# Patient Record
Sex: Male | Born: 1965 | Race: Black or African American | Hispanic: No | Smoking: Current some day smoker
Health system: Southern US, Community
[De-identification: ages and names within clinical notes are randomized; demographics above are authoritative.]

## PROBLEM LIST (undated history)

## (undated) DIAGNOSIS — I2510 Atherosclerotic heart disease of native coronary artery without angina pectoris: Secondary | ICD-10-CM

## (undated) DIAGNOSIS — E669 Obesity, unspecified: Secondary | ICD-10-CM

## (undated) DIAGNOSIS — G44009 Cluster headache syndrome, unspecified, not intractable: Secondary | ICD-10-CM

## (undated) DIAGNOSIS — E785 Hyperlipidemia, unspecified: Secondary | ICD-10-CM

## (undated) DIAGNOSIS — F1090 Alcohol use, unspecified, uncomplicated: Secondary | ICD-10-CM

## (undated) DIAGNOSIS — K589 Irritable bowel syndrome without diarrhea: Secondary | ICD-10-CM

## (undated) DIAGNOSIS — G43909 Migraine, unspecified, not intractable, without status migrainosus: Secondary | ICD-10-CM

## (undated) DIAGNOSIS — I10 Essential (primary) hypertension: Secondary | ICD-10-CM

## (undated) DIAGNOSIS — R000 Tachycardia, unspecified: Secondary | ICD-10-CM

## (undated) DIAGNOSIS — F209 Schizophrenia, unspecified: Secondary | ICD-10-CM

## (undated) DIAGNOSIS — F419 Anxiety disorder, unspecified: Secondary | ICD-10-CM

## (undated) DIAGNOSIS — G4733 Obstructive sleep apnea (adult) (pediatric): Secondary | ICD-10-CM

## (undated) DIAGNOSIS — K219 Gastro-esophageal reflux disease without esophagitis: Secondary | ICD-10-CM

## (undated) DIAGNOSIS — F32A Depression, unspecified: Secondary | ICD-10-CM

## (undated) DIAGNOSIS — F1910 Other psychoactive substance abuse, uncomplicated: Secondary | ICD-10-CM

## (undated) DIAGNOSIS — Z860100 Personal history of colon polyps, unspecified: Secondary | ICD-10-CM

## (undated) HISTORY — DX: Personal history of colon polyps, unspecified: Z86.0100

## (undated) HISTORY — DX: Gastro-esophageal reflux disease without esophagitis: K21.9

## (undated) HISTORY — DX: Irritable bowel syndrome, unspecified: K58.9

## (undated) HISTORY — DX: Obstructive sleep apnea (adult) (pediatric): G47.33

## (undated) HISTORY — DX: Atherosclerotic heart disease of native coronary artery without angina pectoris: I25.10

## (undated) HISTORY — DX: Schizophrenia, unspecified: F20.9

## (undated) HISTORY — DX: Hyperlipidemia, unspecified: E78.5

## (undated) HISTORY — DX: Depression, unspecified: F32.A

## (undated) HISTORY — DX: Anxiety disorder, unspecified: F41.9

## (undated) HISTORY — PX: WRIST SURGERY: SHX841

## (undated) HISTORY — PX: LOOP RECORDER IMPLANT: SHX5954

## (undated) HISTORY — DX: Alcohol use, unspecified, uncomplicated: F10.90

## (undated) HISTORY — PX: CARDIAC CATHETERIZATION: SHX172

---

## 2003-06-02 ENCOUNTER — Emergency Department (HOSPITAL_COMMUNITY): Admission: EM | Admit: 2003-06-02 | Discharge: 2003-06-02 | Payer: Self-pay | Admitting: Emergency Medicine

## 2004-11-15 ENCOUNTER — Emergency Department (HOSPITAL_COMMUNITY): Admission: EM | Admit: 2004-11-15 | Discharge: 2004-11-15 | Payer: Self-pay | Admitting: Emergency Medicine

## 2004-11-18 ENCOUNTER — Ambulatory Visit (HOSPITAL_COMMUNITY): Admission: RE | Admit: 2004-11-18 | Discharge: 2004-11-18 | Payer: Self-pay | Admitting: Family Medicine

## 2004-11-18 ENCOUNTER — Emergency Department (HOSPITAL_COMMUNITY): Admission: EM | Admit: 2004-11-18 | Discharge: 2004-11-18 | Payer: Self-pay | Admitting: Family Medicine

## 2006-04-09 ENCOUNTER — Emergency Department (HOSPITAL_COMMUNITY): Admission: EM | Admit: 2006-04-09 | Discharge: 2006-04-09 | Payer: Self-pay | Admitting: Emergency Medicine

## 2006-04-12 ENCOUNTER — Emergency Department (HOSPITAL_COMMUNITY): Admission: EM | Admit: 2006-04-12 | Discharge: 2006-04-12 | Payer: Self-pay | Admitting: Emergency Medicine

## 2006-07-14 ENCOUNTER — Encounter (INDEPENDENT_AMBULATORY_CARE_PROVIDER_SITE_OTHER): Payer: Self-pay | Admitting: Internal Medicine

## 2006-07-14 ENCOUNTER — Inpatient Hospital Stay (HOSPITAL_COMMUNITY): Admission: EM | Admit: 2006-07-14 | Discharge: 2006-07-15 | Payer: Self-pay | Admitting: Emergency Medicine

## 2006-08-31 ENCOUNTER — Emergency Department (HOSPITAL_COMMUNITY): Admission: EM | Admit: 2006-08-31 | Discharge: 2006-08-31 | Payer: Self-pay | Admitting: Emergency Medicine

## 2006-10-20 ENCOUNTER — Emergency Department (HOSPITAL_COMMUNITY): Admission: EM | Admit: 2006-10-20 | Discharge: 2006-10-20 | Payer: Self-pay | Admitting: Emergency Medicine

## 2006-11-01 ENCOUNTER — Encounter: Admission: RE | Admit: 2006-11-01 | Discharge: 2006-11-01 | Payer: Self-pay | Admitting: Family Medicine

## 2006-11-23 ENCOUNTER — Emergency Department (HOSPITAL_COMMUNITY): Admission: EM | Admit: 2006-11-23 | Discharge: 2006-11-23 | Payer: Self-pay | Admitting: Family Medicine

## 2006-12-06 ENCOUNTER — Ambulatory Visit (HOSPITAL_BASED_OUTPATIENT_CLINIC_OR_DEPARTMENT_OTHER): Admission: RE | Admit: 2006-12-06 | Discharge: 2006-12-06 | Payer: Self-pay | Admitting: Orthopedic Surgery

## 2007-01-19 ENCOUNTER — Emergency Department (HOSPITAL_COMMUNITY): Admission: EM | Admit: 2007-01-19 | Discharge: 2007-01-19 | Payer: Self-pay | Admitting: Emergency Medicine

## 2007-02-02 ENCOUNTER — Emergency Department (HOSPITAL_COMMUNITY): Admission: EM | Admit: 2007-02-02 | Discharge: 2007-02-02 | Payer: Self-pay | Admitting: Emergency Medicine

## 2007-02-21 ENCOUNTER — Emergency Department (HOSPITAL_COMMUNITY): Admission: EM | Admit: 2007-02-21 | Discharge: 2007-02-21 | Payer: Self-pay | Admitting: Emergency Medicine

## 2007-03-22 ENCOUNTER — Emergency Department (HOSPITAL_COMMUNITY): Admission: EM | Admit: 2007-03-22 | Discharge: 2007-03-22 | Payer: Self-pay | Admitting: Emergency Medicine

## 2007-07-18 ENCOUNTER — Emergency Department (HOSPITAL_COMMUNITY): Admission: EM | Admit: 2007-07-18 | Discharge: 2007-07-18 | Payer: Self-pay | Admitting: Emergency Medicine

## 2007-07-20 ENCOUNTER — Emergency Department (HOSPITAL_COMMUNITY): Admission: EM | Admit: 2007-07-20 | Discharge: 2007-07-21 | Payer: Self-pay | Admitting: Emergency Medicine

## 2007-07-25 ENCOUNTER — Emergency Department (HOSPITAL_COMMUNITY): Admission: EM | Admit: 2007-07-25 | Discharge: 2007-07-25 | Payer: Self-pay | Admitting: Family Medicine

## 2008-06-30 IMAGING — CT CT HEAD W/O CM
1 series · 16 of 30 positions shown, 20 images · non-contrast
Comparison: None

CLINICAL DATA: Left-sided weakness 

HEAD CT WITHOUT CONTRAST
TECHNIQUE: 5mm collimated images were obtained from the base of the skull
through the vertex according to standard protocol without contrast.

[Series 2: head routine 4.8 h47s · axial · 0.46mm/px · z∈[+1320,+1475]mm · 16 of 36 slices shown, 20 images]
[im 2/36  brain]
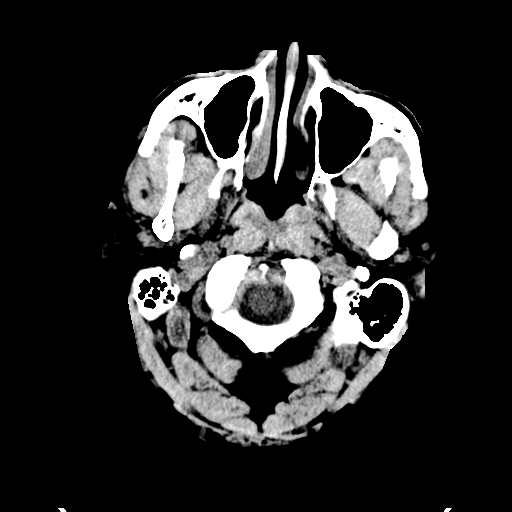
[im 2/36  bone]
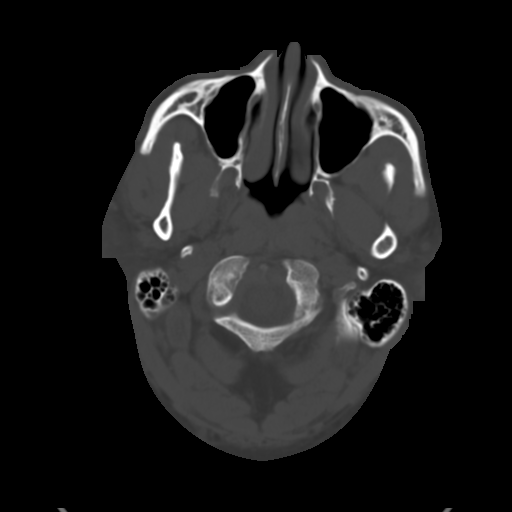
[im 4/36  brain]
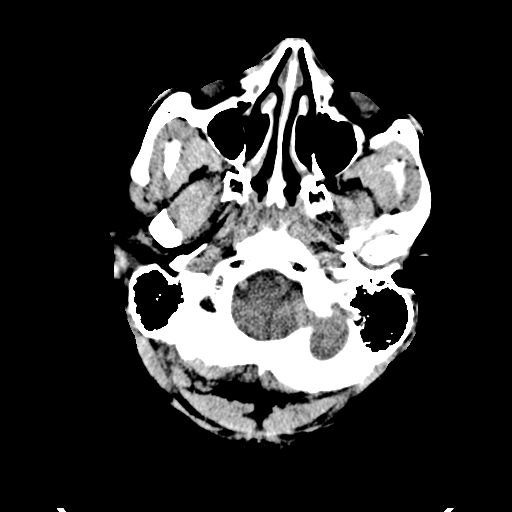
[im 7/36  brain]
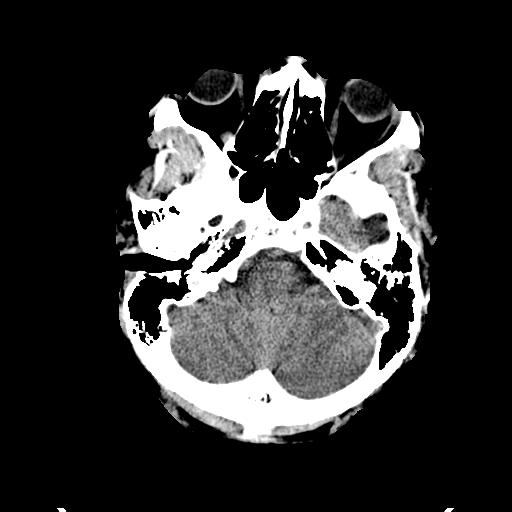
[im 9/36  brain]
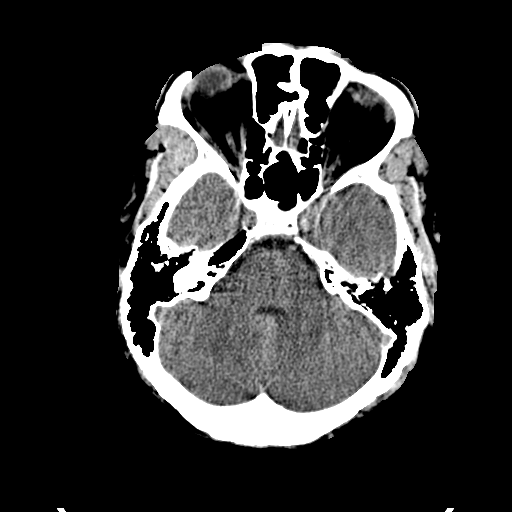
[im 10/36  brain]
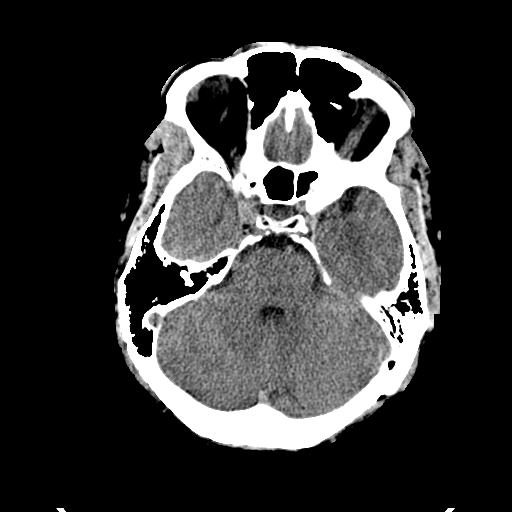
[im 10/36  bone]
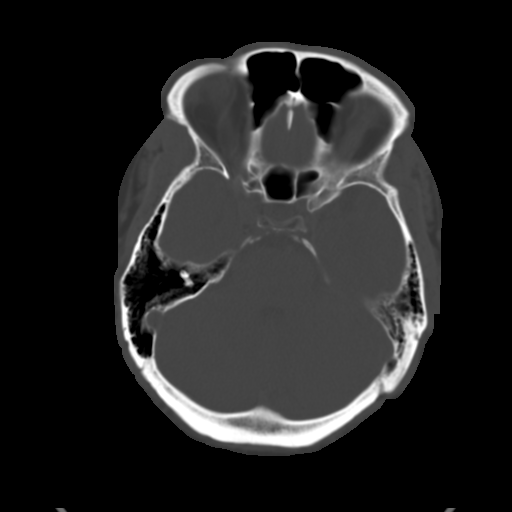
[im 13/36  brain]
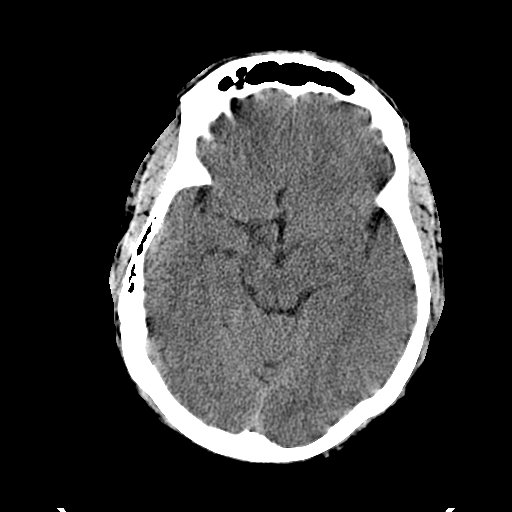
[im 15/36  brain]
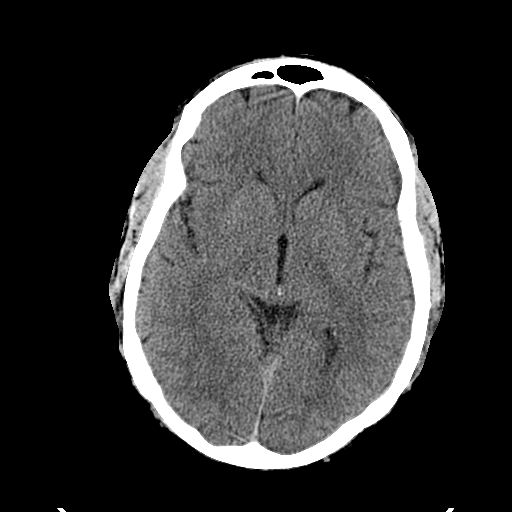
[im 17/36  brain]
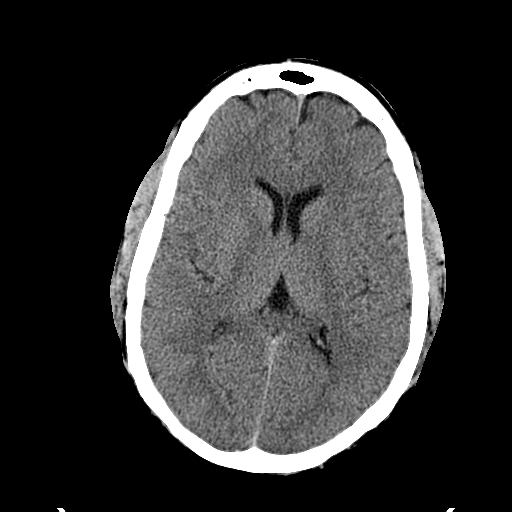
[im 19/36  brain]
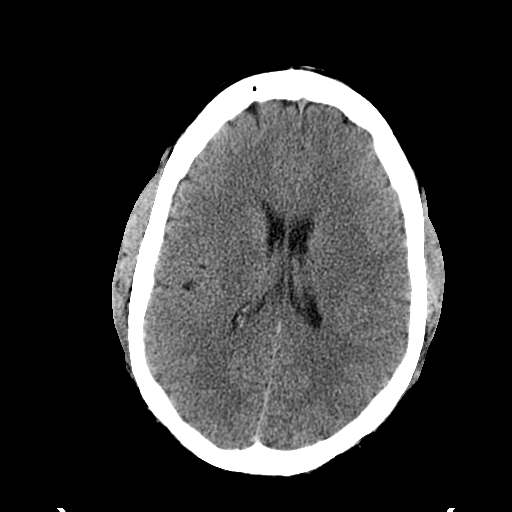
[im 19/36  bone]
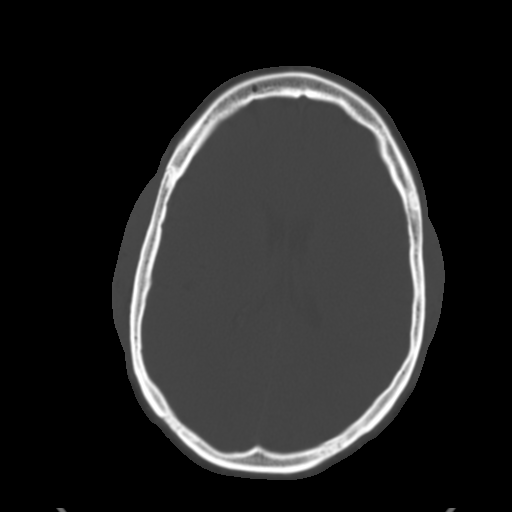
[im 21/36  brain]
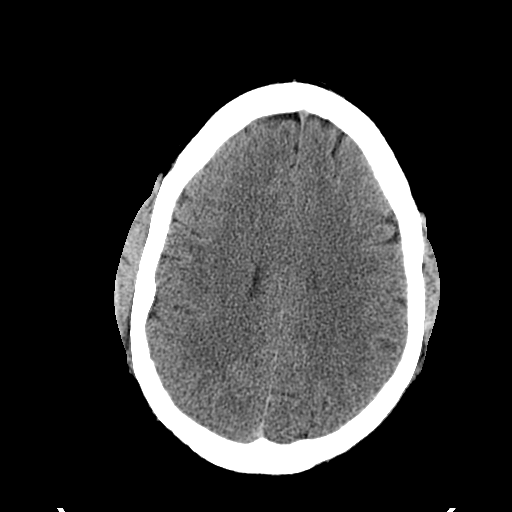
[im 23/36  brain]
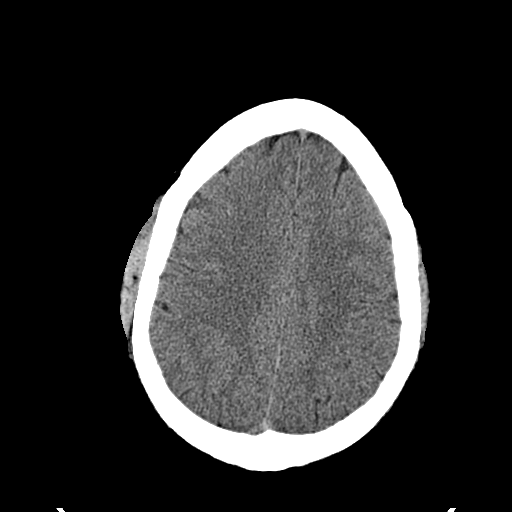
[im 26/36  brain]
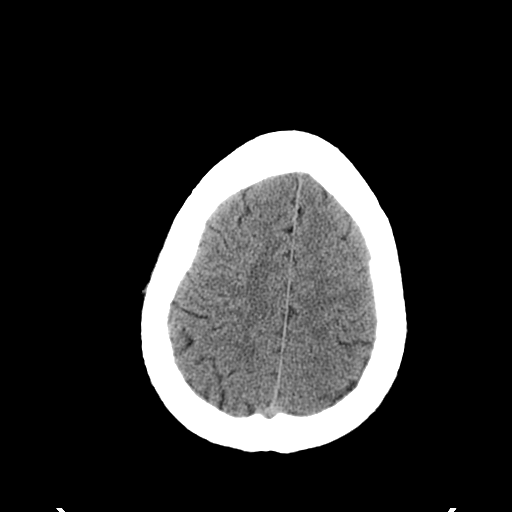
[im 27/36  brain]
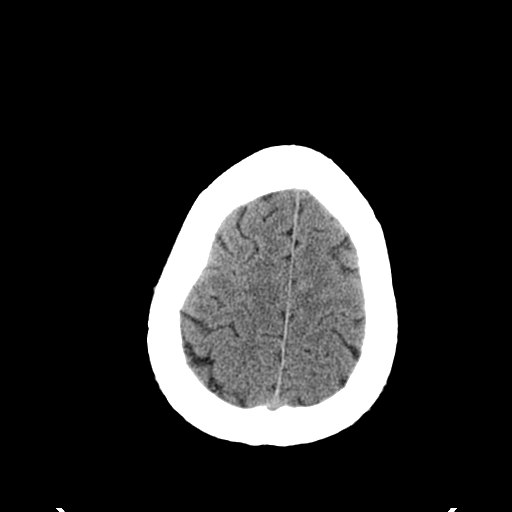
[im 27/36  bone]
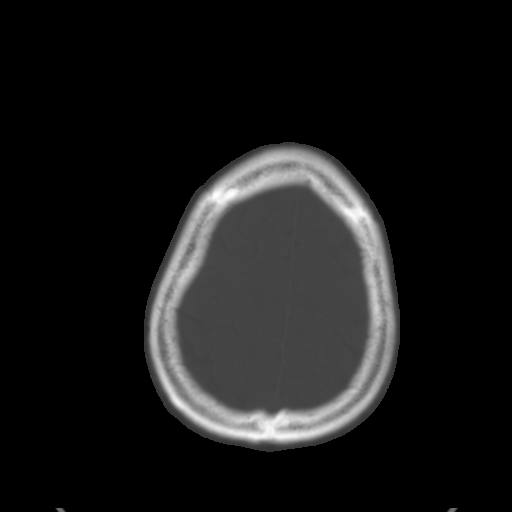
[im 29/36  brain]
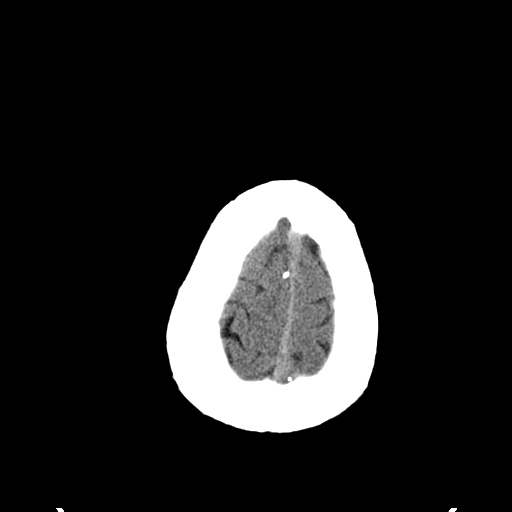
[im 32/36  brain]
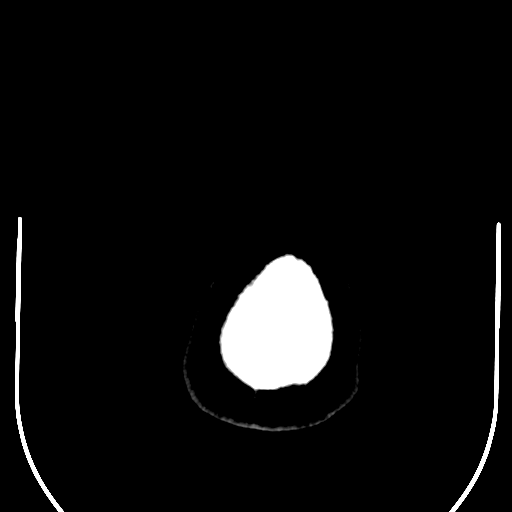
[im 34/36  brain]
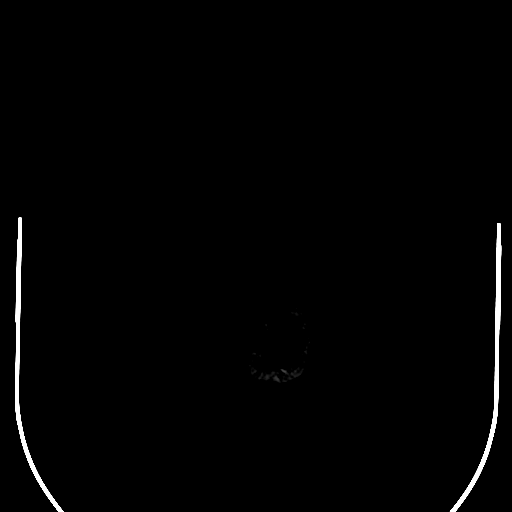

[16 of 30 positions shown; findings below may reference images not displayed]

FINDINGS: The cerebral and cerebellar hemispheres are normal in attenuation and
morphology.

The midline is maintained.

There is no edema or mass-effect.

No abnormal extra-axial fluid collections, intracranial hemorrhage or mass.

Paranasal sinuses and mastoid air cells are normally aerated.

The review of bone windows is unremarkable.

IMPRESSION

Normal brain.

## 2008-06-30 IMAGING — CR DG CHEST 1V PORT
1 series · 1 of 1 positions shown · non-contrast
Comparison: none

CLINICAL DATA: Chest pain

CHEST - 1 VIEW:

[view not recorded]
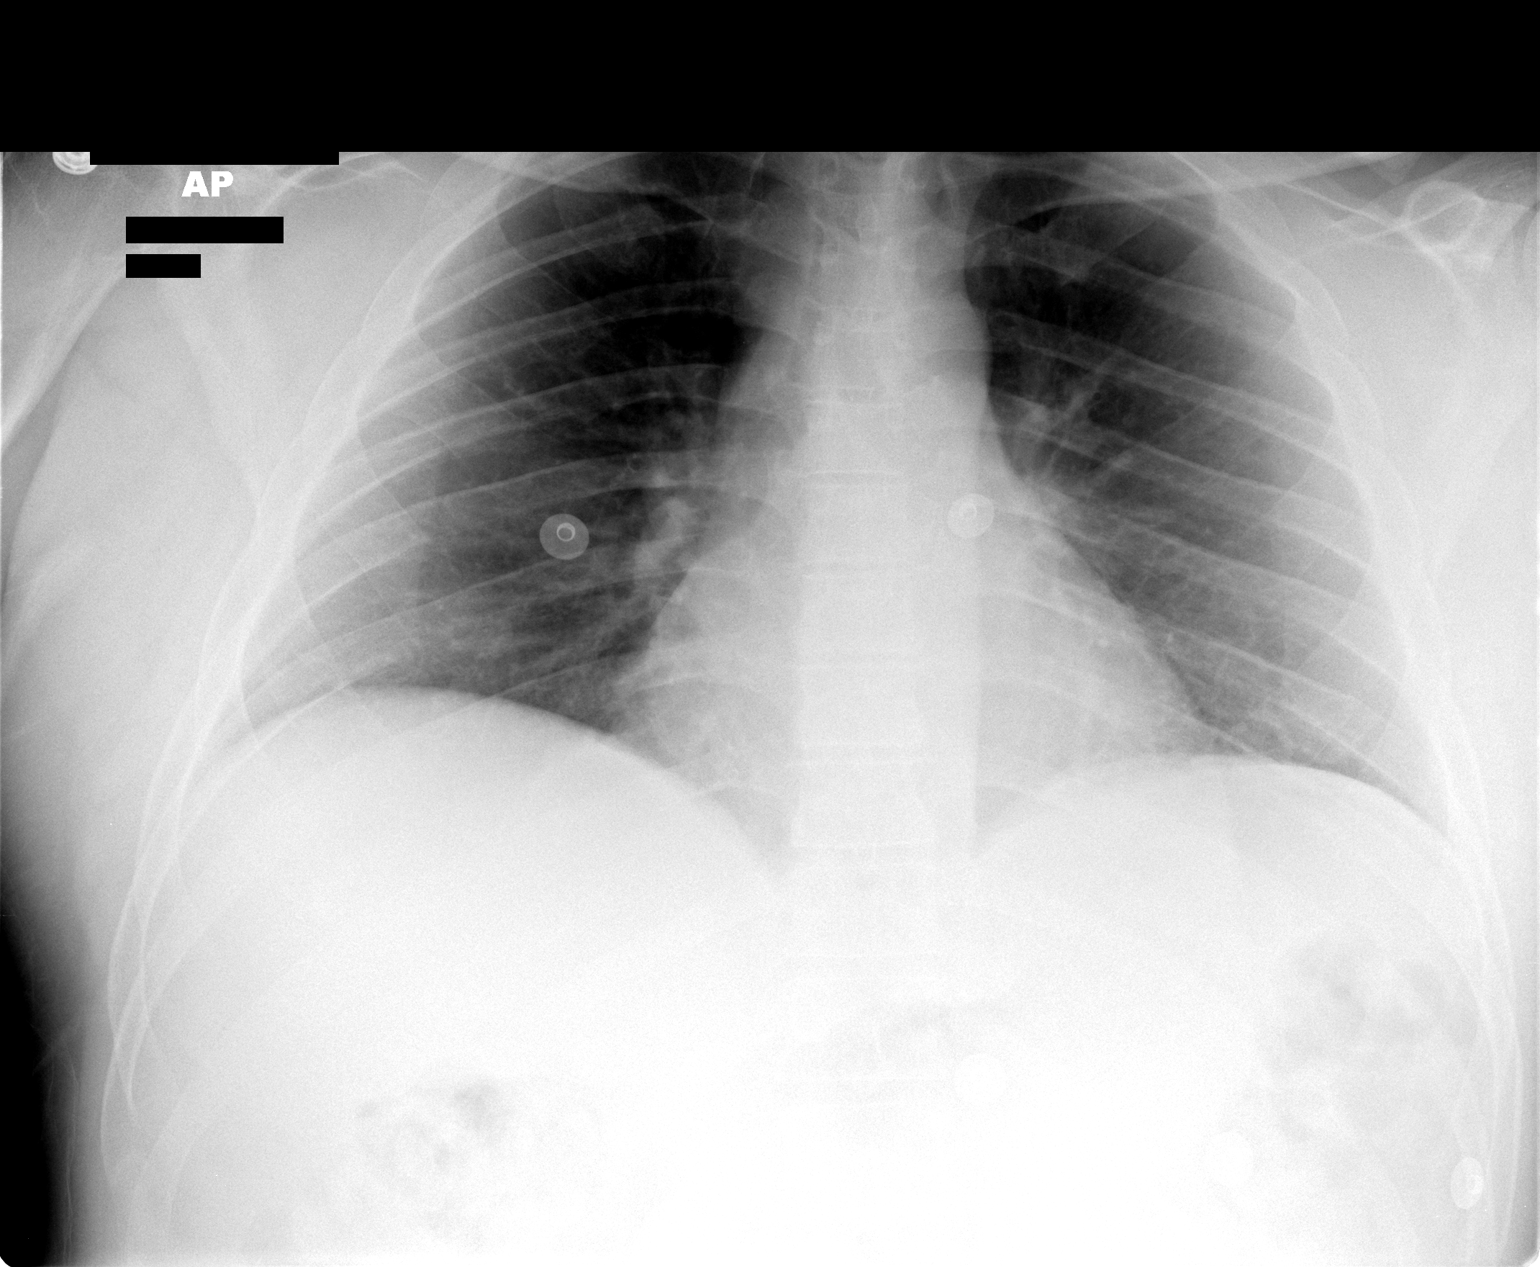

[1 of 1 positions shown; findings below may reference images not displayed]

FINDINGS: The heart size and mediastinal contours are within normal limits. 
Both lungs are clear.
IMPRESSION: No active disease.

## 2010-05-04 ENCOUNTER — Inpatient Hospital Stay (HOSPITAL_COMMUNITY)
Admission: RE | Admit: 2010-05-04 | Discharge: 2010-05-04 | Disposition: A | Discharge status: Home / Self Care | Payer: Self-pay | Source: Ambulatory Visit | Attending: Family Medicine | Admitting: Family Medicine

## 2010-05-11 ENCOUNTER — Inpatient Hospital Stay (INDEPENDENT_AMBULATORY_CARE_PROVIDER_SITE_OTHER)
Admission: RE | Admit: 2010-05-11 | Discharge: 2010-05-11 | Disposition: A | Discharge status: Home / Self Care | Payer: Self-pay | Source: Ambulatory Visit | Attending: Emergency Medicine | Admitting: Emergency Medicine

## 2010-05-11 DIAGNOSIS — G43109 Migraine with aura, not intractable, without status migrainosus: Secondary | ICD-10-CM

## 2010-05-13 ENCOUNTER — Emergency Department (HOSPITAL_COMMUNITY)
Admission: EM | Admit: 2010-05-13 | Discharge: 2010-05-13 | Disposition: A | Discharge status: Home / Self Care | Payer: Self-pay | Attending: Emergency Medicine | Admitting: Emergency Medicine

## 2010-05-13 DIAGNOSIS — X58XXXA Exposure to other specified factors, initial encounter: Secondary | ICD-10-CM | POA: Insufficient documentation

## 2010-05-13 DIAGNOSIS — M546 Pain in thoracic spine: Secondary | ICD-10-CM | POA: Insufficient documentation

## 2010-05-13 DIAGNOSIS — R300 Dysuria: Secondary | ICD-10-CM | POA: Insufficient documentation

## 2010-05-13 DIAGNOSIS — I10 Essential (primary) hypertension: Secondary | ICD-10-CM | POA: Insufficient documentation

## 2010-05-13 DIAGNOSIS — S239XXA Sprain of unspecified parts of thorax, initial encounter: Secondary | ICD-10-CM | POA: Insufficient documentation

## 2010-05-13 DIAGNOSIS — R109 Unspecified abdominal pain: Secondary | ICD-10-CM | POA: Insufficient documentation

## 2010-05-13 LAB — URINALYSIS, ROUTINE W REFLEX MICROSCOPIC
Bilirubin Urine: NEGATIVE
Hgb urine dipstick: NEGATIVE
Nitrite: NEGATIVE
Protein, ur: NEGATIVE mg/dL
Specific Gravity, Urine: 1.027 (ref 1.005–1.030)
Urobilinogen, UA: 1 mg/dL (ref 0.0–1.0)

## 2010-05-13 LAB — POCT I-STAT, CHEM 8
BUN: 16 mg/dL (ref 6–23)
Chloride: 104 mEq/L (ref 96–112)
Creatinine, Ser: 1.2 mg/dL (ref 0.4–1.5)
Glucose, Bld: 101 mg/dL — ABNORMAL HIGH (ref 70–99)
HCT: 48 % (ref 39.0–52.0)
Hemoglobin: 16.3 g/dL (ref 13.0–17.0)
Potassium: 4 mEq/L (ref 3.5–5.1)
Sodium: 143 mEq/L (ref 135–145)
TCO2: 30 mmol/L (ref 0–100)

## 2010-06-16 NOTE — H&P (Signed)
NAME:  Raymond Mcclure, Raymond Mcclure                ACCOUNT NO.:  [id number]   MEDICAL RECORD NO.:  [id number]          PATIENT TYPE:  INP   LOCATION:  3731                         FACILITY:  [facility]   PHYSICIAN:  Herbie Saxon, MDDATE OF BIRTH:  [birth year]   DATE OF ADMISSION:  07/13/2006  DATE OF DISCHARGE:                              HISTORY & PHYSICAL   PRIMARY CARE PHYSICIAN:  Unassigned.   PRESENTING COMPLAINT:  Chest pain 1 day duration.   HISTORY OF PRESENTING COMPLAINT:  This is a 45 year old African American  male who woke up at 9 p.m. yesterday night with severe, 10/10, dull,  retrosternal chest pain, radiating to the left arm and neck, associated  with numbness of the left side.  There was associated nausea,  diaphoresis, lightheadedness, weakness.  Patient has noticed increase in  shortness of breath on exertion over the last 4 weeks, but denies any  body swelling.  He has not had any syncopal episodes.  When the pain  started, he had some palpitations.  He presented to the emergency room  where he was given nitroglycerin and aspirin.  His chest pain has  subsided at present, 1/10.  He states that the chest pain was  intermittent.  He had not had any past history of cardiac problems.  No  cough.  However, he has reflux symptoms intermittently, which he had  been using Zantac for.  Patient has also noticed nocturia in the last 3  weeks.  He has been having to wake up to pass urine about 3-4 times at  night.  No dysuria.  No hematuria.  No frothy urine.   FAMILY HISTORY:  Father had coronary artery disease, bone cancer and  diabetes.  Mother had diabetes.  Maternal aunts and uncles had CVA.   SOCIAL HISTORY:  He is married with 7 children.  He smokes 1/4 pack per  day for more than 15 years.  There is no history of alcohol use.  He  denies any history of drug use.  He works at a group home.   ALLERGIES:  NO KNOWN DRUG ALLERGIES.   MEDICATIONS:  Zantac.   REVIEW OF SYSTEMS:   Twelve systems pertinent positive as above.   PHYSICAL EXAMINATION:  On examination:  GENERAL:  He is a young man, obese, not in acute respiratory distress.  VITAL SIGNS:  Temperature is 98.  Pulse is 81.  Respiratory rate is 20.  Blood pressure 134/79.  HEENT:  Pupils equal and reactive to light and accommodation.  Extraocular muscles are intact.  Oropharynx and his pharynx are clear.  Head is atraumatic, normocephalic.  NECK:  Supple.  No carotid bruit.  No thyromegaly.  No elevated JVD.  CHEST:  Clinically clear.  HEART:  Sounds 1 and 2 regular.  No murmur.  ABDOMEN:  Soft, nontender.  Bowel sounds normoactive.  No organomegaly.  Inguinal orifices are intact.  EXTREMITIES:  Peripheral pulses present  no pedal edema.  No skin rash.  No joint swelling.  NEUROLOGIC:  He is alert and oriented x3.  Cranial nerves II-XII intact.  Power is 5/5  globally.  Sensation is within normal.   AVAILABLE LABS:  WBC is 11.1, hematocrit is 40.0, platelets is 272.  Chemistries:  Sodium is 140, potassium 3.5, chloride 109, bicarbonate  21, BUN 13, creatinine 1.0, glucose is 105.  Troponin is less than 0.05.  D-dimer is 0.23.  EKG shows sinus arrhythmia at 70 per minute with  nonspecific ST/T changes.   IMPRESSION:  1. Chest pain, rule out acute coronary failure.  2. Gastroesophageal reflux disease.  3. Tobacco abuse.  4. Family history of coronary artery disease.  5. Elevated white blood cell count reactive versus a clot infection.  6. Rule out acute on chronic bronchitis.  7. Obesity.   Patient is to be admitted for observation to telemetry.   DIET:  Should be cardiac.   ACTIVITY:  Bed rest.   IV fluid 1/2 normal saline at 20 mL an hour.  He is to have the thyroid  function test, fasting lipid panel, homocystine and serial cardiac  enzymes and an EKG q.8 hours x3.  We checked his 2D echo, possible  schedule for stress test.  Consider cardiology evaluation in the  morning.  We will put him on  Lovenox 40 mg IV subcu daily for deep  venous thrombosis prophylaxis, Phenergan 12.5 mg IV q.8 hours p.r.n.,  enteric-coated aspirin 325 mg daily, Protonix 40 mg IV daily, Duo-Nebs  treatment q.6 hours p.r.n., morphine 2 mg IV q.6 hours p.r.n., O2 2  liter nasal cannula to keep pulse oximetry between 92, Lopressor 2.5 mg  IV q.6 hours p.r.n. if blood pressure greater than 150/109 or heart rate  greater than 109, nitropaste 1/2 inch q.6 hours, Motrin 400 mg q.8 hours  p.r.n., Xanax 0.25 mg p.o. b.i.d., nicotine patch 40 mg per day.  Tobacco cessation counseling.  New medication list and tests explained  to patient.  He verbalizes understanding.      Herbie Saxon, MD  Electronically Signed     MIO/MEDQ  D:  07/14/2006  T:  07/14/2006  Job:  [id number]
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
## 2010-06-16 NOTE — Consult Note (Signed)
NAME:  Raymond Mcclure, Raymond Mcclure NO.:  [id number]   MEDICAL RECORD NO.:  [id number]          PATIENT TYPE:  INP   LOCATION:  3731                         FACILITY:  [facility]   PHYSICIAN:  Peter M. Swaziland, M.D.  DATE OF BIRTH:  [birth year]   DATE OF CONSULTATION:  07/14/2006  DATE OF DISCHARGE:                                 CONSULTATION   HISTORY OF PRESENT ILLNESS:  The patient is a 45 year old black male who  is admitted yesterday evening for evaluation of chest pain.  He states  the pain was in the midsternal area described as a tightness and dull  pain radiating up into his throat and neck. He also had pulsating  sensation in his left arm and leg associated with numbness. Had no  nausea, vomiting, diaphoresis or shortness of breath.  He also  complained of pain around his left orbit and the posterior aspect of his  neck.  There was no change in position, cough or deep breathing. All his  symptoms occurred at rest.  He was admitted last night.  He is ruled out  for myocardial infarction.  However, today he had recurrent chest pain.  He rated as 10/10.  He states it seemed to abate after he was started on  IV nitroglycerin.  He is currently pain free. The patient's primary risk  factors including his family history of heart disease and history of  tobacco use.   PAST MEDICAL HISTORY:  Is significant for gastroesophageal reflux  disease.  He states his blood pressure has been borderline.   ALLERGIES:  He has no known allergies.   PRIOR MEDICATIONS:  Include Zantac.   SOCIAL HISTORY:  The patient is married.  He has seven children.  He  smokes a quarter pack per day.  He denies alcohol or drug use.  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 His wife works at [employer] as an Engineer, production.   FAMILY HISTORY:  Father had history of coronary disease, myocardial  infarction.  Mother has a history of diabetes.  He does have several  aunts and uncles who have had strokes.   REVIEW OF  SYSTEMS:  Is otherwise unremarkable.   PHYSICAL EXAM:  GENERAL:  The patient is young black male in no apparent  distress.  VITAL SIGNS:  His blood pressure is 117/69, pulse 67 and regular.  He is  afebrile and his saturations are 98% on room air.  HEENT:  Unremarkable.  He has no jugular venous distension or bruits.  LUNGS:  Clear.  CARDIAC:  Exam reveals regular rate and rhythm without gallop, murmur,  rub or click.  ABDOMEN:  Soft, nontender.  He has no hepatosplenomegaly, masses or  bruits.  Femoral and pedal pulses 2+ and symmetric.  He has no edema or  phlebitis.  NEUROLOGIC:  Exam is nonfocal.  Do not elicit any chest wall tenderness  to palpation.   LABORATORY DATA:  He had a head CT on admission which was normal.  CHEST  X-RAY:  Shows no active disease.  He has had multiple cardiac enzymes  done which were all normal. D-dimer level was 0.25 and BMET was normal  except for glucose of 110.  CBC was normal.  TSH was normal.  BNP level  was less than 30.   IMPRESSION:  1. Atypical chest pain.  2. Family history of coronary disease.  3. History of tobacco use.  4. Gastroesophageal reflux disease.   PLAN:  Would recommend checking a lipid panel. The patient had an  echocardiogram today.  We will review these results.  We will schedule  him for a stress Cardiolite study in the morning to rule out significant  coronary disease although I think it is unlikely his symptoms or cardiac  related.           ______________________________  Peter M. Sw[place], M.D.     PMJ/MEDQ  D:  07/14/2006  T:  07/15/2006  Job:  [id number]   cc:   Herbie Saxon, MD

## 2010-08-27 ENCOUNTER — Emergency Department (HOSPITAL_COMMUNITY)
Admission: EM | Admit: 2010-08-27 | Discharge: 2010-08-28 | Disposition: A | Discharge status: Home / Self Care | Payer: Self-pay | Attending: Emergency Medicine | Admitting: Emergency Medicine

## 2010-08-27 DIAGNOSIS — M79609 Pain in unspecified limb: Secondary | ICD-10-CM | POA: Insufficient documentation

## 2010-08-27 DIAGNOSIS — I10 Essential (primary) hypertension: Secondary | ICD-10-CM | POA: Insufficient documentation

## 2010-08-27 DIAGNOSIS — R070 Pain in throat: Secondary | ICD-10-CM | POA: Insufficient documentation

## 2010-08-27 DIAGNOSIS — IMO0001 Reserved for inherently not codable concepts without codable children: Secondary | ICD-10-CM | POA: Insufficient documentation

## 2010-08-27 DIAGNOSIS — R05 Cough: Secondary | ICD-10-CM | POA: Insufficient documentation

## 2010-08-27 DIAGNOSIS — R630 Anorexia: Secondary | ICD-10-CM | POA: Insufficient documentation

## 2010-08-27 DIAGNOSIS — R059 Cough, unspecified: Secondary | ICD-10-CM | POA: Insufficient documentation

## 2010-08-27 DIAGNOSIS — R209 Unspecified disturbances of skin sensation: Secondary | ICD-10-CM | POA: Insufficient documentation

## 2010-08-27 LAB — URINALYSIS, ROUTINE W REFLEX MICROSCOPIC
Bilirubin Urine: NEGATIVE
Glucose, UA: NEGATIVE mg/dL
Ketones, ur: NEGATIVE mg/dL
Nitrite: NEGATIVE
Protein, ur: NEGATIVE mg/dL
Specific Gravity, Urine: 1.032 — ABNORMAL HIGH (ref 1.005–1.030)
Urobilinogen, UA: 1 mg/dL (ref 0.0–1.0)

## 2010-08-27 LAB — URINE MICROSCOPIC-ADD ON

## 2010-08-28 LAB — CBC
HCT: 39.1 % (ref 39.0–52.0)
Hemoglobin: 13.2 g/dL (ref 13.0–17.0)
MCH: 30.4 pg (ref 26.0–34.0)
MCHC: 33.8 g/dL (ref 30.0–36.0)
MCV: 90.1 fL (ref 78.0–100.0)
Platelets: 256 10*3/uL (ref 150–400)
RBC: 4.34 MIL/uL (ref 4.22–5.81)
RDW: 12.2 % (ref 11.5–15.5)
WBC: 8.6 10*3/uL (ref 4.0–10.5)

## 2010-08-28 LAB — DIFFERENTIAL
Eosinophils Absolute: 0.2 10*3/uL (ref 0.0–0.7)
Eosinophils Relative: 3 % (ref 0–5)
Lymphocytes Relative: 39 % (ref 12–46)
Lymphs Abs: 3.4 10*3/uL (ref 0.7–4.0)
Monocytes Absolute: 0.7 10*3/uL (ref 0.1–1.0)
Monocytes Relative: 8 % (ref 3–12)
Neutro Abs: 4.3 10*3/uL (ref 1.7–7.7)
Neutrophils Relative %: 50 % (ref 43–77)

## 2010-08-28 LAB — CK: Total CK: 345 U/L — ABNORMAL HIGH (ref 7–232)

## 2010-08-28 LAB — BASIC METABOLIC PANEL
CO2: 23 mEq/L (ref 19–32)
Calcium: 9.2 mg/dL (ref 8.4–10.5)
GFR calc non Af Amer: >60 mL/min (ref >60)
Glucose, Bld: 99 mg/dL (ref 70–99)
Potassium: 3.7 mEq/L (ref 3.5–5.1)
Sodium: 137 mEq/L (ref 135–145)

## 2010-10-29 LAB — URINALYSIS, ROUTINE W REFLEX MICROSCOPIC
Ketones, ur: 15 — AB
Nitrite: NEGATIVE
Specific Gravity, Urine: 1.04 — ABNORMAL HIGH
Urobilinogen, UA: 1

## 2010-10-29 LAB — CULTURE, ROUTINE-ABSCESS

## 2010-10-29 LAB — RAPID STREP SCREEN (MED CTR MEBANE ONLY): Streptococcus, Group A Screen (Direct): NEGATIVE

## 2010-11-10 LAB — POCT HEMOGLOBIN-HEMACUE: Hemoglobin: 15.5

## 2010-11-19 LAB — POCT CARDIAC MARKERS
CKMB, poc: 1.2
Myoglobin, poc: 89.5
Operator id: 189501
Troponin i, poc: <0.05

## 2010-11-19 LAB — D-DIMER, QUANTITATIVE
D-Dimer, Quant: 0.23
D-Dimer, Quant: 0.25

## 2010-11-19 LAB — CBC
HCT: 40.5
MCHC: 33.3
MCHC: 33.8
MCHC: 34
MCV: 94.6
MCV: 94.9
Platelets: 261
Platelets: 272
RBC: 4.22
RDW: 12.9
WBC: 8.3

## 2010-11-19 LAB — COMPREHENSIVE METABOLIC PANEL
ALT: 24
Alkaline Phosphatase: 86
Chloride: 110
Glucose, Bld: 86
Potassium: 4
Sodium: 142
Total Protein: 5.7 — ABNORMAL LOW

## 2010-11-19 LAB — CARDIAC PANEL(CRET KIN+CKTOT+MB+TROPI)
CK, MB: 2
Relative Index: 1
Total CK: 191
Total CK: 211
Troponin I: 0.01
Troponin I: 0.03

## 2010-11-19 LAB — BASIC METABOLIC PANEL
Calcium: 8.9
Chloride: 106
Creatinine, Ser: 1.01
GFR calc Af Amer: >60

## 2010-11-19 LAB — I-STAT 8, (EC8 V) (CONVERTED LAB)
Acid-base deficit: 2
BUN: 13
Bicarbonate: 21.7
HCT: 41
Operator id: 189501
TCO2: 23
pCO2, Ven: 33.1 — ABNORMAL LOW

## 2010-11-19 LAB — POCT I-STAT CREATININE: Creatinine, Ser: 1

## 2010-11-19 LAB — MAGNESIUM: Magnesium: 2

## 2010-11-19 LAB — TROPONIN I: Troponin I: 0.03

## 2010-11-19 LAB — CK TOTAL AND CKMB (NOT AT ARMC): CK, MB: 3

## 2010-11-19 LAB — DIFFERENTIAL
Basophils Absolute: 0.1
Basophils Relative: 1
Eosinophils Absolute: 0.2
Eosinophils Relative: 2
Neutrophils Relative %: 59

## 2010-11-19 LAB — HOMOCYSTEINE: Homocysteine: 8.3

## 2010-11-19 LAB — B-NATRIURETIC PEPTIDE (CONVERTED LAB): Pro B Natriuretic peptide (BNP): <30

## 2010-11-19 LAB — LIPID PANEL
Cholesterol: 147
Total CHOL/HDL Ratio: 5.3

## 2010-11-19 LAB — APTT: aPTT: 31

## 2012-10-21 ENCOUNTER — Encounter (HOSPITAL_COMMUNITY): Payer: Self-pay | Admitting: Emergency Medicine

## 2012-10-21 ENCOUNTER — Emergency Department (HOSPITAL_COMMUNITY)
Admission: EM | Admit: 2012-10-21 | Discharge: 2012-10-21 | Disposition: A | Discharge status: Home / Self Care | Payer: Self-pay | Attending: Emergency Medicine | Admitting: Emergency Medicine

## 2012-10-21 DIAGNOSIS — M542 Cervicalgia: Secondary | ICD-10-CM | POA: Insufficient documentation

## 2012-10-21 DIAGNOSIS — F17200 Nicotine dependence, unspecified, uncomplicated: Secondary | ICD-10-CM | POA: Insufficient documentation

## 2012-10-21 DIAGNOSIS — K118 Other diseases of salivary glands: Secondary | ICD-10-CM | POA: Insufficient documentation

## 2012-10-21 DIAGNOSIS — Z79899 Other long term (current) drug therapy: Secondary | ICD-10-CM | POA: Insufficient documentation

## 2012-10-21 MED ORDER — NAPROXEN 500 MG PO TABS: 500.0000 mg | ORAL_TABLET | Freq: Two times a day (BID) | ORAL | Status: DC

## 2012-10-21 MED ORDER — DIPHENHYDRAMINE HCL 25 MG PO CAPS: 25.0000 mg | ORAL_CAPSULE | Freq: Once | ORAL | Status: DC

## 2012-10-21 MED ORDER — HYDROCODONE-ACETAMINOPHEN 5-325 MG PO TABS: ORAL_TABLET | ORAL | Status: DC

## 2012-10-21 MED ORDER — FAMOTIDINE 20 MG PO TABS: 20.0000 mg | ORAL_TABLET | Freq: Once | ORAL | Status: DC

## 2012-10-21 MED ORDER — AMOXICILLIN-POT CLAVULANATE 875-125 MG PO TABS: 1.0000 | ORAL_TABLET | Freq: Two times a day (BID) | ORAL | Status: DC

## 2012-10-21 NOTE — ED Provider Notes (Signed)
CSN: [id number]     Arrival date & time 10/21/12  0714 History   First MD Initiated Contact with Patient 10/21/12 0720     Chief Complaint  Patient presents with  . Facial Swelling   (Consider location/radiation/quality/duration/timing/severity/associated sxs/prior Treatment) HPI Comments: Pt with h/o allergy to tramadol, no h/o DM, on no HTN meds -- presents with L sided neck swelling. He first noticed last evening. Describes swelling as feeling sore. States he was having some problems swallowing (no regurgitation) and breathing last night but he drank some warm tea that allowed him to sleep. After eating fruit this morning, he felt like the swelling and soreness was acutely worse so he came to the ED for eval. No pain with movement of neck. No history of similar allergy. No fever, N/V. No CP. The onset of this condition was acute. The course is constant. Aggravating factors: none. Alleviating factors: none.    The history is provided by the patient.    History reviewed. No pertinent past medical history. History reviewed. No pertinent past surgical history. No family history on file. History  Substance Use Topics  . Smoking status: Current Every Day Smoker  . Smokeless tobacco: Not on file  . Alcohol Use: No    Review of Systems  Constitutional: Negative for fever.  HENT: Positive for neck pain (sore). Negative for facial swelling and trouble swallowing.   Eyes: Negative for redness.  Respiratory: Negative for shortness of breath, wheezing and stridor.   Cardiovascular: Negative for chest pain.  Gastrointestinal: Negative for nausea and vomiting.  Musculoskeletal: Negative for myalgias.  Skin: Negative for rash.  Neurological: Negative for light-headedness.  Psychiatric/Behavioral: Negative for confusion.    Allergies  Tramadol  Home Medications   Current Outpatient Rx  Name  Route  Sig  Dispense  Refill  . amoxicillin-clavulanate (AUGMENTIN) 875-125 MG per tablet  Oral   Take 1 tablet by mouth every 12 (twelve) hours.   14 tablet   0   . HYDROcodone-acetaminophen (NORCO/VICODIN) 5-325 MG per tablet      Take 1-2 tablets every 6 hours as needed for severe pain   6 tablet   0   . naproxen (NAPROSYN) 500 MG tablet   Oral   Take 1 tablet (500 mg total) by mouth 2 (two) times daily.   20 tablet   0    BP 127/88  Pulse 88  Temp(Src) 97.5 F (36.4 C) (Oral)  Resp 20  SpO2 98% Physical Exam  Nursing note and vitals reviewed. Constitutional: He appears well-developed and well-nourished.  HENT:  Head: Normocephalic and atraumatic.  Eyes: Conjunctivae are normal. Right eye exhibits no discharge. Left eye exhibits no discharge.  Neck: Normal range of motion. Neck supple.  Fullness and mild tenderness to left upper neck and inferior to L jaw. No overlying erythema. Full ROM of neck.   Cardiovascular: Normal rate, regular rhythm and normal heart sounds.   Pulmonary/Chest: Effort normal and breath sounds normal. No stridor.  Abdominal: Soft. There is no tenderness.  Neurological: He is alert.  Skin: Skin is warm and dry.  Psychiatric: He has a normal mood and affect.    ED Course  Procedures (including critical care time) Labs Review Labs Reviewed - No data to display Imaging Review No results found.  7:30 AM Patient seen and examined. Work-up initiated. Suspected swollen salivary gland.   Vital signs reviewed and are as follows: Filed Vitals:   10/21/12 0719  BP: 127/88  Pulse: 88  Temp: 97.5 F (36.4 C)  Resp: 20   8:10 AM Pt stable. Discussed with and seen by Dr. Fayrene Fearing who agrees with assessment. Pt counseled on eating sour candies and likely slow resolution of symptoms. Will discharge home with pain medication, Augmentin, naproxen, ENT followup if not improved in one week.  Patient told to return with trouble breathing or inability to swallow.  MDM   1. Salivary gland obstruction    Patient with swollen salivary gland,  tenderness to palpation, painful swallowing and chewing. Do not suspect allergic reaction, anaphylaxis. He is in no respiratory distress and can swallow without difficulty in emergency department. Conservative management indicated. No systemic signs of infection however will give course of Augmentin as infection cannot be ruled out as causing obstruction. No concern for Ludwig's angina or retropharyngeal abscess given history, risk factors, and normal movement of neck.    Renne Crigler, PA-C 10/21/12 1141

## 2012-10-21 NOTE — ED Provider Notes (Signed)
Pt seen and examined.  Pain and swelling below lt jaw for 2 days.  Exam shows well localized tenderness over palpable sub-mandibular gland.  No central anterior neck pain.  Comfortable breathing.  Hurts to chew and swallow. Agree with Dx and treatment Sialadenitis.  Roney Marion, MD 10/21/12 [phone]

## 2012-10-22 NOTE — ED Provider Notes (Signed)
Medical screening examination/treatment/procedure(s) were conducted as a shared visit with non-physician practitioner(s) and myself.  I personally evaluated the patient during the encounter  Patient reports symptoms over the last few days. Has a tender swollen area just underneath the ramus of the mandible slightly anterior. His exam is consistent with a maxillary sialoadenitis. Treatment  will be Pain medicines, sialagogues, antibiotics.  Roney Marion, MD 10/22/12 [phone]

## 2012-12-04 ENCOUNTER — Emergency Department (HOSPITAL_COMMUNITY)
Admission: EM | Admit: 2012-12-04 | Discharge: 2012-12-04 | Disposition: A | Discharge status: Home / Self Care | Payer: Self-pay | Attending: Emergency Medicine | Admitting: Emergency Medicine

## 2012-12-04 ENCOUNTER — Encounter (HOSPITAL_COMMUNITY): Payer: Self-pay | Admitting: Emergency Medicine

## 2012-12-04 DIAGNOSIS — X12XXXA Contact with other hot fluids, initial encounter: Secondary | ICD-10-CM | POA: Insufficient documentation

## 2012-12-04 DIAGNOSIS — R209 Unspecified disturbances of skin sensation: Secondary | ICD-10-CM | POA: Insufficient documentation

## 2012-12-04 DIAGNOSIS — Y9389 Activity, other specified: Secondary | ICD-10-CM | POA: Insufficient documentation

## 2012-12-04 DIAGNOSIS — Y929 Unspecified place or not applicable: Secondary | ICD-10-CM | POA: Insufficient documentation

## 2012-12-04 DIAGNOSIS — T23209A Burn of second degree of unspecified hand, unspecified site, initial encounter: Secondary | ICD-10-CM | POA: Insufficient documentation

## 2012-12-04 DIAGNOSIS — F17200 Nicotine dependence, unspecified, uncomplicated: Secondary | ICD-10-CM | POA: Insufficient documentation

## 2012-12-04 DIAGNOSIS — T23202A Burn of second degree of left hand, unspecified site, initial encounter: Secondary | ICD-10-CM

## 2012-12-04 MED ORDER — SILVER SULFADIAZINE 1 % EX CREA
TOPICAL_CREAM | Freq: Once | CUTANEOUS | Status: AC
  Administered 2012-12-04: 1 via TOPICAL
  Filled 2012-12-04: qty 85
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
## 2012-12-04 NOTE — ED Provider Notes (Signed)
CSN: [id number]     Arrival date & time 12/04/12  1627 History  This chart was scribed for non-physician practitioner, Sharilyn Sites, PA-C working with Richardean Canal, MD by Greggory Stallion, ED scribe. This patient was seen in room TR07C/TR07C and the patient's care was started at 6:35 PM.   Chief Complaint  Patient presents with  . Hand Burn   The history is provided by the patient. No language interpreter was used.   HPI Comments: Raymond Mcclure is a 47 y.o. male who presents to the Emergency Department complaining of a burn to his left wrist that occurred yesterday. Pt states he was working on his car when the water hose burst on his left wrist.  Pt states he had 3 blisters, the largest of which popped today-- states he just wanted it to be checked out.  States there is some intermittent numbness and paresthesias surrounding largest burn.  Denies any drainage.  Has been keeping area clean and applying neosporin.  Denies any fevers, sweats, or chills.  History reviewed. No pertinent past medical history. Past Surgical History  Procedure Laterality Date  . Wrist surgery      nerve repair   No family history on file. History  Substance Use Topics  . Smoking status: Current Every Day Smoker -- 0.15 packs/day    Types: Cigarettes  . Smokeless tobacco: Not on file  . Alcohol Use: No    Review of Systems  Skin: Positive for wound (burn).  Neurological: Positive for numbness.  All other systems reviewed and are negative.    Allergies  Tramadol  Home Medications  No current outpatient prescriptions on file.  BP 126/81  Pulse 92  Temp(Src) 98.4 F (36.9 C) (Oral)  Resp 18  Ht 5\' 10"  (1.778 m)  Wt 249 lb (112.946 kg)  BMI 35.73 kg/m2  SpO2 97%  Physical Exam  Nursing note and vitals reviewed. Constitutional: He is oriented to person, place, and time. He appears well-developed and well-nourished. No distress.  HENT:  Head: Normocephalic and atraumatic.  Mouth/Throat:  Oropharynx is clear and moist.  Eyes: Conjunctivae and EOM are normal. Pupils are equal, round, and reactive to light.  Neck: Normal range of motion. Neck supple.  Cardiovascular: Normal rate, regular rhythm and normal heart sounds.   Capillary refill less than 3 seconds.  Pulmonary/Chest: Effort normal and breath sounds normal. No respiratory distress. He has no wheezes.  Musculoskeletal: Normal range of motion.  Neurological: He is alert and oriented to person, place, and time.  Skin: Skin is warm and dry. Burn noted. He is not diaphoretic.  4 cm x 1 cm second degree burn to left thenar eminence. Margins clean, underlying tissue pink, no drainage or signs of infection; 2 large blisters along radial aspect of left wrist  Psychiatric: He has a normal mood and affect.    ED Course  Procedures (including critical care time)  DIAGNOSTIC STUDIES: Oxygen Saturation is 97% on RA, normal by my interpretation.    COORDINATION OF CARE: 6:37 PM-Discussed treatment plan which includes silvadene with pt at bedside and pt agreed to plan.   Labs Review Labs Reviewed - No data to display Imaging Review No results found.  EKG Interpretation   None       MDM   1. Second degree burn of hand, left, initial encounter    Second degree burn without signs of infection. Silvadene cream applied and wound bandaged in the ED-- instructed to continue daily wound care  at home.  Advised not to pop other blisters.  Discussed plan with pt, he agreed.  Strict return precautions advised for signs of infection including increased redness, warmth, drainage, etc.    I personally performed the services described in this documentation, which was scribed in my presence. The recorded information has been reviewed and is accurate.  Garlon Hatchet, PA-C 12/04/12 2311

## 2012-12-04 NOTE — ED Provider Notes (Signed)
Medical screening examination/treatment/procedure(s) were performed by non-physician practitioner and as supervising physician I was immediately available for consultation/collaboration.  EKG Interpretation   None         Richardean Canal, MD 12/04/12 2316

## 2012-12-04 NOTE — ED Notes (Signed)
Patient suffered a hot water burn to his left hand.  There is a large open area at the base of his left thumb 4 X 2 cm.  The wound bed is pink. There are two blister on his left wrist that are still intact.

## 2013-02-21 ENCOUNTER — Encounter (HOSPITAL_COMMUNITY): Payer: Self-pay | Admitting: Emergency Medicine

## 2013-02-21 ENCOUNTER — Emergency Department (INDEPENDENT_AMBULATORY_CARE_PROVIDER_SITE_OTHER)
Admission: EM | Admit: 2013-02-21 | Discharge: 2013-02-21 | Disposition: A | Discharge status: Home / Self Care | Payer: 59 | Source: Home / Self Care | Attending: Family Medicine | Admitting: Family Medicine

## 2013-02-21 DIAGNOSIS — H9202 Otalgia, left ear: Secondary | ICD-10-CM

## 2013-02-21 DIAGNOSIS — R590 Localized enlarged lymph nodes: Secondary | ICD-10-CM

## 2013-02-21 DIAGNOSIS — H9209 Otalgia, unspecified ear: Secondary | ICD-10-CM

## 2013-02-21 DIAGNOSIS — R599 Enlarged lymph nodes, unspecified: Secondary | ICD-10-CM

## 2013-02-21 MED ORDER — ANTIPYRINE-BENZOCAINE 5.4-1.4 % OT SOLN
3.0000 [drp] | OTIC | Status: DC | PRN
  Administered 2013-02-21: 3 [drp] via OTIC

## 2013-02-21 MED ORDER — DICLOFENAC SODIUM 75 MG PO TBEC: 75.0000 mg | DELAYED_RELEASE_TABLET | Freq: Two times a day (BID) | ORAL | Status: DC | PRN

## 2013-02-21 NOTE — ED Notes (Signed)
Dr. Denyse Amassorey is in the room w/the pt.  C/o knot on left side of head/ear w/sxs that include: HA He is alert w/no signs of acute distress.

## 2013-02-21 NOTE — Discharge Instructions (Signed)
Thank you for coming in today. Take diclofenac twice daily as needed for pain. Use the ear drops every 2 hours as needed for pain. If you're not getting better please return to the urgent care or followup with your doctor. Call or go to the emergency room if you get worse, have trouble breathing, have chest pains, or palpitations.

## 2013-02-21 NOTE — ED Provider Notes (Signed)
Raymond Mcclure is a 48 y.o. male who presents to Urgent Care today for left ear pain and swollen lymph nodes. This is been present over the past 3 days. Patient denies any significant fevers chills nausea vomiting or diarrhea. He notes mild left ear pain. He notes a small swollen as noted behind his left ear. He feels well otherwise. He has not tried any medications.   History reviewed. No pertinent past medical history. History  Substance Use Topics  . Smoking status: Current Every Day Smoker -- 0.15 packs/day    Types: Cigarettes  . Smokeless tobacco: Not on file  . Alcohol Use: No   ROS as above Medications: Current Facility-Administered Medications  Medication Dose Route Frequency Provider Last Rate Last Dose  . antipyrine-benzocaine (AURALGAN) otic solution 3-4 drop  3-4 drop Left Ear Q2H PRN Rodolph BongEvan S Hobie Kohles, MD   3 drop at 02/21/13 1422   Current Outpatient Prescriptions  Medication Sig Dispense Refill  . diclofenac (VOLTAREN) 75 MG EC tablet Take 1 tablet (75 mg total) by mouth 2 (two) times daily as needed.  60 tablet  0    Exam:  BP 126/84  Pulse 78  Temp(Src) 98.3 F (36.8 C) (Oral)  Resp 18  SpO2 98% Gen: Well NAD HEENT: EOMI,  MMM) membranes normal appearing. Left is mildly retracted. Tiny mildly tender posterior are regular lymph node present. Mastoids nontender.  Lungs: Normal work of breathing. CTABL Heart: RRR no MRG Abd: NABS, Soft. NT, ND Exts: Brisk capillary refill, warm and well perfused.   Patient was given Auralgan eardrops and had significant improvement in symptoms.  Assessment and Plan: 48 y.o. male with left ear pain with left posterior regular lymphadenopathy.  Plan to treat with diclofenac Auralgan ear drops and watchful waiting. If worsening followup at the urgent care or primary care provider.   Discussed warning signs or symptoms. Please see discharge instructions. Patient expresses understanding.    Rodolph BongEvan S Rasul Decola, MD 02/21/13 1440

## 2013-04-17 ENCOUNTER — Encounter (HOSPITAL_COMMUNITY): Payer: Self-pay | Admitting: Emergency Medicine

## 2013-04-17 ENCOUNTER — Emergency Department (HOSPITAL_COMMUNITY)
Admission: EM | Admit: 2013-04-17 | Discharge: 2013-04-17 | Discharge status: Against Medical Advice | Payer: 59 | Attending: Emergency Medicine | Admitting: Emergency Medicine

## 2013-04-17 DIAGNOSIS — F17200 Nicotine dependence, unspecified, uncomplicated: Secondary | ICD-10-CM | POA: Insufficient documentation

## 2013-04-17 DIAGNOSIS — G43909 Migraine, unspecified, not intractable, without status migrainosus: Secondary | ICD-10-CM | POA: Insufficient documentation

## 2013-04-17 NOTE — ED Notes (Signed)
No answer x1

## 2013-04-17 NOTE — ED Notes (Signed)
Pt reports having migraine x 3 days. Hx of migraines. Pain only temp relieved with otc pain meds. Having nausea and sensitivity to light.

## 2013-04-18 ENCOUNTER — Encounter (HOSPITAL_COMMUNITY): Payer: Self-pay | Admitting: Emergency Medicine

## 2013-04-18 ENCOUNTER — Emergency Department (INDEPENDENT_AMBULATORY_CARE_PROVIDER_SITE_OTHER)
Admission: EM | Admit: 2013-04-18 | Discharge: 2013-04-18 | Disposition: A | Discharge status: Home / Self Care | Payer: 59 | Source: Home / Self Care | Attending: Emergency Medicine | Admitting: Emergency Medicine

## 2013-04-18 DIAGNOSIS — G44009 Cluster headache syndrome, unspecified, not intractable: Secondary | ICD-10-CM

## 2013-04-18 HISTORY — DX: Cluster headache syndrome, unspecified, not intractable: G44.009

## 2013-04-18 MED ORDER — DEXAMETHASONE SODIUM PHOSPHATE 10 MG/ML IJ SOLN
INTRAMUSCULAR | Status: AC
  Filled 2013-04-18: qty 1

## 2013-04-18 MED ORDER — METOCLOPRAMIDE HCL 5 MG/ML IJ SOLN
10.0000 mg | Freq: Once | INTRAMUSCULAR | Status: AC
  Administered 2013-04-18: 10 mg via INTRAMUSCULAR

## 2013-04-18 MED ORDER — PREDNISONE 20 MG PO TABS: ORAL_TABLET | ORAL | Status: DC

## 2013-04-18 MED ORDER — DEXAMETHASONE SODIUM PHOSPHATE 10 MG/ML IJ SOLN
10.0000 mg | Freq: Once | INTRAMUSCULAR | Status: AC
  Administered 2013-04-18: 10 mg via INTRAMUSCULAR

## 2013-04-18 MED ORDER — KETOROLAC TROMETHAMINE 60 MG/2ML IM SOLN
INTRAMUSCULAR | Status: AC
  Filled 2013-04-18: qty 2

## 2013-04-18 MED ORDER — KETOROLAC TROMETHAMINE 60 MG/2ML IM SOLN
60.0000 mg | Freq: Once | INTRAMUSCULAR | Status: AC
  Administered 2013-04-18: 60 mg via INTRAMUSCULAR

## 2013-04-18 MED ORDER — METOCLOPRAMIDE HCL 5 MG/ML IJ SOLN
INTRAMUSCULAR | Status: AC
  Filled 2013-04-18: qty 2

## 2013-04-18 NOTE — ED Provider Notes (Signed)
Chief Complaint   Chief Complaint  Patient presents with  . Headache    History of Present Illness   Raymond Mcclure is a 48 year old male who has had a five-day history of intermittent, severe, right-sided headaches. These are throbbing and associated with nausea, photophobia, and phonophobia. His vision is blurry. He's had nasal congestion, rhinorrhea, and watering of the right eye. He denies any fever, chills, stiff neck, or URI symptoms. He denies any diplopia, paresthesias, weakness, or difficulty with speech or ambulation. He has had a history of cluster headaches first diagnosed 2 years ago. He was hospitalized in [place], [place] and had an MRI scan and further workup. He was treated with medications including inhaled 100% oxygen with good results. The headaches have been gone for a while, just having come back for last 5 days. He cannot think of any precipitating factors.  Review of Systems   Other than as noted above, the patient denies any of the following symptoms: Systemic:  No fever, chills, photophobia, or stiff neck. Eye:  No blurred vision, or diplopia. ENT:  No nasal congestion, rhinorrhea, sinus pressure or pain, or sore throat.  No jaw claudication. Neuro:  No paresthesias, loss of consciousness, seizure activity, muscle weakness, trouble with coordination or gait, trouble speaking or swallowing. Psych:  No depression, anxiety or trouble sleeping.  PMFSH   Past medical history, family history, social history, meds, and allergies were reviewed.    Physical Examination    Vital signs:  BP 153/96  Pulse 67  Temp(Src) 98.7 F (37.1 C) (Oral)  Resp 20  SpO2 97% General:  Alert and oriented.  He is lying on the table with his face covered up in a dark room. Eye:  Lids and conjunctivas normal.  PERRL,  Full EOMs.  Fundi benign with normal discs and vessels. ENT:  No cranial or facial tenderness to palpation.  TMs and canals clear.  Nasal mucosa was normal and  uncongested without any drainage. No intra oral lesions, pharynx clear, mucous membranes moist, dentition normal. Neck:  Supple, full ROM, no tenderness to palpation.  No adenopathy or mass. Neuro:  Alert and orented times 3.  Speech was clear, fluent, and appropriate.  Cranial nerves intact. No pronator drift, muscle strength normal. Finger to nose normal.  DTRs were 2+ and symmetrical.Station and gait were normal.  Romberg's sign was normal.  Able to perform tandem gait well. Psych:  Normal affect.  Course in Urgent Care Center     He was given Toradol 60 mg IM, Decadron 10 mg IM, and Reglan 10 mg IM.  Assessment   The encounter diagnosis was Cluster headache.  Plan   1.  Meds:  The following meds were prescribed:   Discharge Medication List as of 04/18/2013  9:19 PM    START taking these medications   Details  predniSONE (DELTASONE) 20 MG tablet Take 3 daily for 7 days, 2 daily for 7 days, 1 daily for 7 days., Normal        2.  Patient Education/Counseling:  The patient was given appropriate handouts, self care instructions, and instructed in symptomatic relief.    3.  Follow up:  The patient was told to follow up here if no better in 3 to 4 days, or sooner if becoming worse in any way, and given some red flag symptoms such as fever, worsening pain, persistent vomiting, or new neurological symptoms which would prompt immediate return.  Followup with Dr. Karenann Cai as soon  as possible.     Reuben Likesavid C Natia Fahmy, MD 04/18/13 [phone]

## 2013-04-18 NOTE — Discharge Instructions (Signed)
Cluster Headache  Cluster headaches are recognized by their pattern of deep, intense head pain. They normally occur on one side of your head, but they may "switch sides" in subsequent episodes. Typically, cluster headaches:   · Are severe in nature.    · Occur repeatedly over weeks to months and are followed by periods of no headaches.    · Can last from 15 minutes to 3 hours.    · Occur at the same time each day, often at night.    · Occur several times a day.  CAUSES  The exact cause of cluster headaches is not known. Alcohol use may be associated with cluster headaches.  SIGNS AND SYMPTOMS   · Severe pain that begins in or around your eye or temple.    · One-sided head pain.    · Feeling sick to your stomach (nauseous).    · Sensitivity to light.    · Runny nose.    · Eye redness, tearing, and nasal stuffiness on the side of your head where you are experiencing pain.    · Sweaty, pale skin of the face.    · Droopy or swollen eyelid.    · Restlessness.  DIAGNOSIS   Cluster headaches are diagnosed based on symptoms and a physical exam. Your health care provider may order a CT scan or an MRI of your head or lab tests to see if your headaches are caused by other medical conditions.   TREATMENT   · Medicines for pain relief and to prevent recurrent attacks. Some people may need a combination of medicines.  · Oxygen for pain relief.    · Biofeedback programs to help reduce headache pain.    It may be helpful to keep a headache diary. This may help you find a trend for what is triggering your headaches. Your health care provider can develop a treatment plan.   HOME CARE INSTRUCTIONS   During cluster periods:   · Follow a regular sleep schedule. Do not vary the amount and time that you sleep from day to day. It is important to stay on the same schedule during a cluster period to help prevent headaches.    · Avoid alcohol.    · Stop smoking if you smoke.    SEEK MEDICAL CARE IF:  · You have any changes from your previous  cluster headaches either in intensity or frequency.    · You are not getting relief from medicines you are taking.    SEEK IMMEDIATE MEDICAL CARE IF:   · You faint.    · You have weakness or numbness, especially on one side of your body or face.    · You have double vision.    · You have nausea or vomiting that is not relieved within several hours.    · You cannot keep your balance or have difficulty talking or walking.    · You have neck pain or stiffness.    · You have a fever.  MAKE SURE YOU:  · Understand these instructions.    · Will watch your condition.    · Will get help right away if you are not doing well or get worse.  Document Released: 01/18/2005 Document Revised: 11/08/2012 Document Reviewed: 08/10/2012  [employer]® Patient Information ©2014 [employer].

## 2013-04-18 NOTE — ED Notes (Signed)
C[place] that [place] cluster HA. Takes no medication except for excedrin . Has breaks in his 3-4 day duration that he has no pain

## 2013-04-22 ENCOUNTER — Encounter (HOSPITAL_COMMUNITY): Payer: Self-pay | Admitting: Emergency Medicine

## 2013-04-22 ENCOUNTER — Emergency Department (HOSPITAL_COMMUNITY)
Admission: EM | Admit: 2013-04-22 | Discharge: 2013-04-23 | Disposition: A | Discharge status: Home / Self Care | Payer: 59 | Attending: Emergency Medicine | Admitting: Emergency Medicine

## 2013-04-22 DIAGNOSIS — H9209 Otalgia, unspecified ear: Secondary | ICD-10-CM | POA: Insufficient documentation

## 2013-04-22 DIAGNOSIS — S46819A Strain of other muscles, fascia and tendons at shoulder and upper arm level, unspecified arm, initial encounter: Secondary | ICD-10-CM

## 2013-04-22 DIAGNOSIS — R51 Headache: Secondary | ICD-10-CM

## 2013-04-22 DIAGNOSIS — X58XXXA Exposure to other specified factors, initial encounter: Secondary | ICD-10-CM | POA: Insufficient documentation

## 2013-04-22 DIAGNOSIS — Y939 Activity, unspecified: Secondary | ICD-10-CM | POA: Insufficient documentation

## 2013-04-22 DIAGNOSIS — S43499A Other sprain of unspecified shoulder joint, initial encounter: Secondary | ICD-10-CM | POA: Insufficient documentation

## 2013-04-22 DIAGNOSIS — Y929 Unspecified place or not applicable: Secondary | ICD-10-CM | POA: Insufficient documentation

## 2013-04-22 DIAGNOSIS — G44009 Cluster headache syndrome, unspecified, not intractable: Secondary | ICD-10-CM | POA: Insufficient documentation

## 2013-04-22 DIAGNOSIS — IMO0002 Reserved for concepts with insufficient information to code with codable children: Secondary | ICD-10-CM | POA: Insufficient documentation

## 2013-04-22 DIAGNOSIS — R519 Headache, unspecified: Secondary | ICD-10-CM

## 2013-04-22 DIAGNOSIS — F17200 Nicotine dependence, unspecified, uncomplicated: Secondary | ICD-10-CM | POA: Insufficient documentation

## 2013-04-22 MED ORDER — LORATADINE 10 MG PO TABS: 10.0000 mg | ORAL_TABLET | Freq: Every day | ORAL | Status: DC

## 2013-04-22 MED ORDER — IBUPROFEN 600 MG PO TABS: 600.0000 mg | ORAL_TABLET | Freq: Four times a day (QID) | ORAL | Status: DC | PRN

## 2013-04-22 MED ORDER — OXYMETAZOLINE HCL 0.05 % NA SOLN: 1.0000 | Freq: Two times a day (BID) | NASAL | Status: DC

## 2013-04-22 MED ORDER — METHOCARBAMOL 500 MG PO TABS: 500.0000 mg | ORAL_TABLET | Freq: Two times a day (BID) | ORAL | Status: DC

## 2013-04-22 MED ORDER — MOMETASONE FUROATE 50 MCG/ACT NA SUSP: 2.0000 | Freq: Every day | NASAL | Status: DC

## 2013-04-22 MED ORDER — KETOROLAC TROMETHAMINE 60 MG/2ML IM SOLN
60.0000 mg | Freq: Once | INTRAMUSCULAR | Status: AC
  Administered 2013-04-22: 60 mg via INTRAMUSCULAR
  Filled 2013-04-22: qty 2

## 2013-04-22 NOTE — ED Notes (Signed)
Pt states he has had a headache for two weeks and he has taken his migraine medication as prescribed,  Last dose at 6p

## 2013-04-22 NOTE — ED Provider Notes (Signed)
CSN: [id number]     Arrival date & time 04/22/13  2015 History   First MD Initiated Contact with Patient 04/22/13 2307     Chief Complaint  Patient presents with  . Headache     (Consider location/radiation/quality/duration/timing/severity/associated sxs/prior Treatment) HPI Patient presents with right facial pain for the past 9-10 days. He's had eye watering and nasal congestion. The pain is worse when bending forward. He is referred to the top teeth on the right side. He denies any specific tooth pain. He also states she's had right-sided ear fullness and pain without any hearing changes. He's had no fever or chills. He has mild neck pain without any stiffness. He denies any focal weakness or numbness. He has no vision changes. She has a history of cluster headaches but states this is a different type of pain to his normal cluster headache. He was seen 4 days ago and started on a prednisone taper. He's also been taking Excedrin at home with minimal relief. Past Medical History  Diagnosis Date  . Cluster headaches    Past Surgical History  Procedure Laterality Date  . Wrist surgery      nerve repair   No family history on file. History  Substance Use Topics  . Smoking status: Current Every Day Smoker -- 0.15 packs/day    Types: Cigarettes  . Smokeless tobacco: Not on file  . Alcohol Use: No    Review of Systems  Constitutional: Negative for fever and chills.  HENT: Positive for congestion, ear pain, sinus pressure and sneezing. Negative for dental problem, ear discharge, hearing loss, rhinorrhea and sore throat.   Eyes: Negative for photophobia, redness and visual disturbance.  Respiratory: Negative for cough and shortness of breath.   Cardiovascular: Negative for chest pain, palpitations and leg swelling.  Gastrointestinal: Negative for nausea, vomiting and abdominal pain.  Musculoskeletal: Positive for myalgias and neck pain. Negative for arthralgias and neck stiffness.   Skin: Negative for rash and wound.  Neurological: Positive for headaches. Negative for dizziness, weakness, light-headedness and numbness.  All other systems reviewed and are negative.      Allergies  Tramadol  Home Medications   Current Outpatient Rx  Name  Route  Sig  Dispense  Refill  . aspirin-acetaminophen-caffeine (EXCEDRIN MIGRAINE) 250-250-65 MG per tablet   Oral   Take 1 tablet by mouth every 6 (six) hours as needed for headache.         . predniSONE (DELTASONE) 20 MG tablet   Oral   Take 20-60 mg by mouth daily with breakfast. Take 3 tablets daily for 7 days, 2 tablets daily for 7 days, then 1 tablet daily for 7 days          BP 130/88  Pulse 98  Temp(Src) 98.2 F (36.8 C) (Oral)  Resp 18  SpO2 97% Physical Exam  Nursing note and vitals reviewed. Constitutional: He is oriented to person, place, and time. He appears well-developed and well-nourished. No distress.  HENT:  Head: Normocephalic and atraumatic.  Mouth/Throat: Oropharynx is clear and moist. No oropharyngeal exudate.  Bulging right TM. Bilateral nasal mucosal edema right greater than left. Tenderness to percussion over the right maxillary sinus. No dental pain with palpation. No intraoral masses.  Eyes: EOM are normal. Pupils are equal, round, and reactive to light.  Neck: Normal range of motion. Neck supple.  No meningismus. Patient does have tenderness to palpation of the right trapezius muscle  Cardiovascular: Normal rate and regular rhythm.   Pulmonary/Chest:  Effort normal and breath sounds normal. No respiratory distress. He has no wheezes. He has no rales. He exhibits no tenderness.  Abdominal: Soft. Bowel sounds are normal. He exhibits no distension and no mass. There is no tenderness. There is no rebound and no guarding.  Musculoskeletal: Normal range of motion. He exhibits no edema and no tenderness.  Neurological: He is alert and oriented to person, place, and time.  Patient is alert and  oriented x3 with clear, goal oriented speech. Patient has 5/5 motor in all extremities. Sensation is intact to light touch. Patient has a normal gait and walks without assistance.   Skin: Skin is warm and dry. No rash noted. No erythema.  Psychiatric: He has a normal mood and affect. His behavior is normal.    ED Course  Procedures (including critical care time) Labs Review Labs Reviewed - No data to display Imaging Review No results found.   EKG Interpretation None      MDM   Final diagnoses:  None    Patient's symptoms consistent with right maxillary sinusitis. Start on nasal decongestants and Claritin. I don't believe antibiotics are needed at this time. Patient advised to stop taking his prednisone. Return precautions have been given the patient is voiced understanding.    Raymond Raceravid Islay Polanco, MD 04/22/13 [id number]

## 2013-04-22 NOTE — Discharge Instructions (Signed)
Muscle Strain A muscle strain is an injury that occurs when a muscle is stretched beyond its normal length. Usually a small number of muscle fibers are torn when this happens. Muscle strain is rated in degrees. First-degree strains have the least amount of muscle fiber tearing and pain. Second-degree and third-degree strains have increasingly more tearing and pain.  Usually, recovery from muscle strain takes 1 2 weeks. Complete healing takes 5 6 weeks.  CAUSES  Muscle strain happens when a sudden, violent force placed on a muscle stretches it too far. This may occur with lifting, sports, or a fall.  RISK FACTORS Muscle strain is especially common in athletes.  SIGNS AND SYMPTOMS At the site of the muscle strain, there may be:  Pain.  Bruising.  Swelling.  Difficulty using the muscle due to pain or lack of normal function. DIAGNOSIS  Your health care provider will perform a physical exam and ask about your medical history. TREATMENT  Often, the best treatment for a muscle strain is resting, icing, and applying cold compresses to the injured area.  HOME CARE INSTRUCTIONS   Use the PRICE method of treatment to promote muscle healing during the first 2 3 days after your injury. The PRICE method involves:  Protecting the muscle from being injured again.  Restricting your activity and resting the injured body part.  Icing your injury. To do this, put ice in a plastic bag. Place a towel between your skin and the bag. Then, apply the ice and leave it on from 15 20 minutes each hour. After the third day, switch to moist heat packs.  Apply compression to the injured area with a splint or elastic bandage. Be careful not to wrap it too tightly. This may interfere with blood circulation or increase swelling.  Elevate the injured body part above the level of your heart as often as you can.  Only take over-the-counter or prescription medicines for pain, discomfort, or fever as directed by your  health care provider.  Warming up prior to exercise helps to prevent future muscle strains. SEEK MEDICAL CARE IF:   You have increasing pain or swelling in the injured area.  You have numbness, tingling, or a significant loss of strength in the injured area. MAKE SURE YOU:   Understand these instructions.  Will watch your condition.  Will get help right away if you are not doing well or get worse. Document Released: 01/18/2005 Document Revised: 11/08/2012 Document Reviewed: 08/17/2012 [facility] Patient Information 2014 [place], [place].  Sinus Headache A sinus headache is when your sinuses become clogged or swollen. Sinus headaches can range from mild to severe.  CAUSES A sinus headache can have different causes, such as:  Colds.  Sinus infections.  Allergies. SYMPTOMS  Symptoms of a sinus headache may vary and can include:  Headache.  Pain or pressure in the face.  Congested or runny nose.  Fever.  Inability to smell.  Pain in upper teeth. Weather changes can make symptoms worse. TREATMENT  The treatment of a sinus headache depends on the cause.  Sinus pain caused by a sinus infection may be treated with antibiotic medicine.  Sinus pain caused by allergies may be helped by allergy medicines (antihistamines) and medicated nasal sprays.  Sinus pain caused by congestion may be helped by flushing the nose and sinuses with saline solution. HOME CARE INSTRUCTIONS   If antibiotics are prescribed, take them as directed. Finish them even if you start to feel better.  Only take over-the-counter or  prescription medicines for pain, discomfort, or fever as directed by your caregiver.  If you have congestion, use a nasal spray to help reduce pressure. SEEK IMMEDIATE MEDICAL CARE IF:  You have a fever.  You have headaches more than once a week.  You have sensitivity to light or sound.  You have repeated nausea and vomiting.  You have vision problems.  You have  sudden, severe pain in your face or head.  You have a seizure.  You are confused.  Your sinus headaches do not get better after treatment. Many people think they have a sinus headache when they actually have migraines or tension headaches. MAKE SURE YOU:   Understand these instructions.  Will watch your condition.  Will get help right away if you are not doing well or get worse. Document Released: 02/26/2004 Document Revised: 04/12/2011 Document Reviewed: 04/18/2010 [facility] Patient Information 2014 [place].

## 2013-04-22 NOTE — ED Notes (Addendum)
Pt reports right sided headache in which he also reports blurred vision as well as right sided neck, jaw, and earache. Pt reports history of migraines and cluster headaches.Pt reports having intermittent light sensitively. Pt reports taking prednisone and Excedrin as prescribed by Urgent Care, which he states his symptoms have decreased, however "it is just like a Band-Aid, my pain is still there." Pt reports having a neurologist follow up scheduled, however it isn't until next month. Pt is A/O x4, in NAD, and vitals are WDL.

## 2013-05-01 ENCOUNTER — Emergency Department (HOSPITAL_COMMUNITY)
Admission: EM | Admit: 2013-05-01 | Discharge: 2013-05-02 | Disposition: A | Discharge status: Home / Self Care | Payer: 59 | Attending: Emergency Medicine | Admitting: Emergency Medicine

## 2013-05-01 ENCOUNTER — Emergency Department (HOSPITAL_COMMUNITY): Payer: 59

## 2013-05-01 ENCOUNTER — Encounter (HOSPITAL_COMMUNITY): Payer: Self-pay | Admitting: Emergency Medicine

## 2013-05-01 DIAGNOSIS — IMO0002 Reserved for concepts with insufficient information to code with codable children: Secondary | ICD-10-CM | POA: Insufficient documentation

## 2013-05-01 DIAGNOSIS — R61 Generalized hyperhidrosis: Secondary | ICD-10-CM | POA: Insufficient documentation

## 2013-05-01 DIAGNOSIS — Z79899 Other long term (current) drug therapy: Secondary | ICD-10-CM | POA: Insufficient documentation

## 2013-05-01 DIAGNOSIS — G43909 Migraine, unspecified, not intractable, without status migrainosus: Secondary | ICD-10-CM | POA: Insufficient documentation

## 2013-05-01 DIAGNOSIS — R42 Dizziness and giddiness: Secondary | ICD-10-CM | POA: Insufficient documentation

## 2013-05-01 DIAGNOSIS — F17200 Nicotine dependence, unspecified, uncomplicated: Secondary | ICD-10-CM | POA: Insufficient documentation

## 2013-05-01 MED ORDER — DEXAMETHASONE SODIUM PHOSPHATE 10 MG/ML IJ SOLN
10.0000 mg | Freq: Once | INTRAMUSCULAR | Status: AC
  Administered 2013-05-01: 10 mg via INTRAVENOUS
  Filled 2013-05-01: qty 1

## 2013-05-01 MED ORDER — TETRACAINE HCL 0.5 % OP SOLN
1.0000 [drp] | Freq: Once | OPHTHALMIC | Status: AC
  Administered 2013-05-01: 1 [drp] via OPHTHALMIC
  Filled 2013-05-01: qty 2

## 2013-05-01 MED ORDER — METOCLOPRAMIDE HCL 5 MG/ML IJ SOLN
10.0000 mg | Freq: Once | INTRAMUSCULAR | Status: AC
  Administered 2013-05-01: 10 mg via INTRAVENOUS
  Filled 2013-05-01: qty 2

## 2013-05-01 MED ORDER — SODIUM CHLORIDE 0.9 % IV BOLUS (SEPSIS)
1000.0000 mL | Freq: Once | INTRAVENOUS | Status: AC
  Administered 2013-05-01: 1000 mL via INTRAVENOUS

## 2013-05-01 MED ORDER — KETOROLAC TROMETHAMINE 30 MG/ML IJ SOLN
30.0000 mg | Freq: Once | INTRAMUSCULAR | Status: AC
  Administered 2013-05-01: 30 mg via INTRAVENOUS
  Filled 2013-05-01: qty 1

## 2013-05-01 MED ORDER — DIPHENHYDRAMINE HCL 50 MG/ML IJ SOLN
25.0000 mg | Freq: Once | INTRAMUSCULAR | Status: AC
  Administered 2013-05-01: 25 mg via INTRAVENOUS
  Filled 2013-05-01: qty 1

## 2013-05-01 NOTE — ED Notes (Signed)
Pt reports has had headache to R anterior head x 1 month that often wakes him from sleeping. Pt reports sensitivity to noise and light and pain to back of R eye. Pt reports sister had similar sx and was diagnosed with a brain tumor.

## 2013-05-01 NOTE — ED Provider Notes (Signed)
CSN: [id number]     Arrival date & time 05/01/13  1945 History  This chart was scribed for non-physician practitioner, Kyung BaccaKatie Kalden Wanke, PA-C,working with Flint MelterElliott L Wentz, MD, by Karle PlumberJennifer Tensley, ED Scribe.  This patient was seen in room WTR6/WTR6 and the patient's care was started at 9:01 PM.  Chief Complaint  Patient presents with  . Migraine   The history is provided by the patient. No language interpreter was used.   HPI Comments:  Raymond BattyJoseph D Mcclure is a 48 y.o. male with h/o migraine, who presents to the Emergency Department complaining of a waxing and waning throbbing HA located mainly on the right side for the past month +. Pt states the pain improves but intensifies three to four times daily. He reports associated dizziness, nausea, vomiting, constant visual blurriness of the right eye, and diaphoresis. He reports new onset right arm numbness and right shoulder pain. He states he has had these symptoms with his migraines in the past. He reports using Nasonex nasal spray, Motrin, and Robaxin with no relief. He denies abdominal pain or trouble swallowing. He reports that his sister was recently diagnosed with a brain tumor earlier this year. He reports family h/o glaucoma. He states he has an appt with a neurologist in June.   Past Medical History  Diagnosis Date  . Cluster headaches    Past Surgical History  Procedure Laterality Date  . Wrist surgery      nerve repair   No family history on file. History  Substance Use Topics  . Smoking status: Current Some Day Smoker -- 0.15 packs/day    Types: Cigarettes  . Smokeless tobacco: Not on file  . Alcohol Use: No    Review of Systems  Eyes: Positive for photophobia.  Neurological: Positive for headaches.  All other systems reviewed and are negative.    Allergies  Tramadol  Home Medications   Current Outpatient Rx  Name  Route  Sig  Dispense  Refill  . aspirin-acetaminophen-caffeine (EXCEDRIN MIGRAINE) 250-250-65 MG per  tablet   Oral   Take 1 tablet by mouth every 6 (six) hours as needed for headache.         . ibuprofen (ADVIL,MOTRIN) 600 MG tablet   Oral   Take 1 tablet (600 mg total) by mouth every 6 (six) hours as needed.   30 tablet   0   . loratadine (CLARITIN) 10 MG tablet   Oral   Take 1 tablet (10 mg total) by mouth daily. One po daily x 5 days   5 tablet   0   . methocarbamol (ROBAXIN) 500 MG tablet   Oral   Take 1 tablet (500 mg total) by mouth 2 (two) times daily.   20 tablet   0   . mometasone (NASONEX) 50 MCG/ACT nasal spray   Nasal   Place 2 sprays into the nose daily.   17 g   12   . oxymetazoline (AFRIN NASAL SPRAY) 0.05 % nasal spray   Each Nare   Place 1 spray into both nostrils 2 (two) times daily.   30 mL   0   . predniSONE (DELTASONE) 20 MG tablet   Oral   Take 20-60 mg by mouth daily with breakfast. Take 3 tablets daily for 7 days, 2 tablets daily for 7 days, then 1 tablet daily for 7 days          Triage Vitals: BP 140/89  Pulse 105  Temp(Src) 98.2 F (36.8 C) (Oral)  Resp 18  Ht 5\' 10"  (1.778 m)  Wt 245 lb (111.131 kg)  BMI 35.15 kg/m2  SpO2 95% Physical Exam  Nursing note and vitals reviewed. Constitutional: He is oriented to person, place, and time. He appears well-developed and well-nourished.  HENT:  Head: Normocephalic and atraumatic.  No tenderness of sinuses or temples.   Eyes: Conjunctivae and EOM are normal.  Normal appearance.  Photophobia.  IOP 1-67mmHg.  Visual acuity 20/25 bilaterally.  Neck: Normal range of motion. Neck supple. No rigidity. No Brudzinski's sign and no Kernig's sign noted.  Cardiovascular: Normal rate, regular rhythm and intact distal pulses.   Pulmonary/Chest: Effort normal and breath sounds normal.  Musculoskeletal: Normal range of motion.  Neurological: He is alert and oriented to person, place, and time. No sensory deficit. Coordination normal.  CN 3-12 intact.  No nystagmus.  5/5 and equal upper and lower  extremity strength.  No past pointing.    Skin: Skin is warm and dry. No rash noted.  Psychiatric: He has a normal mood and affect. His behavior is normal.    ED Course  Procedures (including critical care time) DIAGNOSTIC STUDIES: Oxygen Saturation is 95% on RA, adequate by my interpretation.   COORDINATION OF CARE: 9:10 PM- Will give pain medication and CT head. Will check IOP. Pt verbalizes understanding and agrees to plan.  Medications  oxyCODONE-acetaminophen (PERCOCET/ROXICET) 5-325 MG per tablet 1 tablet (not administered)  sodium chloride 0.9 % bolus 1,000 mL (0 mLs Intravenous Stopped 05/01/13 2313)  metoCLOPramide (REGLAN) injection 10 mg (10 mg Intravenous Given 05/01/13 2132)  diphenhydrAMINE (BENADRYL) injection 25 mg (25 mg Intravenous Given 05/01/13 2131)  dexamethasone (DECADRON) injection 10 mg (10 mg Intravenous Given 05/01/13 2132)  tetracaine (PONTOCAINE) 0.5 % ophthalmic solution 1 drop (1 drop Right Eye Given 05/01/13 2208)  ketorolac (TORADOL) 30 MG/ML injection 30 mg (30 mg Intravenous Given 05/01/13 2313)    Labs Review Labs Reviewed - No data to display Imaging Review Ct Head Wo Contrast  05/01/2013   CLINICAL DATA:  Right-sided headache, recurrent  EXAM: CT HEAD WITHOUT CONTRAST  TECHNIQUE: Contiguous axial images were obtained from the base of the skull through the vertex without contrast.  COMPARISON:  02/02/2007  FINDINGS: Normal appearance of the intracranial structures. No evidence for acute hemorrhage, mass lesion, midline shift, hydrocephalus or large infarct. No acute bony abnormality. The visualized sinuses are clear.  IMPRESSION: No acute intracranial abnormality.   Electronically Signed   By: Ruel Favors M.D.   On: 05/01/2013 21:34     EKG Interpretation None      MDM   Final diagnoses:  Migraine headache    47yo M presents w/ non-traumatic headache.  Onset 1.23months ago, intermittent, severe.  H/o migraines and this feels similar w/  exception of persistent R-sided blurred vision despite improvement in headache pain, as well as RUE numbness/weakenss.  He is concerned because sister diagnosed w/ brain tumor in the past year.  Pt seen for same twice in the past month, had temporarly relief w/ migraine cocktail at [facility] and was suspected of having a sinus headache at most recent visit 3/22, and prescribed claritin, nasonex, afrin, robaxin and ibuprofen.  No relief w/ these medications. Afebrile, NAD, non-toxic appearance, no focal neuro deficits or meningismus on exam.   He has a cousin w/ glaucoma.  IOP w/in nml range and symmetric visual acuity today. CT head negative.  Results discussed w/ pt.   IVF, reglan, decadron and benadryl ordered for pain.  Headache improved but still present following migraine cocktail.  IV toradol administered and pain resolved, but has already started to return.  Will give him single percocet and prescribe 12 more.  He understands that this medication will not treat underlying etiology of pain.  Prescribed phenergan to be taken w/ benadryl to trial for pain.  He has an appt scheduled w/ [facility] in June, but I have referred to [facility] to see if he can f/u sooner.  Return precautions discussed. 12:03 AM   I personally performed the services described in this documentation, which was scribed in my presence. The recorded information has been reviewed and is accurate.    Otilio Miu, PA-C 05/02/13 0003

## 2013-05-02 MED ORDER — OXYCODONE-ACETAMINOPHEN 5-325 MG PO TABS: 1.0000 | ORAL_TABLET | ORAL | Status: DC | PRN

## 2013-05-02 MED ORDER — OXYCODONE-ACETAMINOPHEN 5-325 MG PO TABS
1.0000 | ORAL_TABLET | Freq: Once | ORAL | Status: AC
  Administered 2013-05-02: 1 via ORAL
  Filled 2013-05-02: qty 1

## 2013-05-02 MED ORDER — PROMETHAZINE HCL 25 MG PO TABS: 25.0000 mg | ORAL_TABLET | Freq: Four times a day (QID) | ORAL | Status: DC | PRN

## 2013-05-02 NOTE — Discharge Instructions (Signed)
Take phenergan with over the counter benadryl for headache pain.  This medication will improve nausea as well.  Take percocet as needed for severe pain.  Do not drive within four hours of taking this medication (may cause drowsiness or confusion).   Follow up with neurology asap.  You should return to the ER if your headache worsens or you develop associated fever, difficulty with speech, swallowing or walking, or numbness/ weakness of your arms or legs.

## 2013-05-02 NOTE — ED Provider Notes (Signed)
Medical screening examination/treatment/procedure(s) were performed by non-physician practitioner and as supervising physician I was immediately available for consultation/collaboration.   EKG Interpretation None       Mazin Emma L Xander Jutras, MD 05/02/13 1840 

## 2013-06-05 ENCOUNTER — Encounter (HOSPITAL_COMMUNITY): Payer: Self-pay | Admitting: Emergency Medicine

## 2013-06-05 ENCOUNTER — Emergency Department (INDEPENDENT_AMBULATORY_CARE_PROVIDER_SITE_OTHER)
Admission: EM | Admit: 2013-06-05 | Discharge: 2013-06-05 | Disposition: A | Discharge status: Home / Self Care | Payer: 59 | Source: Home / Self Care | Attending: Family Medicine | Admitting: Family Medicine

## 2013-06-05 ENCOUNTER — Other Ambulatory Visit (HOSPITAL_COMMUNITY)
Admission: RE | Admit: 2013-06-05 | Discharge: 2013-06-05 | Disposition: A | Discharge status: Home / Self Care | Payer: 59 | Source: Ambulatory Visit | Attending: Family Medicine | Admitting: Family Medicine

## 2013-06-05 DIAGNOSIS — N342 Other urethritis: Secondary | ICD-10-CM

## 2013-06-05 DIAGNOSIS — Z113 Encounter for screening for infections with a predominantly sexual mode of transmission: Secondary | ICD-10-CM | POA: Insufficient documentation

## 2013-06-05 LAB — POCT URINALYSIS DIP (DEVICE)
BILIRUBIN URINE: NEGATIVE
GLUCOSE, UA: NEGATIVE mg/dL
Hgb urine dipstick: NEGATIVE
KETONES UR: NEGATIVE mg/dL
Leukocytes, UA: NEGATIVE
NITRITE: NEGATIVE
PH: 5.5 (ref 5.0–8.0)
Protein, ur: NEGATIVE mg/dL
Specific Gravity, Urine: >=1.03 (ref 1.005–1.030)
Urobilinogen, UA: 0.2 mg/dL (ref 0.0–1.0)

## 2013-06-05 LAB — HIV ANTIBODY (ROUTINE TESTING W REFLEX): HIV 1&2 Ab, 4th Generation: NONREACTIVE

## 2013-06-05 LAB — RPR

## 2013-06-05 MED ORDER — CEFTRIAXONE SODIUM 250 MG IJ SOLR
250.0000 mg | Freq: Once | INTRAMUSCULAR | Status: AC
  Administered 2013-06-05: 250 mg via INTRAMUSCULAR

## 2013-06-05 MED ORDER — AZITHROMYCIN 250 MG PO TABS
1000.0000 mg | ORAL_TABLET | Freq: Once | ORAL | Status: AC
  Administered 2013-06-05: 1000 mg via ORAL

## 2013-06-05 MED ORDER — AZITHROMYCIN 250 MG PO TABS
ORAL_TABLET | ORAL | Status: AC
  Filled 2013-06-05: qty 4

## 2013-06-05 MED ORDER — CEFTRIAXONE SODIUM 250 MG IJ SOLR
INTRAMUSCULAR | Status: AC
  Filled 2013-06-05: qty 250

## 2013-06-05 MED ORDER — LIDOCAINE HCL (PF) 1 % IJ SOLN
INTRAMUSCULAR | Status: AC
  Filled 2013-06-05: qty 5

## 2013-06-05 NOTE — ED Provider Notes (Signed)
CSN: [id number]     Arrival date & time 06/05/13  0831 History   First MD Initiated Contact with Patient 06/05/13 [phone]     Chief Complaint  Patient presents with  . Penile Discharge  . Dysuria   (Consider location/radiation/quality/duration/timing/severity/associated sxs/prior Treatment) HPI Comments: No fever/chills. No hematuria. No urinary frequency. Does not work No PCP  Patient is a 48 y.o. male presenting with penile discharge and dysuria. The history is provided by the patient.  Penile Discharge This is a new problem. Episode onset: 3 days. The problem has not changed since onset.Associated symptoms comments: +dysuria without reports of genital lesions.  Dysuria    Past Medical History  Diagnosis Date  . Cluster headaches    Past Surgical History  Procedure Laterality Date  . Wrist surgery      nerve repair   No family history on file. History  Substance Use Topics  . Smoking status: Current Some Day Smoker -- 0.15 packs/day    Types: Cigarettes  . Smokeless tobacco: Not on file  . Alcohol Use: No    Review of Systems  Gastrointestinal: Negative.   Genitourinary: Positive for dysuria and discharge. Negative for urgency, frequency, hematuria, flank pain, decreased urine volume, penile swelling, scrotal swelling, difficulty urinating, genital sores, penile pain and testicular pain.  Musculoskeletal: Negative for back pain.  All other systems reviewed and are negative.   Allergies  Tramadol  Home Medications   Prior to Admission medications   Medication Sig Start Date End Date Taking? Authorizing Provider  aspirin-acetaminophen-caffeine (EXCEDRIN MIGRAINE) 2515854213250-250-65 MG per tablet Take 1 tablet by mouth every 6 (six) hours as needed for headache.    Historical Provider, MD  ibuprofen (ADVIL,MOTRIN) 600 MG tablet Take 600 mg by mouth every 6 (six) hours as needed for mild pain. 04/22/13   Loren Raceravid Yelverton, MD  loratadine (CLARITIN) 10 MG tablet Take 10 mg by  mouth daily. 04/22/13   Loren Raceravid Yelverton, MD  methocarbamol (ROBAXIN) 500 MG tablet Take 500 mg by mouth 2 (two) times daily. 04/22/13   Loren Raceravid Yelverton, MD  mometasone (NASONEX) 50 MCG/ACT nasal spray Place 2 sprays into the nose daily. 04/22/13   Loren Raceravid Yelverton, MD  oxyCODONE-acetaminophen (PERCOCET/ROXICET) 5-325 MG per tablet Take 1 tablet by mouth every 4 (four) hours as needed for severe pain. 05/02/13   Arie Sabinaatherine E Schinlever, PA-C  oxymetazoline (AFRIN NASAL SPRAY) 0.05 % nasal spray Place 1 spray into both nostrils 2 (two) times daily. 04/22/13   Loren Raceravid Yelverton, MD  promethazine (PHENERGAN) 25 MG tablet Take 1 tablet (25 mg total) by mouth every 6 (six) hours as needed for nausea or vomiting. 05/02/13   Catherine E Schinlever, PA-C   BP 138/91  Pulse 87  Temp(Src) 98 F (36.7 C) (Oral)  Resp 16  SpO2 100% Physical Exam  Nursing note and vitals reviewed. Constitutional: He is oriented to person, place, and time. He appears well-developed and well-nourished. No distress.  HENT:  Head: Normocephalic and atraumatic.  Eyes: Conjunctivae are normal. No scleral icterus.  Cardiovascular: Normal rate.   Pulmonary/Chest: Effort normal.  Abdominal: Hernia confirmed negative in the right inguinal area and confirmed negative in the left inguinal area.  Genitourinary: Penis normal. Right testis shows no mass, no swelling and no tenderness. Left testis shows no mass, no swelling and no tenderness. Circumcised. No penile erythema or penile tenderness. No discharge found.  Musculoskeletal: Normal range of motion.  Lymphadenopathy:       Right: No inguinal adenopathy present.  Left: No inguinal adenopathy present.  Neurological: He is alert and oriented to person, place, and time.  Skin: Skin is warm and dry. No rash noted.  Psychiatric: He has a normal mood and affect. His behavior is normal.    ED Course  Procedures (including critical care time) Labs Review Labs Reviewed  RPR  HIV  ANTIBODY (ROUTINE TESTING)  URINE CYTOLOGY ANCILLARY ONLY    Imaging Review No results found.   MDM   1. Urethritis   treated empirically for GC and chlamydia and advised if symptoms do not improve, [facility]. May return with clean catch urine specimen when he is able and advised we would be glad to treat him for any additional issues should results indicate.     Jess BartersJennifer Lee Buck RunPresson, GeorgiaPA 06/05/13 [phone]

## 2013-06-05 NOTE — ED Provider Notes (Signed)
Medical screening examination/treatment/procedure(s) were performed by resident physician or non-physician practitioner and as supervising physician I was immediately available for consultation/collaboration.   Barkley BrunsKINDL,Meelah Tallo DOUGLAS MD.   Linna HoffJames D Rolena Knutson, MD 06/05/13 [phone]

## 2013-06-05 NOTE — ED Notes (Signed)
Pt c/o dysuria onset Sunday Also noticed some white penile d/c and having bilateral teste pain Denies inj/trauma, f/v/n/d Alert w/no signs of acute distress.

## 2013-06-05 NOTE — Discharge Instructions (Signed)
You have been treated for gonorrhea and chlamydia here today. If you wish to provide us with a clean catch urine specimen, we would be glad to treat your for any additional issues should the results indicate. You will be contacted by phone if labs indicate you need additional treatment. If symptoms do not improve, [facility]  Urethritis, Adult Urethritis is an inflammation of the tube through which urine exits your bladder (urethra).  CAUSES Urethritis is often caused by an infection in your urethra. The infection can be viral, like herpes. The infection can also be bacterial, like gonorrhea. RISK FACTORS Risk factors of urethritis include:  Having sex without using a condom.  Having multiple sexual partners.  Having poor hygiene. SIGNS AND SYMPTOMS Symptoms of urethritis are less noticeable in women than in men. These symptoms include:  Burning feeling when you urinate (dysuria).  Discharge from your urethra.  Blood in your urine (hematuria).  Urinating more than usual. DIAGNOSIS  To confirm a diagnosis of urethritis, your health care provider will do the following:  Ask about your sexual history.  Perform a physical exam.  Have you provide a sample of your urine for lab testing.  Use a cotton swab to gently collect a sample from your urethra for lab testing. TREATMENT  It is important to treat urethritis. Depending on the cause, untreated urethritis may lead to serious genital infections and possibly infertility. Urethritis caused by a bacterial infection is treated with antibiotics. All sexual partners must be treated.  HOME CARE INSTRUCTIONS  Do not have sex until the test results are known and treatment is completed, even if your symptoms go away before you finish treatment.  Finish all medicines that you are prescribed. SEEK MEDICAL CARE IF:   Your symptoms are not improved in 3 days.  Your symptoms are getting worse.  You  develop abdominal pain or pelvic pain (in women).  You develop joint pain. SEEK IMMEDIATE MEDICAL CARE IF:   You have a fever with a temperature of 101.99F (38.8C) or greater.  You have severe pain in the belly, back, or side.  You have repeated vomiting. Document Released: 07/14/2000 Document Revised: 11/08/2012 Document Reviewed: 09/18/2012 [facility] Patient Information 2014 [place] [place].

## 2013-06-06 LAB — URINE CYTOLOGY ANCILLARY ONLY
Chlamydia: NEGATIVE
Neisseria Gonorrhea: NEGATIVE
Trichomonas: NEGATIVE

## 2013-07-14 ENCOUNTER — Encounter (HOSPITAL_COMMUNITY): Payer: Self-pay | Admitting: Emergency Medicine

## 2013-07-14 ENCOUNTER — Emergency Department (HOSPITAL_COMMUNITY)
Admission: EM | Admit: 2013-07-14 | Discharge: 2013-07-14 | Disposition: A | Discharge status: Home / Self Care | Payer: 59 | Attending: Emergency Medicine | Admitting: Emergency Medicine

## 2013-07-14 DIAGNOSIS — T6391XA Toxic effect of contact with unspecified venomous animal, accidental (unintentional), initial encounter: Secondary | ICD-10-CM | POA: Insufficient documentation

## 2013-07-14 DIAGNOSIS — IMO0002 Reserved for concepts with insufficient information to code with codable children: Secondary | ICD-10-CM | POA: Insufficient documentation

## 2013-07-14 DIAGNOSIS — Y929 Unspecified place or not applicable: Secondary | ICD-10-CM | POA: Insufficient documentation

## 2013-07-14 DIAGNOSIS — T4995XA Adverse effect of unspecified topical agent, initial encounter: Secondary | ICD-10-CM | POA: Insufficient documentation

## 2013-07-14 DIAGNOSIS — T782XXA Anaphylactic shock, unspecified, initial encounter: Secondary | ICD-10-CM | POA: Insufficient documentation

## 2013-07-14 DIAGNOSIS — F17200 Nicotine dependence, unspecified, uncomplicated: Secondary | ICD-10-CM | POA: Insufficient documentation

## 2013-07-14 DIAGNOSIS — Z79899 Other long term (current) drug therapy: Secondary | ICD-10-CM | POA: Insufficient documentation

## 2013-07-14 DIAGNOSIS — Y939 Activity, unspecified: Secondary | ICD-10-CM | POA: Insufficient documentation

## 2013-07-14 DIAGNOSIS — T63461A Toxic effect of venom of wasps, accidental (unintentional), initial encounter: Secondary | ICD-10-CM | POA: Insufficient documentation

## 2013-07-14 DIAGNOSIS — Z8669 Personal history of other diseases of the nervous system and sense organs: Secondary | ICD-10-CM | POA: Insufficient documentation

## 2013-07-14 MED ORDER — SODIUM CHLORIDE 0.9 % IV SOLN
1000.0000 mL | Freq: Once | INTRAVENOUS | Status: AC
  Administered 2013-07-14: 1000 mL via INTRAVENOUS

## 2013-07-14 MED ORDER — FAMOTIDINE IN NACL 20-0.9 MG/50ML-% IV SOLN
20.0000 mg | Freq: Once | INTRAVENOUS | Status: AC
  Administered 2013-07-14: 20 mg via INTRAVENOUS
  Filled 2013-07-14: qty 50

## 2013-07-14 MED ORDER — EPINEPHRINE 0.3 MG/0.3ML IJ SOAJ: 0.3000 mg | INTRAMUSCULAR | Status: DC | PRN

## 2013-07-14 MED ORDER — METHYLPREDNISOLONE SODIUM SUCC 125 MG IJ SOLR
125.0000 mg | Freq: Once | INTRAMUSCULAR | Status: AC
  Administered 2013-07-14: 125 mg via INTRAVENOUS
  Filled 2013-07-14: qty 2

## 2013-07-14 MED ORDER — ALBUTEROL SULFATE HFA 108 (90 BASE) MCG/ACT IN AERS
2.0000 | INHALATION_SPRAY | Freq: Once | RESPIRATORY_TRACT | Status: AC
  Administered 2013-07-14: 2 via RESPIRATORY_TRACT
  Filled 2013-07-14: qty 6.7

## 2013-07-14 MED ORDER — DIPHENHYDRAMINE HCL 25 MG PO TABS: 25.0000 mg | ORAL_TABLET | Freq: Four times a day (QID) | ORAL | Status: DC

## 2013-07-14 MED ORDER — DIPHENHYDRAMINE HCL 50 MG/ML IJ SOLN
50.0000 mg | Freq: Once | INTRAMUSCULAR | Status: AC
  Administered 2013-07-14: 50 mg via INTRAVENOUS
  Filled 2013-07-14: qty 1

## 2013-07-14 MED ORDER — SODIUM CHLORIDE 0.9 % IV SOLN
1000.0000 mL | INTRAVENOUS | Status: DC
  Administered 2013-07-14: 1000 mL via INTRAVENOUS

## 2013-07-14 MED ORDER — EPINEPHRINE 0.3 MG/0.3ML IJ SOAJ
0.3000 mg | Freq: Once | INTRAMUSCULAR | Status: AC
  Administered 2013-07-14: 0.3 mg via INTRAMUSCULAR
  Filled 2013-07-14: qty 0.3

## 2013-07-14 MED ORDER — PREDNISONE 20 MG PO TABS: ORAL_TABLET | ORAL | Status: DC

## 2013-07-14 MED ORDER — FAMOTIDINE 20 MG PO TABS: 20.0000 mg | ORAL_TABLET | Freq: Two times a day (BID) | ORAL | Status: DC

## 2013-07-14 NOTE — ED Provider Notes (Signed)
CSN: [id number]     Arrival date & time 07/14/13  1647 History   First MD Initiated Contact with Patient 07/14/13 1704     Chief Complaint  Patient presents with  . Insect Bite  . Urticaria  . Chest Pain     (Consider location/radiation/quality/duration/timing/severity/associated sxs/prior Treatment) HPI Comments: Pt has developed itch rash, throat tightness/itching, SOB, CP 30 mins after bee sting. 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   Patient is a 48 y.o. male presenting with urticaria, chest pain, and animal bite. The history is provided by the patient.  Urticaria Associated symptoms include chest pain and shortness of breath. Pertinent negatives include no abdominal pain and no headaches.  Chest Pain Pain location:  R chest Pain quality: dull   Pain radiates to:  Does not radiate Pain radiates to the back: no   Pain severity:  Moderate Onset quality:  Sudden Duration:  30 minutes Timing:  Constant Progression:  Unchanged Chronicity:  New Context comment:  After bee sting Associated symptoms: shortness of breath   Associated symptoms: no abdominal pain, no back pain, no cough, no dizziness, no dysphagia, no fatigue, no fever, no headache, no nausea, no numbness, not vomiting and no weakness   Animal Bite Contact animal:  Insect Location:  Torso Torso injury location:  Back Time since incident:  30 minutes Pain details:    Quality:  Burning   Severity:  Mild Incident location:  Outside Associated symptoms: rash   Associated symptoms: no fever and no numbness     Past Medical History  Diagnosis Date  . Cluster headaches    Past Surgical History  Procedure Laterality Date  . Wrist surgery      nerve repair   No family history on file. History  Substance Use Topics  . Smoking status: Current Some Day Smoker -- 0.15 packs/day    Types: Cigarettes  . Smokeless tobacco: Not on file  . Alcohol Use: No    Review of Systems  Constitutional: Negative for  fever, activity change, appetite change and fatigue.  HENT: Negative for congestion, facial swelling, rhinorrhea and trouble swallowing.        Throat itching & tight   Eyes: Negative for photophobia and pain.  Respiratory: Positive for shortness of breath. Negative for cough and chest tightness.   Cardiovascular: Positive for chest pain. Negative for leg swelling.  Gastrointestinal: Negative for nausea, vomiting, abdominal pain, diarrhea and constipation.  Endocrine: Negative for polydipsia and polyuria.  Genitourinary: Negative for dysuria, urgency, decreased urine volume and difficulty urinating.  Musculoskeletal: Negative for back pain and gait problem.  Skin: Positive for rash. Negative for color change and wound.  Allergic/Immunologic: Negative for immunocompromised state.  Neurological: Negative for dizziness, facial asymmetry, speech difficulty, weakness, numbness and headaches.  Psychiatric/Behavioral: Negative for confusion, decreased concentration and agitation.      Allergies  Bee venom and Tramadol  Home Medications   Prior to Admission medications   Medication Sig Start Date End Date Taking? Authorizing Provider  EPINEPHrine 0.3 mg/0.3 mL IJ SOAJ injection Inject 0.3 mg into the muscle once.   Yes Historical Provider, MD  diphenhydrAMINE (BENADRYL) 25 MG tablet Take 1 tablet (25 mg total) by mouth every 6 (six) hours. For 3 days 07/14/13   Shanna CiscoMegan E Docherty, MD  EPINEPHrine (EPIPEN) 0.3 mg/0.3 mL IJ SOAJ injection Inject 0.3 mLs (0.3 mg total) into the muscle as needed. 07/14/13   Shanna CiscoMegan E Docherty, MD  famotidine (PEPCID) 20 MG tablet  Take 1 tablet (20 mg total) by mouth 2 (two) times daily. 07/14/13   Shanna CiscoMegan E Docherty, MD  predniSONE (DELTASONE) 20 MG tablet 3 tabs po daily x 3 days 07/14/13   Shanna CiscoMegan E Docherty, MD   BP 131/82  Pulse 91  Temp(Src) 98.1 F (36.7 C) (Oral)  Resp 16  SpO2 99% Physical Exam  Constitutional: He is oriented to person, place, and time. He  appears well-developed and well-nourished. No distress.  HENT:  Head: Normocephalic and atraumatic.  Mouth/Throat: No oropharyngeal exudate.  Oropharynx clear  Eyes: Pupils are equal, round, and reactive to light.  Neck: Normal range of motion. Neck supple.  Cardiovascular: Normal rate, regular rhythm and normal heart sounds.  Exam reveals no gallop and no friction rub.   No murmur heard. Pulmonary/Chest: Effort normal. No respiratory distress. He has wheezes in the right middle field and the right lower field. He has no rales.  Abdominal: Soft. Bowel sounds are normal. He exhibits no distension and no mass. There is no tenderness. There is no rebound and no guarding.  Musculoskeletal: Normal range of motion. He exhibits no edema and no tenderness.  Neurological: He is alert and oriented to person, place, and time.  Skin: Skin is warm and dry.  Scattered urticaria  Psychiatric: He has a normal mood and affect.    ED Course  Procedures (including critical care time) Labs Review Labs Reviewed - No data to display  Imaging Review No results found.   EKG Interpretation None      MDM   Final diagnoses:  Anaphylaxis    Pt is a 48 y.o. male with Pmhx as above who presents with bee sting about 30 mins ago w/ urticaria, R chest pain, SOB, and itchy throat. HDS currently, airway intact. Slight wheezing heard R lung. S/sx c/w anaphylaxis. Wil initiate treatment w/ epi, benadryl, solumedrol, pepcid, IVF, albuterol MDI and reexamine.   Pt feeling much improved,symptoms resolved. Pt has been monitored in ED for approx 5 hrs. Will d/c home w/ epipen, 3 days of benadryl, prednisone, pepcid. Strict return precautions given for returning or worsening symptoms.       Shanna CiscoMegan E Docherty, MD 07/14/13 2223

## 2013-07-14 NOTE — ED Notes (Signed)
Pt is allergic to bees and got stong about 40 mins ago on his right upper back. Pt has hives on his upper right abd and back where pt was stung.  Pt also c/o right chest pain that started shortly after being stung.  Pt states that he is itchy and throat now starting to feel tight.

## 2013-07-14 NOTE — ED Notes (Signed)
Patient ok to eat per MD. Patient given sandwich.

## 2013-07-14 NOTE — Discharge Instructions (Signed)
Anaphylactic Reaction °An anaphylactic reaction is a sudden, severe allergic reaction that involves the whole body. It can be life threatening. A hospital stay is often required. People with asthma, eczema, or hay fever are slightly more likely to have an anaphylactic reaction. °CAUSES  °An anaphylactic reaction may be caused by anything to which you are allergic. After being exposed to the allergic substance, your immune system becomes sensitized to it. When you are exposed to that allergic substance again, an allergic reaction can occur. Common causes of an anaphylactic reaction include: °· Medicines. °· Foods, especially peanuts, wheat, shellfish, milk, and eggs. °· Insect bites or stings. °· Blood products. °· Chemicals, such as dyes, latex, and contrast material used for imaging tests. °SYMPTOMS  °When an allergic reaction occurs, the body releases histamine and other substances. These substances cause symptoms such as tightening of the airway. Symptoms often develop within seconds or minutes of exposure. Symptoms may include: °· Skin rash or hives. °· Itching. °· Chest tightness. °· Swelling of the eyes, tongue, or lips. °· Trouble breathing or swallowing. °· Lightheadedness or fainting. °· Anxiety or confusion. °· Stomach pains, vomiting, or diarrhea. °· Nasal congestion. °· A fast or irregular heartbeat (palpitations). °DIAGNOSIS  °Diagnosis is based on your history of recent exposure to allergic substances, your symptoms, and a physical exam. Your caregiver may also perform blood or urine tests to confirm the diagnosis. °TREATMENT  °Epinephrine medicine is the main treatment for an anaphylactic reaction. Other medicines that may be used for treatment include antihistamines, steroids, and albuterol. In severe cases, fluids and medicine to support blood pressure may be given through an intravenous line (IV). Even if you improve after treatment, you need to be observed to make sure your condition does not get  worse. This may require a stay in the hospital. °HOME CARE INSTRUCTIONS  °· Wear a medical alert bracelet or necklace stating your allergy. °· You and your family must learn how to use an anaphylaxis kit or give an epinephrine injection to temporarily treat an emergency allergic reaction. Always carry your epinephrine injection or anaphylaxis kit with you. This can be lifesaving if you have a severe reaction. °· Do not drive or perform tasks after treatment until the medicines used to treat your reaction have worn off, or until your caregiver says it is okay. °· If you have hives or a rash: °· Take medicines as directed by your caregiver. °· You may use an over-the-counter antihistamine (diphenhydramine) as needed. °· Apply cold compresses to the skin or take baths in cool water. Avoid hot baths or showers. °SEEK MEDICAL CARE IF:  °· You develop symptoms of an allergic reaction to a new substance. Symptoms may start right away or minutes later. °· You develop a rash, hives, or itching. °· You develop new symptoms. °SEEK IMMEDIATE MEDICAL CARE IF:  °· You have swelling of the mouth, difficulty breathing, or wheezing. °· You have a tight feeling in your chest or throat. °· You develop hives, swelling, or itching all over your body. °· You develop severe vomiting or diarrhea. °· You feel faint or pass out. °This is an emergency. Use your epinephrine injection or anaphylaxis kit as you have been instructed. Call your local emergency services (911 in [place] Even if you improve after the injection, you need to be examined at a hospital emergency department. °MAKE SURE YOU:  °· Understand these instructions. °· Will watch your condition. °· Will get help right away if you are not   doing well or get worse. Document Released: 01/18/2005 Document Revised: 07/20/2011 Document Reviewed: 04/21/2011 [facility] Patient Information 2014 [place], [place].  Bee, Wasp, or Hornet Sting Your caregiver has diagnosed you as having an  insect sting. An insect sting appears as a red lump in the skin that sometimes has a tiny hole in the center, or it may have a stinger in the center of the wound. The most common stings are from wasps, hornets and bees. Individuals have different reactions to insect stings.  A normal reaction may cause pain, swelling, and redness around the sting site.  A localized allergic reaction may cause swelling and redness that extends beyond the sting site.  A large local reaction may continue to develop over the next 12 to 36 hours.  On occasion, the reactions can be severe (anaphylactic reaction). An anaphylactic reaction may cause wheezing; difficulty breathing; chest pain; fainting; raised, itchy, red patches on the skin; a sick feeling to your stomach (nausea); vomiting; cramping; or diarrhea. If you have had an anaphylactic reaction to an insect sting in the past, you are more likely to have one again. HOME CARE INSTRUCTIONS   With bee stings, a small sac of poison is left in the wound. Brushing across this with something such as a credit card, or anything similar, will help remove this and decrease the amount of the reaction. This same procedure will not help a wasp sting as they do not leave behind a stinger and poison sac.  Apply a cold compress for 10 to 20 minutes every hour for 1 to 2 days, depending on severity, to reduce swelling and itching.  To lessen pain, a paste made of water and baking soda may be rubbed on the bite or sting and left on for 5 minutes.  To relieve itching and swelling, you may use take medication or apply medicated creams or lotions as directed.  Only take over-the-counter or prescription medicines for pain, discomfort, or fever as directed by your caregiver.  Wash the sting site daily with soap and water. Apply antibiotic ointment on the sting site as directed.  If you suffered a severe reaction:  If you did not require hospitalization, an adult will need to stay  with you for 24 hours in case the symptoms return.  You may need to wear a medical bracelet or necklace stating the allergy.  You and your family need to learn when and how to use an anaphylaxis kit or epinephrine injection.  If you have had a severe reaction before, always carry your anaphylaxis kit with you. SEEK MEDICAL CARE IF:   None of the above helps within 2 to 3 days.  The area becomes red, warm, tender, and swollen beyond the area of the bite or sting.  You have an oral temperature above 102 F (38.9 C). SEEK IMMEDIATE MEDICAL CARE IF:  You have symptoms of an allergic reaction which are:  Wheezing.  Difficulty breathing.  Chest pain.  Lightheadedness or fainting.  Itchy, raised, red patches on the skin.  Nausea, vomiting, cramping or diarrhea. ANY OF THESE SYMPTOMS MAY REPRESENT A SERIOUS PROBLEM THAT IS AN EMERGENCY. Do not wait to see if the symptoms will go away. Get medical help right away. Call your local emergency services (911 in [place] DO NOT drive yourself to the hospital. MAKE SURE YOU:   Understand these instructions.  Will watch your condition.  Will get help right away if you are not doing well or get worse. Document Released:  01/18/2005 Document Revised: 04/12/2011 Document Reviewed: 07/05/2009 [employer] Patient Information 2014 [place].

## 2013-08-20 ENCOUNTER — Emergency Department (HOSPITAL_COMMUNITY)
Admission: EM | Admit: 2013-08-20 | Discharge: 2013-08-20 | Disposition: A | Discharge status: Home / Self Care | Payer: 59 | Attending: Emergency Medicine | Admitting: Emergency Medicine

## 2013-08-20 ENCOUNTER — Encounter (HOSPITAL_COMMUNITY): Payer: Self-pay | Admitting: Emergency Medicine

## 2013-08-20 DIAGNOSIS — M5441 Lumbago with sciatica, right side: Secondary | ICD-10-CM

## 2013-08-20 DIAGNOSIS — F17200 Nicotine dependence, unspecified, uncomplicated: Secondary | ICD-10-CM | POA: Insufficient documentation

## 2013-08-20 DIAGNOSIS — Z79899 Other long term (current) drug therapy: Secondary | ICD-10-CM | POA: Insufficient documentation

## 2013-08-20 DIAGNOSIS — M5430 Sciatica, unspecified side: Secondary | ICD-10-CM | POA: Insufficient documentation

## 2013-08-20 DIAGNOSIS — Z8669 Personal history of other diseases of the nervous system and sense organs: Secondary | ICD-10-CM | POA: Insufficient documentation

## 2013-08-20 DIAGNOSIS — IMO0002 Reserved for concepts with insufficient information to code with codable children: Secondary | ICD-10-CM | POA: Insufficient documentation

## 2013-08-20 MED ORDER — IBUPROFEN 800 MG PO TABS
800.0000 mg | ORAL_TABLET | Freq: Once | ORAL | Status: AC
Start: 2013-08-20 — End: 2013-08-20
  Administered 2013-08-20: 800 mg via ORAL
  Filled 2013-08-20: qty 1

## 2013-08-20 MED ORDER — IBUPROFEN 800 MG PO TABS: 800.0000 mg | ORAL_TABLET | Freq: Three times a day (TID) | ORAL | Status: DC

## 2013-08-20 MED ORDER — DIAZEPAM 5 MG PO TABS: 5.0000 mg | ORAL_TABLET | Freq: Three times a day (TID) | ORAL | Status: DC | PRN

## 2013-08-20 MED ORDER — DIAZEPAM 5 MG PO TABS
5.0000 mg | ORAL_TABLET | Freq: Once | ORAL | Status: AC
  Administered 2013-08-20: 5 mg via ORAL
  Filled 2013-08-20: qty 1

## 2013-08-20 NOTE — ED Provider Notes (Signed)
Medical screening examination/treatment/procedure(s) were performed by non-physician practitioner and as supervising physician I was immediately available for consultation/collaboration.   EKG Interpretation None        Rolan BuccoMelanie Abdifatah Colquhoun, MD 08/20/13 2325

## 2013-08-20 NOTE — Discharge Instructions (Signed)
Sciatica Sciatica is pain, weakness, numbness, or tingling along the path of the sciatic nerve. The nerve starts in the lower back and runs down the back of each leg. The nerve controls the muscles in the lower leg and in the back of the knee, while also providing sensation to the back of the thigh, lower leg, and the sole of your foot. Sciatica is a symptom of another medical condition. For instance, nerve damage or certain conditions, such as a herniated disk or bone spur on the spine, pinch or put pressure on the sciatic nerve. This causes the pain, weakness, or other sensations normally associated with sciatica. Generally, sciatica only affects one side of the body. CAUSES   Herniated or slipped disc.  Degenerative disk disease.  A pain disorder involving the narrow muscle in the buttocks (piriformis syndrome).  Pelvic injury or fracture.  Pregnancy.  Tumor (rare). SYMPTOMS  Symptoms can vary from mild to very severe. The symptoms usually travel from the low back to the buttocks and down the back of the leg. Symptoms can include:  Mild tingling or dull aches in the lower back, leg, or hip.  Numbness in the back of the calf or sole of the foot.  Burning sensations in the lower back, leg, or hip.  Sharp pains in the lower back, leg, or hip.  Leg weakness.  Severe back pain inhibiting movement. These symptoms may get worse with coughing, sneezing, laughing, or prolonged sitting or standing. Also, being overweight may worsen symptoms. DIAGNOSIS  Your caregiver will perform a physical exam to look for common symptoms of sciatica. He or she may ask you to do certain movements or activities that would trigger sciatic nerve pain. Other tests may be performed to find the cause of the sciatica. These may include:  Blood tests.  X-rays.  Imaging tests, such as an MRI or CT scan. TREATMENT  Treatment is directed at the cause of the sciatic pain. Sometimes, treatment is not necessary  and the pain and discomfort goes away on its own. If treatment is needed, your caregiver may suggest:  Over-the-counter medicines to relieve pain.  Prescription medicines, such as anti-inflammatory medicine, muscle relaxants, or narcotics.  Applying heat or ice to the painful area.  Steroid injections to lessen pain, irritation, and inflammation around the nerve.  Reducing activity during periods of pain.  Exercising and stretching to strengthen your abdomen and improve flexibility of your spine. Your caregiver may suggest losing weight if the extra weight makes the back pain worse.  Physical therapy.  Surgery to eliminate what is pressing or pinching the nerve, such as a bone spur or part of a herniated disk. HOME CARE INSTRUCTIONS   Only take over-the-counter or prescription medicines for pain or discomfort as directed by your caregiver.  Apply ice to the affected area for 20 minutes, 3-4 times a day for the first 48-72 hours. Then try heat in the same way.  Exercise, stretch, or perform your usual activities if these do not aggravate your pain.  Attend physical therapy sessions as directed by your caregiver.  Keep all follow-up appointments as directed by your caregiver.  Do not wear high heels or shoes that do not provide proper support.  Check your mattress to see if it is too soft. A firm mattress may lessen your pain and discomfort. SEEK IMMEDIATE MEDICAL CARE IF:   You lose control of your bowel or bladder (incontinence).  You have increasing weakness in the lower back, pelvis, buttocks,   or legs.  You have redness or swelling of your back.  You have a burning sensation when you urinate.  You have pain that gets worse when you lie down or awakens you at night.  Your pain is worse than you have experienced in the past.  Your pain is lasting longer than 4 weeks.  You are suddenly losing weight without reason. MAKE SURE YOU:  Understand these  instructions.  Will watch your condition.  Will get help right away if you are not doing well or get worse. Document Released: 01/12/2001 Document Revised: 07/20/2011 Document Reviewed: 05/30/2011 [employer] Patient Information 2015 [employer]. This information is not intended to replace advice given to you by your health care provider. Make sure you discuss any questions you have with your health care provider.  

## 2013-08-20 NOTE — ED Notes (Signed)
Pt states that today he bent over then he couldn't get back up due to the lower back pain. Pt ambulated well into triage room from lobby.

## 2013-08-20 NOTE — Progress Notes (Signed)
[facility] provided pt with a list of primary care resources, highlighting [facility] for resources to obtain photo ID. Also, [facility] to help patient establish a pcp.

## 2013-08-20 NOTE — ED Provider Notes (Signed)
CSN: [id number]     Arrival date & time 08/20/13  1344 History  This chart was scribed for non-physician practitioner, Roxy Horseman, PA-C working with Rolan Bucco, MD by Greggory Stallion, ED scribe. This patient was seen in room WTR9/WTR9 and the patient's care was started at 3:20 PM.    Chief Complaint  Patient presents with  . Back Pain   The history is provided by the patient. No language interpreter was used.   HPI Comments: Raymond Mcclure is a 48 y.o. male who presents to the Emergency Department complaining of sudden onset lower back pain that started earlier today when he went to bend over. States he wasn't lifting anything when the pain started. Pain intermittently radiates into his right leg. Certain movements worsen the pain. Denies history of back problems or surgeries. Pt has not taken anything for his symptoms. Denies fever, chills, bowel or bladder incontinence.   Past Medical History  Diagnosis Date  . Cluster headaches    Past Surgical History  Procedure Laterality Date  . Wrist surgery      nerve repair   No family history on file. History  Substance Use Topics  . Smoking status: Current Some Day Smoker -- 0.15 packs/day    Types: Cigarettes  . Smokeless tobacco: Not on file  . Alcohol Use: No    Review of Systems  Constitutional: Negative for fever and chills.  HENT: Negative for congestion.   Eyes: Negative for redness.  Respiratory: Negative for shortness of breath.   Cardiovascular: Negative for chest pain.  Gastrointestinal: Negative for abdominal distention.  Genitourinary:       Negative for bowel or bladder incontinence.  Musculoskeletal: Positive for back pain and myalgias.  Skin: Negative for rash.  Neurological: Negative for speech difficulty.  Psychiatric/Behavioral: Negative for confusion.   Allergies  Bee venom and Tramadol  Home Medications   Prior to Admission medications   Medication Sig Start Date End Date Taking? Authorizing  Provider  diphenhydrAMINE (BENADRYL) 25 MG tablet Take 1 tablet (25 mg total) by mouth every 6 (six) hours. For 3 days 07/14/13   Shanna Cisco, MD  EPINEPHrine (EPIPEN) 0.3 mg/0.3 mL IJ SOAJ injection Inject 0.3 mLs (0.3 mg total) into the muscle as needed. 07/14/13   Shanna Cisco, MD  EPINEPHrine 0.3 mg/0.3 mL IJ SOAJ injection Inject 0.3 mg into the muscle once.    Historical Provider, MD  famotidine (PEPCID) 20 MG tablet Take 1 tablet (20 mg total) by mouth 2 (two) times daily. 07/14/13   Shanna Cisco, MD  predniSONE (DELTASONE) 20 MG tablet 3 tabs po daily x 3 days 07/14/13   Shanna Cisco, MD   BP 128/75  Pulse 107  Temp(Src) 97.6 F (36.4 C) (Oral)  Resp 16  SpO2 98%  Physical Exam  Nursing note and vitals reviewed. Constitutional: He is oriented to person, place, and time. He appears well-developed and well-nourished. No distress.  HENT:  Head: Normocephalic and atraumatic.  Eyes: Conjunctivae and EOM are normal. Right eye exhibits no discharge. Left eye exhibits no discharge. No scleral icterus.  Neck: Normal range of motion. Neck supple. No tracheal deviation present.  Cardiovascular: Normal rate, regular rhythm and normal heart sounds.  Exam reveals no gallop and no friction rub.   No murmur heard. Pulmonary/Chest: Effort normal and breath sounds normal. No stridor. No respiratory distress. He has no wheezes.  Abdominal: Soft. He exhibits no distension. There is no tenderness.  Musculoskeletal: Normal range  of motion. He exhibits no edema.  Right sided lumbar paraspinal muscles tender to palpation, no bony tenderness, step-offs, or gross abnormality or deformity of spine, patient is able to ambulate, moves all extremities  Bilateral great toe extension intact Bilateral plantar/dorsiflexion intact  Neurological: He is alert and oriented to person, place, and time. He has normal reflexes.  Sensation and strength intact bilaterally Symmetrical reflexes  Skin: Skin  is warm and dry. He is not diaphoretic.  Psychiatric: He has a normal mood and affect. His behavior is normal. Judgment and thought content normal.    ED Course  Procedures (including critical care time)  DIAGNOSTIC STUDIES: Oxygen Saturation is 98% on RA, normal by my interpretation.    COORDINATION OF CARE: 3:23 PM-Discussed treatment plan which includes an anti-inflammatory and valium with pt at bedside and pt agreed to plan. Will give pt an orthopedic referral and advised him to follow up if symptoms do not started resolving in 7-10 days. Return precautions given.   Labs Review Labs Reviewed - No data to display  Imaging Review No results found.   EKG Interpretation None      MDM   Final diagnoses:  Right-sided low back pain with right-sided sciatica    Patient with back pain.  No neurological deficits and normal neuro exam.  Patient is ambulatory.  No loss of bowel or bladder control.  Doubt cauda equina.  Denies fever,  doubt epidural abscess or other lesion. Recommend back exercises, stretching, RICE.  Encouraged the patient that there could be a need for additional workup and/or imaging such as MRI, if the symptoms do not resolve. Patient advised that if the back pain does not resolve, or radiates, this could progress to more serious conditions and is encouraged to follow-up with PCP or orthopedics within 2 weeks.     I personally performed the services described in this documentation, which was scribed in my presence. The recorded information has been reviewed and is accurate.  Roxy Horsemanobert Braulio Kiedrowski, PA-C 08/20/13 2006

## 2013-08-23 ENCOUNTER — Emergency Department (HOSPITAL_COMMUNITY)
Admission: EM | Admit: 2013-08-23 | Discharge: 2013-08-23 | Disposition: A | Discharge status: Home / Self Care | Payer: 59 | Attending: Emergency Medicine | Admitting: Emergency Medicine

## 2013-08-23 ENCOUNTER — Encounter (HOSPITAL_COMMUNITY): Payer: Self-pay | Admitting: Emergency Medicine

## 2013-08-23 DIAGNOSIS — M545 Low back pain, unspecified: Secondary | ICD-10-CM | POA: Insufficient documentation

## 2013-08-23 DIAGNOSIS — M5442 Lumbago with sciatica, left side: Secondary | ICD-10-CM

## 2013-08-23 DIAGNOSIS — F17200 Nicotine dependence, unspecified, uncomplicated: Secondary | ICD-10-CM | POA: Insufficient documentation

## 2013-08-23 DIAGNOSIS — M5430 Sciatica, unspecified side: Secondary | ICD-10-CM | POA: Insufficient documentation

## 2013-08-23 DIAGNOSIS — Z79899 Other long term (current) drug therapy: Secondary | ICD-10-CM | POA: Insufficient documentation

## 2013-08-23 DIAGNOSIS — M5441 Lumbago with sciatica, right side: Secondary | ICD-10-CM

## 2013-08-23 DIAGNOSIS — Z791 Long term (current) use of non-steroidal anti-inflammatories (NSAID): Secondary | ICD-10-CM | POA: Insufficient documentation

## 2013-08-23 DIAGNOSIS — Z8669 Personal history of other diseases of the nervous system and sense organs: Secondary | ICD-10-CM | POA: Insufficient documentation

## 2013-08-23 MED ORDER — OXYCODONE-ACETAMINOPHEN 5-325 MG PO TABS: 1.0000 | ORAL_TABLET | Freq: Four times a day (QID) | ORAL | Status: DC | PRN

## 2013-08-23 MED ORDER — OXYCODONE-ACETAMINOPHEN 5-325 MG PO TABS
1.0000 | ORAL_TABLET | Freq: Once | ORAL | Status: AC
  Administered 2013-08-23: 1 via ORAL
  Filled 2013-08-23: qty 1

## 2013-08-23 MED ORDER — LIDOCAINE 5 % EX PTCH: 1.0000 | MEDICATED_PATCH | CUTANEOUS | Status: DC

## 2013-08-23 MED ORDER — LIDOCAINE 5 % EX PTCH
1.0000 | MEDICATED_PATCH | Freq: Every day | CUTANEOUS | Status: DC
  Administered 2013-08-23: 1 via TRANSDERMAL
  Filled 2013-08-23: qty 1

## 2013-08-23 NOTE — ED Provider Notes (Signed)
CSN: [id number]     Arrival date & time 08/23/13  7829 History   First MD Initiated Contact with Patient 08/23/13 [phone]     Chief Complaint  Patient presents with  . Back Pain     (Consider location/radiation/quality/duration/timing/severity/associated sxs/prior Treatment) HPI Comments: Patient is a 48 year old male past medical history significant for headaches presenting to the emergency department for reevaluation of low back pain that began 3 days ago. States he developed low back pain when he stood up from bending over. States he has been taking the Valium and ibuprofen with little to no improvement of his pain. Hinders his low back is stiff with intermittent pain radiating down both legs. Denies any new injuries. Denies any fevers, chills, sweats, bladder or bowel incontinence, urinary symptoms, history of cancer or IV drug use.  Patient is a 48 y.o. male presenting with back pain.  Back Pain Associated symptoms: no fever     Past Medical History  Diagnosis Date  . Cluster headaches    Past Surgical History  Procedure Laterality Date  . Wrist surgery      nerve repair   History reviewed. No pertinent family history. History  Substance Use Topics  . Smoking status: Current Some Day Smoker -- 0.15 packs/day    Types: Cigarettes  . Smokeless tobacco: Not on file  . Alcohol Use: No    Review of Systems  Constitutional: Negative for fever and chills.  Musculoskeletal: Positive for back pain.  All other systems reviewed and are negative.     Allergies  Bee venom and Tramadol  Home Medications   Prior to Admission medications   Medication Sig Start Date End Date Taking? Authorizing Provider  diazepam (VALIUM) 5 MG tablet Take 1 tablet (5 mg total) by mouth every 8 (eight) hours as needed for anxiety. 08/20/13  Yes Roxy Horseman, PA-C  diphenhydrAMINE (BENADRYL) 25 MG tablet Take 25 mg by mouth every 6 (six) hours as needed for itching or allergies. Allergic reaction    Yes Historical Provider, MD  EPINEPHrine 0.3 mg/0.3 mL IJ SOAJ injection Inject 0.3 mg into the muscle daily as needed. Allergic reaction   Yes Historical Provider, MD  ibuprofen (ADVIL,MOTRIN) 800 MG tablet Take 800 mg by mouth every 8 (eight) hours as needed for moderate pain.   Yes Historical Provider, MD  naproxen sodium (ANAPROX) 220 MG tablet Take 440 mg by mouth 2 (two) times daily as needed (pain).   Yes Historical Provider, MD  lidocaine (LIDODERM) 5 % Place 1 patch onto the skin daily. Remove & Discard patch within 12 hours or as directed by MD 08/23/13   Lise Auer Foy Vanduyne, PA-C  oxyCODONE-acetaminophen (PERCOCET) 5-325 MG per tablet Take 1-2 tablets by mouth every 6 (six) hours as needed for severe pain. 08/23/13   Preeti Winegardner L Wellington Winegarden, PA-C   BP 118/73  Pulse 79  Temp(Src) 97.6 F (36.4 C) (Oral)  Resp 18  SpO2 100% Physical Exam  Nursing note and vitals reviewed. Constitutional: He is oriented to person, place, and time. He appears well-developed and well-nourished. No distress.  HENT:  Head: Normocephalic and atraumatic.  Right Ear: External ear normal.  Left Ear: External ear normal.  Nose: Nose normal.  Mouth/Throat: Oropharynx is clear and moist. No oropharyngeal exudate.  Eyes: Conjunctivae and EOM are normal. Pupils are equal, round, and reactive to light.  Neck: Normal range of motion. Neck supple.  Cardiovascular: Normal rate, regular rhythm, normal heart sounds and intact distal pulses.   Pulmonary/Chest:  Effort normal and breath sounds normal. No respiratory distress.  Abdominal: Soft. There is no tenderness.  Musculoskeletal:       Lumbar back: He exhibits tenderness, pain and spasm. He exhibits normal range of motion, no bony tenderness, no swelling, no edema, no deformity, no laceration and normal pulse.  Neurological: He is alert and oriented to person, place, and time. He has normal strength. No cranial nerve deficit. Gait normal. GCS eye subscore is 4.  GCS verbal subscore is 5. GCS motor subscore is 6.  Sensation grossly intact.  No pronator drift.  Bilateral heel-knee-shin intact.  Skin: Skin is warm and dry. He is not diaphoretic.    ED Course  Procedures (including critical care time) Medications  oxyCODONE-acetaminophen (PERCOCET/ROXICET) 5-325 MG per tablet 1 tablet (1 tablet Oral Given 08/23/13 0924)    Labs Review Labs Reviewed - No data to display  Imaging Review No results found.   EKG Interpretation None      Patient states he has tolerated Percocet before, without allergic reaction.  MDM   Final diagnoses:  Bilateral low back pain with sciatica, sciatica laterality unspecified    Filed Vitals:   08/23/13 0844  BP: 118/73  Pulse: 79  Temp: 97.6 F (36.4 C)  Resp: 18   Afebrile, NAD, non-toxic appearing, AAOx4.   Patient with back pain.  No neurological deficits and normal neuro exam.  Patient can walk but states is painful.  No loss of bowel or bladder control.  No concern for cauda equina.  No fever, night sweats, weight loss, h/o cancer, IVDU.  RICE protocol and pain medicine indicated and discussed with patient.    Patient is stable at time of discharge   Jeannetta EllisJennifer L Amaree Leeper, PA-C 08/23/13 1522

## 2013-08-23 NOTE — ED Provider Notes (Signed)
Medical screening examination/treatment/procedure(s) were performed by non-physician practitioner and as supervising physician I was immediately available for consultation/collaboration.   EKG Interpretation None      Lunabella Badgett, MD, FACEP   Yehia Mcbain L Stryker Veasey, MD 08/23/13 1602 

## 2013-08-23 NOTE — Progress Notes (Signed)
[facility] did not see patient but will be sending information about [facility] to help patient establish primary care, using the address provided. CL provided pt with same information on last ED visit 7/20.

## 2013-08-23 NOTE — ED Notes (Signed)
Per pt, seen 3 days ago for back pain which started with bending.  Pt given meds and pain continues

## 2013-08-23 NOTE — Discharge Instructions (Signed)
Please follow up with your primary care physician in 1-2 days. If you do not have one please call the [facility] number listed above. Please take pain medication and/or muscle relaxants as prescribed and as needed for pain. Please do not drive on narcotic pain medication or on muscle relaxants. Please read all discharge instructions and return precautions.  ° °Back Pain, Adult °Low back pain is very common. About 1 in 5 people have back pain. The cause of low back pain is rarely dangerous. The pain often gets better over time. About half of people with a sudden onset of back pain feel better in just 2 weeks. About 8 in 10 people feel better by 6 weeks.  °CAUSES °Some common causes of back pain include: °· Strain of the muscles or ligaments supporting the spine. °· Wear and tear (degeneration) of the spinal discs. °· Arthritis. °· Direct injury to the back. °DIAGNOSIS °Most of the time, the direct cause of low back pain is not known. However, back pain can be treated effectively even when the exact cause of the pain is unknown. Answering your caregiver's questions about your overall health and symptoms is one of the most accurate ways to make sure the cause of your pain is not dangerous. If your caregiver needs more information, he or she may order lab work or imaging tests (X-rays or MRIs). However, even if imaging tests show changes in your back, this usually does not require surgery. °HOME CARE INSTRUCTIONS °For many people, back pain returns. Since low back pain is rarely dangerous, it is often a condition that people can learn to manage on their own.  °· Remain active. It is stressful on the back to sit or stand in one place. Do not sit, drive, or stand in one place for more than 30 minutes at a time. Take short walks on level surfaces as soon as pain allows. Try to increase the length of time you walk each day. °· Do not stay in bed. Resting more than 1 or 2 days can delay your  recovery. °· Do not avoid exercise or work. Your body is made to move. It is not dangerous to be active, even though your back may hurt. Your back will likely heal faster if you return to being active before your pain is gone. °· Pay attention to your body when you  bend and lift. Many people have less discomfort when lifting if they bend their knees, keep the load close to their bodies, and avoid twisting. Often, the most comfortable positions are those that put less stress on your recovering back. °· Find a comfortable position to sleep. Use a firm mattress and lie on your side with your knees slightly bent. If you lie on your back, put a pillow under your knees. °· Only take over-the-counter or prescription medicines as directed by your caregiver. Over-the-counter medicines to reduce pain and inflammation are often the most helpful. Your caregiver may prescribe muscle relaxant drugs. These medicines help dull your pain so you can more quickly return to your normal activities and healthy exercise. °· Put ice on the injured area. °¨ Put ice in a plastic bag. °¨ Place a towel between your skin and the bag. °¨ Leave the ice on for 15-20 minutes, 03-04 times a day for the first 2 to 3 days. After that, ice and heat may be alternated to reduce pain and spasms. °· Ask your caregiver about trying back exercises and gentle massage. This may be of some benefit. °· Avoid feeling anxious or stressed. Stress increases muscle tension and   can worsen back pain. It is important to recognize when you are anxious or stressed and learn ways to manage it. Exercise is a great option. °SEEK MEDICAL CARE IF: °· You have pain that is not relieved with rest or medicine. °· You have pain that does not improve in 1 week. °· You have new symptoms. °· You are generally not feeling well. °SEEK IMMEDIATE MEDICAL CARE IF:  °· You have pain that radiates from your back into your legs. °· You develop new bowel or bladder control problems. °· You  have unusual weakness or numbness in your arms or legs. °· You develop nausea or vomiting. °· You develop abdominal pain. °· You feel faint. °Document Released: 01/18/2005 Document Revised: 07/20/2011 Document Reviewed: 05/22/2013 °[employer]® Patient Information ©2015 [employer]. This information is not intended to replace advice given to you by your health care provider. Make sure you discuss any questions you have with your health care provider. ° °

## 2013-10-01 ENCOUNTER — Encounter (HOSPITAL_COMMUNITY): Payer: Self-pay | Admitting: Emergency Medicine

## 2013-10-01 ENCOUNTER — Emergency Department (HOSPITAL_COMMUNITY)
Admission: EM | Admit: 2013-10-01 | Discharge: 2013-10-01 | Disposition: A | Discharge status: Home / Self Care | Payer: 59 | Attending: Emergency Medicine | Admitting: Emergency Medicine

## 2013-10-01 ENCOUNTER — Emergency Department (HOSPITAL_COMMUNITY): Payer: 59

## 2013-10-01 DIAGNOSIS — Z791 Long term (current) use of non-steroidal anti-inflammatories (NSAID): Secondary | ICD-10-CM | POA: Insufficient documentation

## 2013-10-01 DIAGNOSIS — R079 Chest pain, unspecified: Secondary | ICD-10-CM | POA: Insufficient documentation

## 2013-10-01 DIAGNOSIS — J069 Acute upper respiratory infection, unspecified: Secondary | ICD-10-CM

## 2013-10-01 DIAGNOSIS — F17200 Nicotine dependence, unspecified, uncomplicated: Secondary | ICD-10-CM | POA: Insufficient documentation

## 2013-10-01 DIAGNOSIS — Z8659 Personal history of other mental and behavioral disorders: Secondary | ICD-10-CM | POA: Insufficient documentation

## 2013-10-01 DIAGNOSIS — Z79899 Other long term (current) drug therapy: Secondary | ICD-10-CM | POA: Insufficient documentation

## 2013-10-01 DIAGNOSIS — Z87891 Personal history of nicotine dependence: Secondary | ICD-10-CM

## 2013-10-01 MED ORDER — BENZONATATE 100 MG PO CAPS: 100.0000 mg | ORAL_CAPSULE | Freq: Three times a day (TID) | ORAL | Status: DC

## 2013-10-01 MED ORDER — DM-GUAIFENESIN ER 30-600 MG PO TB12: 1.0000 | ORAL_TABLET | Freq: Two times a day (BID) | ORAL | Status: DC

## 2013-10-01 NOTE — ED Notes (Signed)
Pt placed on cardiac monitor, cont. Pulse ox, bp monitoring in treatment room.

## 2013-10-01 NOTE — ED Notes (Signed)
Pt reports having productive cough and burning chest pain that started on Friday, now chest pain is constant. No acute distress noted at [facility], ekg done.

## 2013-10-01 NOTE — Discharge Instructions (Signed)
Call for a follow up appointment with a Family or Primary Care Provider.  °Return if Symptoms worsen.   °Take medication as prescribed.  °Drink plenty of fluids. °

## 2013-10-01 NOTE — ED Provider Notes (Signed)
CSN: [id number]     Arrival date & time 10/01/13  4098 History   First MD Initiated Contact with Patient 10/01/13 [phone]     Chief Complaint  Patient presents with  . Chest Pain     (Consider location/radiation/quality/duration/timing/severity/associated sxs/prior Treatment) HPI Comments: The patient is a 48 year old male presenting to the emergency room and chief complaint of cough, chest discomfort, nasal congestion for 3 days. The patient reports worsening cough, productive with green sputum. Reports fever, MAXIMUM TEMPERATURE 102, last night. Denies Tylenol, ibuprofen use today.  He reports nasal congestion for 3 days.  Reports chest discomfort as central, burning worsened with cough.  Denies sore throat. He reports he recently watched his grandchildren over the last week and unsure if they were sick. PCP Orson Aloe in the past, unknown new provider  Patient is a 48 y.o. male presenting with chest pain. The history is provided by the patient. No language interpreter was used.  Chest Pain Associated symptoms: cough and fever     Past Medical History  Diagnosis Date  . Cluster headaches    Past Surgical History  Procedure Laterality Date  . Wrist surgery      nerve repair   History reviewed. No pertinent family history. History  Substance Use Topics  . Smoking status: Current Some Day Smoker -- 0.15 packs/day    Types: Cigarettes  . Smokeless tobacco: Not on file  . Alcohol Use: No    Review of Systems  Constitutional: Positive for fever.  HENT: Positive for congestion and rhinorrhea.   Respiratory: Positive for cough and chest tightness.   Cardiovascular: Negative for leg swelling.      Allergies  Bee venom and Tramadol  Home Medications   Prior to Admission medications   Medication Sig Start Date End Date Taking? Authorizing Provider  diazepam (VALIUM) 5 MG tablet Take 1 tablet (5 mg total) by mouth every 8 (eight) hours as needed for anxiety. 08/20/13   Roxy Horseman, PA-C  diphenhydrAMINE (BENADRYL) 25 MG tablet Take 25 mg by mouth every 6 (six) hours as needed for itching or allergies. Allergic reaction    Historical Provider, MD  EPINEPHrine 0.3 mg/0.3 mL IJ SOAJ injection Inject 0.3 mg into the muscle daily as needed. Allergic reaction    Historical Provider, MD  ibuprofen (ADVIL,MOTRIN) 800 MG tablet Take 800 mg by mouth every 8 (eight) hours as needed for moderate pain.    Historical Provider, MD  lidocaine (LIDODERM) 5 % Place 1 patch onto the skin daily. Remove & Discard patch within 12 hours or as directed by MD 08/23/13   Lise Auer Piepenbrink, PA-C  naproxen sodium (ANAPROX) 220 MG tablet Take 440 mg by mouth 2 (two) times daily as needed (pain).    Historical Provider, MD  oxyCODONE-acetaminophen (PERCOCET) 5-325 MG per tablet Take 1-2 tablets by mouth every 6 (six) hours as needed for severe pain. 08/23/13   Jennifer L Piepenbrink, PA-C   BP 126/78  Pulse 81  Temp(Src) 98.1 F (36.7 C) (Oral)  Resp 18  SpO2 100% Physical Exam  Nursing note and vitals reviewed. Constitutional: He is oriented to person, place, and time. He appears well-developed and well-nourished. No distress.  HENT:  Head: Normocephalic and atraumatic.  Right Ear: Tympanic membrane and external ear normal. Tympanic membrane is not erythematous and not retracted.  Left Ear: External ear normal. Tympanic membrane is retracted.  Nose: Rhinorrhea present.  Mouth/Throat: Uvula is midline. Mucous membranes are not dry. Posterior oropharyngeal erythema present.  No oropharyngeal exudate or posterior oropharyngeal edema.  Geographic tongue   Eyes: EOM are normal.  Neck: Neck supple.  Cardiovascular: Normal rate and regular rhythm.   No lower extremity edema  Pulmonary/Chest: Effort normal and breath sounds normal. He has no wheezes. He has no rales. He exhibits tenderness.    Patient is able to speak in complete sentences.   Lymphadenopathy:       Head (right side):  No submental, no submandibular, no tonsillar, no preauricular, no posterior auricular and no occipital adenopathy present.       Head (left side): No submental, no submandibular, no tonsillar, no preauricular, no posterior auricular and no occipital adenopathy present.    He has no cervical adenopathy.  Neurological: He is alert and oriented to person, place, and time.  Skin: Skin is warm and dry. No rash noted. He is not diaphoretic.  Psychiatric: He has a normal mood and affect. His behavior is normal.    ED Course  Procedures (including critical care time) Labs Review Labs Reviewed - No data to display  Imaging Review Dg Chest 2 View  10/01/2013   CLINICAL DATA:  Cough with chest pain.  EXAM: CHEST  2 VIEW  COMPARISON:  11/01/2006.  FINDINGS: The lungs are clear without focal infiltrate, edema, pneumothorax or pleural effusion. Interstitial markings are diffusely coarsened with chronic features. The cardiopericardial silhouette is within normal limits for size. Imaged bony structures of the thorax are intact.  IMPRESSION: Mild chronic interstitial coarsening. No acute cardiopulmonary process.   Electronically Signed   By: Kennith Center M.D.   On: 10/01/2013 10:18     EKG Interpretation   Date/Time:  Monday October 01 2013 09:37:32 EDT Ventricular Rate:  81 PR Interval:  176 QRS Duration: 76 QT Interval:  364 QTC Calculation: 422 R Axis:   69 Text Interpretation:  Normal sinus rhythm Septal infarct , age  undetermined Abnormal ECG No significant change since last tracing  Confirmed by ZACKOWSKI  MD, SCOTT [phone]) on 10/01/2013 9:56:14 AM      MDM   Final diagnoses:  URI (upper respiratory infection)  History of tobacco abuse   Pt CXR negative for acute infiltrate. Patients symptoms are consistent with URI, likely viral etiology. Discussed that antibiotics are not indicated for viral infections. Pt will be discharged with symptomatic treatment.  Verbalizes understanding and is  agreeable with plan. Pt is hemodynamically stable & in NAD prior to dc.  Meds given in ED:  Medications - No data to display  Discharge Medication List as of 10/01/2013 10:35 AM    START taking these medications   Details  benzonatate (TESSALON) 100 MG capsule Take 1 capsule (100 mg total) by mouth every 8 (eight) hours., Starting 10/01/2013, Until Discontinued, Print    dextromethorphan-guaiFENesin (MUCINEX DM) 30-600 MG per 12 hr tablet Take 1 tablet by mouth 2 (two) times daily., Starting 10/01/2013, Until Discontinued, Print          Mellody Drown, PA-C 10/02/13 1040

## 2013-10-03 NOTE — ED Provider Notes (Signed)
Medical screening examination/treatment/procedure(s) were performed by non-physician practitioner and as supervising physician I was immediately available for consultation/collaboration.   EKG Interpretation   Date/Time:  Monday October 01 2013 09:37:32 EDT Ventricular Rate:  81 PR Interval:  176 QRS Duration: 76 QT Interval:  364 QTC Calculation: 422 R Axis:   69 Text Interpretation:  Normal sinus rhythm Septal infarct , age  undetermined Abnormal ECG No significant change since last tracing  Confirmed by Brently Voorhis  MD, Firmin Belisle [phone]) on 10/01/2013 9:56:14 AM        Vanetta Mulders, MD 10/03/13 [phone]

## 2013-10-22 ENCOUNTER — Emergency Department (HOSPITAL_COMMUNITY)
Admission: EM | Admit: 2013-10-22 | Discharge: 2013-10-22 | Disposition: A | Discharge status: Home / Self Care | Payer: 59 | Attending: Emergency Medicine | Admitting: Emergency Medicine

## 2013-10-22 ENCOUNTER — Encounter (HOSPITAL_COMMUNITY): Payer: Self-pay | Admitting: Emergency Medicine

## 2013-10-22 DIAGNOSIS — M549 Dorsalgia, unspecified: Secondary | ICD-10-CM | POA: Insufficient documentation

## 2013-10-22 DIAGNOSIS — F17200 Nicotine dependence, unspecified, uncomplicated: Secondary | ICD-10-CM | POA: Insufficient documentation

## 2013-10-22 DIAGNOSIS — M546 Pain in thoracic spine: Secondary | ICD-10-CM | POA: Insufficient documentation

## 2013-10-22 DIAGNOSIS — Z8669 Personal history of other diseases of the nervous system and sense organs: Secondary | ICD-10-CM | POA: Insufficient documentation

## 2013-10-22 MED ORDER — METHOCARBAMOL 500 MG PO TABS
1000.0000 mg | ORAL_TABLET | Freq: Once | ORAL | Status: AC
  Administered 2013-10-22: 1000 mg via ORAL
  Filled 2013-10-22: qty 2

## 2013-10-22 MED ORDER — IBUPROFEN 400 MG PO TABS
800.0000 mg | ORAL_TABLET | Freq: Once | ORAL | Status: AC
  Administered 2013-10-22: 800 mg via ORAL
  Filled 2013-10-22: qty 2

## 2013-10-22 MED ORDER — METHOCARBAMOL 500 MG PO TABS: 1000.0000 mg | ORAL_TABLET | Freq: Four times a day (QID) | ORAL | Status: DC | PRN

## 2013-10-22 NOTE — ED Provider Notes (Signed)
CSN: [id number]     Arrival date & time 10/22/13  1701 History  This chart was scribed for non-physician practitioner, Wynetta Emery, PA-C working with Mirian Mo, MD by Greggory Stallion, ED scribe. This patient was seen in room TR09C/TR09C and the patient's care was started at 7:58 PM.    Chief Complaint  Patient presents with  . Back Pain   The history is provided by the patient. No language interpreter was used.   HPI Comments: Raymond Mcclure is a 48 y.o. male who presents to the Emergency Department complaining of right mid back pain that started one month ago. Rates pain 9/10. Denies injury. Palpation worsens the pain. He has taken aspirin and used rubbing alcohol with no relief. Denies fever, cough, SOB. Pt does not have a PCP.  Past Medical History  Diagnosis Date  . Cluster headaches    Past Surgical History  Procedure Laterality Date  . Wrist surgery      nerve repair   No family history on file. History  Substance Use Topics  . Smoking status: Current Some Day Smoker -- 0.15 packs/day    Types: Cigarettes  . Smokeless tobacco: Not on file  . Alcohol Use: No    Review of Systems  Constitutional: Negative for fever.  Respiratory: Negative for cough and shortness of breath.   Musculoskeletal: Positive for back pain.  All other systems reviewed and are negative.  Allergies  Bee venom and Tramadol  Home Medications   Prior to Admission medications   Medication Sig Start Date End Date Taking? Authorizing Provider  benzonatate (TESSALON) 100 MG capsule Take 1 capsule (100 mg total) by mouth every 8 (eight) hours. 10/01/13  Yes Mellody Drown, PA-C  dextromethorphan-guaiFENesin [facility] DM) 30-600 MG per 12 hr tablet Take 1 tablet by mouth 2 (two) times daily. 10/01/13  Yes Mellody Drown, PA-C  diphenhydrAMINE (BENADRYL) 25 MG tablet Take 25 mg by mouth every 6 (six) hours as needed for itching or allergies. Allergic reaction   Yes Historical Provider, MD  EPINEPHrine  0.3 mg/0.3 mL IJ SOAJ injection Inject 0.3 mg into the muscle daily as needed. Allergic reaction   Yes Historical Provider, MD  ibuprofen (ADVIL,MOTRIN) 800 MG tablet Take 800 mg by mouth every 8 (eight) hours as needed for moderate pain.   Yes Historical Provider, MD  methocarbamol (ROBAXIN) 500 MG tablet Take 2 tablets (1,000 mg total) by mouth 4 (four) times daily as needed (Pain). 10/22/13   Dannie Hattabaugh, PA-C   BP 156/78  Pulse 80  Temp(Src) 98 F (36.7 C)  Resp 16  SpO2 98%  Physical Exam  Nursing note and vitals reviewed. Constitutional: He is oriented to person, place, and time. He appears well-developed and well-nourished. No distress.  HENT:  Head: Normocephalic.  Eyes: Conjunctivae and EOM are normal.  Cardiovascular: Normal rate, regular rhythm and intact distal pulses.   Pulmonary/Chest: Effort normal and breath sounds normal. No stridor.    Abdominal: Soft. Bowel sounds are normal. He exhibits no distension and no mass. There is no tenderness. There is no rebound and no guarding.  Musculoskeletal: Normal range of motion. He exhibits no edema and no tenderness.  No calf asymmetry, superficial collaterals, palpable cords, edema, Homans sign negative bilaterally.    Neurological: He is alert and oriented to person, place, and time.  Psychiatric: He has a normal mood and affect.    ED Course  Procedures (including critical care time)  DIAGNOSTIC STUDIES: Oxygen Saturation is 96% on  RA, normal by my interpretation.    COORDINATION OF CARE: 8:00 PM-Discussed treatment plan which includes a muscle relaxer, advil and tylenol with pt at bedside and pt agreed to plan.   Labs Review Labs Reviewed - No data to display  Imaging Review No results found.   EKG Interpretation None      MDM   Final diagnoses:  Left-sided thoracic back pain    Filed Vitals:   10/22/13 1714 10/22/13 2013  BP: 158/58 156/78  Pulse: 95 80  Temp: 98 F (36.7 C)   Resp: 18 16   SpO2: 96% 98%    Medications  ibuprofen (ADVIL,MOTRIN) tablet 800 mg (800 mg Oral Given 10/22/13 2013)  methocarbamol (ROBAXIN) tablet 1,000 mg (1,000 mg Oral Given 10/22/13 2012)    Raymond Mcclure is a 48 y.o. male presenting with right thoracic back pain. No overlying skin changes noted. Patient is low risk by Wells criteria and perked negative, he has no cough, no fever no shortness of breath. He is tender to palpation. I believe this is a chest wall pain. Patient will be given Robaxin and encouraged to use NSAIDs home.  Evaluation does not show pathology that would require ongoing emergent intervention or inpatient treatment. Pt is hemodynamically stable and mentating appropriately. Discussed findings and plan with patient/guardian, who agrees with care plan. All questions answered. Return precautions discussed and outpatient follow up given.   Discharge Medication List as of 10/22/2013  8:07 PM    START taking these medications   Details  methocarbamol (ROBAXIN) 500 MG tablet Take 2 tablets (1,000 mg total) by mouth 4 (four) times daily as needed (Pain)., Starting 10/22/2013, Until Discontinued, Print           I personally performed the services described in this documentation, which was scribed in my presence. The recorded information has been reviewed and is accurate.  Wynetta Emery, PA-C 10/22/13 [phone]

## 2013-10-22 NOTE — ED Notes (Signed)
Pt reports right upper back pain x 1 month. Pt states "I feel like I got a needle stuck in it or something." Pt in NAD. Ambulatory to triage. AO x4.

## 2013-10-22 NOTE — Discharge Instructions (Signed)
For pain control you may take up to  of Motrin (also known as ibuprofen). That is usually 4 over the counter pills,  3 times a day. Take with food to minimize stomach irritation   You can also take  tylenol (acetaminophen)  (this is 3 over the counter pills) four times a day. Do not drink alcohol or combine with other medications that have acetaminophen as an ingredient (Read the labels!).    For breakthrough pain you may take Robaxin. Do not drink alcohol, drive or operate heavy machinery when taking Robaxin. Do not hesitate to return to the emergency room for any new, worsening or concerning symptoms.  Please obtain primary care using resource guide below. But the minute you were seen in the emergency room and that they will need to obtain records for further outpatient management.    Back Pain, Adult Low back pain is very common. About 1 in 5 people have back pain.The cause of low back pain is rarely dangerous. The pain often gets better over time.About half of people with a sudden onset of back pain feel better in just 2 weeks. About 8 in 10 people feel better by 6 weeks.  CAUSES Some common causes of back pain include:  Strain of the muscles or ligaments supporting the spine.  Wear and tear (degeneration) of the spinal discs.  Arthritis.  Direct injury to the back. DIAGNOSIS Most of the time, the direct cause of low back pain is not known.However, back pain can be treated effectively even when the exact cause of the pain is unknown.Answering your caregiver's questions about your overall health and symptoms is one of the most accurate ways to make sure the cause of your pain is not dangerous. If your caregiver needs more information, he or she may order lab work or imaging tests (X-rays or MRIs).However, even if imaging tests show changes in your back, this usually does not require surgery. HOME CARE INSTRUCTIONS For many people, back pain returns.Since low back pain is  rarely dangerous, it is often a condition that people can learn to [facility] their own.   Remain active. It is stressful on the back to sit or stand in one place. Do not sit, drive, or stand in one place for more than 30 minutes at a time. Take short walks on level surfaces as soon as pain allows.Try to increase the length of time you walk each day.  Do not stay in bed.Resting more than 1 or 2 days can delay your recovery.  Do not avoid exercise or work.Your body is made to move.It is not dangerous to be active, even though your back may hurt.Your back will likely heal faster if you return to being active before your pain is gone.  Pay attention to your body when you bend and lift. Many people have less discomfortwhen lifting if they bend their knees, keep the load close to their bodies,and avoid twisting. Often, the most comfortable positions are those that put less stress on your recovering back.  Find a comfortable position to sleep. Use a firm mattress and lie on your side with your knees slightly bent. If you lie on your back, put a pillow under your knees.  Only take over-the-counter or prescription medicines as directed by your caregiver. Over-the-counter medicines to reduce pain and inflammation are often the most helpful.Your caregiver may prescribe muscle relaxant drugs.These medicines help dull your pain so you can more quickly return to your normal activities and healthy exercise.  Put ice on the injured area.  Put ice in a plastic bag.  Place a towel between your skin and the bag.  Leave the ice on for 15-20 minutes, 03-04 times a day for the first 2 to 3 days. After that, ice and heat may be alternated to reduce pain and spasms.  Ask your caregiver about trying back exercises and gentle massage. This may be of some benefit.  Avoid feeling anxious or stressed.Stress increases muscle tension and can worsen back pain.It is important to recognize when you are anxious or  stressed and learn ways to manage it.Exercise is a great option. SEEK MEDICAL CARE IF:  You have pain that is not relieved with rest or medicine.  You have pain that does not improve in 1 week.  You have new symptoms.  You are generally not feeling well. SEEK IMMEDIATE MEDICAL CARE IF:   You have pain that radiates from your back into your legs.  You develop new bowel or bladder control problems.  You have unusual weakness or numbness in your arms or legs.  You develop nausea or vomiting.  You develop abdominal pain.  You feel faint. Document Released: 01/18/2005 Document Revised: 07/20/2011 Document Reviewed: 05/22/2013 [facility] Patient Information 2015 [place], [place]. This information is not intended to replace advice given to you by your health care provider. Make sure you discuss any questions you have with your health care provider.     Emergency Department Resource Guide 1) Find a Doctor and Pay Out of Pocket Although you won't have to find out who is covered by your insurance plan, it is a good idea to ask around and get recommendations. You will then need to call the office and see if the doctor you have chosen will accept you as a new patient and what types of options they offer for patients who are self-pay. Some doctors offer discounts or will set up payment plans for their patients who do not have insurance, but you will need to ask so you aren't surprised when you get to your appointment.  2) Contact Your Local Health Department Not all health departments have doctors that can see patients for sick visits, but many do, so it is worth a call to see if yours does. If you don't know where your local health department is, you can check in your phone book. The CDC also has a tool to help you locate your state's health department, and many state websites also have listings of all of their local health departments.  3) Find a Walk-in Clinic If your illness is not likely  to be very severe or complicated, you may want to try a walk in clinic. These are popping up all over the country in pharmacies, drugstores, and shopping centers. They're usually staffed by nurse practitioners or physician assistants that have been trained to treat common illnesses and complaints. They're usually fairly quick and inexpensive. However, if you have serious medical issues or chronic medical problems, these are probably not your best option.  No Primary Care Doctor: - Call Health Connect at  [phone] - they can help you locate a primary care doctor that  accepts your insurance, provides certain services, etc. - Physician Referral Service- [phone]  Chronic Pain Problems: Organization         Address  Phone   Notes  [facility]  [phone] Patients need to be referred by their primary care doctor.   Medication Assistance: Organization  Address  Phone   Notes  [facility] Medication [facility] [address] [place] [place], [place] [address] [phone] --Must be a resident of [facility] -- Must have NO insurance coverage whatsoever (no Medicaid/ Medicare, etc.) -- The pt. MUST have a primary care doctor that directs their care regularly and follows them in the community   [facility]  [phone]   [employer]  [phone]    Agencies that provide inexpensive medical care: Organization         Address  Phone   Notes  [facility]  [phone]   [facility]    [phone]   [employer] [address] [place], [place] [address] [phone]   [facility] [address] [place] [place] [phone]   Planned Parenthood    [phone]   [facility]    [phone]   [facility]  [address], [place] Phone:  [phone], Fax:  [phone] Hours of Operation:  9 am - 6 pm, M-F.   Also accepts Medicaid/Medicare and self-pay.  [facility]  [address], [place] Phone: [phone], Fax: [phone]. Hours of Operation:  8:30 am - 5:30 pm, M-F.  Also accepts Medicaid and self-pay.  [facility] [place] [address] [place] Phone: [phone]   [facility] [address] Natasha Bence [place], [place] [phone], Ext. [phone] Mondays & Thursdays: 7-9 AM.  First 15 patients are seen on a first come, first serve basis.    Medicaid-accepting [facility] Providers:  Organization         Address  Phone   Notes  [facility] [address], [place] [phone] Also accepts self-pay patients.  [facility] [address] Laurell Josephs [place], [place]  [phone]   [facility] [address], [place] [phone]   [facility] [address], [place] [phone]   Renaye Rakers [address], [place]   [phone] Only accepts [place] Access [place] patients after they have their name applied to their card.   Self-Pay (no insurance) in [facility]:  Organization         Address  Phone   Notes  Sickle Cell Patients, [facility] Internal Medicine [address] [place], [place] [phone]   [facility] [address] [place], [place] [phone]   [facility] [place]  [address], [place] [phone]   [facility]/Dr. Osei-Bonsu  [address], [place] or [address], [place] [phone] Phone number for both [place] and [place] locations is the same.  [facility] and [facility] [address] [place] [phone]   [facility] [address] [place] or [address] [phone]   [facility] [address],  [place] [phone], phone; [phone], fax Sees patients 1st and 3rd Saturday of every month.  Must not qualify for public or private insurance (i.e. Medicaid, Medicare, [place] Health Choice, Veterans' Benefits)  Household income should be no more than 200% of the poverty level The clinic cannot treat you if you are pregnant or think you  are pregnant  Sexually transmitted diseases are not treated at the clinic.    Dental Care: Organization         Address  Phone  Notes  [facility] of [facility] [phone] Accepts children up to age 48 who are enrolled in [place] or [place]; pregnant women with a Medicaid card; and children who have applied for Medicaid or [place] Health Choice, but were declined, whose parents can pay a reduced fee at time of service.  [facility] Department of [facility]  [address], [place] [phone] Accepts children up to age 35 who are enrolled in [place] or [place]; pregnant women with a Medicaid card; and children who have applied for Medicaid or [place] Health Choice, but were declined, whose parents can pay a reduced fee at time of service.  [place] Adult [facility] Access PROGRAM  [place] [phone] Patients are seen by appointment only. Walk-ins are not accepted. [place] will see patients 51 years of age and older. Monday - Tuesday (8am-5pm) Most Wednesdays (8:30-5pm) $30 per visit, cash only  [facility] Adult Dental Access PROGRAM  [address], [facility] [phone] Patients are seen by appointment only. Walk-ins are not accepted. [place] will see patients 53 years of age and older. One Wednesday Evening (Monthly: Volunteer Based).  $30 per visit, cash only  [place]  [phone] for adults; Children under age 26, call Graduate Pediatric Dentistry at [phone].  Children aged 31-14, please call [phone] to request a pediatric application.  Dental services are provided in all areas of dental care including fillings, crowns and bridges, complete and partial dentures, implants, gum treatment, root canals, and extractions. Preventive care is also provided. Treatment is provided to both adults and children. Patients are selected via a lottery and there is often a waiting list.   [facility] [address], [place]  [phone] [email or web address]   [facility] [address] [place], [place] [phone], Ext. [phone] Second and Fourth Thursday of each month, opens at 6:30 AM; Clinic ends at 9 AM.  Patients are seen on a first-come first-served basis, and a limited number are seen during each clinic.   [facility]  [address] Hillard Danker [place], [place] [phone]   Eligibility Requirements You must have lived in [place], [place], or [place] counties for at least the last three months.   You cannot be eligible for state or federal sponsored [employer], including [employer], [place], or [employer].   You generally cannot be eligible for healthcare insurance through your employer.    How to apply: Eligibility screenings are held every Tuesday and Wednesday afternoon from 1:00 pm until 4:00 pm. You do not need an appointment for the interview!  [facility] [address] [place], [place]   [place]  [place] Department  [place]  [phone]    Behavioral Health Resources in the Community: Intensive Outpatient Programs Organization         Address  Phone  Notes  [place]. [address] [place], [place] [phone]   [facility] [address] [place], [place]   ADS: Alcohol [facility] Drug [facility] [address]  Dr, [place], [place]   [place] [address],  [place], [place] or [phone]   Substance Abuse Resources Organization         Address  Phone  Notes  Alcohol and Drug Services  [phone]   [place]  [phone]   [facility] [place]   Chinita Pester  [phone]   Residential & Outpatient Substance Abuse Program  [phone]   Psychological Services Organization         Address  Phone  Notes  [facility] [place]  [place]  [phone]   [place] [address] [place] or [phone]    Mobile Crisis Teams Organization         Address  Phone  Notes  [facility], Mobile Crisis Care Unit  [phone]   [facility]  [address] [place], [place]   [place] [address], [place] [phone]    Self-Help/Support Groups Organization         Address  Phone             Notes  [place]. of [place] - variety of support groups  [place] Call for more information  Narcotics Anonymous (NA), [facility] [address], [employer] [place]  2 meetings at this location   Special educational needs teacher         Address  Phone  Notes  [facility] [place],    [place]  1-6315931582   [facility]  [address], [place] [phone], [place], [place]   [place], [place] [phone] Admissions: 8am-3pm M-F  Incentives Substance [place] [address]    [place], [place] [phone]   [facility] [address] [place], [place], [place]   [facility] [address],  [place], [place]   [facility] - Intensive Outpatient [address] Kristeen [address], [place], [place]     [facility] ([place].,  [place], [place] [phone] or [phone]   [facility] ([facility]) [address], [place], [place] Accepts Medicaid  [facility] [place] [address],  [place] [phone] Substance Abuse/Addiction Treatment   [facility] Organization         Address  Phone  Notes  [facility]  [phone]   Domenic Schwab, PhD [address] Arlis [place], [place]   [phone] or [phone]   [place], [place] [phone]   [facility] [address], [place], [place] [phone] Insurance/Medicaid/sponsorship through [facility] and Families [address]                                    [place], [place] [phone] [place] [address], [place] [phone]    Dr. Adele Schilder  [phone]   [facility] Dept. 1) [address], [place] 2) [place] 3)  [address], [place] [phone]  [phone]   [place] (  [phone] or [phone] (After Hours)

## 2013-10-25 NOTE — ED Provider Notes (Signed)
Medical screening examination/treatment/procedure(s) were performed by non-physician practitioner and as supervising physician I was immediately available for consultation/collaboration.   EKG Interpretation None        Tanyon Alipio, MD 10/25/13 1522 

## 2013-11-04 ENCOUNTER — Encounter (HOSPITAL_COMMUNITY): Payer: Self-pay | Admitting: Emergency Medicine

## 2013-11-04 ENCOUNTER — Emergency Department (HOSPITAL_COMMUNITY)
Admission: EM | Admit: 2013-11-04 | Discharge: 2013-11-04 | Disposition: A | Discharge status: Home / Self Care | Payer: 59 | Attending: Emergency Medicine | Admitting: Emergency Medicine

## 2013-11-04 DIAGNOSIS — Z8669 Personal history of other diseases of the nervous system and sense organs: Secondary | ICD-10-CM | POA: Insufficient documentation

## 2013-11-04 DIAGNOSIS — Z720 Tobacco use: Secondary | ICD-10-CM | POA: Insufficient documentation

## 2013-11-04 DIAGNOSIS — M5441 Lumbago with sciatica, right side: Secondary | ICD-10-CM | POA: Insufficient documentation

## 2013-11-04 DIAGNOSIS — Z79899 Other long term (current) drug therapy: Secondary | ICD-10-CM | POA: Insufficient documentation

## 2013-11-04 MED ORDER — METHOCARBAMOL 500 MG PO TABS: 1000.0000 mg | ORAL_TABLET | Freq: Four times a day (QID) | ORAL | Status: DC

## 2013-11-04 MED ORDER — NAPROXEN 500 MG PO TABS: 500.0000 mg | ORAL_TABLET | Freq: Two times a day (BID) | ORAL | Status: DC

## 2013-11-04 MED ORDER — HYDROCODONE-ACETAMINOPHEN 5-325 MG PO TABS: ORAL_TABLET | ORAL | Status: DC

## 2013-11-04 NOTE — Discharge Instructions (Signed)
Please read and follow all provided instructions.  Your diagnoses today include:  1. Right-sided low back pain with right-sided sciatica    Tests performed today include:  Vital signs - see below for your results today  Medications prescribed:   Vicodin (hydrocodone/acetaminophen) - narcotic pain medication  DO NOT drive or perform any activities that require you to be awake and alert because this medicine can make you drowsy. BE VERY CAREFUL not to take multiple medicines containing Tylenol (also called acetaminophen). Doing so can lead to an overdose which can damage your liver and cause liver failure and possibly death.   Robaxin (methocarbamol) - muscle relaxer medication  DO NOT drive or perform any activities that require you to be awake and alert because this medicine can make you drowsy.    Naproxen - anti-inflammatory pain medication  Do not exceed 500mg  naproxen every 12 hours, take with food  You have been prescribed an anti-inflammatory medication or NSAID. Take with food. Take smallest effective dose for the shortest duration needed for your pain. Stop taking if you experience stomach pain or vomiting.   Take any prescribed medications only as directed.  Home care instructions:   Follow any educational materials contained in this packet  Please rest, use ice or heat on your back for the next several days  Do not lift, push, pull anything more than 10 pounds for the next week  Follow-up instructions: Please follow-up with your primary care provider in the next 1 week for further evaluation of your symptoms.   Return instructions:  SEEK IMMEDIATE MEDICAL ATTENTION IF YOU HAVE:  New numbness, tingling, weakness, or problem with the use of your arms or legs  Severe back pain not relieved with medications  Loss control of your bowels or bladder  Increasing pain in any areas of the body (such as chest or abdominal pain)  Shortness of breath, dizziness, or  fainting.   Worsening nausea (feeling sick to your stomach), vomiting, fever, or sweats  Any other emergent concerns regarding your health   Additional Information:  Your vital signs today were: BP 121/75   Pulse 79   Temp(Src) 98.4 F (36.9 C) (Oral)   Resp 20   SpO2 100% If your blood pressure (BP) was elevated above 135/85 this visit, please have this repeated by your doctor within one month. --------------

## 2013-11-04 NOTE — ED Notes (Signed)
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 Reports today he woke up with right sided lower back that radiates down his right leg. Reports right leg feels numb. Pain 8/10. Pt ambulatory.

## 2013-11-04 NOTE — ED Provider Notes (Signed)
Medical screening examination/treatment/procedure(s) were performed by non-physician practitioner and as supervising physician I was immediately available for consultation/collaboration.   EKG Interpretation None        Audree CamelScott T Mahin Guardia, MD 11/04/13 1525

## 2013-11-04 NOTE — ED Provider Notes (Signed)
CSN: [id number]     Arrival date & time 11/04/13  [id number] History   First MD Initiated Contact with Patient 11/04/13 1005     Chief Complaint  Patient presents with  . Leg Pain  . Back Pain     (Consider location/radiation/quality/duration/timing/severity/associated sxs/prior Treatment) HPI Comments: Patient denies hitting her past medical history presents with complaint of right sided lower back pain that began early this morning. Patient states that he has been working at [employer] for past several days, doing heavy lifting. He complains of R low back pain radiating into R leg. No treatments prior to arrival. Patient denies warning symptoms of back pain including: fecal incontinence, urinary retention or overflow incontinence, night sweats, waking from sleep with back pain, unexplained fevers or weight loss, h/o cancer, IVDU, recent trauma. The onset of this condition was acute. The course is constant. Aggravating factors: movement. Alleviating factors: none.    Patient is a 48 y.o. male presenting with leg pain and back pain. The history is provided by the patient.  Leg Pain Associated symptoms: back pain   Associated symptoms: no fever   Back Pain Associated symptoms: leg pain   Associated symptoms: no fever, no numbness and no weakness     Past Medical History  Diagnosis Date  . Cluster headaches    Past Surgical History  Procedure Laterality Date  . Wrist surgery      nerve repair   History reviewed. No pertinent family history. History  Substance Use Topics  . Smoking status: Current Some Day Smoker -- 0.15 packs/day    Types: Cigarettes  . Smokeless tobacco: Not on file  . Alcohol Use: No    Review of Systems  Constitutional: Negative for fever and unexpected weight change.  Gastrointestinal: Negative for constipation.       Neg for fecal incontinence  Genitourinary: Negative for hematuria, flank pain and difficulty urinating.       Negative for urinary  incontinence or retention  Musculoskeletal: Positive for back pain.  Neurological: Negative for weakness and numbness.       Negative for saddle paresthesias     Allergies  Bee venom and Tramadol  Home Medications   Prior to Admission medications   Medication Sig Start Date End Date Taking? Authorizing Provider  benzonatate (TESSALON) 100 MG capsule Take 1 capsule (100 mg total) by mouth every 8 (eight) hours. 10/01/13   Mellody DrownLauren Parker, PA-C  dextromethorphan-guaiFENesin [facility](MUCINEX DM) 30-600 MG per 12 hr tablet Take 1 tablet by mouth 2 (two) times daily. 10/01/13   Mellody DrownLauren Parker, PA-C  diphenhydrAMINE (BENADRYL) 25 MG tablet Take 25 mg by mouth every 6 (six) hours as needed for itching or allergies. Allergic reaction    Historical Provider, MD  EPINEPHrine 0.3 mg/0.3 mL IJ SOAJ injection Inject 0.3 mg into the muscle daily as needed. Allergic reaction    Historical Provider, MD  ibuprofen (ADVIL,MOTRIN) 800 MG tablet Take 800 mg by mouth every 8 (eight) hours as needed for moderate pain.    Historical Provider, MD  methocarbamol (ROBAXIN) 500 MG tablet Take 2 tablets (1,000 mg total) by mouth 4 (four) times daily as needed (Pain). 10/22/13   Nicole Pisciotta, PA-C   BP 121/75  Pulse 79  Temp(Src) 98.4 F (36.9 C) (Oral)  Resp 20  SpO2 100%  Physical Exam  Nursing note and vitals reviewed. Constitutional: He appears well-developed and well-nourished.  HENT:  Head: Normocephalic and atraumatic.  Eyes: Conjunctivae are normal.  Neck: Normal range  of motion.  Abdominal: Soft. There is no tenderness. There is no CVA tenderness.  Musculoskeletal:       Cervical back: He exhibits normal range of motion, no tenderness and no bony tenderness.       Thoracic back: He exhibits normal range of motion, no tenderness and no bony tenderness.       Lumbar back: He exhibits decreased range of motion and tenderness. He exhibits no bony tenderness.       Back:  No step-off noted with palpation of  spine.   Neurological: He is alert. He has normal reflexes. No sensory deficit. He exhibits normal muscle tone.  5/5 strength in entire lower extremities bilaterally. No sensation deficit.   Skin: Skin is warm and dry.  Psychiatric: He has a normal mood and affect.    ED Course  Procedures (including critical care time) Labs Review Labs Reviewed - No data to display  Imaging Review No results found.   EKG Interpretation None      10:57 AM Patient seen and examined. Work-up initiated. Medications ordered.   Vital signs reviewed and are as follows: Filed Vitals:   11/04/13 0948  BP: 121/75  Pulse: 79  Temp: 98.4 F (36.9 C)  Resp: 20    No red flag s/s of low back pain. Patient was counseled on back pain precautions and told to do activity as tolerated but do not lift, push, or pull heavy objects more than 10 pounds for the next week.  Patient counseled to use ice or heat on back for no longer than 15 minutes every hour.   Patient prescribed muscle relaxer and counseled on proper use of muscle relaxant medication.    Patient prescribed narcotic pain medicine and counseled on proper use of narcotic pain medications. Counseled not to combine this medication with others containing tylenol.   Urged patient not to drink alcohol, drive, or perform any other activities that requires focus while taking either of these medications.  Patient urged to follow-up with PCP if pain does not improve with treatment and rest or if pain becomes recurrent. Urged to return with worsening severe pain, loss of bowel or bladder control, trouble walking.   The patient verbalizes understanding and agrees with the plan.   MDM   Final diagnoses:  Right-sided low back pain with right-sided sciatica   Patient with back pain with radicular features. No neurological deficits. Patient is ambulatory. No warning symptoms of back pain including: fecal incontinence, urinary retention or overflow  incontinence, night sweats, waking from sleep with back pain, unexplained fevers or weight loss, h/o cancer, IVDU, recent trauma. No concern for cauda equina, epidural abscess, or other serious cause of back pain. Conservative measures such as rest, ice/heat and pain medicine indicated with PCP follow-up if no improvement with conservative management.      Renne Crigler, PA-C 11/04/13 1137

## 2013-11-20 ENCOUNTER — Other Ambulatory Visit (HOSPITAL_COMMUNITY)
Admission: RE | Admit: 2013-11-20 | Discharge: 2013-11-20 | Disposition: A | Discharge status: Home / Self Care | Payer: Self-pay | Source: Ambulatory Visit | Attending: Family Medicine | Admitting: Family Medicine

## 2013-11-20 ENCOUNTER — Encounter (HOSPITAL_COMMUNITY): Payer: Self-pay | Admitting: Emergency Medicine

## 2013-11-20 ENCOUNTER — Emergency Department (INDEPENDENT_AMBULATORY_CARE_PROVIDER_SITE_OTHER)
Admission: EM | Admit: 2013-11-20 | Discharge: 2013-11-20 | Disposition: A | Discharge status: Home / Self Care | Payer: Self-pay | Source: Home / Self Care | Attending: Family Medicine | Admitting: Family Medicine

## 2013-11-20 DIAGNOSIS — N342 Other urethritis: Secondary | ICD-10-CM

## 2013-11-20 DIAGNOSIS — Z113 Encounter for screening for infections with a predominantly sexual mode of transmission: Secondary | ICD-10-CM | POA: Insufficient documentation

## 2013-11-20 DIAGNOSIS — B356 Tinea cruris: Secondary | ICD-10-CM

## 2013-11-20 LAB — RPR

## 2013-11-20 LAB — POCT URINALYSIS DIP (DEVICE)
Bilirubin Urine: NEGATIVE
Glucose, UA: NEGATIVE mg/dL
Hgb urine dipstick: NEGATIVE
Ketones, ur: NEGATIVE mg/dL
Leukocytes, UA: NEGATIVE
Nitrite: NEGATIVE
Protein, ur: NEGATIVE mg/dL
UROBILINOGEN UA: 4 mg/dL — AB (ref 0.0–1.0)
pH: 6.5 (ref 5.0–8.0)

## 2013-11-20 LAB — HIV ANTIBODY (ROUTINE TESTING W REFLEX): HIV: NONREACTIVE

## 2013-11-20 MED ORDER — AZITHROMYCIN 250 MG PO TABS
ORAL_TABLET | ORAL | Status: AC
  Filled 2013-11-20: qty 4

## 2013-11-20 MED ORDER — AZITHROMYCIN 250 MG PO TABS
1000.0000 mg | ORAL_TABLET | Freq: Once | ORAL | Status: AC
Start: 2013-11-20 — End: 2013-11-20
  Administered 2013-11-20: 1000 mg via ORAL

## 2013-11-20 MED ORDER — NYSTATIN 100000 UNIT/GM EX POWD: CUTANEOUS | Status: DC

## 2013-11-20 MED ORDER — CEFTRIAXONE SODIUM 250 MG IJ SOLR
250.0000 mg | Freq: Once | INTRAMUSCULAR | Status: AC
Start: 2013-11-20 — End: 2013-11-20
  Administered 2013-11-20: 250 mg via INTRAMUSCULAR

## 2013-11-20 MED ORDER — LIDOCAINE HCL (PF) 1 % IJ SOLN
INTRAMUSCULAR | Status: AC
  Filled 2013-11-20: qty 5

## 2013-11-20 MED ORDER — CEFTRIAXONE SODIUM 250 MG IJ SOLR
INTRAMUSCULAR | Status: AC
  Filled 2013-11-20: qty 250

## 2013-11-20 MED ORDER — DOXYCYCLINE HYCLATE 100 MG PO CAPS: 100.0000 mg | ORAL_CAPSULE | Freq: Two times a day (BID) | ORAL | Status: DC

## 2013-11-20 NOTE — ED Notes (Signed)
Patient aware of post injection delay prior to discharge.   

## 2013-11-20 NOTE — ED Provider Notes (Signed)
CSN: [id number]     Arrival date & time 11/20/13  0903 History   First MD Initiated Contact with Patient 11/20/13 0932     Chief Complaint  Patient presents with  . Dysuria   (Consider location/radiation/quality/duration/timing/severity/associated sxs/prior Treatment) Patient is a 48 y.o. male presenting with dysuria. The history is provided by the patient.  Dysuria This is a new problem. The current episode started 2 days ago. The problem occurs constantly. The problem has not changed since onset.Associated symptoms comments: +penile discharge.    Past Medical History  Diagnosis Date  . Cluster headaches    Past Surgical History  Procedure Laterality Date  . Wrist surgery      nerve repair   No family history on file. History  Substance Use Topics  . Smoking status: Current Some Day Smoker -- 0.15 packs/day    Types: Cigarettes  . Smokeless tobacco: Not on file  . Alcohol Use: No    Review of Systems  Genitourinary: Positive for dysuria.  All other systems reviewed and are negative.   Allergies  Bee venom and Tramadol  Home Medications   Prior to Admission medications   Medication Sig Start Date End Date Taking? Authorizing Provider  benzonatate (TESSALON) 100 MG capsule Take 1 capsule (100 mg total) by mouth every 8 (eight) hours. 10/01/13   Mellody DrownLauren Parker, PA-C  dextromethorphan-guaiFENesin [facility](MUCINEX DM) 30-600 MG per 12 hr tablet Take 1 tablet by mouth 2 (two) times daily. 10/01/13   Mellody DrownLauren Parker, PA-C  diphenhydrAMINE (BENADRYL) 25 MG tablet Take 25 mg by mouth every 6 (six) hours as needed for itching or allergies. Allergic reaction    Historical Provider, MD  doxycycline (VIBRAMYCIN) 100 MG capsule Take 1 capsule (100 mg total) by mouth 2 (two) times daily. X 7 days 11/20/13   Mathis FareJennifer Lee H Presson, PA  EPINEPHrine 0.3 mg/0.3 mL IJ SOAJ injection Inject 0.3 mg into the muscle daily as needed. Allergic reaction    Historical Provider, MD  HYDROcodone-acetaminophen  (NORCO/VICODIN) 5-325 MG per tablet Take 1-2 tablets every 6 hours as needed for severe pain 11/04/13   Renne CriglerJoshua Geiple, PA-C  ibuprofen (ADVIL,MOTRIN) 800 MG tablet Take 800 mg by mouth every 8 (eight) hours as needed for moderate pain.    Historical Provider, MD  methocarbamol (ROBAXIN) 500 MG tablet Take 2 tablets (1,000 mg total) by mouth 4 (four) times daily as needed (Pain). 10/22/13   Nicole Pisciotta, PA-C  methocarbamol (ROBAXIN) 500 MG tablet Take 2 tablets (1,000 mg total) by mouth 4 (four) times daily. 11/04/13   Renne CriglerJoshua Geiple, PA-C  naproxen (NAPROSYN) 500 MG tablet Take 1 tablet (500 mg total) by mouth 2 (two) times daily. 11/04/13   Renne CriglerJoshua Geiple, PA-C   BP 120/81  Pulse 77  Temp(Src) 99.7 F (37.6 C) (Oral)  Resp 16  SpO2 98% Physical Exam  Nursing note and vitals reviewed. Constitutional: He is oriented to person, place, and time. He appears well-developed and well-nourished. No distress.  HENT:  Head: Normocephalic and atraumatic.  Eyes: Conjunctivae are normal. No scleral icterus.  Cardiovascular: Normal rate, regular rhythm and normal heart sounds.   Pulmonary/Chest: Effort normal and breath sounds normal.  Abdominal: Hernia confirmed negative in the right inguinal area and confirmed negative in the left inguinal area.  Genitourinary: Testes normal and penis normal.    Circumcised.  Outlined areas with rash consistent with mild tinea cruris  Musculoskeletal: Normal range of motion.  Lymphadenopathy:       Right: No  inguinal adenopathy present.       Left: No inguinal adenopathy present.  Neurological: He is alert and oriented to person, place, and time.  Skin: Skin is warm and dry. No rash noted. No erythema.  Psychiatric: He has a normal mood and affect. His behavior is normal.    ED Course  Procedures (including critical care time) Labs Review Labs Reviewed  POCT URINALYSIS DIP (DEVICE) - Abnormal; Notable for the following:    Urobilinogen, UA 4.0 (*)    All  other components within normal limits  RPR  HIV ANTIBODY (ROUTINE TESTING)  URINE CYTOLOGY ANCILLARY ONLY    Imaging Review No results found.   MDM   1. Urethritis   urine sent for cytology along with HIV and RPR testing [facility] and [facility] 1g po and ceftriaxone 250mg  IM For home, doxycycline 100mg  bid x 7 days and nystatin powder for tinea cruris    Ria ClockJennifer Lee H Presson, PA 11/20/13 1051

## 2013-11-20 NOTE — ED Notes (Signed)
Reports painful urination and noticed discharge for 2 days, reports back pain

## 2013-11-20 NOTE — Discharge Instructions (Signed)
Urethritis °Urethritis is an inflammation of the tube through which urine exits your bladder (urethra).  °CAUSES °Urethritis is often caused by an infection in your urethra. The infection can be viral, like herpes. The infection can also be bacterial, like gonorrhea. °RISK FACTORS °Risk factors of urethritis include: °· Having sex without using a condom. °· Having multiple sexual partners. °· Having poor hygiene. °SIGNS AND SYMPTOMS °Symptoms of urethritis are less noticeable in women than in men. These symptoms include: °· Burning feeling when you urinate (dysuria). °· Discharge from your urethra. °· Blood in your urine (hematuria). °· Urinating more than usual. °DIAGNOSIS  °To confirm a diagnosis of urethritis, your health care provider will do the following: °· Ask about your sexual history. °· Perform a physical exam. °· Have you provide a sample of your urine for lab testing. °· Use a cotton swab to gently collect a sample from your urethra for lab testing. °TREATMENT  °It is important to treat urethritis. Depending on the cause, untreated urethritis may lead to serious genital infections and possibly infertility. Urethritis caused by a bacterial infection is treated with antibiotic medicine. All sexual partners must be treated.  °HOME CARE INSTRUCTIONS °· Do not have sex until the test results are known and treatment is completed, even if your symptoms go away before you finish treatment. °· If you were prescribed an antibiotic, finish it all even if you start to feel better. °SEEK MEDICAL CARE IF:  °· Your symptoms are not improved in 3 days. °· Your symptoms are getting worse. °· You develop abdominal pain or pelvic pain (in women). °· You develop joint pain. °· You have a fever. °SEEK IMMEDIATE MEDICAL CARE IF:  °· You have severe pain in the belly, back, or side. °· You have repeated vomiting. °MAKE SURE YOU: °· Understand these instructions. °· Will watch your condition. °· Will get help right away if you  are not doing well or get worse. °Document Released: 07/14/2000 Document Revised: 06/04/2013 Document Reviewed: 09/18/2012 °[employer]® Patient Information ©2015 [employer]. This information is not intended to replace advice given to you by your health care provider. Make sure you discuss any questions you have with your health care provider. ° °

## 2013-11-20 NOTE — ED Notes (Signed)
Explained urine collection process,sent to bathroom

## 2013-11-21 LAB — URINE CYTOLOGY ANCILLARY ONLY
CHLAMYDIA, DNA PROBE: NEGATIVE
Neisseria Gonorrhea: NEGATIVE
Trichomonas: NEGATIVE

## 2013-11-23 NOTE — ED Provider Notes (Signed)
Medical screening examination/treatment/procedure(s) were performed by resident physician or non-physician practitioner and as supervising physician I was immediately available for consultation/collaboration.   KINDL,JAMES DOUGLAS MD.   James D Kindl, MD 11/23/13 1003 

## 2013-12-25 ENCOUNTER — Emergency Department (HOSPITAL_COMMUNITY)
Admission: EM | Admit: 2013-12-25 | Discharge: 2013-12-25 | Disposition: A | Discharge status: Home / Self Care | Payer: Self-pay | Attending: Emergency Medicine | Admitting: Emergency Medicine

## 2013-12-25 ENCOUNTER — Encounter (HOSPITAL_COMMUNITY): Payer: Self-pay | Admitting: Emergency Medicine

## 2013-12-25 DIAGNOSIS — Z720 Tobacco use: Secondary | ICD-10-CM | POA: Insufficient documentation

## 2013-12-25 DIAGNOSIS — Z791 Long term (current) use of non-steroidal anti-inflammatories (NSAID): Secondary | ICD-10-CM | POA: Insufficient documentation

## 2013-12-25 DIAGNOSIS — R369 Urethral discharge, unspecified: Secondary | ICD-10-CM | POA: Insufficient documentation

## 2013-12-25 DIAGNOSIS — Z8669 Personal history of other diseases of the nervous system and sense organs: Secondary | ICD-10-CM | POA: Insufficient documentation

## 2013-12-25 DIAGNOSIS — R300 Dysuria: Secondary | ICD-10-CM | POA: Insufficient documentation

## 2013-12-25 DIAGNOSIS — Z79899 Other long term (current) drug therapy: Secondary | ICD-10-CM | POA: Insufficient documentation

## 2013-12-25 DIAGNOSIS — R61 Generalized hyperhidrosis: Secondary | ICD-10-CM | POA: Insufficient documentation

## 2013-12-25 DIAGNOSIS — Z792 Long term (current) use of antibiotics: Secondary | ICD-10-CM | POA: Insufficient documentation

## 2013-12-25 LAB — URINALYSIS, ROUTINE W REFLEX MICROSCOPIC
Bilirubin Urine: NEGATIVE
Glucose, UA: NEGATIVE mg/dL
Hgb urine dipstick: NEGATIVE
Ketones, ur: NEGATIVE mg/dL
Leukocytes, UA: NEGATIVE
Nitrite: NEGATIVE
Protein, ur: NEGATIVE mg/dL
Specific Gravity, Urine: 1.023 (ref 1.005–1.030)
Urobilinogen, UA: 1 mg/dL (ref 0.0–1.0)
pH: 5.5 (ref 5.0–8.0)

## 2013-12-25 NOTE — Discharge Instructions (Signed)
Refer to attached documents for more information. Follow up with the health department as needed. You will be contacted if your results are positive.

## 2013-12-25 NOTE — ED Notes (Signed)
PT states yellow discharge and painful to urinate; same symptoms years ago and was treated gonorrhea.

## 2013-12-25 NOTE — ED Provider Notes (Signed)
CSN: [id number]     Arrival date & time 12/25/13  [id number] History  This chart was scribed for non-physician practitioner Emilia BeckKaitlyn Sylvester Minton, PA-C, working with Linwood DibblesJon Knapp, MD by Littie Deedsichard Sun, ED Scribe. This patient was seen in room TR09C/TR09C and the patient's care was started at 9:29 AM.      Chief Complaint  Patient presents with  . SEXUALLY TRANSMITTED DISEASE    Patient is a 48 y.o. male presenting with dysuria. The history is provided by the patient. No language interpreter was used.  Dysuria The current episode started more than 2 days ago. The problem occurs daily. The problem has not changed since onset.Pertinent negatives include no abdominal pain. Nothing aggravates the symptoms. Nothing relieves the symptoms. He has tried nothing for the symptoms.   HPI Comments: Raymond Mcclure is a 48 y.o. male who presents to the Emergency Department complaining of gradual onset, burning dysuria that started about 3-4 days ago; he is concerned about a possible STD. Patient also reports one instance of clear discharge this morning before urination as well as diaphoresis at night. He states it feels similar to when he was seen at urgent care last month. Patient denies abdominal pain and fever.   Past Medical History  Diagnosis Date  . Cluster headaches    Past Surgical History  Procedure Laterality Date  . Wrist surgery      nerve repair   History reviewed. No pertinent family history. History  Substance Use Topics  . Smoking status: Current Some Day Smoker -- 0.15 packs/day    Types: Cigarettes  . Smokeless tobacco: Not on file  . Alcohol Use: No    Review of Systems  Constitutional: Positive for diaphoresis. Negative for fever.  Gastrointestinal: Negative for abdominal pain.  Genitourinary: Positive for dysuria and discharge.  All other systems reviewed and are negative.     Allergies  Bee venom and Tramadol  Home Medications   Prior to Admission medications   Medication Sig  Start Date End Date Taking? Authorizing Provider  EPINEPHrine 0.3 mg/0.3 mL IJ SOAJ injection Inject 0.3 mg into the muscle daily as needed. Allergic reaction   Yes Historical Provider, MD  benzonatate (TESSALON) 100 MG capsule Take 1 capsule (100 mg total) by mouth every 8 (eight) hours. Patient not taking: Reported on 12/25/2013 10/01/13   Mellody DrownLauren Parker, PA-C  dextromethorphan-guaiFENesin [facility](MUCINEX DM) 30-600 MG per 12 hr tablet Take 1 tablet by mouth 2 (two) times daily. Patient not taking: Reported on 12/25/2013 10/01/13   Mellody DrownLauren Parker, PA-C  doxycycline (VIBRAMYCIN) 100 MG capsule Take 1 capsule (100 mg total) by mouth 2 (two) times daily. X 7 days Patient not taking: Reported on 12/25/2013 11/20/13   Ria ClockJennifer Lee H Presson, PA  HYDROcodone-acetaminophen (NORCO/VICODIN) 5-325 MG per tablet Take 1-2 tablets every 6 hours as needed for severe pain Patient not taking: Reported on 12/25/2013 11/04/13   Renne CriglerJoshua Geiple, PA-C  methocarbamol (ROBAXIN) 500 MG tablet Take 2 tablets (1,000 mg total) by mouth 4 (four) times daily as needed (Pain). Patient not taking: Reported on 12/25/2013 10/22/13   Joni ReiningNicole Pisciotta, PA-C  methocarbamol (ROBAXIN) 500 MG tablet Take 2 tablets (1,000 mg total) by mouth 4 (four) times daily. Patient not taking: Reported on 12/25/2013 11/04/13   Renne CriglerJoshua Geiple, PA-C  naproxen (NAPROSYN) 500 MG tablet Take 1 tablet (500 mg total) by mouth 2 (two) times daily. Patient not taking: Reported on 12/25/2013 11/04/13   Renne CriglerJoshua Geiple, PA-C  nystatin (MYCOSTATIN/NYSTOP) 100000 UNIT/GM POWD Apply  to affected areas BID x 14 days Patient not taking: Reported on 12/25/2013 11/20/13   Jess BartersJennifer Lee H Presson, PA   BP 124/83 mmHg  Pulse 86  Temp(Src) 97.6 F (36.4 C)  Resp 20  Ht 5\' 10"  (1.778 m)  Wt 245 lb (111.131 kg)  BMI 35.15 kg/m2  SpO2 100% Physical Exam  Constitutional: He is oriented to person, place, and time. He appears well-developed and well-nourished. No distress.  HENT:   Head: Normocephalic and atraumatic.  Mouth/Throat: Oropharynx is clear and moist. No oropharyngeal exudate.  Eyes: Pupils are equal, round, and reactive to light.  Neck: Neck supple.  Cardiovascular: Normal rate.   Pulmonary/Chest: Effort normal.  Musculoskeletal: He exhibits no edema.  Neurological: He is alert and oriented to person, place, and time. No cranial nerve deficit.  Skin: Skin is warm and dry. No rash noted.  Psychiatric: He has a normal mood and affect. His behavior is normal.  Nursing note and vitals reviewed.   ED Course  Procedures  DIAGNOSTIC STUDIES: Oxygen Saturation is 100% on room air, normal by my interpretation.    COORDINATION OF CARE: 9:32 AM-Discussed treatment plan which includes labs with pt at bedside and pt agreed to plan.    Labs Review Labs Reviewed - No data to display  Imaging Review No results found.   EKG Interpretation None      MDM   Final diagnoses:  Dysuria   9:41 AM Urinalysis and GC/chlamydia pending. Vitals stable and patient afebrile.   10:19 AM Urinalysis unremarkable for acute changes. Patient will be contacted if GC/chlamydia results are positive.   I personally performed the services described in this documentation, which was scribed in my presence. The recorded information has been reviewed and is accurate.    Emilia BeckKaitlyn Aaro Meyers, PA-C 12/25/13 1019  Linwood DibblesJon Knapp, MD 12/26/13 [id number]

## 2013-12-26 LAB — GC/CHLAMYDIA PROBE AMP
CT PROBE, AMP APTIMA: NEGATIVE
GC Probe RNA: NEGATIVE

## 2014-01-06 ENCOUNTER — Encounter (HOSPITAL_COMMUNITY): Payer: Self-pay | Admitting: Emergency Medicine

## 2014-01-06 ENCOUNTER — Emergency Department (HOSPITAL_COMMUNITY)
Admission: EM | Admit: 2014-01-06 | Discharge: 2014-01-06 | Disposition: A | Discharge status: Home / Self Care | Payer: Self-pay | Attending: Emergency Medicine | Admitting: Emergency Medicine

## 2014-01-06 DIAGNOSIS — Z711 Person with feared health complaint in whom no diagnosis is made: Secondary | ICD-10-CM

## 2014-01-06 DIAGNOSIS — Z202 Contact with and (suspected) exposure to infections with a predominantly sexual mode of transmission: Secondary | ICD-10-CM | POA: Insufficient documentation

## 2014-01-06 DIAGNOSIS — R21 Rash and other nonspecific skin eruption: Secondary | ICD-10-CM

## 2014-01-06 DIAGNOSIS — Z8669 Personal history of other diseases of the nervous system and sense organs: Secondary | ICD-10-CM | POA: Insufficient documentation

## 2014-01-06 DIAGNOSIS — Z720 Tobacco use: Secondary | ICD-10-CM | POA: Insufficient documentation

## 2014-01-06 DIAGNOSIS — Z872 Personal history of diseases of the skin and subcutaneous tissue: Secondary | ICD-10-CM

## 2014-01-06 LAB — HIV ANTIBODY (ROUTINE TESTING W REFLEX): HIV 1&2 Ab, 4th Generation: NONREACTIVE

## 2014-01-06 LAB — RPR

## 2014-01-06 MED ORDER — CEFTRIAXONE SODIUM 250 MG IJ SOLR
250.0000 mg | Freq: Once | INTRAMUSCULAR | Status: AC
  Administered 2014-01-06: 250 mg via INTRAMUSCULAR
  Filled 2014-01-06: qty 250

## 2014-01-06 MED ORDER — TRIAMCINOLONE ACETONIDE 0.1 % EX CREA
1.0000 | TOPICAL_CREAM | Freq: Two times a day (BID) | CUTANEOUS | Status: DC
Start: 2014-01-06 — End: 2014-08-23

## 2014-01-06 MED ORDER — AZITHROMYCIN 250 MG PO TABS
1000.0000 mg | ORAL_TABLET | Freq: Once | ORAL | Status: AC
  Administered 2014-01-06: 1000 mg via ORAL
  Filled 2014-01-06: qty 4

## 2014-01-06 NOTE — ED Notes (Signed)
Declined W/C at D/C and was escorted to lobby by RN. 

## 2014-01-06 NOTE — ED Provider Notes (Signed)
CSN: [id number]     Arrival date & time 01/06/14  [id number] History  This chart was scribed for Raymond FinnerErin O'Malley, PA-C working with Flint MelterElliott L Wentz, MD by Evon Slackerrance Branch, ED Scribe. This patient was seen in room TR05C/TR05C and the patient's care was started at 9:05 AM.      Chief Complaint  Patient presents with  . Exposure to STD   Patient is a 48 y.o. male presenting with STD exposure. The history is provided by the patient. No language interpreter was used.  Exposure to STD  HPI Comments: Raymond BattyJoseph D Mcclure is a 48 y.o. male who presents to the Emergency Department complaining of penile discharge onset 1 month prior. Pt states he has associated dysuria. Pt states that his wife called and told him she had a positive STD test for gonorrhea. Denies unprotected intercourse with anyone other than his wife. He states that he has been having a slight headache as well. Pt states that he has also has noticed he has had a generalized rash due to his Hx of eczema  Denies nausea, vomiting or fever. No medication taken PTA.    Past Medical History  Diagnosis Date  . Cluster headaches    Past Surgical History  Procedure Laterality Date  . Wrist surgery      nerve repair   No family history on file. History  Substance Use Topics  . Smoking status: Current Some Day Smoker -- 0.15 packs/day    Types: Cigarettes  . Smokeless tobacco: Not on file  . Alcohol Use: No    Review of Systems  Constitutional: Negative for fever.  Gastrointestinal: Negative for nausea and vomiting.  Genitourinary: Positive for dysuria and discharge.  Skin: Positive for rash.  All other systems reviewed and are negative.   Allergies  Bee venom and Tramadol  Home Medications   Prior to Admission medications   Medication Sig Start Date End Date Taking? Authorizing Provider  benzonatate (TESSALON) 100 MG capsule Take 1 capsule (100 mg total) by mouth every 8 (eight) hours. Patient not taking: Reported on 12/25/2013 10/01/13    Mellody DrownLauren Parker, PA-C  dextromethorphan-guaiFENesin [facility](MUCINEX DM) 30-600 MG per 12 hr tablet Take 1 tablet by mouth 2 (two) times daily. Patient not taking: Reported on 12/25/2013 10/01/13   Mellody DrownLauren Parker, PA-C  doxycycline (VIBRAMYCIN) 100 MG capsule Take 1 capsule (100 mg total) by mouth 2 (two) times daily. X 7 days Patient not taking: Reported on 12/25/2013 11/20/13   Mathis FareJennifer Lee H Presson, PA  EPINEPHrine 0.3 mg/0.3 mL IJ SOAJ injection Inject 0.3 mg into the muscle daily as needed. Allergic reaction    Historical Provider, MD  HYDROcodone-acetaminophen (NORCO/VICODIN) 5-325 MG per tablet Take 1-2 tablets every 6 hours as needed for severe pain Patient not taking: Reported on 12/25/2013 11/04/13   Renne CriglerJoshua Geiple, PA-C  methocarbamol (ROBAXIN) 500 MG tablet Take 2 tablets (1,000 mg total) by mouth 4 (four) times daily as needed (Pain). Patient not taking: Reported on 12/25/2013 10/22/13   Joni ReiningNicole Pisciotta, PA-C  methocarbamol (ROBAXIN) 500 MG tablet Take 2 tablets (1,000 mg total) by mouth 4 (four) times daily. Patient not taking: Reported on 12/25/2013 11/04/13   Renne CriglerJoshua Geiple, PA-C  naproxen (NAPROSYN) 500 MG tablet Take 1 tablet (500 mg total) by mouth 2 (two) times daily. Patient not taking: Reported on 12/25/2013 11/04/13   Renne CriglerJoshua Geiple, PA-C  nystatin (MYCOSTATIN/NYSTOP) 100000 UNIT/GM POWD Apply to affected areas BID x 14 days Patient not taking: Reported on 12/25/2013 11/20/13  Raymond ClockJennifer Lee H Presson, PA  triamcinolone cream (KENALOG) 0.1 % Apply 1 application topically 2 (two) times daily. 01/06/14   Raymond FinnerErin O'Malley, PA-C   Triage Vitals: BP 123/85 mmHg  Pulse 70  Temp(Src) 97.5 F (36.4 C) (Oral)  Resp 16  Ht 5\' 10"  (1.778 m)  Wt 245 lb (111.131 kg)  BMI 35.15 kg/m2  SpO2 96%  Physical Exam  Constitutional: He is oriented to person, place, and time. He appears well-developed and well-nourished.  HENT:  Head: Normocephalic and atraumatic.  Eyes: EOM are normal.  Neck: Normal  range of motion.  Cardiovascular: Normal rate.   Pulmonary/Chest: Effort normal.  Genitourinary: Circumcised.  Chaperoned exam circumcised penis , no erythema, no lesions, no discharge, no scrotal swelling erythema or tenderness  Musculoskeletal: Normal range of motion.  Neurological: He is alert and oriented to person, place, and time.  Skin: Skin is warm and dry. Rash noted.  Dry excoriated rash on bilateral forearms, no palm rash, no rash on mucosa.   Psychiatric: He has a normal mood and affect. His behavior is normal.  Nursing note and vitals reviewed.   ED Course  Procedures (including critical care time) DIAGNOSTIC STUDIES: Oxygen Saturation is 96% on RA, adequate  by my interpretation.    COORDINATION OF CARE: 9:18 AM-Discussed treatment plan with pt at bedside and pt agreed to plan.     Labs Review Labs Reviewed  GC/CHLAMYDIA PROBE AMP  HIV ANTIBODY (ROUTINE TESTING)  RPR    Imaging Review No results found.   EKG Interpretation None      MDM   Final diagnoses:  Concern about STD in male without diagnosis  Rash  History of eczema   Pt is a 48yo male presenting to ED with reports of penile discharge and dysuria x1 month. Exposure to gonorrhea per wife.  GU exam: normal, no penile discharge, rash or lesions. Pt also c/o rash c/w eczema, no rash on oral mucosa or palms.  No evidence of underlying infection of rash. Blood work and GC/chlamydia swab performed in ED.  Empirical tx with azithromycin and rocephin given in ED.  Home care instructions provided. [facility] also provided for recheck of rash. Return precautions provided. Pt verbalized understanding and agreement with tx plan.   I personally performed the services described in this documentation, which was scribed in my presence. The recorded information has been reviewed and is accurate.      Raymond Raymond O'Malley, PA-C 01/06/14 [id number]  Flint MelterElliott L Wentz, MD 01/08/14 [phone]

## 2014-01-06 NOTE — ED Notes (Signed)
Pt. Stated, His [place] called from [place] and said he needed to get checked for gonorrhea.

## 2014-01-06 NOTE — Discharge Instructions (Signed)
°  Refrain from sexual intercourse for 7 days. Be sure to have all partners tested and treated for STDs.  Practice safe sex by always wearing condoms.  ° °

## 2014-01-07 LAB — GC/CHLAMYDIA PROBE AMP
CT Probe RNA: NEGATIVE
GC Probe RNA: NEGATIVE

## 2014-02-08 ENCOUNTER — Encounter (HOSPITAL_COMMUNITY): Payer: Self-pay

## 2014-02-08 ENCOUNTER — Emergency Department (HOSPITAL_COMMUNITY)
Admission: EM | Admit: 2014-02-08 | Discharge: 2014-02-08 | Disposition: A | Discharge status: Home / Self Care | Payer: Self-pay | Attending: Emergency Medicine | Admitting: Emergency Medicine

## 2014-02-08 ENCOUNTER — Emergency Department (HOSPITAL_COMMUNITY): Payer: Self-pay

## 2014-02-08 DIAGNOSIS — R109 Unspecified abdominal pain: Secondary | ICD-10-CM | POA: Insufficient documentation

## 2014-02-08 DIAGNOSIS — Z720 Tobacco use: Secondary | ICD-10-CM | POA: Insufficient documentation

## 2014-02-08 DIAGNOSIS — G44009 Cluster headache syndrome, unspecified, not intractable: Secondary | ICD-10-CM | POA: Insufficient documentation

## 2014-02-08 DIAGNOSIS — M546 Pain in thoracic spine: Secondary | ICD-10-CM | POA: Insufficient documentation

## 2014-02-08 LAB — CBC WITH DIFFERENTIAL/PLATELET
BASOS PCT: 1 % (ref 0–1)
Basophils Absolute: 0 10*3/uL (ref 0.0–0.1)
EOS ABS: 0.3 10*3/uL (ref 0.0–0.7)
Eosinophils Relative: 3 % (ref 0–5)
HEMATOCRIT: 40.3 % (ref 39.0–52.0)
Hemoglobin: 13.5 g/dL (ref 13.0–17.0)
LYMPHS ABS: 2.9 10*3/uL (ref 0.7–4.0)
Lymphocytes Relative: 36 % (ref 12–46)
MCH: 31.6 pg (ref 26.0–34.0)
MCHC: 33.5 g/dL (ref 30.0–36.0)
MCV: 94.4 fL (ref 78.0–100.0)
MONOS PCT: 8 % (ref 3–12)
Monocytes Absolute: 0.6 10*3/uL (ref 0.1–1.0)
Neutro Abs: 4.1 10*3/uL (ref 1.7–7.7)
Neutrophils Relative %: 52 % (ref 43–77)
Platelets: 296 10*3/uL (ref 150–400)
RBC: 4.27 MIL/uL (ref 4.22–5.81)
RDW: 12.6 % (ref 11.5–15.5)
WBC: 7.9 10*3/uL (ref 4.0–10.5)

## 2014-02-08 LAB — COMPREHENSIVE METABOLIC PANEL
ALBUMIN: 4.1 g/dL (ref 3.5–5.2)
ALT: 25 U/L (ref 0–53)
ANION GAP: 7 (ref 5–15)
AST: 25 U/L (ref 0–37)
Alkaline Phosphatase: 86 U/L (ref 39–117)
BILIRUBIN TOTAL: 0.6 mg/dL (ref 0.3–1.2)
BUN: 13 mg/dL (ref 6–23)
CO2: 22 mmol/L (ref 19–32)
Calcium: 9 mg/dL (ref 8.4–10.5)
Chloride: 111 mEq/L (ref 96–112)
Creatinine, Ser: 0.88 mg/dL (ref 0.50–1.35)
GFR calc Af Amer: >90 mL/min (ref >90)
GFR calc non Af Amer: >90 mL/min (ref >90)
GLUCOSE: 87 mg/dL (ref 70–99)
Potassium: 4 mmol/L (ref 3.5–5.1)
SODIUM: 140 mmol/L (ref 135–145)
Total Protein: 7 g/dL (ref 6.0–8.3)

## 2014-02-08 LAB — URINALYSIS, ROUTINE W REFLEX MICROSCOPIC
Bilirubin Urine: NEGATIVE
GLUCOSE, UA: NEGATIVE mg/dL
Hgb urine dipstick: NEGATIVE
KETONES UR: NEGATIVE mg/dL
Leukocytes, UA: NEGATIVE
Nitrite: NEGATIVE
PROTEIN: NEGATIVE mg/dL
Specific Gravity, Urine: 1.026 (ref 1.005–1.030)
Urobilinogen, UA: 1 mg/dL (ref 0.0–1.0)
pH: 5.5 (ref 5.0–8.0)

## 2014-02-08 LAB — LIPASE, BLOOD: Lipase: 28 U/L (ref 11–59)

## 2014-02-08 MED ORDER — HYDROCODONE-ACETAMINOPHEN 5-325 MG PO TABS
2.0000 | ORAL_TABLET | Freq: Once | ORAL | Status: AC
  Administered 2014-02-08: 2 via ORAL
  Filled 2014-02-08: qty 2

## 2014-02-08 MED ORDER — HYDROCODONE-ACETAMINOPHEN 5-325 MG PO TABS: 2.0000 | ORAL_TABLET | ORAL | Status: DC | PRN

## 2014-02-08 NOTE — ED Notes (Signed)
Pt playing on the computer and talking with his granddaughter. NAD. Watching TV.

## 2014-02-08 NOTE — ED Provider Notes (Signed)
CSN: [id number]     Arrival date & time 02/08/14  1157 History   First MD Initiated Contact with Patient 02/08/14 1226     Chief Complaint  Patient presents with  . Flank Pain     (Consider location/radiation/quality/duration/timing/severity/associated sxs/prior Treatment) HPI  Raymond Mcclure is a 49 y.o. male with PMH of presenting left-sided mid back pain that started today. It is worse with movement and ambulation. Pain has been constant and at its worse he states he has transient shorts of breath. He denies any fevers or chills no history of kidney stones no blood in his urine. He states he did go to the gym yesterday but more lower body workout as well as cardio. Pain is described as stabbing ache it does not radiate. He has not taken anything for it and he denies any aggravating or alleviating factors. No fevers, chills, night sweats, weight loss, IVDU, history of malignancy. No loss of control of bladder or bowel. No numbness/tingling, weakness or saddle anesthesia. Patient denies any penile discharge, penile swelling or lesions noted testicular lesions or swelling.    Past Medical History  Diagnosis Date  . Cluster headaches    Past Surgical History  Procedure Laterality Date  . Wrist surgery      nerve repair   History reviewed. No pertinent family history. History  Substance Use Topics  . Smoking status: Current Some Day Smoker -- 0.15 packs/day    Types: Cigarettes  . Smokeless tobacco: Not on file  . Alcohol Use: No    Review of Systems 10 Systems reviewed and are negative for acute change except as noted in the HPI.    Allergies  Bee venom  Home Medications   Prior to Admission medications   Medication Sig Start Date End Date Taking? Authorizing Provider  EPINEPHrine 0.3 mg/0.3 mL IJ SOAJ injection Inject 0.3 mg into the muscle daily as needed. Allergic reaction   Yes Historical Provider, MD  benzonatate (TESSALON) 100 MG capsule Take 1 capsule (100 mg total)  by mouth every 8 (eight) hours. Patient not taking: Reported on 12/25/2013 10/01/13   Mellody DrownLauren Parker, PA-C  dextromethorphan-guaiFENesin [facility](MUCINEX DM) 30-600 MG per 12 hr tablet Take 1 tablet by mouth 2 (two) times daily. Patient not taking: Reported on 12/25/2013 10/01/13   Mellody DrownLauren Parker, PA-C  doxycycline (VIBRAMYCIN) 100 MG capsule Take 1 capsule (100 mg total) by mouth 2 (two) times daily. X 7 days Patient not taking: Reported on 12/25/2013 11/20/13   Ria ClockJennifer Lee H Presson, PA  HYDROcodone-acetaminophen (NORCO/VICODIN) 5-325 MG per tablet Take 2 tablets by mouth every 4 (four) hours as needed for moderate pain or severe pain. 02/08/14   Louann SjogrenVictoria L Geral Coker, PA-C  methocarbamol (ROBAXIN) 500 MG tablet Take 2 tablets (1,000 mg total) by mouth 4 (four) times daily as needed (Pain). Patient not taking: Reported on 12/25/2013 10/22/13   Joni ReiningNicole Pisciotta, PA-C  methocarbamol (ROBAXIN) 500 MG tablet Take 2 tablets (1,000 mg total) by mouth 4 (four) times daily. Patient not taking: Reported on 12/25/2013 11/04/13   Renne CriglerJoshua Geiple, PA-C  naproxen (NAPROSYN) 500 MG tablet Take 1 tablet (500 mg total) by mouth 2 (two) times daily. Patient not taking: Reported on 12/25/2013 11/04/13   Renne CriglerJoshua Geiple, PA-C  nystatin (MYCOSTATIN/NYSTOP) 100000 UNIT/GM POWD Apply to affected areas BID x 14 days Patient not taking: Reported on 12/25/2013 11/20/13   Mathis FareJennifer Lee H Presson, PA  triamcinolone cream (KENALOG) 0.1 % Apply 1 application topically 2 (two) times daily. Patient  not taking: Reported on 02/08/2014 01/06/14   Junius Finner, PA-C   BP 112/73 mmHg  Pulse 70  Temp(Src) 97.5 F (36.4 C) (Oral)  Resp 16  SpO2 98% Physical Exam  Constitutional: He appears well-developed and well-nourished. No distress.  HENT:  Head: Normocephalic and atraumatic.  Eyes: Conjunctivae are normal. Right eye exhibits no discharge. Left eye exhibits no discharge.  Cardiovascular: Normal rate, regular rhythm and normal heart sounds.    Pulmonary/Chest: Effort normal and breath sounds normal. No respiratory distress. He has no wheezes.  Abdominal: Soft. Bowel sounds are normal. He exhibits no distension. There is no tenderness.  Musculoskeletal:  No midline back tenderness, step off or crepitus. Left sided mid back tenderness. No CVA tenderness.   Neurological: He is alert. Coordination normal.  Equal muscle tone. 5/5 strength in lower extremities. DTR equal and intact. Negative straight leg test. Normal.  Skin: Skin is warm and dry. He is not diaphoretic.  Nursing note and vitals reviewed.   ED Course  Procedures (including critical care time) Labs Review Labs Reviewed  URINALYSIS, ROUTINE W REFLEX MICROSCOPIC  CBC WITH DIFFERENTIAL  COMPREHENSIVE METABOLIC PANEL  LIPASE, BLOOD    Imaging Review Dg Chest 2 View  02/08/2014   CLINICAL DATA:  Shortness breath.  Upper back pain.  EXAM: CHEST  2 VIEW  COMPARISON:  Two-view chest x-ray 10/01/2013  FINDINGS: The heart size is normal. The lung volumes are low. No focal airspace disease is present. The visualized soft tissues and bony thorax are unremarkable.  IMPRESSION: 1. Low lung volumes. 2. No acute cardiopulmonary disease.   Electronically Signed   By: Gennette Pac M.D.   On: 02/08/2014 14:11     EKG Interpretation None      MDM   Final diagnoses:  Flank pain   Patient with left-sided flank pain that is worse with movement and does not radiate into his groin. Pain not typical for kidney stones.  Patient's laboratory results normal. UA without any evidence of infection or blood. Chest x-ray obtained due to transient shortness of breath that has resolved. It is without abnormalities other than low lung volumes. Patient with back pain likely musculoskeletal in etiology. No loss of bowel or bladder control. No saddle anesthesia. No fever, night sweats, weight loss, h/o cancer, IVDU. VSS. No neurological deficits and normal neuro exam. Pt ambulatory. No concern for  cauda equina.  RICE protocol and pain medicine indicated and discussed with patient. Driving and sedation precautions provided. Patient is afebrile, nontoxic, and in no acute distress. Patient is appropriate for outpatient management and is stable for discharge.  Discussed return precautions with patient. Discussed all results and patient verbalizes understanding and agrees with plan.  Case has been discussed with Dr. Littie Deeds who agrees with the above plan and to discharge.    Louann Sjogren, PA-C 02/08/14 1559  Mirian Mo, MD 02/09/14 [phone]

## 2014-02-08 NOTE — ED Notes (Signed)
Per pt, left flank pain starting today.  No change in urination.  No blood in urine.  No hx of kidney stones.  Denies trauma or physical exertion.  No fever.

## 2014-02-08 NOTE — Discharge Instructions (Signed)
Return to the emergency room with worsening of symptoms, new symptoms or with symptoms that are concerning , especially fevers, loss of control of bladder or bowels, numbness or tingling around genital region or anus, weakness. RICE: Rest, Ice (three cycles of 20 mins on, off at least twice a day), compression/brace, elevation. Heating pad works well for back pain. Ibuprofen  (2 tablets ) every 5-6 hours for 3-5 days and then as needed for pain. Norco for severe pain. Do not operate machinery, drive or drink alcohol while taking narcotics or muscle relaxers. Follow up with primary care provider if symptoms worsen or are persistent.   Emergency Department Resource Guide 1) Find a Doctor and Pay Out of Pocket Although you won't have to find out who is covered by your insurance plan, it is a good idea to ask around and get recommendations. You will then need to call the office and see if the doctor you have chosen will accept you as a new patient and what types of options they offer for patients who are self-pay. Some doctors offer discounts or will set up payment plans for their patients who do not have insurance, but you will need to ask so you aren't surprised when you get to your appointment.  2) Contact Your Local Health Department Not all health departments have doctors that can see patients for sick visits, but many do, so it is worth a call to see if yours does. If you don't know where your local health department is, you can check in your phone book. The CDC also has a tool to help you locate your state's health department, and many state websites also have listings of all of their local health departments.  3) Find a Walk-in Clinic If your illness is not likely to be very severe or complicated, you may want to try a walk in clinic. These are popping up all over the country in pharmacies, drugstores, and shopping centers. They're usually staffed by nurse practitioners or  physician assistants that have been trained to treat common illnesses and complaints. They're usually fairly quick and inexpensive. However, if you have serious medical issues or chronic medical problems, these are probably not your best option.  No Primary Care Doctor: - Call Health Connect at  [phone] - they can help you locate a primary care doctor that  accepts your insurance, provides certain services, etc. - Physician Referral Service- [phone]  Chronic Pain Problems: Organization         Address  Phone   Notes  [facility]  [phone] Patients need to be referred by their primary care doctor.   Medication Assistance: Organization         Address  Phone   Notes  [facility] Medication [facility] [address] [place] [place], [place] [address] [phone] --Must be a resident of [facility] -- Must have NO insurance coverage whatsoever (no Medicaid/ Medicare, etc.) -- The pt. MUST have a primary care doctor that directs their care regularly and follows them in the community   [facility]ist  [phone]   [employer]  [phone]    Agencies that provide inexpensive medical care: Organization         Address  Phone   Notes  [facility]  [phone]   [facility]    [phone]   [facility] [address] [place], [place] [address] [phone]  [facility] [address] [place], [place] [phone]   [facility]anned Parenthood    [phone]   [facility]    [phone]   Community Health and [facility]  [address], [place] Phone:  [phone], Fax:  [phone] Hours of Operation:  9 am - 6 pm, M-F.  Also accepts Medicaid/Medicare and self-pay.  [facility]  [address], [place] Phone: [phone], Fax: [phone]. Hours of Operation:  8:30 am - 5:30 pm, M-F.   Also accepts Medicaid and self-pay.  [facility] [place] [address] [place] Phone: [phone]   [facility] [address] Natasha Bence [place], [place] [phone], Ext. [phone] Mondays & Thursdays: 7-9 AM.  First 15 patients are seen on a first come, first serve basis.    Medicaid-accepting [facility] Providers:  Organization         Address  Phone   Notes  [facility] [address], [place] [phone] Also accepts self-pay patients.  [facility] [address] Laurell Josephs [place], [place]  [phone]   [facility] [address], [place] [phone]   [facility] [address], [place] [phone]   Renaye Rakers [address], [place]   [phone] Only accepts [place] patients after they have their name applied to their card.   Self-Pay (no insurance) in [facility]:  Organization         Address  Phone   Notes  Sickle Cell Patients, [facility] [address] [place], [place] [phone]   [facility] Urgent Care [address] [place], [place] [phone]   [facility]  [address] [place] [address], [place] [phone]   [facility]/Dr. Osei-Bonsu  [address] [place] or [address], [place] [phone] Phone number for both [place] and [place] locations is the same.  [facility]gent Medical and [facility] [address] [place] [phone]   [facility] [address], [place] or [address] [phone]   [facility] [address], [place] [phone], phone; [phone], fax Sees patients 1st and 3rd Saturday of every month.  Must not qualify for public or private insurance (i.e. Medicaid, Medicare, [place] Health Choice, Veterans'  Benefits)  Household income should be no more than 200% of the poverty level The clinic cannot treat you if you are pregnant or think you are pregnant  Sexually transmitted diseases are not treated at the clinic.    Dental Care: Organization         Address  Phone  Notes  [facility] Department of [facility] [address] [place], [place] [phone] Accepts children up to age 79 who are enrolled in [place] or [place] Health Choice; pregnant women with a Medicaid card; and children who have applied for Medicaid or [place] Health Choice, but were declined, whose parents can pay a reduced fee at time of service.  [facility] Department of [facility]  [address], [place] [phone] Accepts children up to age 76 who are enrolled in [place] or  Health Choice; pregnant women with a Medicaid card; and  children who have applied for Medicaid or [place] Health Choice, but were declined, whose parents can pay a reduced fee at time of service.  [facility]  [address] [place], [place] [phone] Patients are seen by appointment only. Walk-ins are not accepted. [facility] will see patients 54 years of age and older. Monday - Tuesday (8am-5pm) Most Wednesdays (8:30-5pm) $30 per visit, cash only  [facility] Adult Dental Access PRO[facility]  [address], [facility] [phone] Patients are seen by appointment only. Walk-ins are not accepted. [facility] will see patients 57 years of age and older. One Wednesday Evening (Monthly: Volunteer Based).  $30 per visit, cash only  [employer]  [phone] for adults; Children under age 68, call Graduate Pediatric Dentistry at [phone]. Children aged 50-14, please call [phone] to request a pediatric application.  Dental services are provided in all areas of dental care including fillings, crowns and bridges, complete and partial  dentures, implants, gum treatment, root canals, and extractions. Preventive care is also provided. Treatment is provided to both adults and children. Patients are selected via a lottery and there is often a waiting list.   [facility] [address] [place]  [phone] [email or web address]   [facility] [address] [place], [place] [phone], Ext. [phone] Second and Fourth Thursday of each month, opens at 6:30 AM; Clinic ends at 9 AM.  Patients are seen on a first-come first-served basis, and a limited number are seen during each clinic.   [facility]  [address] Ether Griffins [place], [place] [phone]   Eligibility Requirements You must have lived in [place], [place], or [place] counties for at least the last three months.   You cannot be eligible for state or federal sponsored [place], including [employer], [place], or [employer].   You generally cannot be eligible for healthcare insurance through your employer.    How to apply: Eligibility screenings are held every Tuesday and Wednesday afternoon from 1:00 pm until 4:00 pm. You do not need an appointment for the interview!  [facility] [address], [place], [place] [phone]   [facility] Department  [phone]   [facility] Health Department  [phone]   [facility] Health Department  [phone]    Behavioral Health Resources in the Community: Intensive Outpatient Programs Organization         Address  Phone  Notes  [facility] [address] [place], [place] [phone]   [facility] Outpatient [address] [place], [place] [phone]   [facility] [address] [place], [place]  [phone]   [facility] [address]  [place], [place] [phone] or [phone]   Substance Abuse Resources Organization          Address  Phone  Notes  [facility]  [phone]   [facility]  [phone]   [facility]  [phone]   Floydene Flock  [phone]   Residential & Outpatient Substance Abuse Program  [phone]   Psychological Services Organization         Address  Phone  Notes  [facility] Behavioral Health  [phone]   [facility]  [phone]   [facility] Mental Health [address], [place] [phone] or [phone]    Mobile Crisis Teams Organization  Address  Phone  Notes  [facility], Mobile Crisis Care Unit  [phone]   [facility]  [address] [place], [place] [phone]   [facility]sch [address] [place] [phone]    Self-Help/Support Groups Organization         Address  Phone             Notes  [facility] [place] - variety of support groups  [phone] Call for more information  Narcotics Anonymous (NA), [facility] [address], [place]  2 meetings at this location   Statisticianesidential Treatment Programs Organization         Address  Phone  Notes  [facility] [address],    [place]  [phone]   [facility]  [address], [place] [phone], [place], [place] [phone]   [facility] [address], [place] [phone] Admissions: 8am-3pm M-F  [facility] [address]    [place], [place] [phone]   [facility] [address], [place], [place] [phone]   [facility] [address]  [place], [place] [phone]   [facility] - Intensive Outpatient [address], [place], [place] [phone]   [facility] ([facility] [address]  [place], [place] [phone] or [phone]   [facility] ([facility]) [address] [place], [place]  [phone] Accepts Medicaid  [facility] [address]  [place] [phone] Substance Abuse/Addiction Treatment   [facility] Behavioral Health Resources Organization         Address  Phone  Notes  [facility]  [phone]) [phone]   Angie FavaJulie Brannon, PhD [address] [place], [place]   [phone]) [phone] or [phone]) [phone]   [facility] [place]   [address] [place], [place] [phone](336) [phone]   [facility] [address], [place], [place] [phone](336) [phone] Insurance/Medicaid/sponsorship through [facility]  [facility] [address]                                    [place], [place] [phone]) [phone] Therapy/tele-psych/case  [facility] [address]   [place], [place] [phone]) [phone]    Dr. Lolly MustacheArfeen  [phone](336) [phone]   [facility]  [employer] [facility] 1) [address], [place] 2) [address], [place] 3)  [address], [place] [phone]) [phone]) [phone]  [phone]) [phone]   [facility] Child Abuse Hotline [phone](336) [phone] or [phone](336) [phone] (After Hours)       Flank Pain Flank pain refers to pain that is located on the side of the body between the upper abdomen and the back. The pain may occur over a short period of time (acute) or may be long-term or reoccurring (chronic). It may be mild or severe. Flank pain can be caused by many things. CAUSES  Some of the more common causes of flank pain include:  Muscle strains.   Muscle spasms.   A disease of your spine (vertebral disk disease).   A lung infection (pneumonia).   Fluid around your lungs (pulmonary edema).   A kidney infection.   Kidney stones.   A very painful skin rash caused by the chickenpox virus (shingles).   Gallbladder disease.  HOME  CARE INSTRUCTIONS  Home care will depend on the cause of your pain. In general,  Rest as directed by your caregiver.  Drink enough fluids to keep your urine clear or  pale yellow.  Only take over-the-counter or prescription medicines as directed by your caregiver. Some medicines may help relieve the pain.  Tell your caregiver about any changes in your pain.  Follow up with your caregiver as directed. SEEK IMMEDIATE MEDICAL CARE IF:   Your pain is not controlled with medicine.   You have new or worsening symptoms.  Your pain increases.   You have abdominal pain.   You have shortness of breath.   You have persistent nausea or vomiting.   You have swelling in your abdomen.   You feel faint or pass out.   You have blood in your urine.  You have a fever or persistent symptoms for more than 2-3 days.  You have a fever and your symptoms suddenly get worse. MAKE SURE YOU:   Understand these instructions.  Will watch your condition.  Will get help right away if you are not doing well or get worse. Document Released: 03/11/2005 Document Revised: 10/13/2011 Document Reviewed: 09/02/2011 [facility] Patient Information 2015 [place], [place]. This information is not intended to replace advice given to you by your health care provider. Make sure you discuss any questions you have with your health care provider.

## 2014-05-13 ENCOUNTER — Emergency Department (HOSPITAL_COMMUNITY)
Admission: EM | Admit: 2014-05-13 | Discharge: 2014-05-13 | Disposition: A | Discharge status: Home / Self Care | Payer: 59 | Attending: Emergency Medicine | Admitting: Emergency Medicine

## 2014-05-13 ENCOUNTER — Encounter (HOSPITAL_COMMUNITY): Payer: Self-pay

## 2014-05-13 DIAGNOSIS — Z8669 Personal history of other diseases of the nervous system and sense organs: Secondary | ICD-10-CM | POA: Insufficient documentation

## 2014-05-13 DIAGNOSIS — M79601 Pain in right arm: Secondary | ICD-10-CM

## 2014-05-13 DIAGNOSIS — Z791 Long term (current) use of non-steroidal anti-inflammatories (NSAID): Secondary | ICD-10-CM | POA: Insufficient documentation

## 2014-05-13 DIAGNOSIS — Z79899 Other long term (current) drug therapy: Secondary | ICD-10-CM | POA: Insufficient documentation

## 2014-05-13 DIAGNOSIS — M25521 Pain in right elbow: Secondary | ICD-10-CM | POA: Insufficient documentation

## 2014-05-13 DIAGNOSIS — Z720 Tobacco use: Secondary | ICD-10-CM | POA: Insufficient documentation

## 2014-05-13 DIAGNOSIS — Z792 Long term (current) use of antibiotics: Secondary | ICD-10-CM | POA: Insufficient documentation

## 2014-05-13 DIAGNOSIS — Z7952 Long term (current) use of systemic steroids: Secondary | ICD-10-CM | POA: Insufficient documentation

## 2014-05-13 MED ORDER — HYDROCODONE-ACETAMINOPHEN 5-325 MG PO TABS: 1.0000 | ORAL_TABLET | ORAL | Status: DC | PRN

## 2014-05-13 MED ORDER — NAPROXEN 500 MG PO TABS: 500.0000 mg | ORAL_TABLET | Freq: Two times a day (BID) | ORAL | Status: DC

## 2014-05-13 NOTE — Discharge Instructions (Signed)
Take the prescribed medication as directed.  Try to reduce stress on right arm, use left arm when possible.  Rest, ice, and elevate arm when not working. Follow-up with orthopedics if no improvement in 1 week or if symptoms worsen.

## 2014-05-13 NOTE — ED Provider Notes (Signed)
CSN: [id number]     Arrival date & time 05/13/14  1234 History  This chart was scribed for Sharilyn Sites, PA-C, working with Marisa Severin, MD by Jolene Provost, ED Scribe. This patient was seen in room WTR9/WTR9 and the patient's care was started at 1:35 PM.    Chief Complaint  Patient presents with  . Arm Pain   Patient is a 49 y.o. male presenting with arm pain. The history is provided by the patient. No language interpreter was used.  Arm Pain    HPI Comments: Raymond Mcclure is a 49 y.o. male who presents to the Emergency Department complaining of constant pain in his right elbow, worse during the day with use, that radiates to his right pinky finger for the last two weeks. Pt endorses associated numbness in his pinky. Pt has NKDA.  He denies known injury, trauma, or falls.  Patient works for [employer] and does repetitive motions on a daily basis.  No intervention tried PTA.  VSS.  Past Medical History  Diagnosis Date  . Cluster headaches    Past Surgical History  Procedure Laterality Date  . Wrist surgery      nerve repair   History reviewed. No pertinent family history. History  Substance Use Topics  . Smoking status: Current Some Day Smoker -- 0.15 packs/day    Types: Cigarettes  . Smokeless tobacco: Not on file  . Alcohol Use: No    Review of Systems  Constitutional: Negative for fever and chills.  Musculoskeletal: Positive for arthralgias. Negative for joint swelling.  Skin: Negative for color change and wound.  All other systems reviewed and are negative.   Allergies  Bee venom  Home Medications   Prior to Admission medications   Medication Sig Start Date End Date Taking? Authorizing Provider  benzonatate (TESSALON) 100 MG capsule Take 1 capsule (100 mg total) by mouth every 8 (eight) hours. Patient not taking: Reported on 12/25/2013 10/01/13   Mellody Drown, PA-C  dextromethorphan-guaiFENesin [facility] DM) 30-600 MG per 12 hr tablet Take 1 tablet  by mouth 2 (two) times daily. Patient not taking: Reported on 12/25/2013 10/01/13   Mellody Drown, PA-C  doxycycline (VIBRAMYCIN) 100 MG capsule Take 1 capsule (100 mg total) by mouth 2 (two) times daily. X 7 days Patient not taking: Reported on 12/25/2013 11/20/13   Mathis Fare Presson, PA  EPINEPHrine 0.3 mg/0.3 mL IJ SOAJ injection Inject 0.3 mg into the muscle daily as needed. Allergic reaction    Historical Provider, MD  HYDROcodone-acetaminophen (NORCO/VICODIN) 5-325 MG per tablet Take 2 tablets by mouth every 4 (four) hours as needed for moderate pain or severe pain. 02/08/14   Oswaldo Conroy, PA-C  methocarbamol (ROBAXIN) 500 MG tablet Take 2 tablets (1,000 mg total) by mouth 4 (four) times daily as needed (Pain). Patient not taking: Reported on 12/25/2013 10/22/13   Joni Reining Pisciotta, PA-C  methocarbamol (ROBAXIN) 500 MG tablet Take 2 tablets (1,000 mg total) by mouth 4 (four) times daily. Patient not taking: Reported on 12/25/2013 11/04/13   Renne Crigler, PA-C  naproxen (NAPROSYN) 500 MG tablet Take 1 tablet (500 mg total) by mouth 2 (two) times daily. Patient not taking: Reported on 12/25/2013 11/04/13   Renne Crigler, PA-C  nystatin (MYCOSTATIN/NYSTOP) 100000 UNIT/GM POWD Apply to affected areas BID x 14 days Patient not taking: Reported on 12/25/2013 11/20/13   Mathis Fare Presson, PA  triamcinolone cream (KENALOG) 0.1 % Apply 1 application topically 2 (two) times daily. Patient not taking:  Reported on 02/08/2014 01/06/14   Junius FinnerErin O'Malley, PA-C   BP 127/87 mmHg  Pulse 106  Temp(Src) 97.9 F (36.6 C) (Oral)  Resp 18  SpO2 97%   Physical Exam  Constitutional: He is oriented to person, place, and time. He appears well-developed and well-nourished. No distress.  HENT:  Head: Normocephalic and atraumatic.  Eyes: Pupils are equal, round, and reactive to light.  Neck: Neck supple.  Cardiovascular: Normal rate.   Pulmonary/Chest: Effort normal. No respiratory distress.   Musculoskeletal: Normal range of motion.       Right elbow: He exhibits normal range of motion, no swelling, no effusion and no deformity. Tenderness found. Lateral epicondyle tenderness noted.       Arms: Tenderness of right lateral elbow without swelling or bony deformity; full ROM maintained; endorses paresthesias of right 5th digit but hand remains NVI  Neurological: He is alert and oriented to person, place, and time. Coordination normal.  Skin: Skin is warm and dry. He is not diaphoretic.  Psychiatric: He has a normal mood and affect. His behavior is normal.  Nursing note and vitals reviewed.   ED Course  Procedures  DIAGNOSTIC STUDIES: Oxygen Saturation is 97% on RA, normal by my interpretation.    COORDINATION OF CARE: 1:40 PM Discussed treatment plan with pt at bedside and pt agreed to plan.  Labs Review Labs Reviewed - No data to display  Imaging Review No results found.   EKG Interpretation None      MDM   Final diagnoses:  Right arm pain   49 y.o. M with right lateral elbow pain.  Denies known injury, trauma, or falls.  No deformities noted on exam, mild tenderness of right lateral elbow. Doubt acute fracture or dislocation. Patient does construction/painting with repetitive motions daily, possible lateral epicondylitis with ulnar nerve irritation.  Encourages RICE routine at home, pain meds and anti-inflammatories given.  FU with orthopedics if no improvement in 1 week or if symptoms worsen.  Discussed plan with patient, he/she acknowledged understanding and agreed with plan of care.  Return precautions given for new or worsening symptoms.  I personally performed the services described in this documentation, which was scribed in my presence. The recorded information has been reviewed and is accurate.  Garlon HatchetLisa M Alveena Taira, PA-C 05/13/14 1418  Marisa Severinlga Otter, MD 05/14/14 [phone]

## 2014-05-13 NOTE — ED Notes (Addendum)
Pt states 2 weeks ago, squeezing out pain cloth and felt tug in rt arm.  Pt states pain since then.  Now pain is going from elbow to pinky finger.  Hurts worse at night but feels throughout the day.  Worse with activity.

## 2014-05-31 ENCOUNTER — Encounter (HOSPITAL_COMMUNITY): Payer: Self-pay | Admitting: Emergency Medicine

## 2014-05-31 ENCOUNTER — Emergency Department (HOSPITAL_COMMUNITY): Payer: 59

## 2014-05-31 ENCOUNTER — Emergency Department (HOSPITAL_COMMUNITY)
Admission: EM | Admit: 2014-05-31 | Discharge: 2014-05-31 | Disposition: A | Discharge status: Home / Self Care | Payer: 59 | Attending: Emergency Medicine | Admitting: Emergency Medicine

## 2014-05-31 DIAGNOSIS — X58XXXA Exposure to other specified factors, initial encounter: Secondary | ICD-10-CM | POA: Insufficient documentation

## 2014-05-31 DIAGNOSIS — Z720 Tobacco use: Secondary | ICD-10-CM | POA: Insufficient documentation

## 2014-05-31 DIAGNOSIS — Y9389 Activity, other specified: Secondary | ICD-10-CM | POA: Insufficient documentation

## 2014-05-31 DIAGNOSIS — Z79899 Other long term (current) drug therapy: Secondary | ICD-10-CM | POA: Insufficient documentation

## 2014-05-31 DIAGNOSIS — Z8669 Personal history of other diseases of the nervous system and sense organs: Secondary | ICD-10-CM | POA: Insufficient documentation

## 2014-05-31 DIAGNOSIS — Y9289 Other specified places as the place of occurrence of the external cause: Secondary | ICD-10-CM | POA: Insufficient documentation

## 2014-05-31 DIAGNOSIS — Y998 Other external cause status: Secondary | ICD-10-CM | POA: Insufficient documentation

## 2014-05-31 DIAGNOSIS — Z791 Long term (current) use of non-steroidal anti-inflammatories (NSAID): Secondary | ICD-10-CM | POA: Insufficient documentation

## 2014-05-31 DIAGNOSIS — S39012A Strain of muscle, fascia and tendon of lower back, initial encounter: Secondary | ICD-10-CM | POA: Insufficient documentation

## 2014-05-31 MED ORDER — CYCLOBENZAPRINE HCL 5 MG PO TABS: 5.0000 mg | ORAL_TABLET | Freq: Two times a day (BID) | ORAL | Status: DC | PRN

## 2014-05-31 MED ORDER — IBUPROFEN 800 MG PO TABS: 800.0000 mg | ORAL_TABLET | Freq: Three times a day (TID) | ORAL | Status: DC

## 2014-05-31 NOTE — ED Provider Notes (Signed)
CSN: [id number]     Arrival date & time 05/31/14  0746 History   First MD Initiated Contact with Patient 05/31/14 [id number]     Chief Complaint  Patient presents with  . Back Pain     (Consider location/radiation/quality/duration/timing/severity/associated sxs/prior Treatment) HPI Comments: Pt comes in with c/o lower back pain since yesterday. Denies numbness, weakness or incontinence. Has tried ibuprofen for pain. States that he has been doing heavy work and then yesterday he felt like he couldn't stand up straight. Does have a history or back pain in the past.  The history is provided by the patient. No language interpreter was used.    Past Medical History  Diagnosis Date  . Cluster headaches    Past Surgical History  Procedure Laterality Date  . Wrist surgery      nerve repair   History reviewed. No pertinent family history. History  Substance Use Topics  . Smoking status: Current Some Day Smoker -- 0.15 packs/day    Types: Cigarettes  . Smokeless tobacco: Not on file  . Alcohol Use: No    Review of Systems  All other systems reviewed and are negative.     Allergies  Bee venom  Home Medications   Prior to Admission medications   Medication Sig Start Date End Date Taking? Authorizing Provider  benzonatate (TESSALON) 100 MG capsule Take 1 capsule (100 mg total) by mouth every 8 (eight) hours. Patient not taking: Reported on 12/25/2013 10/01/13   Mellody DrownLauren Parker, PA-C  dextromethorphan-guaiFENesin [facility](MUCINEX DM) 30-600 MG per 12 hr tablet Take 1 tablet by mouth 2 (two) times daily. Patient not taking: Reported on 12/25/2013 10/01/13   Mellody DrownLauren Parker, PA-C  doxycycline (VIBRAMYCIN) 100 MG capsule Take 1 capsule (100 mg total) by mouth 2 (two) times daily. X 7 days Patient not taking: Reported on 12/25/2013 11/20/13   Mathis FareJennifer Lee H Presson, PA  EPINEPHrine 0.3 mg/0.3 mL IJ SOAJ injection Inject 0.3 mg into the muscle daily as needed. Allergic reaction    Historical Provider,  MD  HYDROcodone-acetaminophen (NORCO/VICODIN) 5-325 MG per tablet Take 1 tablet by mouth every 4 (four) hours as needed. 05/13/14   Garlon HatchetLisa M Sanders, PA-C  methocarbamol (ROBAXIN) 500 MG tablet Take 2 tablets (1,000 mg total) by mouth 4 (four) times daily as needed (Pain). Patient not taking: Reported on 12/25/2013 10/22/13   Joni ReiningNicole Pisciotta, PA-C  methocarbamol (ROBAXIN) 500 MG tablet Take 2 tablets (1,000 mg total) by mouth 4 (four) times daily. Patient not taking: Reported on 12/25/2013 11/04/13   Renne CriglerJoshua Geiple, PA-C  naproxen (NAPROSYN) 500 MG tablet Take 1 tablet (500 mg total) by mouth 2 (two) times daily with a meal. 05/13/14   Garlon HatchetLisa M Sanders, PA-C  nystatin (MYCOSTATIN/NYSTOP) 100000 UNIT/GM POWD Apply to affected areas BID x 14 days Patient not taking: Reported on 12/25/2013 11/20/13   Ria ClockJennifer Lee H Presson, PA  triamcinolone cream (KENALOG) 0.1 % Apply 1 application topically 2 (two) times daily. Patient not taking: Reported on 02/08/2014 01/06/14   Junius FinnerErin O'Malley, PA-C   BP 118/74 mmHg  Pulse 73  Temp(Src) 98 F (36.7 C) (Oral)  Resp 18  SpO2 99% Physical Exam  Constitutional: He is oriented to person, place, and time. He appears well-developed and well-nourished.  Cardiovascular: Normal rate, regular rhythm and normal heart sounds.   Pulmonary/Chest: Effort normal and breath sounds normal.  Musculoskeletal: Normal range of motion.  Generalized lumbar tenderness with palpation. Full rom or bilateral lower extremities. Full strength and sensation to the  bilateral lower extremities  Neurological: He is alert and oriented to person, place, and time. Coordination normal.  Skin: Skin is warm and dry.  Nursing note and vitals reviewed.   ED Course  Procedures (including critical care time) Labs Review Labs Reviewed - No data to display  Imaging Review Dg Lumbar Spine Complete  05/31/2014   CLINICAL DATA:  Low back pain.  Posterior leg pain.  EXAM: LUMBAR SPINE - COMPLETE 4+ VIEW   COMPARISON:  04/12/2006  FINDINGS: Mild degenerative facet arthropathy bilaterally at L5-S1 and on the right at L4-5. Intervertebral disc spaces are preserved. No significant malalignment. Minimal anterior superior endplate spurring at L5.  IMPRESSION: 1. Mild lower lumbar spondylosis, without fracture or subluxation observed.   Electronically Signed   By: Gaylyn Rong M.D.   On: 05/31/2014 09:01     EKG Interpretation None      MDM   Final diagnoses:  Lumbar strain, initial encounter   Pt acute injury noted to the back. Pt is neurologically intact. Send home with ibuprofen and flexeril. Given ortho follow up   Teressa Lower, NP 05/31/14 6962  Mancel Bale, MD 05/31/14 [phone]

## 2014-05-31 NOTE — ED Notes (Signed)
Patient transported to X-ray without distress.  

## 2014-05-31 NOTE — ED Notes (Signed)
PT ambulated with baseline gait; VSS; A&Ox3; no signs of distress; respirations even and unlabored; skin warm and dry; no questions upon discharge.  

## 2014-05-31 NOTE — Discharge Instructions (Signed)

## 2014-05-31 NOTE — ED Notes (Signed)
Pt having back pain. States been doing heavy lifting at work. Needs work UDS.

## 2014-05-31 NOTE — ED Notes (Signed)
Patient still in xray  

## 2014-05-31 NOTE — ED Notes (Signed)
Denies stool or urine incontinence.

## 2014-08-23 ENCOUNTER — Emergency Department (HOSPITAL_COMMUNITY)
Admission: EM | Admit: 2014-08-23 | Discharge: 2014-08-23 | Disposition: A | Discharge status: Home / Self Care | Payer: 59 | Attending: Emergency Medicine | Admitting: Emergency Medicine

## 2014-08-23 ENCOUNTER — Encounter (HOSPITAL_COMMUNITY): Payer: Self-pay | Admitting: Emergency Medicine

## 2014-08-23 ENCOUNTER — Emergency Department (HOSPITAL_COMMUNITY): Payer: 59

## 2014-08-23 ENCOUNTER — Other Ambulatory Visit: Payer: Self-pay

## 2014-08-23 DIAGNOSIS — M79605 Pain in left leg: Secondary | ICD-10-CM | POA: Insufficient documentation

## 2014-08-23 DIAGNOSIS — M79604 Pain in right leg: Secondary | ICD-10-CM | POA: Insufficient documentation

## 2014-08-23 DIAGNOSIS — R0789 Other chest pain: Secondary | ICD-10-CM | POA: Insufficient documentation

## 2014-08-23 DIAGNOSIS — M79601 Pain in right arm: Secondary | ICD-10-CM | POA: Insufficient documentation

## 2014-08-23 DIAGNOSIS — Z8669 Personal history of other diseases of the nervous system and sense organs: Secondary | ICD-10-CM | POA: Insufficient documentation

## 2014-08-23 DIAGNOSIS — R51 Headache: Secondary | ICD-10-CM | POA: Insufficient documentation

## 2014-08-23 DIAGNOSIS — Z720 Tobacco use: Secondary | ICD-10-CM | POA: Insufficient documentation

## 2014-08-23 DIAGNOSIS — M79602 Pain in left arm: Secondary | ICD-10-CM | POA: Insufficient documentation

## 2014-08-23 DIAGNOSIS — M549 Dorsalgia, unspecified: Secondary | ICD-10-CM | POA: Insufficient documentation

## 2014-08-23 DIAGNOSIS — F1410 Cocaine abuse, uncomplicated: Secondary | ICD-10-CM

## 2014-08-23 HISTORY — DX: Other psychoactive substance abuse, uncomplicated: F19.10

## 2014-08-23 LAB — I-STAT TROPONIN, ED: Troponin i, poc: 0 ng/mL (ref 0.00–0.08)

## 2014-08-23 LAB — BASIC METABOLIC PANEL
Anion gap: 9 (ref 5–15)
BUN: 12 mg/dL (ref 6–20)
CHLORIDE: 107 mmol/L (ref 101–111)
CO2: 24 mmol/L (ref 22–32)
CREATININE: 1.04 mg/dL (ref 0.61–1.24)
Calcium: 9.2 mg/dL (ref 8.9–10.3)
GFR calc Af Amer: >60 mL/min (ref >60)
GFR calc non Af Amer: >60 mL/min (ref >60)
Glucose, Bld: 112 mg/dL — ABNORMAL HIGH (ref 65–99)
Potassium: 3.6 mmol/L (ref 3.5–5.1)
Sodium: 140 mmol/L (ref 135–145)

## 2014-08-23 LAB — CBC
HEMATOCRIT: 40 % (ref 39.0–52.0)
Hemoglobin: 14 g/dL (ref 13.0–17.0)
MCH: 32.3 pg (ref 26.0–34.0)
MCHC: 35 g/dL (ref 30.0–36.0)
MCV: 92.4 fL (ref 78.0–100.0)
Platelets: 328 10*3/uL (ref 150–400)
RBC: 4.33 MIL/uL (ref 4.22–5.81)
RDW: 12.4 % (ref 11.5–15.5)
WBC: 10.8 10*3/uL — ABNORMAL HIGH (ref 4.0–10.5)

## 2014-08-23 NOTE — ED Notes (Signed)
EMS stated, he has some chest pain due to crack cause he got it from a different dealer than usual.  Did not know how it got where he was at. He wants some help.  He doesn't know were his car is.  Hes pain is 8/10. 18 g in left hand.  4 baby ASA, One Ntg. No relief.  Pt. Stated, I had about 20 dollars of crack around 10.  I don't know what went on after that. C/O chest pain and stating , I need some help.

## 2014-08-23 NOTE — ED Notes (Signed)
[employer] informed this RN the pt car was found. Pt informed.

## 2014-08-23 NOTE — Discharge Instructions (Signed)
Use one of the resources listed below, to help you with avoiding use of cocaine.    Stimulant Use Disorder-Cocaine Cocaine is one of a group of powerful drugs called stimulants. Cocaine has medical uses for stopping nosebleeds and for pain control before minor nose or dental surgery. However, cocaine is misused because of the effects that it produces. These effects include:   A feeling of extreme pleasure.  Alertness.  High energy. Common street names for cocaine include coke, crack, blow, snow, and nose candy. Cocaine is snorted, dissolved in water and injected, or smoked.  Stimulants are addictive because they activate regions of the brain that produce both the pleasurable sensation of "reward" and psychological dependence. Together, these actions account for loss of control and the rapid development of drug dependence. This means you become ill without the drug (withdrawal) and need to keep using it to function.  Stimulant use disorder is use of stimulants that disrupts your daily life. It disrupts relationships with family and friends and how you do your job. Cocaine increases your blood pressure and heart rate. It can cause a heart attack or stroke. Cocaine can also cause death from irregular heart rate or seizures. SYMPTOMS Symptoms of stimulant use disorder with cocaine include:  Use of cocaine in larger amounts or over a longer period of time than intended.  Unsuccessful attempts to cut down or control cocaine use.  A lot of time spent obtaining, using, or recovering from the effects of cocaine.  A strong desire or urge to use cocaine (craving).  Continued use of cocaine in spite of major problems at work, school, or home because of use.  Continued use of cocaine in spite of relationship problems because of use.  Giving up or cutting down on important life activities because of cocaine use.  Use of cocaine over and over in situations when it is physically hazardous, such as  driving a car.  Continued use of cocaine in spite of a physical problem that is likely related to use. Physical problems can include:  Malnutrition.  Nosebleeds.  Chest pain.  High blood pressure.  A hole that develops between the part of your nose that separates your nostrils (perforated nasal septum).  Lung and kidney damage.  Continued use of cocaine in spite of a mental problem that is likely related to use. Mental problems can include:  Schizophrenia-like symptoms.  Depression.  Bipolar mood swings.  Anxiety.  Sleep problems.  Need to use more and more cocaine to get the same effect, or lessened effect over time with use of the same amount of cocaine (tolerance).  Having withdrawal symptoms when cocaine use is stopped, or using cocaine to reduce or avoid withdrawal symptoms. Withdrawal symptoms include:  Depressed or irritable mood.  Low energy or restlessness.  Bad dreams.  Poor or excessive sleep.  Increased appetite. DIAGNOSIS Stimulant use disorder is diagnosed by your health care provider. You may be asked questions about your cocaine use and how it affects your life. A physical exam may be done. A drug screen may be ordered. You may be referred to a mental health professional. The diagnosis of stimulant use disorder requires at least two symptoms within 12 months. The type of stimulant use disorder depends on the number of signs and symptoms you have. The type may be:  Mild. Two or three signs and symptoms.  Moderate. Four or five signs and symptoms.  Severe. Six or more signs and symptoms. TREATMENT Treatment for stimulant use disorder  is usually provided by mental health professionals with training in substance use disorders. The following options are available:  Counseling or talk therapy. Talk therapy addresses the reasons you use cocaine and ways to keep you from using again. Goals of talk therapy include:  Identifying and avoiding triggers for  use.  Handling cravings.  Replacing use with healthy activities.  Support groups. Support groups provide emotional support, advice, and guidance.  Medicine. Certain medicines may decrease cocaine cravings or withdrawal symptoms. HOME CARE INSTRUCTIONS  Take medicines only as directed by your health care provider.  Identify the people and activities that trigger your cocaine use and avoid them.  Keep all follow-up visits as directed by your health care provider. SEEK MEDICAL CARE IF:  Your symptoms get worse or you relapse.  You are not able to take medicines as directed. SEEK IMMEDIATE MEDICAL CARE IF:  You have serious thoughts about hurting yourself or others.  You have a seizure, chest pain, sudden weakness, or loss of speech or vision. FOR MORE INFORMATION  National Institute on Drug Abuse: http://www.price-Adcock.com/  Substance Abuse and Mental Health Services Administration: SkateOasis.com.pt Document Released: 01/16/2000 Document Revised: 06/04/2013 Document Reviewed: 01/31/2013 [facility] Patient Information 2015 [place], [place]. This information is not intended to replace advice given to you by your health care provider. Make sure you discuss any questions you have with your health care provider. Behavioral Health Resources in the [facility]  Intensive Outpatient Programs: [facility]      [address] [place], [place] [phone] Both a day and evening program       [facility] Outpatient     [address]        [place], [place] [address] [phone]         ADS: [facility] [address] [place] [phone]  [facility] Mental Health ACCESS LINE: [phone] or [phone] [address] [place], [place] [address] EntrepreneurLoan.[email or web address]  Mobile Crisis Teams:                                        [facility]         Mobile Crisis Care  Unit [phone]             [facility] [address] Ginette Otto [phone]                                         [facility] [address] [place] [place] [phone]  Self-Help/Support Groups: Mental Health Assoc[facility] of [employer] of support groups [phone] (call for more info)  Narcotics Anonymous (NA) Caring [facility] [address] [place] - 2 meetings at this location  Residential Treatment Programs:  [facility]      [address]        [place]       [phone]         [facility] [address] [place] [address] [place], [place]  [address] [phone]  [facility]  [address] [place], [place] [address] [phone] Admissions: 8am-3pm M-F  [facility]     [address]        [place], [place]  [address]       [phone]         [facility] [address] [place], [place] [phone]  [facility] [address] [place], [place] [phone]  [facility] - Intensive Outpatient      [address]     [place], [place]       [phone]         [facility] ([facility]     [address] [place], [place] [phone] or [phone]  [facility] ([facility])  [address] [place], [place] [phone]  [facility] [address]                                               [address] [place] [phone]  [facility] Resources: [facility]423 [phone]               General Therapy                                                Angie Fava, PhD        [address] [place], [place] [address]         [phone]   Insurance  [facility]   [address] [place], [place] [address] [phone]  [facility] [address]  [place], [place] [address] [phone] Insurance/Medicaid/sponsorship through [facility] and Families                                              [address]                                        [place], [place] [address]    Therapy/tele-psych/case         [phone]          [facility] [address], [place]  [address]  Adolescent/group home/case management [phone]                                           Creola Corn PhD       General therapy       Insurance   [phone]         Dr. Lolly Mustache Insurance [phone] M-F  [facility] Medicaid, sponsorship [phone]

## 2014-08-23 NOTE — ED Provider Notes (Signed)
CSN: [id number]     Arrival date & time 08/23/14  4098 History   First MD Initiated Contact with Patient 08/23/14 [phone]     Chief Complaint  Patient presents with  . Chest Pain  . Drug Problem     (Consider location/radiation/quality/duration/timing/severity/associated sxs/prior Treatment) Patient is a 49 y.o. male presenting with chest pain and drug problem. The history is provided by the patient.  Chest Pain Drug Problem Associated symptoms include chest pain.   KAMAURY CUTBIRTH is a 49 y.o. male who presents for evaluation of syncope. Patient was found outside, in someone's yard, apparently passed out. EMS was called, found him and he was alert. He told them that he was having chest pain so they treated him with aspirin and nitroglycerin. The patient's car was found nearby. He states he was out last night and using crack cocaine. He denies use of alcohol or other intrathecal drugs. He is presenting complaint to me. His generalized achiness of head, chest, back, arms and legs. He states that he wants to have some help to stop using cocaine. He denies cough, weakness, dizziness, nausea or vomiting. 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 There are no other known modifying factors.    Past Medical History  Diagnosis Date  . Cluster headaches   . Drug abuse    Past Surgical History  Procedure Laterality Date  . Wrist surgery      nerve repair   No family history on file. History  Substance Use Topics  . Smoking status: Current Some Day Smoker -- 0.15 packs/day    Types: Cigarettes  . Smokeless tobacco: Not on file  . Alcohol Use: No    Review of Systems  Cardiovascular: Positive for chest pain.  All other systems reviewed and are negative.     Allergies  Bee venom  Home Medications   Prior to Admission medications   Medication Sig Start Date End Date Taking? Authorizing Provider  EPINEPHrine 0.3 mg/0.3 mL IJ SOAJ injection Inject 0.3 mg into the muscle daily as needed  (allergic reaction).     Historical Provider, MD   BP 139/70 mmHg  Pulse 77  Temp(Src) 98.2 F (36.8 C)  Resp 16  SpO2 98% Physical Exam  Constitutional: He is oriented to person, place, and time. He appears well-developed and well-nourished.  HENT:  Head: Normocephalic and atraumatic.  Right Ear: External ear normal.  Left Ear: External ear normal.  Eyes: Conjunctivae and EOM are normal. Pupils are equal, round, and reactive to light.  Neck: Normal range of motion and phonation normal. Neck supple.  Cardiovascular: Normal rate, regular rhythm and normal heart sounds.   Pulmonary/Chest: Effort normal and breath sounds normal. He exhibits tenderness (Mild lower chest wall tenderness, bilaterally, without associated crepitation or deformity). He exhibits no bony tenderness.  Abdominal: Soft. There is no tenderness.  Musculoskeletal: Normal range of motion. He exhibits no edema or tenderness.  Neurological: He is alert and oriented to person, place, and time. No cranial nerve deficit or sensory deficit. He exhibits normal muscle tone. Coordination normal.  Skin: Skin is warm, dry and intact.  Psychiatric: He has a normal mood and affect. His behavior is normal. Judgment and thought content normal.  Nursing note and vitals reviewed.   ED Course  Procedures (including critical care time) Medications - No data to display  Patient Vitals for the past 24 hrs:  BP Temp Pulse Resp SpO2  08/23/14 1045 118/70 mmHg - 70 17 93 %  08/23/14 1040 139/70 mmHg - 77 16 98 %  08/23/14 0915 115/71 mmHg - 69 12 97 %  08/23/14 0910 120/67 mmHg - 67 13 97 %  08/23/14 0852 132/66 mmHg 98.2 F (36.8 C) 101 16 96 %    Findings discussed with the patient, all questions were answered    Labs Review Labs Reviewed  BASIC METABOLIC PANEL - Abnormal; Notable for the following:    Glucose, Bld 112 (*)    All other components within normal limits  CBC - Abnormal; Notable for the following:    WBC 10.8  (*)    All other components within normal limits  Rosezena Sensor, ED    Imaging Review Dg Chest 2 View  08/23/2014   CLINICAL DATA:  Chest pain.  EXAM: CHEST  2 VIEW  COMPARISON:  None.  FINDINGS: Mediastinum and hilar structures normal. Low lung volumes with basilar subsegmental atelectasis. Heart size normal. No pleural effusion or pneumothorax. No acute bony abnormality.  IMPRESSION: Low lung volumes with mild basilar subsegmental atelectasis.   Electronically Signed   By: Maisie Fus  Register   On: 08/23/2014 09:32     EKG Interpretation   Date/Time:  Friday August 23 2014 08:53:22 EDT Ventricular Rate:  97 PR Interval:  174 QRS Duration: 70 QT Interval:  354 QTC Calculation: 449 R Axis:   71 Text Interpretation:  Normal sinus rhythm Septal infarct , age  undetermined Abnormal ECG since last tracing no significant change  Confirmed by Effie Shy  MD, Georgette Helmer [id number]) on 08/23/2014 9:11:51 AM      MDM   Final diagnoses:  Cocaine abuse    Cocaine abuse, with syncope. Doubt ACS, PE, pneumonia, or metabolic instability. There is no indication for hospitalization or psychiatric treatment for substance abuse at this time.  Nursing Notes Reviewed/ Care Coordinated Applicable Imaging Reviewed Interpretation of Laboratory Data incorporated into ED treatment  The patient appears reasonably screened and/or stabilized for discharge and I doubt any other medical condition or other [facility] requiring further screening, evaluation, or treatment in the ED at this time prior to discharge.  Plan: Home Medications- none; Home Treatments- rest, avoid cocaine; return here if the recommended treatment, does not improve the symptoms; Recommended follow up- PCP prn     Mancel Bale, MD 08/23/14 1115

## 2014-08-23 NOTE — ED Notes (Signed)
Pt is in stable condition upon d/c and ambulates from ED. 

## 2014-09-02 ENCOUNTER — Emergency Department (HOSPITAL_COMMUNITY)
Admission: EM | Admit: 2014-09-02 | Discharge: 2014-09-02 | Disposition: A | Discharge status: Home / Self Care | Payer: 59 | Attending: Emergency Medicine | Admitting: Emergency Medicine

## 2014-09-02 ENCOUNTER — Encounter (HOSPITAL_COMMUNITY): Payer: Self-pay | Admitting: Emergency Medicine

## 2014-09-02 DIAGNOSIS — Z8669 Personal history of other diseases of the nervous system and sense organs: Secondary | ICD-10-CM | POA: Insufficient documentation

## 2014-09-02 DIAGNOSIS — R369 Urethral discharge, unspecified: Secondary | ICD-10-CM | POA: Insufficient documentation

## 2014-09-02 DIAGNOSIS — Z720 Tobacco use: Secondary | ICD-10-CM | POA: Insufficient documentation

## 2014-09-02 DIAGNOSIS — R300 Dysuria: Secondary | ICD-10-CM | POA: Insufficient documentation

## 2014-09-02 LAB — URINALYSIS, ROUTINE W REFLEX MICROSCOPIC
Glucose, UA: NEGATIVE mg/dL
Hgb urine dipstick: NEGATIVE
Ketones, ur: NEGATIVE mg/dL
Leukocytes, UA: NEGATIVE
Nitrite: NEGATIVE
Protein, ur: NEGATIVE mg/dL
Specific Gravity, Urine: 1.024 (ref 1.005–1.030)
UROBILINOGEN UA: 0.2 mg/dL (ref 0.0–1.0)
pH: 5 (ref 5.0–8.0)

## 2014-09-02 LAB — CBG MONITORING, ED: Glucose-Capillary: 76 mg/dL (ref 65–99)

## 2014-09-02 MED ORDER — AZITHROMYCIN 250 MG PO TABS
1000.0000 mg | ORAL_TABLET | Freq: Once | ORAL | Status: AC
  Administered 2014-09-02: 1000 mg via ORAL
  Filled 2014-09-02: qty 4

## 2014-09-02 MED ORDER — CEFTRIAXONE SODIUM 250 MG IJ SOLR
250.0000 mg | Freq: Once | INTRAMUSCULAR | Status: AC
Start: 2014-09-02 — End: 2014-09-02
  Administered 2014-09-02: 250 mg via INTRAMUSCULAR
  Filled 2014-09-02: qty 250

## 2014-09-02 NOTE — ED Notes (Signed)
Pt complaint of dysuria and groin pain.

## 2014-09-02 NOTE — ED Provider Notes (Signed)
CSN: [id number]     Arrival date & time 09/02/14  1517 History   First MD Initiated Contact with Patient 09/02/14 1704     Chief Complaint  Patient presents with  . Dysuria     (Consider location/radiation/quality/duration/timing/severity/associated sxs/prior Treatment) HPI Comments: Patient presents today with a chief complaint of dysuria.  He reports onset of symptoms yesterday, but worse today.  He also reports that he had some whitish colored penile discharge this morning.  He is sexually active with his wife.  He denies fever, chills, nausea, vomiting, abdominal pain, scrotal pain, or scrotal swelling.  No rectal pain or pain with BM.  He does report increased urinary frequency and polydipsia.    The history is provided by the patient.    Past Medical History  Diagnosis Date  . Cluster headaches   . Drug abuse    Past Surgical History  Procedure Laterality Date  . Wrist surgery      nerve repair   No family history on file. History  Substance Use Topics  . Smoking status: Current Some Day Smoker -- 0.15 packs/day    Types: Cigarettes  . Smokeless tobacco: Not on file  . Alcohol Use: No    Review of Systems  All other systems reviewed and are negative.     Allergies  Bee venom  Home Medications   Prior to Admission medications   Medication Sig Start Date End Date Taking? Authorizing Provider  EPINEPHrine 0.3 mg/0.3 mL IJ SOAJ injection Inject 0.3 mg into the muscle daily as needed (allergic reaction).     Historical Provider, MD   BP 142/80 mmHg  Pulse 70  Temp(Src) 98.1 F (36.7 C) (Oral)  Resp 18  SpO2 100% Physical Exam  Constitutional: He appears well-developed and well-nourished.  HENT:  Mouth/Throat: Oropharynx is clear and moist.  Neck: Normal range of motion. Neck supple.  Cardiovascular: Normal rate, regular rhythm and normal heart sounds.   Pulmonary/Chest: Effort normal and breath sounds normal.  Genitourinary: Penis normal. Right testis  shows no mass, no swelling and no tenderness. Left testis shows no mass, no swelling and no tenderness.  Neurological: He is alert.  Skin: Skin is warm and dry.  Psychiatric: He has a normal mood and affect.  Nursing note and vitals reviewed.   ED Course  Procedures (including critical care time) Labs Review Labs Reviewed  URINALYSIS, ROUTINE W REFLEX MICROSCOPIC (NOT AT [facility]) - Abnormal; Notable for the following:    Bilirubin Urine SMALL (*)    All other components within normal limits  CBG MONITORING, ED  GC/CHLAMYDIA PROBE AMP ([place]) NOT AT [facility]    Imaging Review No results found.   EKG Interpretation None      MDM   Final diagnoses:  None   Patient presents today with dysuria since yesterday and penile discharge this morning.  UA is negative for infection.  GC/Chlamydia pending.  No scrotal pain or swelling.  Normal GU exam.  Patient requested prophylactic treatment with Azithromycin and Rocephin.  Stable for discharge.  Return precautions given.      Santiago Glad, PA-C 09/03/14 0021  Melene Plan, DO 09/03/14 2341

## 2014-09-02 NOTE — Discharge Instructions (Signed)

## 2014-09-03 LAB — GC/CHLAMYDIA PROBE AMP (~~LOC~~) NOT AT ARMC
Chlamydia: NEGATIVE
Neisseria Gonorrhea: NEGATIVE

## 2016-10-04 ENCOUNTER — Emergency Department (HOSPITAL_COMMUNITY)
Admission: EM | Admit: 2016-10-04 | Discharge: 2016-10-04 | Disposition: A | Discharge status: Home / Self Care | Payer: Self-pay | Attending: Emergency Medicine | Admitting: Emergency Medicine

## 2016-10-04 ENCOUNTER — Emergency Department (HOSPITAL_COMMUNITY): Payer: Self-pay

## 2016-10-04 ENCOUNTER — Encounter (HOSPITAL_COMMUNITY): Payer: Self-pay | Admitting: Emergency Medicine

## 2016-10-04 DIAGNOSIS — F17210 Nicotine dependence, cigarettes, uncomplicated: Secondary | ICD-10-CM | POA: Insufficient documentation

## 2016-10-04 DIAGNOSIS — R519 Headache, unspecified: Secondary | ICD-10-CM

## 2016-10-04 DIAGNOSIS — Z91010 Allergy to peanuts: Secondary | ICD-10-CM | POA: Insufficient documentation

## 2016-10-04 DIAGNOSIS — R51 Headache: Secondary | ICD-10-CM | POA: Insufficient documentation

## 2016-10-04 HISTORY — DX: Migraine, unspecified, not intractable, without status migrainosus: G43.909

## 2016-10-04 MED ORDER — SODIUM CHLORIDE 0.9 % IV BOLUS (SEPSIS)
1000.0000 mL | Freq: Once | INTRAVENOUS | Status: AC
  Administered 2016-10-04: 1000 mL via INTRAVENOUS

## 2016-10-04 MED ORDER — DIPHENHYDRAMINE HCL 50 MG/ML IJ SOLN
25.0000 mg | Freq: Once | INTRAMUSCULAR | Status: AC
  Administered 2016-10-04: 25 mg via INTRAVENOUS
  Filled 2016-10-04: qty 1

## 2016-10-04 MED ORDER — PROCHLORPERAZINE EDISYLATE 5 MG/ML IJ SOLN
10.0000 mg | Freq: Once | INTRAMUSCULAR | Status: AC
  Administered 2016-10-04: 10 mg via INTRAVENOUS
  Filled 2016-10-04: qty 2

## 2016-10-04 NOTE — Discharge Instructions (Signed)
Please read and follow all provided instructions.  Your diagnoses today include:  1. Acute nonintractable headache, unspecified headache type     Tests performed today include: CT of your head which was normal and did not show any serious cause of your headache Vital signs. See below for your results today.   Medications:  In the Emergency Department you received: Reglan - antinausea/headache medication Benadryl - antihistamine to counteract potential side effects of reglan Toradol - NSAID medication similar to ibuprofen  Take any prescribed medications only as directed.  Additional information:  Follow any educational materials contained in this packet.  You are having a headache. No specific cause was found today for your headache. It may have been a migraine or other cause of headache. Stress, anxiety, fatigue, and depression are common triggers for headaches.   Your headache today does not appear to be life-threatening or require hospitalization, but often the exact cause of headaches is not determined in the emergency department. Therefore, follow-up with your doctor is very important to find out what may have caused your headache and whether or not you need any further diagnostic testing or treatment.   Sometimes headaches can appear benign (not harmful), but then more serious symptoms can develop which should prompt an immediate re-evaluation by your doctor or the emergency department.  BE VERY CAREFUL not to take multiple medicines containing Tylenol (also called acetaminophen). Doing so can lead to an overdose which can damage your liver and cause liver failure and possibly death.   Follow-up instructions: Please follow-up with your primary care provider in the next 3 days for further evaluation of your symptoms.   Return instructions:  Please return to the Emergency Department if you experience worsening symptoms. Return if the medications do not resolve your headache, if  it recurs, or if you have multiple episodes of vomiting or cannot keep down fluids. Return if you have a change from the usual headache. RETURN IMMEDIATELY IF you: Develop a sudden, severe headache Develop confusion or become poorly responsive or faint Develop a fever above 100.15F or problem breathing Have a change in speech, vision, swallowing, or understanding Develop new weakness, numbness, tingling, incoordination in your arms or legs Have a seizure Please return if you have any other emergent concerns.  Additional Information:  Your vital signs today were: BP (!) 145/85 (BP Location: Left Arm)    Pulse (!) 57    Temp 98.1 F (36.7 C)    Resp 18    SpO2 98%  If your blood pressure (BP) was elevated above 135/85 this visit, please have this repeated by your doctor within one month. --------------

## 2016-10-04 NOTE — ED Triage Notes (Signed)
Pt c/o headache x 3 weeks. Pt states the headache is different than his normal migraines. Pt states he has tried extra strength tylenol and no improvement. Pt states the headache is intermittent, nausea at times.

## 2016-10-04 NOTE — ED Provider Notes (Signed)
[facility]-EMERGENCY DEPT Provider Note   CSN: [id number] Arrival date & time: 10/04/16  1123     History   Chief Complaint Chief Complaint  Patient presents with  . Headache    HPI Raymond Mcclure is a 51 y.o. male.  HPI  51 y.o. male with a hx of Migraines, presents to the Emergency Department today due to headache x 3 weeks. Notes headache is different from normal migraines. Attempted extra strength Tylenol with minimal improvement. Notes intermittent headaches with associated nausea at times. Rates headaches pain 10/10 and circumferential. No photophobia. NO visual loss. No blurriness or double vision. Denies N/V. No CP/SOB/ABD pain. No numbness/tingling. No unilateral weakness. No fevers. No neck stiffness. States headache is intermittent and worse in the evenings. No other symptoms noted.    Past Medical History:  Diagnosis Date  . Cluster headaches   . Drug abuse   . Migraines     There are no active problems to display for this patient.   Past Surgical History:  Procedure Laterality Date  . WRIST SURGERY     nerve repair       Home Medications    Prior to Admission medications   Medication Sig Start Date End Date Taking? Authorizing Provider  acetaminophen (TYLENOL) 500 MG tablet Take 2,000 mg by mouth every 4 (four) hours as needed for moderate pain.   Yes [provider]    Family History History reviewed. No pertinent family history.  Social History Social History  Substance Use Topics  . Smoking status: Current Some Day Smoker    Packs/day: 0.15    Types: Cigarettes  . Smokeless tobacco: Never Used  . Alcohol use No     Allergies   Bee venom and Peanut oil   Review of Systems Review of Systems ROS reviewed and all are negative for acute change except as noted in the HPI.  Physical Exam Updated Vital Signs BP (!) 145/85 (BP Location: Left Arm)   Pulse (!) 57   Temp 98.1 F (36.7 C)   Resp 18   SpO2 98%   Physical Exam    Constitutional: He is oriented to person, place, and time. Vital signs are normal. He appears well-developed and well-nourished. No distress.  HENT:  Head: Normocephalic and atraumatic.  Right Ear: Hearing, tympanic membrane, external ear and ear canal normal.  Left Ear: Hearing, tympanic membrane, external ear and ear canal normal.  Nose: Nose normal.  Mouth/Throat: Uvula is midline, oropharynx is clear and moist and mucous membranes are normal. No trismus in the jaw. No oropharyngeal exudate, posterior oropharyngeal erythema or tonsillar abscesses.  Eyes: Pupils are equal, round, and reactive to light. Conjunctivae and EOM are normal.  Neck: Normal range of motion. Neck supple. No tracheal deviation present.  Cardiovascular: Normal rate, regular rhythm, S1 normal, S2 normal, normal heart sounds, intact distal pulses and normal pulses.   Pulmonary/Chest: Effort normal and breath sounds normal. No respiratory distress. He has no decreased breath sounds. He has no wheezes. He has no rhonchi. He has no rales.  Abdominal: Normal appearance and bowel sounds are normal. There is no tenderness.  Musculoskeletal: Normal range of motion.  Neurological: He is alert and oriented to person, place, and time. He has normal strength. No cranial nerve deficit or sensory deficit.  Cranial Nerves:  II: Pupils equal, round, reactive to light III,IV, VI: ptosis not present, extra-ocular motions intact bilaterally  V,VII: smile symmetric, facial light touch sensation equal VIII: hearing grossly normal  bilaterally  IX,X: midline uvula rise  XI: bilateral shoulder shrug equal and strong XII: midline tongue extension Finger to nose exam unremarkable Negative pronator drift   Skin: Skin is warm and dry.  Psychiatric: He has a normal mood and affect. His speech is normal and behavior is normal. Thought content normal.  Nursing note and vitals reviewed.    ED Treatments / Results  Labs (all labs ordered are  listed, but only abnormal results are displayed) Labs Reviewed - No data to display  EKG  EKG Interpretation None       Radiology Ct Head Wo Contrast  Result Date: 10/04/2016 CLINICAL DATA:  Acute severe headache EXAM: CT HEAD WITHOUT CONTRAST TECHNIQUE: Contiguous axial images were obtained from the base of the skull through the vertex without intravenous contrast. COMPARISON:  05/01/2013 FINDINGS: Brain: No evidence of acute infarction, hemorrhage, hydrocephalus, extra-axial collection or mass lesion/mass effect. Vascular: No hyperdense vessel or unexpected calcification. Skull: Normal. Negative for fracture or focal lesion. Sinuses/Orbits: No acute finding. Other: None. IMPRESSION: Normal head CT without contrast Electronically Signed   By: Judie PetitM.  Shick M.D.   On: 10/04/2016 14:46    Procedures Procedures (including critical care time)  Medications Ordered in ED Medications  sodium chloride 0.9 % bolus 1,000 mL (1,000 mLs Intravenous New Bag/Given 10/04/16 1347)  prochlorperazine (COMPAZINE) injection 10 mg (10 mg Intravenous Given 10/04/16 1348)  diphenhydrAMINE (BENADRYL) injection 25 mg (25 mg Intravenous Given 10/04/16 1348)     Initial Impression / Assessment and Plan / ED Course  I have reviewed the triage vital signs and the nursing notes.  Pertinent labs & imaging results that were available during my care of the patient were reviewed by me and considered in my medical decision making (see chart for details).  Final Clinical Impressions(s) / ED Diagnoses   {I have reviewed and evaluated the relevant imaging studies  {I have reviewed the relevant previous healthcare records.  {I obtained HPI from historian.   ED Course:  Assessment: Patient is a 51 y.o. male  with a hx of Migraines, presents to the Emergency Department today due to headache x 3 weeks. Notes headache is different from normal migraines. Attempted extra strength Tylenol with minimal improvement. Notes intermittent  headaches with associated nausea at times. Rates headaches pain 10/10 and circumferential. No photophobia. No visual loss. No blurriness or double vision. Denies N/V. No CP/SOB/ABD pain. No numbness/tingling. No unilateral weakness. No fevers. No neck stiffness. States headache is intermittent and worse in the evenings.. Patient is without high-risk features of headache including: Sudden onset/thunderclap HAt, Altered mental status, Accompanying seizure, Headache with exertion, Age > 50, History of immunocompromise, Neck or shoulder pain, Fever, Use of anticoagulation, Family history of spontaneous SAH, Concomitant drug use, Toxic exposure.  Patient has a normal complete neurological exam, normal vital signs, normal level of consciousness, no signs of meningismus, is well-appearing/non-toxic appearing, no signs of trauma. No papilledema, no pain over the temporal arteries. CT head unremarkable. No dangerous or life-threatening conditions suspected or identified by history, physical exam, and by work-up. No indications for hospitalization identified. Migraine cocktail with relief. At time of discharge, Patient is in no acute distress. Vital Signs are stable. Patient is able to ambulate. Patient able to tolerate PO.   Disposition/Plan:  DC Home Additional Verbal discharge instructions given and discussed with patient.  Pt Instructed to f/u with PCP in the next week for evaluation and treatment of symptoms. Return precautions given Pt acknowledges and agrees with  plan  Supervising Physician Lorre Nick, MD  Final diagnoses:  Acute nonintractable headache, unspecified headache type    New Prescriptions New Prescriptions   No medications on file     Audry Pili, Cordelia Poche 10/04/16 1510    Lorre Nick, MD 10/07/16 1409

## 2018-10-07 ENCOUNTER — Other Ambulatory Visit: Payer: Self-pay

## 2018-10-07 ENCOUNTER — Emergency Department (HOSPITAL_COMMUNITY)
Admission: EM | Admit: 2018-10-07 | Discharge: 2018-10-07 | Disposition: A | Discharge status: Home / Self Care | Payer: Self-pay | Attending: Emergency Medicine | Admitting: Emergency Medicine

## 2018-10-07 ENCOUNTER — Encounter (HOSPITAL_COMMUNITY): Payer: Self-pay | Admitting: *Deleted

## 2018-10-07 DIAGNOSIS — M545 Low back pain: Secondary | ICD-10-CM | POA: Insufficient documentation

## 2018-10-07 DIAGNOSIS — Z5321 Procedure and treatment not carried out due to patient leaving prior to being seen by health care provider: Secondary | ICD-10-CM | POA: Insufficient documentation

## 2018-10-07 NOTE — ED Triage Notes (Signed)
Pt complains of right lower back pain radiating down right leg since waking up this morning. Pt tried aleve w/o relief.

## 2019-05-25 ENCOUNTER — Ambulatory Visit: Payer: Self-pay | Attending: Internal Medicine

## 2019-05-25 DIAGNOSIS — Z23 Encounter for immunization: Secondary | ICD-10-CM

## 2019-05-25 NOTE — Progress Notes (Signed)
   Covid-19 Vaccination Clinic  Name:  Raymond Mcclure    MRN: [id number] DOB: 12-16-1965  05/25/2019  Raymond Mcclure was observed post Covid-19 immunization for 30 minutes based on pre-vaccination screening without incident. He was provided with Vaccine Information Sheet and instruction to access the V-Safe system.   Raymond Mcclure was instructed to call 911 with any severe reactions post vaccine: Marland Kitchen Difficulty breathing  . Swelling of face and throat  . A fast heartbeat  . A bad rash all over body  . Dizziness and weakness   Immunizations Administered    Name Date Dose VIS Date Route   Pfizer COVID-19 Vaccine 05/25/2019 10:31 AM 0.3 mL 03/28/2018 Intramuscular   Manufacturer: [employer], [employer]   Lot: [id number]   NDC: [address]-5

## 2019-06-18 ENCOUNTER — Ambulatory Visit: Payer: Self-pay | Attending: Internal Medicine

## 2019-06-18 DIAGNOSIS — Z23 Encounter for immunization: Secondary | ICD-10-CM

## 2019-06-18 NOTE — Progress Notes (Signed)
   Covid-19 Vaccination Clinic  Name:  Raymond Mcclure    MRN: [id number] DOB: 02-06-1965  06/18/2019  [name] was observed post Covid-19 immunization for 15 minutes without incident. He was provided with Vaccine Information Sheet and instruction to access the V-Safe system.   [name] was instructed to call 911 with any severe reactions post vaccine: Marland Kitchen Difficulty breathing  . Swelling of face and throat  . A fast heartbeat  . A bad rash all over body  . Dizziness and weakness   Immunizations Administered    Name Date Dose VIS Date Route   Pfizer COVID-19 Vaccine 06/18/2019 10:42 AM 0.3 mL 03/28/2018 Intramuscular   Manufacturer: [employer], [employer]   Lot: [id number]   NDC: [address]-6

## 2020-05-27 ENCOUNTER — Other Ambulatory Visit: Payer: Self-pay

## 2020-05-27 ENCOUNTER — Encounter (HOSPITAL_COMMUNITY): Payer: Self-pay | Admitting: *Deleted

## 2020-05-27 ENCOUNTER — Emergency Department (HOSPITAL_COMMUNITY)
Admission: EM | Admit: 2020-05-27 | Discharge: 2020-05-27 | Disposition: A | Discharge status: Home / Self Care | Payer: Self-pay | Attending: Emergency Medicine | Admitting: Emergency Medicine

## 2020-05-27 DIAGNOSIS — R1084 Generalized abdominal pain: Secondary | ICD-10-CM | POA: Insufficient documentation

## 2020-05-27 DIAGNOSIS — F17210 Nicotine dependence, cigarettes, uncomplicated: Secondary | ICD-10-CM | POA: Insufficient documentation

## 2020-05-27 DIAGNOSIS — R197 Diarrhea, unspecified: Secondary | ICD-10-CM | POA: Insufficient documentation

## 2020-05-27 DIAGNOSIS — R110 Nausea: Secondary | ICD-10-CM | POA: Insufficient documentation

## 2020-05-27 DIAGNOSIS — R21 Rash and other nonspecific skin eruption: Secondary | ICD-10-CM | POA: Insufficient documentation

## 2020-05-27 DIAGNOSIS — Z91010 Allergy to peanuts: Secondary | ICD-10-CM | POA: Insufficient documentation

## 2020-05-27 HISTORY — DX: Tachycardia, unspecified: R00.0

## 2020-05-27 HISTORY — DX: Obesity, unspecified: E66.9

## 2020-05-27 LAB — CBC
HCT: 43.4 % (ref 39.0–52.0)
Hemoglobin: 15.2 g/dL (ref 13.0–17.0)
MCH: 32.2 pg (ref 26.0–34.0)
MCHC: 35 g/dL (ref 30.0–36.0)
MCV: 91.9 fL (ref 80.0–100.0)
Platelets: 298 10*3/uL (ref 150–400)
RBC: 4.72 MIL/uL (ref 4.22–5.81)
RDW: 12.4 % (ref 11.5–15.5)
WBC: 9.4 10*3/uL (ref 4.0–10.5)
nRBC: 0 % (ref 0.0–0.2)

## 2020-05-27 LAB — COMPREHENSIVE METABOLIC PANEL
ALT: 42 U/L (ref 0–44)
AST: 32 U/L (ref 15–41)
Albumin: 4.3 g/dL (ref 3.5–5.0)
Alkaline Phosphatase: 100 U/L (ref 38–126)
Anion gap: 11 (ref 5–15)
BUN: 12 mg/dL (ref 6–20)
CO2: 20 mmol/L — ABNORMAL LOW (ref 22–32)
Calcium: 9.2 mg/dL (ref 8.9–10.3)
Chloride: 106 mmol/L (ref 98–111)
Creatinine, Ser: 1.06 mg/dL (ref 0.61–1.24)
GFR, Estimated: >60 mL/min (ref >60)
Glucose, Bld: 95 mg/dL (ref 70–99)
Potassium: 3.7 mmol/L (ref 3.5–5.1)
Sodium: 137 mmol/L (ref 135–145)
Total Bilirubin: 0.9 mg/dL (ref 0.3–1.2)
Total Protein: 7.6 g/dL (ref 6.5–8.1)

## 2020-05-27 LAB — URINALYSIS, ROUTINE W REFLEX MICROSCOPIC
Bilirubin Urine: NEGATIVE
Glucose, UA: NEGATIVE mg/dL
Hgb urine dipstick: NEGATIVE
Ketones, ur: NEGATIVE mg/dL
Leukocytes,Ua: NEGATIVE
Nitrite: NEGATIVE
Protein, ur: NEGATIVE mg/dL
Specific Gravity, Urine: 1.018 (ref 1.005–1.030)
pH: 5 (ref 5.0–8.0)

## 2020-05-27 LAB — LIPASE, BLOOD: Lipase: 32 U/L (ref 11–51)

## 2020-05-27 MED ORDER — DICYCLOMINE HCL 10 MG/ML IM SOLN
20.0000 mg | Freq: Once | INTRAMUSCULAR | Status: AC
  Administered 2020-05-27: 20 mg via INTRAMUSCULAR
  Filled 2020-05-27: qty 2

## 2020-05-27 MED ORDER — ONDANSETRON HCL 4 MG PO TABS: 4.0000 mg | ORAL_TABLET | Freq: Three times a day (TID) | ORAL | 0 refills | Status: DC | PRN

## 2020-05-27 MED ORDER — ONDANSETRON 8 MG PO TBDP
8.0000 mg | ORAL_TABLET | Freq: Once | ORAL | Status: AC
  Administered 2020-05-27: 8 mg via ORAL
  Filled 2020-05-27: qty 1

## 2020-05-27 MED ORDER — LIDOCAINE VISCOUS HCL 2 % MT SOLN
15.0000 mL | Freq: Once | OROMUCOSAL | Status: AC
  Administered 2020-05-27: 15 mL via ORAL
  Filled 2020-05-27: qty 15

## 2020-05-27 MED ORDER — ALUM & MAG HYDROXIDE-SIMETH 200-200-20 MG/5ML PO SUSP
30.0000 mL | Freq: Once | ORAL | Status: AC
  Administered 2020-05-27: 30 mL via ORAL
  Filled 2020-05-27: qty 30

## 2020-05-27 MED ORDER — OMEPRAZOLE 20 MG PO CPDR: 20.0000 mg | DELAYED_RELEASE_CAPSULE | Freq: Every day | ORAL | 0 refills | Status: DC

## 2020-05-27 MED ORDER — DICYCLOMINE HCL 20 MG PO TABS: 20.0000 mg | ORAL_TABLET | Freq: Two times a day (BID) | ORAL | 0 refills | Status: DC | PRN

## 2020-05-27 MED ORDER — SUCRALFATE 1 G PO TABS
1.0000 g | ORAL_TABLET | Freq: Once | ORAL | Status: AC
  Administered 2020-05-27: 1 g via ORAL
  Filled 2020-05-27: qty 1

## 2020-05-27 NOTE — ED Provider Notes (Signed)
[facility] DEPT Provider Note   CSN: [id number] Arrival date & time: 05/27/20  1942     History Chief Complaint  Patient presents with  . Abdominal Pain    Raymond Mcclure is a 55 y.o. male.  HPI   Patient with significant medical history of migraines, obesity, presents with chief complaint of abdominal pain.  Patient states pain started this morning, describes it as a dull achy-like sensation in his entire abdomen, does not radiate, associated with nausea without vomiting, he had constipation but this is since resolved after he had MiraLAX.  Patient states he now has diarrhea denies seeing any dark tarry stools or blood in his stool.  Denies urinary symptoms.  Patient had a inguinal hernia repair, has no history of bowel obstruction, liver or gallbladder abnormalities, stomach ulcers, diverticulitis, pancreatitis.  Patient also notes that he has a red mark on his abdomen, says it is slightly tender to palpation, is not itchy, he has no diffuse rashes on him, denies tongue or throat swelling.  Patient never experienced in the past, he denies any relieving factors.  Patient denies headaches, fevers, chills, shortness of breath, chest pain, vomiting, urinary symptoms, worsening pedal edema.  Past Medical History:  Diagnosis Date  . Cluster headaches   . Drug abuse ([place])   . Migraines   . Obesity   . Tachycardia     There are no problems to display for this patient.   Past Surgical History:  Procedure Laterality Date  . LOOP RECORDER IMPLANT    . WRIST SURGERY     nerve repair       No family history on file.  Social History   Tobacco Use  . Smoking status: Current Some Day Smoker    Packs/day: 0.15    Types: Cigarettes  . Smokeless tobacco: Never Used  Substance Use Topics  . Alcohol use: No  . Drug use: Not Currently    Home Medications Prior to Admission medications   Medication Sig Start Date End Date Taking? Authorizing Provider   dicyclomine (BENTYL) 20 MG tablet Take 1 tablet (20 mg total) by mouth 2 (two) times daily as needed for spasms. 05/27/20  Yes Marcello Fennel, PA-C  omeprazole (PRILOSEC) 20 MG capsule Take 1 capsule (20 mg total) by mouth daily. 05/27/20 06/26/20 Yes Marcello Fennel, PA-C  ondansetron (ZOFRAN) 4 MG tablet Take 1 tablet (4 mg total) by mouth every 8 (eight) hours as needed for nausea or vomiting. 05/27/20  Yes Marcello Fennel, PA-C  acetaminophen (TYLENOL) 500 MG tablet Take 2,000 mg by mouth every 4 (four) hours as needed for moderate pain.    [provider]    Allergies    Bee venom and Peanut oil  Review of Systems   Review of Systems  Constitutional: Negative for chills and fever.  HENT: Negative for congestion and sore throat.   Respiratory: Negative for shortness of breath.   Cardiovascular: Negative for chest pain.  Gastrointestinal: Positive for abdominal pain, constipation, diarrhea and nausea. Negative for blood in stool and vomiting.  Genitourinary: Positive for frequency. Negative for difficulty urinating and enuresis.  Musculoskeletal: Negative for back pain.  Skin: Positive for rash.  Neurological: Negative for dizziness and headaches.  Hematological: Does not bruise/bleed easily.    Physical Exam Updated Vital Signs BP (!) 147/91   Pulse 78   Temp 98.4 F (36.9 C) (Oral)   Resp 16   Wt 116.1 kg   SpO2  100%   BMI 36.73 kg/m   Physical Exam Vitals and nursing note reviewed.  Constitutional:      General: He is not in acute distress.    Appearance: He is not ill-appearing.  HENT:     Head: Normocephalic and atraumatic.     Nose: No congestion.  Eyes:     Conjunctiva/sclera: Conjunctivae normal.  Cardiovascular:     Rate and Rhythm: Normal rate and regular rhythm.     Pulses: Normal pulses.     Heart sounds: No murmur heard. No friction rub. No gallop.   Pulmonary:     Effort: No respiratory distress.     Breath sounds: No wheezing,  rhonchi or rales.  Abdominal:     General: There is no distension.     Palpations: Abdomen is soft.     Tenderness: There is abdominal tenderness. There is no right CVA tenderness or left CVA tenderness.     Comments: Abdomen is nondistended, normative bowel sounds, dull to percussion.  Patient had slight tenderness to palpation on his right lower quadrant, negative McBurney point, Murphy sign, rebound tenderness or peritoneal sign.  Patient no CVA tenderness.  Musculoskeletal:     Right lower leg: No edema.     Left lower leg: No edema.  Skin:    General: Skin is warm and dry.     Findings: Rash present.  Neurological:     Mental Status: He is alert.  Psychiatric:        Mood and Affect: Mood normal.     ED Results / Procedures / Treatments   Labs (all labs ordered are listed, but only abnormal results are displayed) Labs Reviewed  COMPREHENSIVE METABOLIC PANEL - Abnormal; Notable for the following components:      Result Value   CO2 20 (*)    All other components within normal limits  LIPASE, BLOOD  CBC  URINALYSIS, ROUTINE W REFLEX MICROSCOPIC    EKG None  Radiology No results found.  Procedures Procedures   Medications Ordered in ED Medications  dicyclomine (BENTYL) injection 20 mg (20 mg Intramuscular Given 05/27/20 2134)  ondansetron (ZOFRAN-ODT) disintegrating tablet 8 mg (8 mg Oral Given 05/27/20 2134)  alum & mag hydroxide-simeth (MAALOX/MYLANTA) 200-200-20 MG/5ML suspension 30 mL (30 mLs Oral Given 05/27/20 2134)    And  lidocaine (XYLOCAINE) 2 % viscous mouth solution 15 mL (15 mLs Oral Given 05/27/20 2134)  sucralfate (CARAFATE) tablet 1 g (1 g Oral Given 05/27/20 2134)    ED Course  I have reviewed the triage vital signs and the nursing notes.  Pertinent labs & imaging results that were available during my care of the patient were reviewed by me and considered in my medical decision making (see chart for details).    MDM Rules/Calculators/A&P                          Initial impression-patient presents with generalized abdominal pain.  He is alert, does not appear in acute stress, vital signs reassuring.  Will obtain basic lab work, provide patient with GI cocktail, Carafate, Bentyl and reassess.  Work-up-CBC unremarkable, CMP shows decreased CO2 of 20, lipase 33, UA negative for nitrates, exudates, hematuria.  Reassessment patient was reassessed after providing him with GI cocktail, states she feels much better, says he has slight pain but has much improved.  Updated patient on lab work patient agreed for discharge at this time.  Rule out-low suspicion for systemic infection  as patient is nontoxic-appearing, vital signs reassuring, no obvious source of infection on my exam.  Low suspicion for liver or gallbladder abnormality as patient has no right upper quadrant tenderness, liver enzymes and alk phos within normal limits.  Low suspicion for perforated stomach ulcer as abdomen is nondistended, dull to percussion, no peritoneal signs on my exam.  Low suspicion for diverticulitis as patient has no history of this, no rebound tenderness, no leukocytosis seen on CBC.  Low suspicion for UTI, pyelonephritis, kidney stone as UA negative for infection and hematuria.  Plan- General abdominal pain since resolved suspect secondary due to gastritis versus GERD.  Will start patient on a PPI, provide with Bentyl, follow-up with PCP for further evaluation.  Vital signs have remained stable, no indication for hospital admission. Patient given at home care as well strict return precautions.  Patient verbalized that they understood agreed to said plan.   Final Clinical Impression(s) / ED Diagnoses Final diagnoses:  Generalized abdominal pain    Rx / DC Orders ED Discharge Orders         Ordered    ondansetron (ZOFRAN) 4 MG tablet  Every 8 hours PRN        05/27/20 2236    dicyclomine (BENTYL) 20 MG tablet  2 times daily PRN        05/27/20 2236     omeprazole (PRILOSEC) 20 MG capsule  Daily        05/27/20 2236           Marcello Fennel, PA-C 05/27/20 2237    Drenda Freeze, MD 05/27/20 [phone]

## 2020-05-27 NOTE — Discharge Instructions (Addendum)
Lab work and exam all look reassuring.  I have given you a prescription for an acid pill please take as prescribed.  Also given you a prescription called Bentyl please use as needed for abdominal pain.  Also given you a prescription for Zofran this will help with nausea please use as needed.  Like to follow-up with your PCP for further evaluation.  Come back to the emergency department if you develop chest pain, shortness of breath, severe abdominal pain, uncontrolled nausea, vomiting, diarrhea.

## 2020-05-27 NOTE — ED Triage Notes (Signed)
Pt reports abdominal fullness, abdominal pain, constipation and nausea.  Pt reports that he took magnesium citrate and had a BM but this did not relieve his symptoms.  Pt also reports some red spots on the skin of his stomach that hurt and his left eye is red and itchy.

## 2020-06-17 ENCOUNTER — Ambulatory Visit (HOSPITAL_COMMUNITY): Admission: EM | Admit: 2020-06-17 | Discharge: 2020-06-17 | Discharge status: Against Medical Advice | Payer: Self-pay

## 2020-06-17 ENCOUNTER — Other Ambulatory Visit: Payer: Self-pay

## 2020-08-13 ENCOUNTER — Emergency Department (HOSPITAL_COMMUNITY): Payer: Self-pay

## 2020-08-13 ENCOUNTER — Encounter (HOSPITAL_COMMUNITY): Payer: Self-pay

## 2020-08-13 ENCOUNTER — Emergency Department (HOSPITAL_COMMUNITY)
Admission: EM | Admit: 2020-08-13 | Discharge: 2020-08-13 | Disposition: A | Discharge status: Home / Self Care | Payer: Self-pay | Attending: Emergency Medicine | Admitting: Emergency Medicine

## 2020-08-13 ENCOUNTER — Other Ambulatory Visit: Payer: Self-pay

## 2020-08-13 DIAGNOSIS — R1031 Right lower quadrant pain: Secondary | ICD-10-CM

## 2020-08-13 DIAGNOSIS — F17210 Nicotine dependence, cigarettes, uncomplicated: Secondary | ICD-10-CM | POA: Insufficient documentation

## 2020-08-13 DIAGNOSIS — K654 Sclerosing mesenteritis: Secondary | ICD-10-CM

## 2020-08-13 DIAGNOSIS — Z91010 Allergy to peanuts: Secondary | ICD-10-CM | POA: Insufficient documentation

## 2020-08-13 LAB — COMPREHENSIVE METABOLIC PANEL
ALT: 29 U/L (ref 0–44)
AST: 41 U/L (ref 15–41)
Albumin: 4.3 g/dL (ref 3.5–5.0)
Alkaline Phosphatase: 94 U/L (ref 38–126)
Anion gap: 9 (ref 5–15)
BUN: 22 mg/dL — ABNORMAL HIGH (ref 6–20)
CO2: 24 mmol/L (ref 22–32)
Calcium: 9.4 mg/dL (ref 8.9–10.3)
Chloride: 106 mmol/L (ref 98–111)
Creatinine, Ser: 1.06 mg/dL (ref 0.61–1.24)
GFR, Estimated: >60 mL/min (ref >60)
Glucose, Bld: 96 mg/dL (ref 70–99)
Potassium: 4.2 mmol/L (ref 3.5–5.1)
Sodium: 139 mmol/L (ref 135–145)
Total Bilirubin: 1.1 mg/dL (ref 0.3–1.2)
Total Protein: 7.8 g/dL (ref 6.5–8.1)

## 2020-08-13 LAB — CBC WITH DIFFERENTIAL/PLATELET
Abs Immature Granulocytes: 0.03 10*3/uL (ref 0.00–0.07)
Basophils Absolute: 0.1 10*3/uL (ref 0.0–0.1)
Basophils Relative: 1 %
Eosinophils Absolute: 0.3 10*3/uL (ref 0.0–0.5)
Eosinophils Relative: 2 %
HCT: 44.8 % (ref 39.0–52.0)
Hemoglobin: 15.2 g/dL (ref 13.0–17.0)
Immature Granulocytes: 0 %
Lymphocytes Relative: 37 %
Lymphs Abs: 3.9 10*3/uL (ref 0.7–4.0)
MCH: 31.6 pg (ref 26.0–34.0)
MCHC: 33.9 g/dL (ref 30.0–36.0)
MCV: 93.1 fL (ref 80.0–100.0)
Monocytes Absolute: 0.8 10*3/uL (ref 0.1–1.0)
Monocytes Relative: 8 %
Neutro Abs: 5.6 10*3/uL (ref 1.7–7.7)
Neutrophils Relative %: 52 %
Platelets: 309 10*3/uL (ref 150–400)
RBC: 4.81 MIL/uL (ref 4.22–5.81)
RDW: 12.6 % (ref 11.5–15.5)
WBC: 10.7 10*3/uL — ABNORMAL HIGH (ref 4.0–10.5)
nRBC: 0 % (ref 0.0–0.2)

## 2020-08-13 LAB — URINALYSIS, ROUTINE W REFLEX MICROSCOPIC
Bilirubin Urine: NEGATIVE
Glucose, UA: NEGATIVE mg/dL
Hgb urine dipstick: NEGATIVE
Ketones, ur: NEGATIVE mg/dL
Leukocytes,Ua: NEGATIVE
Nitrite: NEGATIVE
Protein, ur: NEGATIVE mg/dL
Specific Gravity, Urine: >1.046 — ABNORMAL HIGH (ref 1.005–1.030)
pH: 5 (ref 5.0–8.0)

## 2020-08-13 LAB — LIPASE, BLOOD: Lipase: 32 U/L (ref 11–51)

## 2020-08-13 MED ORDER — NAPROXEN 500 MG PO TABS: 500.0000 mg | ORAL_TABLET | Freq: Two times a day (BID) | ORAL | 0 refills | Status: DC

## 2020-08-13 MED ORDER — MORPHINE SULFATE (PF) 4 MG/ML IV SOLN
4.0000 mg | Freq: Once | INTRAVENOUS | Status: AC
  Administered 2020-08-13: 4 mg via INTRAVENOUS
  Filled 2020-08-13: qty 1

## 2020-08-13 MED ORDER — IOHEXOL 350 MG/ML SOLN
80.0000 mL | Freq: Once | INTRAVENOUS | Status: AC | PRN
  Administered 2020-08-13: 80 mL via INTRAVENOUS

## 2020-08-13 MED ORDER — SODIUM CHLORIDE (PF) 0.9 % IJ SOLN
INTRAMUSCULAR | Status: AC
  Filled 2020-08-13: qty 50

## 2020-08-13 NOTE — ED Provider Notes (Signed)
[facility]   CSN: [id number] Arrival date & time: 08/13/20  1602     History Chief Complaint  Patient presents with   Groin Pain    Raymond Mcclure is a 55 y.o. male who presents to the ED today with complaint of gradual onset, constant, achy, RLQ/right groin pain for the past 2 weeks. Pt also reports decreased appetite due to pain. He reports hx of mesh hernia repair in this area several years ago in [place]. Pt states he has not tried to follow up with his surgeon because he lives here now and has no way of getting to [place]. He has been taking Ibuprofen and Tylenol for pain without relief. Pt is concerned it could be his hernia causing pain. He is still passing gas without issue and having regular bowel movement. He denies specific pain to his testicle. Denies fever, chills, vomiting, or any other associated symptoms.   The history is provided by the patient and medical records.      Past Medical History:  Diagnosis Date   Cluster headaches    Drug abuse (HCC)    Migraines    Obesity    Tachycardia     There are no problems to display for this patient.   Past Surgical History:  Procedure Laterality Date   LOOP RECORDER IMPLANT     WRIST SURGERY     nerve repair       History reviewed. No pertinent family history.  Social History   Tobacco Use   Smoking status: Some Days    Packs/day: 0.15    Pack years: 0.00    Types: Cigarettes   Smokeless tobacco: Never  Substance Use Topics   Alcohol use: No   Drug use: Not Currently    Home Medications Prior to Admission medications   Medication Sig Start Date End Date Taking? Authorizing Provider  naproxen (NAPROSYN) 500 MG tablet Take 1 tablet (500 mg total) by mouth 2 (two) times daily. 08/13/20  Yes Abbagayle Zaragoza, PA-C  acetaminophen (TYLENOL) 500 MG tablet Take 2,000 mg by mouth every 4 (four) hours as needed for moderate pain.    [provider]   dicyclomine (BENTYL) 20 MG tablet Take 1 tablet (20 mg total) by mouth 2 (two) times daily as needed for spasms. 05/27/20   Carroll Sage, PA-C  omeprazole (PRILOSEC) 20 MG capsule Take 1 capsule (20 mg total) by mouth daily. 05/27/20 06/26/20  Carroll Sage, PA-C  ondansetron (ZOFRAN) 4 MG tablet Take 1 tablet (4 mg total) by mouth every 8 (eight) hours as needed for nausea or vomiting. 05/27/20   Carroll Sage, PA-C    Allergies    Bee venom, Peanut oil, and Peanut-containing drug products  Review of Systems   Review of Systems  Constitutional:  Positive for appetite change. Negative for chills and fever.  Cardiovascular:  Negative for chest pain.  Gastrointestinal:  Positive for abdominal pain and nausea. Negative for constipation, diarrhea and vomiting.  All other systems reviewed and are negative.  Physical Exam Updated Vital Signs BP (!) 149/100 (BP Location: Right Arm)   Pulse 98   Temp 98.2 F (36.8 C) (Oral)   Resp 20   Ht 5\' 10"  (1.778 m)   Wt 116 kg   BMI 36.69 kg/m   Physical Exam Vitals and nursing note reviewed.  Constitutional:      Appearance: He is obese. He is not ill-appearing or diaphoretic.  HENT:  Head: Normocephalic and atraumatic.  Eyes:     Conjunctiva/sclera: Conjunctivae normal.  Cardiovascular:     Rate and Rhythm: Normal rate and regular rhythm.  Pulmonary:     Effort: Pulmonary effort is normal.     Breath sounds: Normal breath sounds. No wheezing, rhonchi or rales.  Abdominal:     Palpations: Abdomen is soft.     Tenderness: There is abdominal tenderness. There is no guarding or rebound.     Comments: + right inguinal TTP. No hernia appreciated. No overlying skin changes.   Genitourinary:    Comments: Chaperone present for GU exam. No swelling or TTP to right testicle. Normal lie.  Musculoskeletal:     Cervical back: Neck supple.  Skin:    General: Skin is warm and dry.  Neurological:     Mental Status: He is alert.     ED Results / Procedures / Treatments   Labs (all labs ordered are listed, but only abnormal results are displayed) Labs Reviewed  COMPREHENSIVE METABOLIC PANEL - Abnormal; Notable for the following components:      Result Value   BUN 22 (*)    All other components within normal limits  CBC WITH DIFFERENTIAL/PLATELET - Abnormal; Notable for the following components:   WBC 10.7 (*)    All other components within normal limits  LIPASE, BLOOD  URINALYSIS, ROUTINE W REFLEX MICROSCOPIC    EKG None  Radiology CT Abdomen Pelvis W Contrast  Result Date: 08/13/2020 CLINICAL DATA:  Right groin pain, hernia suspected. EXAM: CT ABDOMEN AND PELVIS WITH CONTRAST TECHNIQUE: Multidetector CT imaging of the abdomen and pelvis was performed using the standard protocol following bolus administration of intravenous contrast. CONTRAST:  53mL OMNIPAQUE IOHEXOL 350 MG/ML SOLN COMPARISON:  CT April 12, 2006. FINDINGS: Lower chest: Right lower lobe atelectasis. Hepatobiliary: Hypodense 8 mm lesion right lobe liver on image 19/2, technically too small to accurately characterize but statistically likely to represent a benign cyst or hemangioma. Gallbladder is unremarkable. No biliary ductal dilation. Pancreas: Within normal limits. Spleen: Within normal limits. Adrenals/Urinary Tract: Bilateral adrenal glands are unremarkable. No hydronephrosis. There are bilateral renal cysts the largest of which measures 2.6 cm in the upper pole the right kidney. Urinary bladder is grossly unremarkable for degree of distension. Stomach/Bowel: Stomach is within normal limits. Appendix is not definitely visualized however there is no pericecal inflammation. No evidence of bowel wall thickening, distention, or inflammatory changes. Colonic diverticulosis without findings of acute diverticulitis. Vascular/Lymphatic: No abdominal aortic aneurysm. No pathologically enlarged abdominal or pelvic lymph nodes. Misty appearance of the small  bowel mesentery with prominent mesenteric lymph nodes measuring up to 5 mm. Reproductive: Prostate is unremarkable. Other: No abdominal wall hernia or abnormality. No abdominopelvic ascites. Musculoskeletal: No acute or significant osseous findings. IMPRESSION: 1. No acute findings in the abdomen or pelvis. 2. No evidence of inguinal hernia. 3. Colonic diverticulosis without findings of acute diverticulitis. 4. 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 Electronically Signed   By: Maudry Mayhew MD   On: 08/13/2020 18:51    Procedures Procedures   Medications Ordered in ED Medications  sodium chloride (PF) 0.9 % injection (has no administration in time range)  morphine 4 MG/ML injection 4 mg (4 mg Intravenous Given 08/13/20 1711)  iohexol (OMNIPAQUE) 350 MG/ML injection 80 mL (80 mLs Intravenous Contrast Given 08/13/20 1813)    ED Course  I have reviewed  the triage vital signs and the nursing notes.  Pertinent labs & imaging results that were available during my care of the patient were reviewed by me and considered in my medical decision making (see chart for details).    MDM Rules/Calculators/A&P                          55 year old male who presents to the ED today with complaint of right lower quadrant/right groin pain for the past 2 weeks with history of hernia mesh repair in the same area a couple of years ago.  On arrival to the ED today vitals are stable patient appears to be in no acute distress.  Chaperone present for exam, does not extend into the testicular region at this time.  Positive right inguinal as well as right lower quadrant abdominal tenderness palpation without any obvious hernia appreciated.  No overlying skin changes.  We will plan for labs at this time and likely CT abdomen and pelvis given area of pain.  CBC without leukocytosis. Hgb stable at  15.2 CMP without electrolyte abnormalities. LFTs unremarkable Lipase 32  CT: IMPRESSION:  1. No acute findings in the abdomen or pelvis.  2. No evidence of inguinal hernia.  3. Colonic diverticulosis without findings of acute diverticulitis.  4. Misty appearance of the small bowel mesentery with prominent  mesenteric lymph nodes measuring up to 5 mm, nonspecific but  possibly representing mesenteric panniculitis.      Question mesenteric panniculitis causing pain. Will have pt follow up with his PCP for same. Will provide antiinflammatory for pain relief. Pt stable for discharge.   This note was prepared using Dragon voice recognition software and may include unintentional dictation errors due to the inherent limitations of voice recognition software.   Final Clinical Impression(s) / ED Diagnoses Final diagnoses:  RLQ abdominal pain  Right inguinal pain  Mesenteric panniculitis (HCC)    Rx / DC Orders ED Discharge Orders          Ordered    naproxen (NAPROSYN) 500 MG tablet  2 times daily        08/13/20 1929             Discharge Instructions      Your workup was overall reassuring in the ED today. Your CT scan did not show any hernias. It did show some inflammation of the lymph nodes in your abdomen that could be causing your pain.   Please pick up medication and take as needed for pain.   Please follow up with your PCP regarding ED visit today. If you do not have a PCP you can follow up with [facility] for primary care needs.   Return to the ED for any new/worsening symptoms         Tanda Rockers, Cordelia Poche 08/13/20 1930    Koleen Distance, MD 08/13/20 [phone]

## 2020-08-13 NOTE — ED Triage Notes (Signed)
Pt states pain to right groin with decreased appetite.

## 2020-08-13 NOTE — Discharge Instructions (Addendum)
Your workup was overall reassuring in the ED today. Your CT scan did not show any hernias. It did show some inflammation of the lymph nodes in your abdomen that could be causing your pain.   Please pick up medication and take as needed for pain.   Please follow up with your PCP regarding ED visit today. If you do not have a PCP you can follow up with [facility] for primary care needs.   Return to the ED for any new/worsening symptoms

## 2020-10-02 ENCOUNTER — Emergency Department (HOSPITAL_COMMUNITY)
Admission: EM | Admit: 2020-10-02 | Discharge: 2020-10-02 | Disposition: A | Discharge status: Home / Self Care | Payer: Self-pay | Attending: Emergency Medicine | Admitting: Emergency Medicine

## 2020-10-02 ENCOUNTER — Encounter (HOSPITAL_COMMUNITY): Payer: Self-pay | Admitting: Emergency Medicine

## 2020-10-02 DIAGNOSIS — F17210 Nicotine dependence, cigarettes, uncomplicated: Secondary | ICD-10-CM | POA: Insufficient documentation

## 2020-10-02 DIAGNOSIS — N342 Other urethritis: Secondary | ICD-10-CM

## 2020-10-02 DIAGNOSIS — Z91010 Allergy to peanuts: Secondary | ICD-10-CM | POA: Insufficient documentation

## 2020-10-02 DIAGNOSIS — N341 Nonspecific urethritis: Secondary | ICD-10-CM | POA: Insufficient documentation

## 2020-10-02 LAB — URINALYSIS, ROUTINE W REFLEX MICROSCOPIC
Bacteria, UA: NONE SEEN
Bilirubin Urine: NEGATIVE
Glucose, UA: NEGATIVE mg/dL
Hgb urine dipstick: NEGATIVE
Leukocytes,Ua: NEGATIVE
Nitrite: NEGATIVE
Protein, ur: NEGATIVE mg/dL
Specific Gravity, Urine: >1.03 — ABNORMAL HIGH (ref 1.005–1.030)
pH: 6 (ref 5.0–8.0)

## 2020-10-02 MED ORDER — LIDOCAINE HCL 1 % IJ SOLN
INTRAMUSCULAR | Status: AC
  Administered 2020-10-02: 2.1 mL
  Filled 2020-10-02: qty 20

## 2020-10-02 MED ORDER — CEFTRIAXONE SODIUM 1 G IJ SOLR
500.0000 mg | Freq: Once | INTRAMUSCULAR | Status: AC
  Administered 2020-10-02: 500 mg via INTRAMUSCULAR
  Filled 2020-10-02: qty 10

## 2020-10-02 MED ORDER — DOXYCYCLINE HYCLATE 100 MG PO CAPS: 100.0000 mg | ORAL_CAPSULE | Freq: Two times a day (BID) | ORAL | 0 refills | Status: DC

## 2020-10-02 NOTE — ED Provider Notes (Signed)
[facility]EMER[facility]ENC[facility]PT Provider Note   CSN: [id number] Arrival date & time: 10/02/20  1017     History Chief Complaint  Patient presents with   Dysuria    Raymond Mcclure is a 55 y.o. male.  The history is provided by the patient. No language interpreter was used.  Dysuria Presenting symptoms: dysuria   Context: not after intercourse   Relieved by:  Nothing Worsened by:  Nothing Ineffective treatments:  None tried Associated symptoms: no abdominal pain   Risk factors: recent sexual activity       Past Medical History:  Diagnosis Date   Cluster headaches    Drug abuse (HCC)    Migraines    Obesity    Tachycardia     There are no problems to display for this patient.   Past Surgical History:  Procedure Laterality Date   LOOP RECORDER IMPLANT     WRIST SURGERY     nerve repair       No family history on file.  Social History   Tobacco Use   Smoking status: Some Days    Packs/day: 0.15    Types: Cigarettes   Smokeless tobacco: Never  Substance Use Topics   Alcohol use: No   Drug use: Not Currently    Home Medications Prior to Admission medications   Medication Sig Start Date End Date Taking? Authorizing Provider  acetaminophen (TYLENOL) 500 MG tablet Take 2,000 mg by mouth every 4 (four) hours as needed for moderate pain.    [provider]  dicyclomine (BENTYL) 20 MG tablet Take 1 tablet (20 mg total) by mouth 2 (two) times daily as needed for spasms. 05/27/20   Carroll Sage, PA-C  naproxen (NAPROSYN) 500 MG tablet Take 1 tablet (500 mg total) by mouth 2 (two) times daily. 08/13/20   Tanda Rockers, PA-C  omeprazole (PRILOSEC) 20 MG capsule Take 1 capsule (20 mg total) by mouth daily. 05/27/20 06/26/20  Carroll Sage, PA-C  ondansetron (ZOFRAN) 4 MG tablet Take 1 tablet (4 mg total) by mouth every 8 (eight) hours as needed for nausea or vomiting. 05/27/20   Carroll Sage, PA-C    Allergies    Bee  venom, Peanut oil, and Peanut-containing drug products  Review of Systems   Review of Systems  Gastrointestinal:  Negative for abdominal pain.  Genitourinary:  Positive for dysuria.  All other systems reviewed and are negative.  Physical Exam Updated Vital Signs BP (!) 145/104   Pulse 98   Temp 97.9 F (36.6 C) (Oral)   Resp 18   SpO2 97%   Physical Exam Vitals and nursing note reviewed.  Constitutional:      Appearance: He is well-developed.  HENT:     Head: Normocephalic.  Pulmonary:     Effort: Pulmonary effort is normal.  Abdominal:     General: There is no distension.  Musculoskeletal:        General: Normal range of motion.     Cervical back: Normal range of motion.  Neurological:     Mental Status: He is alert and oriented to person, place, and time.    ED Results / Procedures / Treatments   Labs (all labs ordered are listed, but only abnormal results are displayed) Labs Reviewed  URINALYSIS, ROUTINE W REFLEX MICROSCOPIC - Abnormal; Notable for the following components:      Result Value   Color, Urine YELLOW (*)    APPearance CLEAR (*)  Specific Gravity, Urine >1.030 (*)    Ketones, ur TRACE (*)    All other components within normal limits  GC/CHLAMYDIA PROBE AMP ([place]) NOT AT [facility]    EKG None  Radiology No results found.  Procedures Procedures   Medications Ordered in ED Medications - No data to display  ED Course  I have reviewed the triage vital signs and the nursing notes.  Pertinent labs & imaging results that were available during my care of the patient were reviewed by me and considered in my medical decision making (see chart for details).  Clinical Course as of 10/02/20 1234  Thu Oct 02, 2020  1103 GC/Chlamydia probe amp [facility]) not at [facility] [AW]    Clinical Course User Index [AW] Koleen Distance, MD   MDM Rules/Calculators/A&P                           MDM:  Pt request treatment for std.  Final Clinical  Impression(s) / ED Diagnoses Final diagnoses:  Urethritis    Rx / DC Orders ED Discharge Orders          Ordered    doxycycline (VIBRAMYCIN) 100 MG capsule  2 times daily        10/02/20 1236          An After Visit Summary was printed and given to the patient.    Osie Cheeks 10/02/20 1237    Koleen Distance, MD 10/02/20 [phone]

## 2020-10-02 NOTE — ED Triage Notes (Signed)
Patient c/o dysuria x3 weeks and penile discharge x1 week. Concerned for STD.

## 2020-10-03 ENCOUNTER — Telehealth (HOSPITAL_COMMUNITY): Payer: Self-pay | Admitting: Emergency Medicine

## 2020-10-03 MED ORDER — DOXYCYCLINE HYCLATE 100 MG PO CAPS: 100.0000 mg | ORAL_CAPSULE | Freq: Two times a day (BID) | ORAL | 0 refills | Status: DC

## 2020-10-03 NOTE — Telephone Encounter (Signed)
Received call from patient that he did not receive his antibiotic, pharmacy did not have antibiotic available or active prescription.  He request it to be sent to a different pharmacy. [facility] on [address], doxycycline.  This has been completed.

## 2020-11-15 ENCOUNTER — Encounter (HOSPITAL_COMMUNITY): Payer: Self-pay

## 2020-11-15 ENCOUNTER — Other Ambulatory Visit: Payer: Self-pay

## 2020-11-15 ENCOUNTER — Emergency Department (HOSPITAL_COMMUNITY)
Admission: EM | Admit: 2020-11-15 | Discharge: 2020-11-15 | Disposition: A | Discharge status: Home / Self Care | Payer: Self-pay | Attending: Emergency Medicine | Admitting: Emergency Medicine

## 2020-11-15 DIAGNOSIS — F17210 Nicotine dependence, cigarettes, uncomplicated: Secondary | ICD-10-CM | POA: Insufficient documentation

## 2020-11-15 DIAGNOSIS — G43909 Migraine, unspecified, not intractable, without status migrainosus: Secondary | ICD-10-CM | POA: Diagnosis not present

## 2020-11-15 DIAGNOSIS — Z91010 Allergy to peanuts: Secondary | ICD-10-CM | POA: Insufficient documentation

## 2020-11-15 MED ORDER — PROCHLORPERAZINE EDISYLATE 10 MG/2ML IJ SOLN
10.0000 mg | Freq: Once | INTRAMUSCULAR | Status: AC
  Administered 2020-11-15: 10 mg via INTRAVENOUS
  Filled 2020-11-15: qty 2

## 2020-11-15 MED ORDER — SODIUM CHLORIDE 0.9 % IV BOLUS
1000.0000 mL | Freq: Once | INTRAVENOUS | Status: AC
  Administered 2020-11-15: 1000 mL via INTRAVENOUS

## 2020-11-15 MED ORDER — KETOROLAC TROMETHAMINE 30 MG/ML IJ SOLN
30.0000 mg | Freq: Once | INTRAMUSCULAR | Status: AC
  Administered 2020-11-15: 30 mg via INTRAVENOUS
  Filled 2020-11-15: qty 1

## 2020-11-15 MED ORDER — DIPHENHYDRAMINE HCL 50 MG/ML IJ SOLN
25.0000 mg | Freq: Once | INTRAMUSCULAR | Status: AC
  Administered 2020-11-15: 25 mg via INTRAVENOUS
  Filled 2020-11-15: qty 1

## 2020-11-15 NOTE — ED Provider Notes (Signed)
[facility]EMERGENC[facility]PT Provider Note   CSN: [id number] Arrival date & time: 11/15/20  8502     History Chief Complaint  Patient presents with   Migraine    Raymond Mcclure is a 55 y.o. male.  Raymond Mcclure is a 55 y.o. male with a history of migraines, cluster headaches and tachycardia, who presents to the emergency department complaining of migraine headache.  Patient reports he woke up with headache this morning.  It is present over the left side of his head and is described as a constant dull ache.  He reports is associated with light sensitivity.  He denies any other visual changes.  Patient reports some associated nausea but no vomiting.  No numbness, tingling or weakness in extremities.  No lightheadedness or dizziness.  No associated fevers, neck pain or stiffness.  He reports this feels exactly like his usual migraine.  He has been seen at emergency departments for similar symptoms multiple times has had prior head imaging that has been reassuring.  Reports symptoms typically respond to migraine cocktail.  The history is provided by the patient and medical records.      Past Medical History:  Diagnosis Date   Cluster headaches    Drug abuse (HCC)    Migraines    Obesity    Tachycardia     There are no problems to display for this patient.   Past Surgical History:  Procedure Laterality Date   LOOP RECORDER IMPLANT     WRIST SURGERY     nerve repair       History reviewed. No pertinent family history.  Social History   Tobacco Use   Smoking status: Some Days    Packs/day: 0.15    Types: Cigarettes   Smokeless tobacco: Never  Substance Use Topics   Alcohol use: No   Drug use: Not Currently    Home Medications Prior to Admission medications   Medication Sig Start Date End Date Taking? Authorizing Provider  acetaminophen (TYLENOL) 500 MG tablet Take 2,000 mg by mouth every 4 (four) hours as needed for moderate pain.    [provider]  dicyclomine (BENTYL) 20 MG tablet Take 1 tablet (20 mg total) by mouth 2 (two) times daily as needed for spasms. 05/27/20   Carroll Sage, PA-C  doxycycline (VIBRAMYCIN) 100 MG capsule Take 1 capsule (100 mg total) by mouth 2 (two) times daily. 10/03/20   Sloan Leiter, DO  naproxen (NAPROSYN) 500 MG tablet Take 1 tablet (500 mg total) by mouth 2 (two) times daily. 08/13/20   Tanda Rockers, PA-C  omeprazole (PRILOSEC) 20 MG capsule Take 1 capsule (20 mg total) by mouth daily. 05/27/20 06/26/20  Carroll Sage, PA-C  ondansetron (ZOFRAN) 4 MG tablet Take 1 tablet (4 mg total) by mouth every 8 (eight) hours as needed for nausea or vomiting. 05/27/20   Carroll Sage, PA-C    Allergies    Bee venom, Peanut oil, and Peanut-containing drug products  Review of Systems   Review of Systems  Constitutional:  Negative for chills and fever.  HENT: Negative.    Eyes:  Positive for photophobia. Negative for pain and visual disturbance.  Respiratory:  Negative for shortness of breath.   Cardiovascular:  Negative for chest pain.  Gastrointestinal:  Positive for nausea. Negative for abdominal pain and vomiting.  Genitourinary:  Negative for dysuria.  Musculoskeletal:  Negative for myalgias, neck pain and neck stiffness.  Skin:  Negative for  color change and rash.  Neurological:  Positive for headaches. Negative for dizziness, syncope, facial asymmetry, speech difficulty, weakness, light-headedness and numbness.  All other systems reviewed and are negative.  Physical Exam Updated Vital Signs BP (!) 133/96   Pulse 64   Temp 97.7 F (36.5 C) (Oral)   Resp 14   SpO2 97%   Physical Exam Vitals and nursing note reviewed.  Constitutional:      General: He is not in acute distress.    Appearance: Normal appearance. He is well-developed. He is not ill-appearing or diaphoretic.     Comments: Well-appearing and in no distress   HENT:     Head: Normocephalic and atraumatic.      Mouth/Throat:     Mouth: Mucous membranes are moist.     Pharynx: Oropharynx is clear.  Eyes:     General:        Right eye: No discharge.        Left eye: No discharge.     Extraocular Movements: Extraocular movements intact.     Pupils: Pupils are equal, round, and reactive to light.  Cardiovascular:     Rate and Rhythm: Normal rate and regular rhythm.     Heart sounds: Normal heart sounds.  Pulmonary:     Effort: Pulmonary effort is normal. No respiratory distress.     Breath sounds: Normal breath sounds.  Musculoskeletal:        General: No deformity.     Cervical back: No rigidity.  Skin:    General: Skin is warm and dry.  Neurological:     Mental Status: He is alert and oriented to person, place, and time.     Coordination: Coordination normal.     Comments: Speech is clear, able to follow commands CN III-XII intact Normal strength in upper and lower extremities bilaterally including dorsiflexion and plantar flexion, strong and equal grip strength Sensation normal to light and sharp touch Moves extremities without ataxia, coordination intact No pronator drift  Psychiatric:        Mood and Affect: Mood normal.        Behavior: Behavior normal.    ED Results / Procedures / Treatments   Labs (all labs ordered are listed, but only abnormal results are displayed) Labs Reviewed - No data to display  EKG None  Radiology No results found.  Procedures Procedures   Medications Ordered in ED Medications  sodium chloride 0.9 % bolus 1,000 mL (1,000 mLs Intravenous New Bag/Given 11/15/20 0758)  ketorolac (TORADOL) 30 MG/ML injection 30 mg (30 mg Intravenous Given 11/15/20 0758)  prochlorperazine (COMPAZINE) injection 10 mg (10 mg Intravenous Given 11/15/20 0757)  diphenhydrAMINE (BENADRYL) injection 25 mg (25 mg Intravenous Given 11/15/20 0758)    ED Course  I have reviewed the triage vital signs and the nursing notes.  Pertinent labs & imaging results that  were available during my care of the patient were reviewed by me and considered in my medical decision making (see chart for details).    MDM Rules/Calculators/A&P                           Patient presents with complaint of headache. Patient is nontoxic appearing with stable vital signs. Patient has hx of similar headaches, gradual onset with steady progression in severity- non concerning for [facility], ICH, ischemic CVA, dural venous sinus thrombosis, acute glaucoma, giant cell arteritis, mass, or meningitis. Pt is afebrile with no focal neuro  deficits, dizziness, change in vision, proptosis, or nuchal rigidity. Patient treated for headache with IV headache cocktail with improvement. I discussed treatment plan, need for PCP follow-up, and return precautions with the patient. Provided opportunity for questions, patient confirmed understanding and is in agreement with plan.   Final Clinical Impression(s) / ED Diagnoses Final diagnoses:  Migraine without status migrainosus, not intractable, unspecified migraine type    Rx / DC Orders ED Discharge Orders     None        Dartha Lodge, [place] 11/15/20 8299    Terald Sleeper, MD 11/15/20 [phone]

## 2020-11-15 NOTE — ED Triage Notes (Signed)
Pt presents with c/o migraine. Pt reports hx of same. Pt reports he woke up this morning with the headache, also has light sensitivity.

## 2020-11-15 NOTE — Discharge Instructions (Signed)
I am glad you are headache has resolved.  Follow-up with your primary care doctor for continued management of migraines.  Return for new or worsening symptoms.

## 2021-01-05 ENCOUNTER — Emergency Department (HOSPITAL_COMMUNITY): Payer: Self-pay

## 2021-01-05 ENCOUNTER — Emergency Department (HOSPITAL_COMMUNITY)
Admission: EM | Admit: 2021-01-05 | Discharge: 2021-01-05 | Disposition: A | Discharge status: Home / Self Care | Payer: Self-pay | Attending: Emergency Medicine | Admitting: Emergency Medicine

## 2021-01-05 DIAGNOSIS — Z91010 Allergy to peanuts: Secondary | ICD-10-CM | POA: Insufficient documentation

## 2021-01-05 DIAGNOSIS — R42 Dizziness and giddiness: Secondary | ICD-10-CM | POA: Insufficient documentation

## 2021-01-05 DIAGNOSIS — R0789 Other chest pain: Secondary | ICD-10-CM | POA: Insufficient documentation

## 2021-01-05 DIAGNOSIS — R079 Chest pain, unspecified: Secondary | ICD-10-CM

## 2021-01-05 DIAGNOSIS — Z79899 Other long term (current) drug therapy: Secondary | ICD-10-CM | POA: Insufficient documentation

## 2021-01-05 DIAGNOSIS — R0602 Shortness of breath: Secondary | ICD-10-CM | POA: Insufficient documentation

## 2021-01-05 DIAGNOSIS — F17210 Nicotine dependence, cigarettes, uncomplicated: Secondary | ICD-10-CM | POA: Insufficient documentation

## 2021-01-05 DIAGNOSIS — R110 Nausea: Secondary | ICD-10-CM | POA: Insufficient documentation

## 2021-01-05 DIAGNOSIS — R61 Generalized hyperhidrosis: Secondary | ICD-10-CM | POA: Insufficient documentation

## 2021-01-05 LAB — BASIC METABOLIC PANEL
Anion gap: 8 (ref 5–15)
BUN: 14 mg/dL (ref 6–20)
CO2: 21 mmol/L — ABNORMAL LOW (ref 22–32)
Calcium: 9.2 mg/dL (ref 8.9–10.3)
Chloride: 109 mmol/L (ref 98–111)
Creatinine, Ser: 0.9 mg/dL (ref 0.61–1.24)
GFR, Estimated: >60 mL/min (ref >60)
Glucose, Bld: 97 mg/dL (ref 70–99)
Potassium: 4.1 mmol/L (ref 3.5–5.1)
Sodium: 138 mmol/L (ref 135–145)

## 2021-01-05 LAB — BRAIN NATRIURETIC PEPTIDE: B Natriuretic Peptide: 26.5 pg/mL (ref 0.0–100.0)

## 2021-01-05 LAB — CBC
HCT: 43.6 % (ref 39.0–52.0)
Hemoglobin: 14.8 g/dL (ref 13.0–17.0)
MCH: 32.1 pg (ref 26.0–34.0)
MCHC: 33.9 g/dL (ref 30.0–36.0)
MCV: 94.6 fL (ref 80.0–100.0)
Platelets: 335 10*3/uL (ref 150–400)
RBC: 4.61 MIL/uL (ref 4.22–5.81)
RDW: 12.5 % (ref 11.5–15.5)
WBC: 10.6 10*3/uL — ABNORMAL HIGH (ref 4.0–10.5)
nRBC: 0 % (ref 0.0–0.2)

## 2021-01-05 LAB — TROPONIN I (HIGH SENSITIVITY)
Troponin I (High Sensitivity): 5 ng/L (ref <18)
Troponin I (High Sensitivity): 4 ng/L (ref <18)

## 2021-01-05 NOTE — ED Triage Notes (Signed)
Pt from [facility] for sudden onset of generalized chest pain, feeling like he is being punched. Pain radiates down R side and has associated nausea and dizziness. Currently wearing heart monitor for tachycardia. EKG unremarkable per EMS. 324 ASA and 1 SL nitro given by EMS.

## 2021-01-05 NOTE — Discharge Instructions (Signed)
Please follow-up with cardiology in the next week for updated cardiac testing.  Your EKG, labs today did not indicate any signs of heart attack, but you do have risk factors and would benefit from an updated stress test and echo.  Return to the ED with any worsening chest pain, passing out, difficulty breathing.

## 2021-01-05 NOTE — ED Provider Notes (Signed)
[facility] EMERGENCY DEPARTMENT Provider Note   CSN: [id number] Arrival date & time: 01/05/21  1538     History Chief Complaint  Patient presents with   Chest Pain    Raymond Mcclure is a 55 y.o. male.   Chest Pain Associated symptoms: diaphoresis and dizziness   Associated symptoms: no abdominal pain, no back pain, no cough, no fever, no palpitations, no shortness of breath and no vomiting    HPI: A 55 year old patient with a history of obesity presents for evaluation of chest pain. Initial onset of pain was approximately 3-6 hours ago. The patient's chest pain is described as heaviness/pressure/tightness and is not worse with exertion. The patient complains of nausea. The patient's chest pain is middle- or left-sided, is not well-localized, is not sharp and does not radiate to the arms/jaw/neck. The patient denies diaphoresis. The patient has smoked in the past 90 days. The patient has no history of stroke, has no history of peripheral artery disease, denies any history of treated diabetes, has no relevant family history of coronary artery disease (first degree relative at less than age 9), is not hypertensive and has no history of hypercholesterolemia.   Past Medical History:  Diagnosis Date   Cluster headaches    Drug abuse ([place])    Migraines    Obesity    Tachycardia     There are no problems to display for this patient.   Past Surgical History:  Procedure Laterality Date   LOOP RECORDER IMPLANT     WRIST SURGERY     nerve repair       No family history on file.  Social History   Tobacco Use   Smoking status: Some Days    Packs/day: 0.15    Types: Cigarettes   Smokeless tobacco: Never  Substance Use Topics   Alcohol use: No   Drug use: Not Currently    Home Medications Prior to Admission medications   Medication Sig Start Date End Date Taking? Authorizing Provider  acetaminophen (TYLENOL) 500 MG tablet Take 2,000 mg by mouth every 4  (four) hours as needed for moderate pain.    [provider]  dicyclomine (BENTYL) 20 MG tablet Take 1 tablet (20 mg total) by mouth 2 (two) times daily as needed for spasms. 05/27/20   Marcello Fennel, PA-C  doxycycline (VIBRAMYCIN) 100 MG capsule Take 1 capsule (100 mg total) by mouth 2 (two) times daily. 10/03/20   Jeanell Sparrow, DO  naproxen (NAPROSYN) 500 MG tablet Take 1 tablet (500 mg total) by mouth 2 (two) times daily. 08/13/20   Eustaquio Maize, PA-C  omeprazole (PRILOSEC) 20 MG capsule Take 1 capsule (20 mg total) by mouth daily. 05/27/20 06/26/20  Marcello Fennel, PA-C  ondansetron (ZOFRAN) 4 MG tablet Take 1 tablet (4 mg total) by mouth every 8 (eight) hours as needed for nausea or vomiting. 05/27/20   Marcello Fennel, PA-C    Allergies    Bee venom, Peanut oil, and Peanut-containing drug products  Review of Systems   Review of Systems  Constitutional:  Positive for diaphoresis. Negative for chills and fever.  HENT:  Negative for ear pain and sore throat.   Eyes:  Negative for pain and visual disturbance.  Respiratory:  Negative for cough and shortness of breath.   Cardiovascular:  Positive for chest pain. Negative for palpitations.  Gastrointestinal:  Negative for abdominal pain and vomiting.  Genitourinary:  Negative for dysuria and hematuria.  Musculoskeletal:  Negative for arthralgias and back pain.  Skin:  Negative for color change and rash.  Neurological:  Positive for dizziness. Negative for seizures and syncope.  All other systems reviewed and are negative.  Physical Exam Updated Vital Signs BP (!) 142/101 (BP Location: Right Arm)   Pulse 68   Temp 98.7 F (37.1 C) (Oral)   Resp 18   SpO2 100%   Physical Exam Vitals and nursing note reviewed.  Constitutional:      General: He is not in acute distress.    Appearance: Normal appearance. He is well-developed.  HENT:     Head: Normocephalic and atraumatic.     Right Ear: External ear normal.      Left Ear: External ear normal.     Nose: Nose normal. No congestion.     Mouth/Throat:     Mouth: Mucous membranes are moist.     Pharynx: Oropharynx is clear. No posterior oropharyngeal erythema.  Eyes:     Extraocular Movements: Extraocular movements intact.     Conjunctiva/sclera: Conjunctivae normal.     Pupils: Pupils are equal, round, and reactive to light.  Cardiovascular:     Rate and Rhythm: Normal rate and regular rhythm.     Pulses: Normal pulses.          Radial pulses are 2+ on the right side and 2+ on the left side.     Heart sounds: No murmur heard.   No gallop.  Pulmonary:     Effort: Pulmonary effort is normal. No respiratory distress.     Breath sounds: Normal breath sounds. No wheezing, rhonchi or rales.  Abdominal:     General: Abdomen is flat. Bowel sounds are normal.     Palpations: Abdomen is soft.     Tenderness: There is no abdominal tenderness. There is no guarding or rebound.  Musculoskeletal:        General: No swelling, tenderness or deformity. Normal range of motion.     Cervical back: Normal range of motion and neck supple. No rigidity.  Skin:    General: Skin is warm and dry.     Capillary Refill: Capillary refill takes less than 2 seconds.     Findings: No rash.  Neurological:     General: No focal deficit present.     Mental Status: He is alert and oriented to person, place, and time.  Psychiatric:        Mood and Affect: Mood normal.    ED Results / Procedures / Treatments   Labs (all labs ordered are listed, but only abnormal results are displayed) Labs Reviewed  BASIC METABOLIC PANEL - Abnormal; Notable for the following components:      Result Value   CO2 21 (*)    All other components within normal limits  CBC - Abnormal; Notable for the following components:   WBC 10.6 (*)    All other components within normal limits  BRAIN NATRIURETIC PEPTIDE  CBG MONITORING, ED  TROPONIN I (HIGH SENSITIVITY)  TROPONIN I (HIGH SENSITIVITY)     EKG EKG Interpretation  Date/Time:  Monday January 05 2021 15:45:53 EST Ventricular Rate:  78 PR Interval:  180 QRS Duration: 70 QT Interval:  382 QTC Calculation: 435 R Axis:   51 Text Interpretation: Normal sinus rhythm Nonspecific T wave abnormality Abnormal ECG Confirmed by Blane Ohara [phone]) on 01/05/2021 8:07:43 PM  Radiology DG Chest 2 View  Result Date: 01/05/2021 CLINICAL DATA:  Chest pain with history of right-sided pneumothorax.  EXAM: CHEST - 2 VIEW COMPARISON:  August 23, 2014 FINDINGS: Cardiac loop recorder device overlies the left chest. The heart size and mediastinal contours are within normal limits. No focal consolidation. No pleural effusion. No pneumothorax. The visualized skeletal structures are unremarkable. IMPRESSION: No active cardiopulmonary disease. Electronically Signed   By: Dahlia Bailiff M.D.   On: 01/05/2021 17:35    Procedures Procedures   Medications Ordered in ED Medications - No data to display  ED Course  I have reviewed the triage vital signs and the nursing notes.  Pertinent labs & imaging results that were available during my care of the patient were reviewed by me and considered in my medical decision making (see chart for details).    MDM Rules/Calculators/A&P HEAR Score: 25                         55 year old male presenting with chest pain as above.  He is afebrile, hemodynamically stable.  Chest pain is resolved.  EKG showed sinus rhythm with nonspecific T wave changes.  His HEAR score is 4, moderate risk.  Initial troponin normal.  Delta unchanged.  Low concern for ACS. Chest x-ray showed no acute cardiopulmonary process.  Patient does have a loop recorder for prior syncopal episodes.  He is new to the area and look for cardiologist.  His BNP is normal and he has no lower extremity edema on exam.  Low concern for heart failure exacerbation.  Electrolytes reassuring.  Creatinine normal.  No sharp pain tearing to back, no severe  hypertension, no pulse deficits on exam.  Low concern for aortic dissection.  He is not tachycardic or tachypneic.  No unilateral lower extremity swelling.  Low concern for PE.  Appropriate for discharge home with close cardiology follow-up for updated stress test and echo if normal.  Patient comfortable with plan.  Strict return precautions provided.   Final Clinical Impression(s) / ED Diagnoses Final diagnoses:  Chest pain, unspecified type    Rx / DC Orders ED Discharge Orders          Ordered    Ambulatory referral to Cardiology        01/05/21 2017             Idamae Lusher, MD 01/05/21 2019    Elnora Morrison, MD 01/05/21 2356

## 2021-01-05 NOTE — ED Provider Notes (Signed)
Emergency Medicine Provider Triage Evaluation Note  Raymond Mcclure , a 55 y.o. male  was evaluated in triage.  Pt complains of chest pain since this afternoon. Patient was out shopping when he had sudden onset chest pain radiating to his arm and legs. Patient has had loop recorder for last 1 year. Patient has history of GERD and is noncompliant on PPI  Review of Systems  Positive: Nausea, weakness, diaphoresis, SOB, CP, diarrhea Negative: Lower extremity swelling, vomiting, syncope   Physical Exam  BP 132/88   Pulse 71   Temp 98.7 F (37.1 C) (Oral)   Resp 16   SpO2 96%  Gen:   Awake, no distress   Resp:  Normal effort  MSK:   Moves extremities without difficulty  Other:    Medical Decision Making  Medically screening exam initiated at 4:02 PM.  Appropriate orders placed.  Raymond Mcclure was informed that the remainder of the evaluation will be completed by another provider, this initial triage assessment does not replace that evaluation, and the importance of remaining in the ED until their evaluation is complete.     Raymond Decant, PA-C 01/05/21 1604    Raymond Core, MD 01/06/21 1241

## 2021-01-27 ENCOUNTER — Other Ambulatory Visit: Payer: Self-pay

## 2021-01-27 ENCOUNTER — Ambulatory Visit (INDEPENDENT_AMBULATORY_CARE_PROVIDER_SITE_OTHER): Payer: 59 | Admitting: Cardiology

## 2021-01-27 ENCOUNTER — Encounter: Payer: Self-pay | Admitting: Cardiology

## 2021-01-27 VITALS — BP 124/94 | HR 85 | Ht 70.0 in | Wt 260.4 lb

## 2021-01-27 DIAGNOSIS — E785 Hyperlipidemia, unspecified: Secondary | ICD-10-CM | POA: Insufficient documentation

## 2021-01-27 DIAGNOSIS — R079 Chest pain, unspecified: Secondary | ICD-10-CM | POA: Diagnosis not present

## 2021-01-27 DIAGNOSIS — R0789 Other chest pain: Secondary | ICD-10-CM | POA: Insufficient documentation

## 2021-01-27 DIAGNOSIS — I2510 Atherosclerotic heart disease of native coronary artery without angina pectoris: Secondary | ICD-10-CM | POA: Insufficient documentation

## 2021-01-27 DIAGNOSIS — E669 Obesity, unspecified: Secondary | ICD-10-CM

## 2021-01-27 DIAGNOSIS — R0602 Shortness of breath: Secondary | ICD-10-CM | POA: Diagnosis not present

## 2021-01-27 DIAGNOSIS — I471 Supraventricular tachycardia, unspecified: Secondary | ICD-10-CM | POA: Insufficient documentation

## 2021-01-27 DIAGNOSIS — Z7689 Persons encountering health services in other specified circumstances: Secondary | ICD-10-CM | POA: Insufficient documentation

## 2021-01-27 MED ORDER — ISOSORBIDE MONONITRATE ER 30 MG PO TB24: 30.0000 mg | ORAL_TABLET | Freq: Every day | ORAL | 3 refills | Status: DC

## 2021-01-27 MED ORDER — DILTIAZEM HCL ER COATED BEADS 360 MG PO CP24: 360.0000 mg | ORAL_CAPSULE | Freq: Every day | ORAL | 3 refills | Status: DC

## 2021-01-27 NOTE — Patient Instructions (Addendum)
Medication Instructions:  Your physician has recommended you make the following change in your medication:  INCREASE: Cardizem 360 mg once daily START: Imdur 30 mg once daily *If you need a refill on your cardiac medications before your next appointment, please call your pharmacy*   Lab Work: None If you have labs (blood work) drawn today and your tests are completely normal, you will receive your results only by: MyChart Message (if you have MyChart) OR A paper copy in the mail If you have any lab test that is abnormal or we need to change your treatment, we will call you to review the results.   Testing/Procedures: Your physician has requested that you have an echocardiogram. Echocardiography is a painless test that uses sound waves to create images of your heart. It provides your doctor with information about the size and shape of your heart and how well your hearts chambers and valves are working. This procedure takes approximately one hour. There are no restrictions for this procedure.    Follow-Up: At [facility], you and your health needs are our priority.  As part of our continuing mission to provide you with exceptional heart care, we have created designated Provider Care Teams.  These Care Teams include your primary Cardiologist (physician) and Advanced Practice Providers (APPs -  Physician Assistants and Nurse Practitioners) who all work together to provide you with the care you need, when you need it.  We recommend signing up for the patient portal called "MyChart".  Sign up information is provided on this After Visit Summary.  MyChart is used to connect with patients for Virtual Visits (Telemedicine).  Patients are able to view lab/test results, encounter notes, upcoming appointments, etc.  Non-urgent messages can be sent to your provider as well.   To learn more about what you can do with MyChart, go to [email or web address].    Your next appointment:   3  month(s)  The format for your next appointment:   In Person  Provider:   Thomasene Ripple, DO     Other Instructions

## 2021-01-27 NOTE — Progress Notes (Addendum)
Cardiology Office Note:    Date:  01/27/2021   ID:  Raymond Mcclure, DOB May 08, 1965, MRN [id number]  PCP:  Patient, No Pcp Per (Inactive)  Cardiologist:  Thomasene Ripple, DO  Electrophysiologist:  None   Referring MD: Blane Ohara, MD   " I have been experiencing fluttering as well as chest pain  History of Present Illness:    Raymond Mcclure is a 55 y.o. 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  He previously followed with [facility] in [place] and was last seen by the group in November 2022 during that time is noted that he was advised to resume his medications which was his Cardizem to 40 mg daily, he also was advised to reestablish care with PCP.  He is here today he tells me that he had been experiencing intermittent chest discomfort as well as palpitations.  He tells me that the chest pain is more of a midsternal sensation which comes and goes.  Usually it is not on exertion.  Sometimes is associated with the palpitation.  He has not had any prolonged episodes.  But he also tells me that he has had some shortness of breath on exertion.  He notes that this that he feel significantly short of breath he feels fatigue as well.  No other complaints at this time.  Past Medical History:  Diagnosis Date   Cluster headaches    Drug abuse (HCC)    Migraines    Obesity    Tachycardia     Past Surgical History:  Procedure Laterality Date   LOOP RECORDER IMPLANT     WRIST SURGERY     nerve repair    Current Medications: Current Meds  Medication Sig   acetaminophen (TYLENOL) 500 MG tablet Take 2,000 mg by mouth every 4 (four) hours as needed for moderate pain.   diltiazem (CARDIZEM CD) 360 MG 24 hr capsule Take 1 capsule (360 mg total) by mouth daily.   isosorbide mononitrate (IMDUR) 30 MG  24 hr tablet Take 1 tablet (30 mg total) by mouth daily.   [DISCONTINUED] diltiazem (CARDIZEM CD) 240 MG 24 hr capsule Take 240 mg by mouth daily.     Allergies:   Bee venom, Peanut oil, and Peanut-containing drug products   Social History   Socioeconomic History   Marital status: Legally Separated    Spouse name: Not on file   Number of children: Not on file   Years of education: Not on file   Highest education level: Not on file  Occupational History   Not on file  Tobacco Use   Smoking status: Some Days    Packs/day: 0.15    Types: Cigarettes   Smokeless tobacco: Never  Substance and Sexual Activity   Alcohol use: No   Drug use: Not Currently   Sexual activity: Not on file    Comment: crack  Other Topics Concern   Not on file  Social History Narrative   Not on file   Social Determinants of Health   Financial Resource Strain: Not on file  Food Insecurity: Not on file  Transportation Needs: Not on file  Physical Activity: Not on file  Stress: Not on file  Social Connections: Not on file     Family History: The patient's family history is not on  file.  ROS:   Review of Systems  Constitution: Negative for decreased appetite, fever and weight gain.  HENT: Negative for congestion, ear discharge, hoarse voice and sore throat.   Eyes: Negative for discharge, redness, vision loss in right eye and visual halos.  Cardiovascular: Negative for chest pain, dyspnea on exertion, leg swelling, orthopnea and palpitations.  Respiratory: Negative for cough, hemoptysis, shortness of breath and snoring.   Endocrine: Negative for heat intolerance and polyphagia.  Hematologic/Lymphatic: Negative for bleeding problem. Does not bruise/bleed easily.  Skin: Negative for flushing, nail changes, rash and suspicious lesions.  Musculoskeletal: Negative for arthritis, joint pain, muscle cramps, myalgias, neck pain and stiffness.  Gastrointestinal: Negative for abdominal pain, bowel  incontinence, diarrhea and excessive appetite.  Genitourinary: Negative for decreased libido, genital sores and incomplete emptying.  Neurological: Negative for brief paralysis, focal weakness, headaches and loss of balance.  Psychiatric/Behavioral: Negative for altered mental status, depression and suicidal ideas.  Allergic/Immunologic: Negative for HIV exposure and persistent infections.    EKGs/Labs/Other Studies Reviewed:    The following studies were reviewed today:   EKG:  The ekg ordered today demonstrates sinus rhythm, heart rate 85 bpm with nonspecific ST changes.   14-day event recorder enrolled from 09/27/2019 through 10/10/2019.  96% monitor time.   NSR with sinus tachycardia.  Patient had a 27 manually triggered events which were associated with either NSR or sinus tachycardia.   Patient had 7 automatically detected events. 3 of these were PSVT at rates ranging from 172 to 178 bpm lasting as much is 1 minute. The other 4 were sinus rhythm or sinus tachycardia.  There were no episodes of atrial fibrillation or atrial flutter. There were no ventricular arrhythmias.   Conclusion: No significant arrhythmia associated with the patient's manually detected events. 3 episodes of PSVT which occurred without patient reported symptoms.    Event monitor 08/2019   Cath 07/2019 Findings: Left main: Short, large, patent. LAD: Minor diffuse disease but no obstructive disease. The LAD wraps around the apex. The diagonal vessels are small. Ramus intermedius or high OM: Prominent vessel that bifurcates in the midportion to 2 large and tortuous vessels. There is no obstructive disease. Left circumflex: Dominant large vessel without obstructive disease. The mid to distal OM's are small. RCA: Nondominant small vessel, patent.  Conclusion: No obstructive CAD.  Recent Labs: 08/13/2020: ALT 29 01/05/2021: B Natriuretic Peptide 26.5; BUN 14; Creatinine, Ser 0.90; Hemoglobin 14.8; Platelets 335;  Potassium 4.1; Sodium 138  Recent Lipid Panel    Component Value Date/Time   CHOL  07/15/2006 0405    147        ATP III CLASSIFICATION:  <200     mg/dL   Desirable  381-829  mg/dL   Borderline High  >=937    mg/dL   High   TRIG 84 16/96/7893 0405   HDL 28 (L) 07/15/2006 0405   CHOLHDL 5.3 07/15/2006 0405   VLDL 17 07/15/2006 0405   LDLCALC (H) 07/15/2006 0405    102        Total Cholesterol/HDL:CHD Risk Coronary Heart Disease Risk Table                     Men   Women  1/2 Average Risk   3.4   3.3    Physical Exam:    VS:  BP (!) 124/94 (BP Location: Left Arm) Comment (BP Location): Right arm also   Pulse 85    Ht 5\' 10"  (1.778  m)    Wt 260 lb 6.4 oz (118.1 kg)    SpO2 95%    BMI 37.36 kg/m     Wt Readings from Last 3 Encounters:  01/27/21 260 lb 6.4 oz (118.1 kg)  08/13/20 255 lb 11.7 oz (116 kg)  05/27/20 256 lb (116.1 kg)     GEN: Well nourished, well developed in no acute distress HEENT: Normal NECK: No JVD; No carotid bruits LYMPHATICS: No lymphadenopathy CARDIAC: S1S2 noted,RRR, no murmurs, rubs, gallops RESPIRATORY:  Clear to auscultation without rales, wheezing or rhonchi  ABDOMEN: Soft, non-tender, non-distended, +bowel sounds, no guarding. EXTREMITIES: No edema, No cyanosis, no clubbing MUSCULOSKELETAL:  No deformity  SKIN: Warm and dry NEUROLOGIC:  Alert and oriented x 3, non-focal PSYCHIATRIC:  Normal affect, good insight  ASSESSMENT:    1. Mild CAD   2. Encounter to establish care   3. SOB (shortness of breath)   4. SVT (supraventricular tachycardia) (HCC)   5. Chest pain of uncertain etiology   6. Dyslipidemia   7. Obesity (BMI 30-39.9)    PLAN:     His chest discomfort could be multifactorial especially in the setting of his palpitations and his mild nonobstructive CAD.  I discussed with the patient about his options.  We will likely do is increase his Cardizem to 360 mg daily hopefully this will help with his PSVT.  In the meantime he also  does have mild nonobstructive CAD and I would like to do a trial of antianginals and see if this is going to help.  Cath in June 2021 did not show any obstructive disease reported not well-defined but noted minor LAD nonobstructive CAD.  He really should be on aspirin and statin but he is not and is apprehensive for now.  Plan to repeat lipid profile and hemoglobin A1c at his follow-up visit should this be elevated will help with the discussion again.  Smoking cessation advised.  The patient understands the need to lose weight with diet and exercise. We have discussed specific strategies for this.  We will have him establish care with our EP/device clinic.  In terms of his shortness of breath and fatigue we will get an echocardiogram to assess LV function and any other structural abnormalities.  The patient is in agreement with the above plan. The patient left the office in stable condition.  The patient will follow up in 3 months or sooner if needed.   Medication Adjustments/Labs and Tests Ordered: Current medicines are reviewed at length with the patient today.  Concerns regarding medicines are outlined above.  Orders Placed This Encounter  Procedures   Ambulatory referral to Family Practice   Ambulatory referral to Cardiac Electrophysiology   EKG 12-Lead   ECHOCARDIOGRAM COMPLETE   Meds ordered this encounter  Medications   isosorbide mononitrate (IMDUR) 30 MG 24 hr tablet    Sig: Take 1 tablet (30 mg total) by mouth daily.    Dispense:  90 tablet    Refill:  3   diltiazem (CARDIZEM CD) 360 MG 24 hr capsule    Sig: Take 1 capsule (360 mg total) by mouth daily.    Dispense:  90 capsule    Refill:  3    Patient Instructions  Medication Instructions:  Your physician has recommended you make the following change in your medication:  INCREASE: Cardizem 360 mg once daily START: Imdur 30 mg once daily *If you need a refill on your cardiac medications before your next  appointment, please call  your pharmacy*   Lab Work: None If you have labs (blood work) drawn today and your tests are completely normal, you will receive your results only by: MyChart Message (if you have MyChart) OR A paper copy in the mail If you have any lab test that is abnormal or we need to change your treatment, we will call you to review the results.   Testing/Procedures: Your physician has requested that you have an echocardiogram. Echocardiography is a painless test that uses sound waves to create images of your heart. It provides your doctor with information about the size and shape of your heart and how well your hearts chambers and valves are working. This procedure takes approximately one hour. There are no restrictions for this procedure.    Follow-Up: At [facility], you and your health needs are our priority.  As part of our continuing mission to provide you with exceptional heart care, we have created designated Provider Care Teams.  These Care Teams include your primary Cardiologist (physician) and Advanced Practice Providers (APPs -  Physician Assistants and Nurse Practitioners) who all work together to provide you with the care you need, when you need it.  We recommend signing up for the patient portal called "MyChart".  Sign up information is provided on this After Visit Summary.  MyChart is used to connect with patients for Virtual Visits (Telemedicine).  Patients are able to view lab/test results, encounter notes, upcoming appointments, etc.  Non-urgent messages can be sent to your provider as well.   To learn more about what you can do with MyChart, go to [email or web address].    Your next appointment:   3 month(s)  The format for your next appointment:   In Person  Provider:   Thomasene Ripple, DO     Other Instructions     Adopting a Healthy Lifestyle.  Know what a healthy weight is for you (roughly BMI <25) and aim to maintain this   Aim for 7+  servings of fruits and vegetables daily   65-80+ fluid ounces of water or unsweet tea for healthy kidneys   Limit to max 1 drink of alcohol per day; avoid smoking/tobacco   Limit animal fats in diet for cholesterol and heart health - choose grass fed whenever available   Avoid highly processed foods, and foods high in saturated/trans fats   Aim for low stress - take time to unwind and care for your mental health   Aim for 150 min of moderate intensity exercise weekly for heart health, and weights twice weekly for bone health   Aim for 7-9 hours of sleep daily   When it comes to diets, agreement about the perfect plan isnt easy to find, even among the experts. Experts at the [facility] [employer] developed an idea known as the Healthy Eating Plate. Just imagine a plate divided into logical, healthy portions.   The emphasis is on diet quality:   Load up on vegetables and fruits - one-half of your plate: Aim for color and variety, and remember that potatoes dont count.   Go for whole grains - one-quarter of your plate: Whole wheat, barley, wheat berries, quinoa, oats, brown rice, and foods made with them. If you want pasta, go with whole wheat pasta.   Protein power - one-quarter of your plate: Fish, chicken, beans, and nuts are all healthy, versatile protein sources. Limit red meat.   The diet, however, does go beyond the plate, offering a few other suggestions.  Use healthy plant oils, such as olive, canola, soy, corn, sunflower and peanut. Check the labels, and avoid partially hydrogenated oil, which have unhealthy trans fats.   If youre thirsty, drink water. Coffee and tea are good in moderation, but skip sugary drinks and limit milk and dairy products to one or two daily servings.   The type of carbohydrate in the diet is more important than the amount. Some sources of carbohydrates, such as vegetables, fruits, whole grains, and beans-are healthier than others.    Finally, stay active  Signed, Thomasene Ripple, DO  01/27/2021 10:41 AM    [facility]

## 2021-01-30 ENCOUNTER — Other Ambulatory Visit: Payer: Self-pay

## 2021-01-30 ENCOUNTER — Emergency Department (HOSPITAL_COMMUNITY): Payer: 59

## 2021-01-30 ENCOUNTER — Telehealth: Payer: Self-pay | Admitting: Physician Assistant

## 2021-01-30 ENCOUNTER — Emergency Department (HOSPITAL_COMMUNITY)
Admission: EM | Admit: 2021-01-30 | Discharge: 2021-01-31 | Disposition: A | Discharge status: Home / Self Care | Payer: 59 | Attending: Emergency Medicine | Admitting: Emergency Medicine

## 2021-01-30 DIAGNOSIS — I471 Supraventricular tachycardia: Secondary | ICD-10-CM | POA: Diagnosis not present

## 2021-01-30 DIAGNOSIS — Z79899 Other long term (current) drug therapy: Secondary | ICD-10-CM | POA: Insufficient documentation

## 2021-01-30 DIAGNOSIS — R9431 Abnormal electrocardiogram [ECG] [EKG]: Secondary | ICD-10-CM | POA: Diagnosis not present

## 2021-01-30 DIAGNOSIS — F17210 Nicotine dependence, cigarettes, uncomplicated: Secondary | ICD-10-CM | POA: Insufficient documentation

## 2021-01-30 DIAGNOSIS — Z91010 Allergy to peanuts: Secondary | ICD-10-CM | POA: Insufficient documentation

## 2021-01-30 DIAGNOSIS — I2510 Atherosclerotic heart disease of native coronary artery without angina pectoris: Secondary | ICD-10-CM | POA: Diagnosis not present

## 2021-01-30 DIAGNOSIS — R69 Illness, unspecified: Secondary | ICD-10-CM | POA: Diagnosis not present

## 2021-01-30 DIAGNOSIS — R0789 Other chest pain: Secondary | ICD-10-CM | POA: Diagnosis not present

## 2021-01-30 DIAGNOSIS — R079 Chest pain, unspecified: Secondary | ICD-10-CM

## 2021-01-30 LAB — BASIC METABOLIC PANEL
Anion gap: 7 (ref 5–15)
BUN: 17 mg/dL (ref 6–20)
CO2: 24 mmol/L (ref 22–32)
Calcium: 9.2 mg/dL (ref 8.9–10.3)
Chloride: 106 mmol/L (ref 98–111)
Creatinine, Ser: 0.85 mg/dL (ref 0.61–1.24)
GFR, Estimated: >60 mL/min (ref >60)
Glucose, Bld: 92 mg/dL (ref 70–99)
Potassium: 4 mmol/L (ref 3.5–5.1)
Sodium: 137 mmol/L (ref 135–145)

## 2021-01-30 LAB — CBC WITH DIFFERENTIAL/PLATELET
Abs Immature Granulocytes: 0.03 10*3/uL (ref 0.00–0.07)
Basophils Absolute: 0.1 10*3/uL (ref 0.0–0.1)
Basophils Relative: 1 %
Eosinophils Absolute: 0.2 10*3/uL (ref 0.0–0.5)
Eosinophils Relative: 2 %
HCT: 45.3 % (ref 39.0–52.0)
Hemoglobin: 15.1 g/dL (ref 13.0–17.0)
Immature Granulocytes: 0 %
Lymphocytes Relative: 38 %
Lymphs Abs: 3.9 10*3/uL (ref 0.7–4.0)
MCH: 31.8 pg (ref 26.0–34.0)
MCHC: 33.3 g/dL (ref 30.0–36.0)
MCV: 95.4 fL (ref 80.0–100.0)
Monocytes Absolute: 0.8 10*3/uL (ref 0.1–1.0)
Monocytes Relative: 8 %
Neutro Abs: 5.4 10*3/uL (ref 1.7–7.7)
Neutrophils Relative %: 51 %
Platelets: 326 10*3/uL (ref 150–400)
RBC: 4.75 MIL/uL (ref 4.22–5.81)
RDW: 12.6 % (ref 11.5–15.5)
WBC: 10.5 10*3/uL (ref 4.0–10.5)
nRBC: 0 % (ref 0.0–0.2)

## 2021-01-30 LAB — TROPONIN I (HIGH SENSITIVITY): Troponin I (High Sensitivity): 3 ng/L (ref <18)

## 2021-01-30 MED ORDER — METOPROLOL TARTRATE 25 MG PO TABS
25.0000 mg | ORAL_TABLET | Freq: Once | ORAL | Status: AC
  Administered 2021-01-31: 25 mg via ORAL
  Filled 2021-01-30: qty 1

## 2021-01-30 NOTE — ED Triage Notes (Signed)
Patient reports chest pain x several days, went to pcp on 01/27/21. says it feels like someone is tapping on chest, pain getting worse daily. Pain rated 9/10 in triage. Patient says he has cardiologist in [place] for same condition.

## 2021-01-30 NOTE — ED Provider Notes (Signed)
Emergency Medicine Provider Triage Evaluation Note  Raymond Mcclure , a 55 y.o. male  was evaluated in triage.  Pt complains of chest pain.  He states that he describes the pain as central, nonradiating and "like somebody is hitting a hammer on his chest from the inside out."  He states that he was seen at the cardiologist on Wednesday who initiated him on Imdur and is scheduled for an echo.  He states that since then the symptoms have only been getting worse.  He states that he has a loop recorder and that whoever monitors the recorder has been calling him on numerous occasions asking if he is having any symptoms.  He is unable to tell me what type of rhythm or abnormality the loop recorder is picking up.  He endorses shortness of breath, palpitations and new left leg numbness and the sensation of it being swollen.  He denies recent travel, surgeries, malignancy.  He does smoke occasionally.  No history of DVT/PE  Review of Systems  Positive: See above Negative:   Physical Exam  BP (!) 160/103 (BP Location: Right Arm)    Pulse 91    Temp 98.2 F (36.8 C) (Oral)    Resp 20    Ht 5\' 10"  (1.778 m)    Wt 117.9 kg    SpO2 98%    BMI 37.31 kg/m  Gen:   Awake, no distress   Resp:  Normal effort  MSK:   Moves extremities without difficulty  Other:  S1/S2 without murmur.  No appreciable unilateral swelling in the left lower leg.  No tenderness to palpation.  Medical Decision Making  Medically screening exam initiated at 8:16 PM.  Appropriate orders placed.  Raymond Mcclure was informed that the remainder of the evaluation will be completed by another provider, this initial triage assessment does not replace that evaluation, and the importance of remaining in the ED until their evaluation is complete.     Raymond Batty, PA-C 01/30/21 2018    02/01/21, MD 01/31/21 [phone]

## 2021-01-30 NOTE — Telephone Encounter (Signed)
° ° °  Patient is concerned because he has been having recurrent chest pain and palpitations.  The pain is associated with shortness of breath, it is a burning pain.  He has not had nausea or diaphoresis.  He previously lived in [place] and had a loop recorder implanted in [place].  Per Care Everywhere notes, he has had multiple episodes of SVT and atrial tach.  From Care Everywhere: 01/30/2021 I sent the Lux info on Raymond Mcclure with strips for review to Dr. Allena Katz. He sent me a text to confirm he'll look at it in [place]. He asked that I have you call the patient regarding symptoms he was feeling yesterday. I put in the report that during the SVT at 17:15 EST patient had SVT 183-188 - [name] said he was dizzy and chest was hurting - he was annoyed he could not find his phone also. SVT at 17:02 yesterday he said he just "didn't feel good" and also said chest hurt and felt dizzy. He also said he's been SOB - one reported symptom of SOB yesterday at 14:56 revealed SR. 7.5 hours of AT with no symptom reported at 15:44 showed ST 100 bpm.   Called patient to assess symptoms. Patient states at the time of the alerts yesterday he was watching tv and had just gotten up. He said he felt pain in his chest, dizziness, shortness of breath, left leg pain, and like he could feel his heart beat in his whole body. He said he felt like he didn't have any energy and still feels like that today. Patient states these symptoms "calm down but never go away". He said he checked his blood pressure about the time he started to feel bad which was around 3 or 4pm and it was 135/95. He checked it again this morning when he got up at 9am and it was 140/95. Advised patient that I will forward this information to Dr. Allena Katz.   Per Dr. Allena Katz: Please make an OV with me to discuss ablation.  Called patient and spoke with him. All questions answered.  Added atenolol 25mg  daily   I discussed the situation with the patient.  I advised that I  did not have a way to assist him over the phone.  When he took his blood pressure earlier, it was 135 or not over 95 and his heart rate was normal.  However, he continues to have chest pain and shortness of breath.  I recommended that he call 911 and come by EMS to the closest hospital.  He is in agreement with this plan.  , PA-C 01/30/2021 7:19 PM

## 2021-01-31 LAB — TROPONIN I (HIGH SENSITIVITY): Troponin I (High Sensitivity): 3 ng/L (ref <18)

## 2021-01-31 MED ORDER — METOPROLOL SUCCINATE ER 25 MG PO TB24: 25.0000 mg | ORAL_TABLET | Freq: Every day | ORAL | 0 refills | Status: DC

## 2021-01-31 NOTE — Discharge Instructions (Signed)
Call your primary care doctor or specialist as discussed in the next 2-3 days.   Return immediately back to the ER if:  Your symptoms worsen within the next 12-24 hours. You develop new symptoms such as new fevers, persistent vomiting, new pain, shortness of breath, or new weakness or numbness, or if you have any other concerns.  

## 2021-01-31 NOTE — ED Provider Notes (Signed)
[facility]-EMERGENCY DEPT Provider Note   CSN: [id number] Arrival date & time: 01/30/21  1935     History Chief Complaint  Patient presents with   Chest Pain    Raymond Mcclure is a 55 y.o. male.  Patient presents chief complaint of chest pain.  Describes as a pounding chest pain that he has nearly every day for the past year.  He is followed by [facility] health cardiology with whom he had an appointment just a few days ago.  He is also awake health team who had put in a loop recorder.  They have been calling them all day to go to the ER at [facility], however he stated it was too far and presents here.  He is not sure what the loop recorder had recorded.  Currently denies any chest pain but states that it comes and goes nearly every day.  Denies fevers or cough or vomiting or diarrhea.      Past Medical History:  Diagnosis Date   Cluster headaches    Drug abuse (HCC)    Migraines    Obesity    Tachycardia     Patient Active Problem List   Diagnosis Date Noted   Encounter to establish care 01/27/2021   SOB (shortness of breath) 01/27/2021   SVT (supraventricular tachycardia) (HCC) 01/27/2021   Chest pain of uncertain etiology 01/27/2021   Mild CAD 01/27/2021   Dyslipidemia 01/27/2021   Obesity (BMI 30-39.9) 01/27/2021    Past Surgical History:  Procedure Laterality Date   LOOP RECORDER IMPLANT     WRIST SURGERY     nerve repair       No family history on file.  Social History   Tobacco Use   Smoking status: Some Days    Packs/day: 0.15    Types: Cigarettes   Smokeless tobacco: Never  Substance Use Topics   Alcohol use: No   Drug use: Not Currently    Home Medications Prior to Admission medications   Medication Sig Start Date End Date Taking? Authorizing Provider  metoprolol succinate (TOPROL-XL) 25 MG 24 hr tablet Take 1 tablet (25 mg total) by mouth daily. 01/31/21 03/02/21 Yes Cheryll Cockayne, MD  acetaminophen (TYLENOL) 500 MG  tablet Take 2,000 mg by mouth every 4 (four) hours as needed for moderate pain.    [provider]  dicyclomine (BENTYL) 20 MG tablet Take 1 tablet (20 mg total) by mouth 2 (two) times daily as needed for spasms. Patient not taking: Reported on 01/27/2021 05/27/20   Carroll Sage, PA-C  diltiazem (CARDIZEM CD) 360 MG 24 hr capsule Take 1 capsule (360 mg total) by mouth daily. 01/27/21   Tobb, Kardie, DO  doxycycline (VIBRAMYCIN) 100 MG capsule Take 1 capsule (100 mg total) by mouth 2 (two) times daily. Patient not taking: Reported on 01/27/2021 10/03/20   Tanda Rockers A, DO  isosorbide mononitrate (IMDUR) 30 MG 24 hr tablet Take 1 tablet (30 mg total) by mouth daily. 01/27/21   Tobb, Kardie, DO  naproxen (NAPROSYN) 500 MG tablet Take 1 tablet (500 mg total) by mouth 2 (two) times daily. Patient not taking: Reported on 01/27/2021 08/13/20   Tanda Rockers, PA-C  omeprazole (PRILOSEC) 20 MG capsule Take 1 capsule (20 mg total) by mouth daily. Patient not taking: Reported on 01/27/2021 05/27/20 01/27/21  Carroll Sage, PA-C  ondansetron (ZOFRAN) 4 MG tablet Take 1 tablet (4 mg total) by mouth every 8 (eight) hours as needed for  nausea or vomiting. Patient not taking: Reported on 01/27/2021 05/27/20   Marcello Fennel, PA-C    Allergies    Bee venom, Peanut oil, and Peanut-containing drug products  Review of Systems   Review of Systems  Constitutional:  Negative for fever.  HENT:  Negative for ear pain and sore throat.   Eyes:  Negative for pain.  Respiratory:  Negative for cough.   Cardiovascular:  Positive for chest pain.  Gastrointestinal:  Negative for abdominal pain.  Genitourinary:  Negative for flank pain.  Musculoskeletal:  Negative for back pain.  Skin:  Negative for color change and rash.  Neurological:  Negative for syncope.  All other systems reviewed and are negative.  Physical Exam Updated Vital Signs BP (!) 159/119    Pulse 80    Temp 98.2 F (36.8 C)  (Oral)    Resp 14    Ht 5\' 10"  (1.778 m)    Wt 117.9 kg    SpO2 98%    BMI 37.31 kg/m   Physical Exam Constitutional:      Appearance: He is well-developed.  HENT:     Head: Normocephalic.     Nose: Nose normal.  Eyes:     Extraocular Movements: Extraocular movements intact.  Cardiovascular:     Rate and Rhythm: Normal rate.  Pulmonary:     Effort: Pulmonary effort is normal.  Skin:    Coloration: Skin is not jaundiced.  Neurological:     Mental Status: He is alert. Mental status is at baseline.    ED Results / Procedures / Treatments   Labs (all labs ordered are listed, but only abnormal results are displayed) Labs Reviewed  BASIC METABOLIC PANEL  CBC WITH DIFFERENTIAL/PLATELET  TROPONIN I (HIGH SENSITIVITY)  TROPONIN I (HIGH SENSITIVITY)    EKG EKG Interpretation  Date/Time:  Friday January 30 2021 19:52:38 EST Ventricular Rate:  88 PR Interval:  172 QRS Duration: 74 QT Interval:  303 QTC Calculation: 367 R Axis:   61 Text Interpretation: Sinus rhythm Borderline T wave abnormalities Confirmed by Thamas Jaegers ([id number]) on 01/30/2021 10:10:37 PM  Radiology DG Chest 2 View  Result Date: 01/30/2021 CLINICAL DATA:  Chest pain on the left radiating to the back. EXAM: CHEST - 2 VIEW COMPARISON:  01/05/2021 FINDINGS: Shallow inspiration with elevation of the right hemidiaphragm. Normal heart size and pulmonary vascularity. No focal airspace disease or consolidation in the lungs. No blunting of costophrenic angles. No pneumothorax. Mediastinal contours appear intact. A loop recorder is present. IMPRESSION: No active cardiopulmonary disease. Electronically Signed   By: Lucienne Capers M.D.   On: 01/30/2021 20:52    Procedures Procedures   Medications Ordered in ED Medications  metoprolol tartrate (LOPRESSOR) tablet 25 mg (25 mg Oral Given 01/31/21 0012)    ED Course  I have reviewed the triage vital signs and the nursing notes.  Pertinent labs & imaging results that  were available during my care of the patient were reviewed by me and considered in my medical decision making (see chart for details).    MDM Rules/Calculators/A&P                         Review of care everywhere notes show that the patient had SVT recorded on his loop recorder at a rate about 180 bpm.  Currently is in sinus rhythm and remains in sinus rhythm during his ER stay.  2 sets of troponin sent which are negative.  Work-up  is otherwise unremarkable EKG shows sinus rhythm no ST was depressions no T wave inversions noted.  Case discussed with on-call cardiologist, agreeable to starting the patient on additional metoprolol 25 mg to his carvedilol.  Will advise outpatient follow-up with his cardiologist this week.  Patient advised to call endocrine appointment tomorrow.  Advising immediate return if he has continued rapid chest pain worsening symptoms or any additional concerns, to return immediately to the ER.    Final Clinical Impression(s) / ED Diagnoses Final diagnoses:  Nonspecific chest pain  Paroxysmal SVT (supraventricular tachycardia) ([place])    Rx / DC Orders ED Discharge Orders          Ordered    metoprolol succinate (TOPROL-XL) 25 MG 24 hr tablet  Daily        01/31/21 0028             Luna Fuse, MD 01/31/21 0028

## 2021-02-12 ENCOUNTER — Telehealth (HOSPITAL_COMMUNITY): Payer: Self-pay | Admitting: Cardiology

## 2021-02-12 NOTE — Telephone Encounter (Signed)
Patient called and cancelled echocardiogram for the reason below:  02/12/21 Patient called and cancelled due to he is having at Cardiac Procedure at [facility] med on his heart/LBW   Order will be removed from the ECHO WQ.

## 2021-02-18 ENCOUNTER — Other Ambulatory Visit (HOSPITAL_COMMUNITY): Payer: 59

## 2021-02-18 DIAGNOSIS — I471 Supraventricular tachycardia: Secondary | ICD-10-CM | POA: Diagnosis not present

## 2021-02-18 DIAGNOSIS — Z91010 Allergy to peanuts: Secondary | ICD-10-CM | POA: Diagnosis not present

## 2021-02-18 DIAGNOSIS — Z6837 Body mass index (BMI) 37.0-37.9, adult: Secondary | ICD-10-CM | POA: Diagnosis not present

## 2021-02-18 DIAGNOSIS — K219 Gastro-esophageal reflux disease without esophagitis: Secondary | ICD-10-CM | POA: Diagnosis not present

## 2021-02-18 DIAGNOSIS — E669 Obesity, unspecified: Secondary | ICD-10-CM | POA: Diagnosis not present

## 2021-02-18 DIAGNOSIS — I4892 Unspecified atrial flutter: Secondary | ICD-10-CM | POA: Diagnosis not present

## 2021-02-18 DIAGNOSIS — F17290 Nicotine dependence, other tobacco product, uncomplicated: Secondary | ICD-10-CM | POA: Diagnosis not present

## 2021-02-18 DIAGNOSIS — E785 Hyperlipidemia, unspecified: Secondary | ICD-10-CM | POA: Diagnosis not present

## 2021-02-18 DIAGNOSIS — F319 Bipolar disorder, unspecified: Secondary | ICD-10-CM | POA: Diagnosis not present

## 2021-02-18 DIAGNOSIS — F17210 Nicotine dependence, cigarettes, uncomplicated: Secondary | ICD-10-CM | POA: Diagnosis not present

## 2021-02-18 DIAGNOSIS — R0609 Other forms of dyspnea: Secondary | ICD-10-CM | POA: Diagnosis not present

## 2021-02-18 DIAGNOSIS — G4733 Obstructive sleep apnea (adult) (pediatric): Secondary | ICD-10-CM | POA: Diagnosis not present

## 2021-02-18 DIAGNOSIS — Z8249 Family history of ischemic heart disease and other diseases of the circulatory system: Secondary | ICD-10-CM | POA: Diagnosis not present

## 2021-02-18 DIAGNOSIS — R69 Illness, unspecified: Secondary | ICD-10-CM | POA: Diagnosis not present

## 2021-02-18 DIAGNOSIS — G43909 Migraine, unspecified, not intractable, without status migrainosus: Secondary | ICD-10-CM | POA: Diagnosis not present

## 2021-02-18 DIAGNOSIS — Z91030 Bee allergy status: Secondary | ICD-10-CM | POA: Diagnosis not present

## 2021-02-18 DIAGNOSIS — Z79899 Other long term (current) drug therapy: Secondary | ICD-10-CM | POA: Diagnosis not present

## 2021-02-18 DIAGNOSIS — Z4509 Encounter for adjustment and management of other cardiac device: Secondary | ICD-10-CM | POA: Diagnosis not present

## 2021-02-18 DIAGNOSIS — Z8719 Personal history of other diseases of the digestive system: Secondary | ICD-10-CM | POA: Diagnosis not present

## 2021-02-27 ENCOUNTER — Ambulatory Visit: Payer: Self-pay | Admitting: Family Medicine

## 2021-03-03 DIAGNOSIS — R079 Chest pain, unspecified: Secondary | ICD-10-CM | POA: Diagnosis not present

## 2021-03-03 DIAGNOSIS — R55 Syncope and collapse: Secondary | ICD-10-CM | POA: Diagnosis not present

## 2021-03-03 DIAGNOSIS — R42 Dizziness and giddiness: Secondary | ICD-10-CM | POA: Diagnosis not present

## 2021-03-03 DIAGNOSIS — R61 Generalized hyperhidrosis: Secondary | ICD-10-CM | POA: Diagnosis not present

## 2021-03-03 DIAGNOSIS — R0602 Shortness of breath: Secondary | ICD-10-CM | POA: Diagnosis not present

## 2021-03-03 DIAGNOSIS — R0789 Other chest pain: Secondary | ICD-10-CM | POA: Diagnosis not present

## 2021-03-05 DIAGNOSIS — R0789 Other chest pain: Secondary | ICD-10-CM | POA: Diagnosis not present

## 2021-03-05 DIAGNOSIS — Z91030 Bee allergy status: Secondary | ICD-10-CM | POA: Diagnosis not present

## 2021-03-05 DIAGNOSIS — E785 Hyperlipidemia, unspecified: Secondary | ICD-10-CM | POA: Diagnosis not present

## 2021-03-05 DIAGNOSIS — E669 Obesity, unspecified: Secondary | ICD-10-CM | POA: Diagnosis not present

## 2021-03-05 DIAGNOSIS — R079 Chest pain, unspecified: Secondary | ICD-10-CM | POA: Diagnosis not present

## 2021-03-05 DIAGNOSIS — R42 Dizziness and giddiness: Secondary | ICD-10-CM | POA: Diagnosis not present

## 2021-03-05 DIAGNOSIS — R0602 Shortness of breath: Secondary | ICD-10-CM | POA: Diagnosis not present

## 2021-03-05 DIAGNOSIS — K219 Gastro-esophageal reflux disease without esophagitis: Secondary | ICD-10-CM | POA: Diagnosis not present

## 2021-03-05 DIAGNOSIS — Z6837 Body mass index (BMI) 37.0-37.9, adult: Secondary | ICD-10-CM | POA: Diagnosis not present

## 2021-03-05 DIAGNOSIS — R69 Illness, unspecified: Secondary | ICD-10-CM | POA: Diagnosis not present

## 2021-03-05 DIAGNOSIS — I4892 Unspecified atrial flutter: Secondary | ICD-10-CM | POA: Diagnosis not present

## 2021-03-05 DIAGNOSIS — R61 Generalized hyperhidrosis: Secondary | ICD-10-CM | POA: Diagnosis not present

## 2021-03-05 DIAGNOSIS — G4733 Obstructive sleep apnea (adult) (pediatric): Secondary | ICD-10-CM | POA: Diagnosis not present

## 2021-03-29 DIAGNOSIS — Z79899 Other long term (current) drug therapy: Secondary | ICD-10-CM | POA: Diagnosis not present

## 2021-03-29 DIAGNOSIS — Z91030 Bee allergy status: Secondary | ICD-10-CM | POA: Diagnosis not present

## 2021-03-29 DIAGNOSIS — F319 Bipolar disorder, unspecified: Secondary | ICD-10-CM | POA: Diagnosis not present

## 2021-03-29 DIAGNOSIS — R69 Illness, unspecified: Secondary | ICD-10-CM | POA: Diagnosis not present

## 2021-03-29 DIAGNOSIS — K219 Gastro-esophageal reflux disease without esophagitis: Secondary | ICD-10-CM | POA: Diagnosis not present

## 2021-03-29 DIAGNOSIS — R519 Headache, unspecified: Secondary | ICD-10-CM | POA: Diagnosis not present

## 2021-03-29 DIAGNOSIS — G4733 Obstructive sleep apnea (adult) (pediatric): Secondary | ICD-10-CM | POA: Diagnosis not present

## 2021-03-29 DIAGNOSIS — F17210 Nicotine dependence, cigarettes, uncomplicated: Secondary | ICD-10-CM | POA: Diagnosis not present

## 2021-03-29 DIAGNOSIS — E669 Obesity, unspecified: Secondary | ICD-10-CM | POA: Diagnosis not present

## 2021-03-29 DIAGNOSIS — E785 Hyperlipidemia, unspecified: Secondary | ICD-10-CM | POA: Diagnosis not present

## 2021-03-29 DIAGNOSIS — Z6830 Body mass index (BMI) 30.0-30.9, adult: Secondary | ICD-10-CM | POA: Diagnosis not present

## 2021-03-29 DIAGNOSIS — R45851 Suicidal ideations: Secondary | ICD-10-CM | POA: Diagnosis not present

## 2021-03-31 ENCOUNTER — Encounter: Payer: 59 | Admitting: Cardiology

## 2021-04-02 DIAGNOSIS — R0609 Other forms of dyspnea: Secondary | ICD-10-CM | POA: Diagnosis not present

## 2021-04-02 DIAGNOSIS — G4733 Obstructive sleep apnea (adult) (pediatric): Secondary | ICD-10-CM | POA: Diagnosis not present

## 2021-04-02 DIAGNOSIS — K219 Gastro-esophageal reflux disease without esophagitis: Secondary | ICD-10-CM | POA: Diagnosis not present

## 2021-04-02 DIAGNOSIS — J986 Disorders of diaphragm: Secondary | ICD-10-CM | POA: Diagnosis not present

## 2021-04-05 DIAGNOSIS — Z87891 Personal history of nicotine dependence: Secondary | ICD-10-CM | POA: Diagnosis not present

## 2021-04-05 DIAGNOSIS — Z79899 Other long term (current) drug therapy: Secondary | ICD-10-CM | POA: Diagnosis not present

## 2021-04-05 DIAGNOSIS — G43911 Migraine, unspecified, intractable, with status migrainosus: Secondary | ICD-10-CM | POA: Diagnosis not present

## 2021-04-05 DIAGNOSIS — K219 Gastro-esophageal reflux disease without esophagitis: Secondary | ICD-10-CM | POA: Diagnosis not present

## 2021-04-05 DIAGNOSIS — E785 Hyperlipidemia, unspecified: Secondary | ICD-10-CM | POA: Diagnosis not present

## 2021-04-05 DIAGNOSIS — R519 Headache, unspecified: Secondary | ICD-10-CM | POA: Diagnosis not present

## 2021-04-05 DIAGNOSIS — G43811 Other migraine, intractable, with status migrainosus: Secondary | ICD-10-CM | POA: Diagnosis not present

## 2021-04-05 DIAGNOSIS — Z91030 Bee allergy status: Secondary | ICD-10-CM | POA: Diagnosis not present

## 2021-04-05 DIAGNOSIS — R112 Nausea with vomiting, unspecified: Secondary | ICD-10-CM | POA: Diagnosis not present

## 2021-04-08 DIAGNOSIS — F1410 Cocaine abuse, uncomplicated: Secondary | ICD-10-CM | POA: Diagnosis not present

## 2021-04-08 DIAGNOSIS — F1910 Other psychoactive substance abuse, uncomplicated: Secondary | ICD-10-CM | POA: Diagnosis not present

## 2021-04-08 DIAGNOSIS — R45851 Suicidal ideations: Secondary | ICD-10-CM | POA: Diagnosis not present

## 2021-04-08 DIAGNOSIS — F1010 Alcohol abuse, uncomplicated: Secondary | ICD-10-CM | POA: Diagnosis not present

## 2021-04-08 DIAGNOSIS — F329 Major depressive disorder, single episode, unspecified: Secondary | ICD-10-CM | POA: Diagnosis not present

## 2021-04-08 DIAGNOSIS — E669 Obesity, unspecified: Secondary | ICD-10-CM | POA: Diagnosis not present

## 2021-04-08 DIAGNOSIS — R0789 Other chest pain: Secondary | ICD-10-CM | POA: Diagnosis not present

## 2021-04-08 DIAGNOSIS — Z720 Tobacco use: Secondary | ICD-10-CM | POA: Diagnosis not present

## 2021-04-08 DIAGNOSIS — Z20822 Contact with and (suspected) exposure to covid-19: Secondary | ICD-10-CM | POA: Diagnosis not present

## 2021-04-08 DIAGNOSIS — F332 Major depressive disorder, recurrent severe without psychotic features: Secondary | ICD-10-CM | POA: Diagnosis not present

## 2021-04-08 DIAGNOSIS — R079 Chest pain, unspecified: Secondary | ICD-10-CM | POA: Diagnosis not present

## 2021-04-08 DIAGNOSIS — K219 Gastro-esophageal reflux disease without esophagitis: Secondary | ICD-10-CM | POA: Diagnosis not present

## 2021-04-08 DIAGNOSIS — I4892 Unspecified atrial flutter: Secondary | ICD-10-CM | POA: Diagnosis not present

## 2021-04-08 DIAGNOSIS — E785 Hyperlipidemia, unspecified: Secondary | ICD-10-CM | POA: Diagnosis not present

## 2021-04-08 DIAGNOSIS — Z79899 Other long term (current) drug therapy: Secondary | ICD-10-CM | POA: Diagnosis not present

## 2021-04-08 DIAGNOSIS — F17290 Nicotine dependence, other tobacco product, uncomplicated: Secondary | ICD-10-CM | POA: Diagnosis not present

## 2021-04-08 DIAGNOSIS — G4733 Obstructive sleep apnea (adult) (pediatric): Secondary | ICD-10-CM | POA: Diagnosis not present

## 2021-04-09 DIAGNOSIS — F1010 Alcohol abuse, uncomplicated: Secondary | ICD-10-CM | POA: Diagnosis not present

## 2021-04-09 DIAGNOSIS — Z720 Tobacco use: Secondary | ICD-10-CM | POA: Diagnosis not present

## 2021-04-09 DIAGNOSIS — R0789 Other chest pain: Secondary | ICD-10-CM | POA: Diagnosis not present

## 2021-04-09 DIAGNOSIS — F1910 Other psychoactive substance abuse, uncomplicated: Secondary | ICD-10-CM | POA: Diagnosis not present

## 2021-04-09 DIAGNOSIS — K219 Gastro-esophageal reflux disease without esophagitis: Secondary | ICD-10-CM | POA: Diagnosis not present

## 2021-04-09 DIAGNOSIS — G4733 Obstructive sleep apnea (adult) (pediatric): Secondary | ICD-10-CM | POA: Diagnosis not present

## 2021-04-09 DIAGNOSIS — F322 Major depressive disorder, single episode, severe without psychotic features: Secondary | ICD-10-CM | POA: Diagnosis not present

## 2021-04-09 DIAGNOSIS — R45851 Suicidal ideations: Secondary | ICD-10-CM | POA: Diagnosis not present

## 2021-04-09 DIAGNOSIS — I4892 Unspecified atrial flutter: Secondary | ICD-10-CM | POA: Diagnosis not present

## 2021-04-09 DIAGNOSIS — E669 Obesity, unspecified: Secondary | ICD-10-CM | POA: Diagnosis not present

## 2021-04-27 ENCOUNTER — Ambulatory Visit: Payer: 59 | Admitting: Cardiology

## 2021-04-28 ENCOUNTER — Encounter (HOSPITAL_COMMUNITY): Payer: Self-pay

## 2021-04-28 ENCOUNTER — Other Ambulatory Visit: Payer: Self-pay

## 2021-04-28 ENCOUNTER — Emergency Department (HOSPITAL_COMMUNITY)
Admission: EM | Admit: 2021-04-28 | Discharge: 2021-04-28 | Disposition: A | Discharge status: Home / Self Care | Payer: 59 | Attending: Emergency Medicine | Admitting: Emergency Medicine

## 2021-04-28 DIAGNOSIS — R519 Headache, unspecified: Secondary | ICD-10-CM | POA: Diagnosis not present

## 2021-04-28 DIAGNOSIS — Z91010 Allergy to peanuts: Secondary | ICD-10-CM | POA: Diagnosis not present

## 2021-04-28 DIAGNOSIS — Z79899 Other long term (current) drug therapy: Secondary | ICD-10-CM | POA: Diagnosis not present

## 2021-04-28 DIAGNOSIS — I10 Essential (primary) hypertension: Secondary | ICD-10-CM | POA: Insufficient documentation

## 2021-04-28 MED ORDER — KETOROLAC TROMETHAMINE 30 MG/ML IJ SOLN
30.0000 mg | Freq: Once | INTRAMUSCULAR | Status: AC
  Administered 2021-04-28: 30 mg via INTRAMUSCULAR
  Filled 2021-04-28: qty 1

## 2021-04-28 NOTE — ED Triage Notes (Signed)
Patient reports that he has had a headache since 04/08/21. ?Patient also reports that he had Fentanyl in his system. ? ?Patient states that he has been at [facility] and was released 4 days ?

## 2021-04-28 NOTE — ED Provider Notes (Signed)
?[facility] DEPT ?Provider Note ? ? ?CSN: [id number] ?Arrival date & time: 04/28/21  0848 ? ?  ? ?History ? ?Chief Complaint  ?Patient presents with  ? Headache  ? ? ?Raymond Mcclure is a 56 y.o. male. ? ?HPI ?56 year old male history of bipolar disorder, hypertension, status post ablation, chronic headaches with migraines, presents today stating he has had a migraine headache since March 8.  He was at [facility].  He reports that he has gotten migraine headaches before at [facility].  Describes the headache as all over his head.  There was no precipitating trauma or sudden onset.  He is not on any blood thinners.  He has not had fever, sore throat, or neck stiffness.  He states that this is different from his usual migraines due to the chronicity.  He has not been taking any medications for this.  He has not taken his blood pressure medications.  He states that he is going to get them filled and start them today. ? ?  ? ?Home Medications ?Prior to Admission medications   ?Medication Sig Start Date End Date Taking? Authorizing Provider  ?acetaminophen (TYLENOL) 500 MG tablet Take 2,000 mg by mouth every 4 (four) hours as needed for moderate pain.    [provider]  ?dicyclomine (BENTYL) 20 MG tablet Take 1 tablet (20 mg total) by mouth 2 (two) times daily as needed for spasms. ?Patient not taking: Reported on 01/27/2021 05/27/20   Marcello Fennel, PA-C  ?diltiazem (CARDIZEM CD) 360 MG 24 hr capsule Take 1 capsule (360 mg total) by mouth daily. 01/27/21   Tobb, Godfrey Pick, DO  ?doxycycline (VIBRAMYCIN) 100 MG capsule Take 1 capsule (100 mg total) by mouth 2 (two) times daily. ?Patient not taking: Reported on 01/27/2021 10/03/20   Wynona Dove A, DO  ?isosorbide mononitrate (IMDUR) 30 MG 24 hr tablet Take 1 tablet (30 mg total) by mouth daily. 01/27/21   Tobb, Kardie, DO  ?metoprolol succinate (TOPROL-XL) 25 MG 24 hr tablet Take 1 tablet (25 mg total) by mouth daily. 01/31/21 03/02/21   Luna Fuse, MD  ?naproxen (NAPROSYN) 500 MG tablet Take 1 tablet (500 mg total) by mouth 2 (two) times daily. ?Patient not taking: Reported on 01/27/2021 08/13/20   Eustaquio Maize, PA-C  ?omeprazole (PRILOSEC) 20 MG capsule Take 1 capsule (20 mg total) by mouth daily. ?Patient not taking: Reported on 01/27/2021 05/27/20 01/27/21  Marcello Fennel, PA-C  ?ondansetron (ZOFRAN) 4 MG tablet Take 1 tablet (4 mg total) by mouth every 8 (eight) hours as needed for nausea or vomiting. ?Patient not taking: Reported on 01/27/2021 05/27/20   Marcello Fennel, PA-C  ?   ? ?Allergies    ?Bee venom, Peanut oil, and Peanut-containing drug products   ? ?Review of Systems   ?Review of Systems  ?All other systems reviewed and are negative. ? ?Physical Exam ?Updated Vital Signs ?BP (!) 161/102 (BP Location: Left Arm)   Pulse 99   Temp 98.2 ?F (36.8 ?C) (Oral)   Resp 16   Ht 1.778 m (5\' 10" )   Wt 118.8 kg   SpO2 97%   BMI 37.59 kg/m?  ?Physical Exam ?Vitals reviewed.  ?Constitutional:   ?   General: He is not in acute distress. ?   Appearance: He is obese.  ?HENT:  ?   Head: Normocephalic.  ?Eyes:  ?   Extraocular Movements: Extraocular movements intact.  ?Cardiovascular:  ?   Rate and Rhythm: Normal rate  and regular rhythm.  ?   Heart sounds: Normal heart sounds.  ?Pulmonary:  ?   Effort: Pulmonary effort is normal.  ?   Breath sounds: Normal breath sounds.  ?Abdominal:  ?   General: Bowel sounds are normal.  ?   Palpations: Abdomen is soft.  ?Musculoskeletal:     ?   General: Normal range of motion.  ?   Cervical back: Normal range of motion and neck supple.  ?Skin: ?   General: Skin is warm and dry.  ?Neurological:  ?   Mental Status: He is alert.  ?   Cranial Nerves: No cranial nerve deficit or facial asymmetry.  ?   Sensory: No sensory deficit.  ?   Motor: No weakness.  ?   Coordination: Coordination normal.  ?   Gait: Gait normal.  ?   Deep Tendon Reflexes: Reflexes normal.  ?Psychiatric:     ?   Mood and Affect:  Mood normal.     ?   Speech: Speech normal.     ?   Behavior: Behavior normal.  ? ? ?ED Results / Procedures / Treatments   ?Labs ?(all labs ordered are listed, but only abnormal results are displayed) ?Labs Reviewed - No data to display ? ?EKG ?None ? ?Radiology ?No results found. ? ?Procedures ?Procedures  ? ? ?Medications Ordered in ED ?Medications  ?ketorolac (TORADOL) 30 MG/ML injection 30 mg (has no administration in time range)  ? ? ?ED Course/ Medical Decision Making/ A&P ?  ?                        ?Medical Decision Making ?56 year old male history of migraine headaches presents with ongoing headache.  He is also hypertensive and has not taken his blood pressure medications.  His blood pressure here is 160/100. ?Differential diagnosis includes intracranial etiologies such as bleeding, traumatic bleeding, spontaneous bleeding, common etiologies such as patient's known migraines, hypertensive, infectious, and multiple other etiologies ?Although patient is hypertensive here today.  He has a known history of hypertension and is noncompliant with his medications.  He will start these medications today. ?Patient has a documented history of migraine headaches and this is most consistent with his ongoing migraine headaches.  He is given ketorolac here in the ED and is advised regarding taking his acetaminophen and ibuprofen.  Additionally he is on metoprolol but has not been taking this.  Instructed to continue his other medications will help with his blood pressure and management of his migraine headaches. ?We have discussed return precautions including fever, lateralized weakness, worsening symptoms.   ?We discussed need for follow-up this week including recheck of his blood pressure.  He is given referral to neurology, behavioral health, and advised regarding obtaining follow-up primary care ? ? ?Risk ?OTC drugs. ?Prescription drug management. ?Parenteral controlled substances. ? ? ? ? ? ? ? ? ? ? ?Final  Clinical Impression(s) / ED Diagnoses ?Final diagnoses:  ?Chronic intractable headache, unspecified headache type  ?Hypertension, unspecified type  ? ? ?Rx / DC Orders ?ED Discharge Orders   ? ? None  ? ?  ? ? ?  ?Pattricia Boss, MD ?04/28/21 [phone] ? ?

## 2021-04-28 NOTE — ED Notes (Signed)
I provided reinforced discharge education based off of discharge instructions. Pt acknowledged and understood my education. Pt had no further questions/concerns for provider/myself.  °

## 2021-04-28 NOTE — Discharge Instructions (Signed)
Please get your medications filled including your blood pressure medications and metoprolol and isosorbide.  Began taking this this morning. ?Use the number on the sheet to call and make a primary care appointment ?Please follow-up and have your blood pressure rechecked this week ?You are given a shot of pain medicine here for your headache.  You may use acetaminophen and ibuprofen as on your discharge medication list. ?You are given a referral to the behavioral health urgent care and to neurology for follow-up. ?

## 2021-05-01 ENCOUNTER — Encounter: Payer: Self-pay | Admitting: Cardiology

## 2021-06-12 ENCOUNTER — Other Ambulatory Visit: Payer: Self-pay

## 2021-06-12 ENCOUNTER — Ambulatory Visit (INDEPENDENT_AMBULATORY_CARE_PROVIDER_SITE_OTHER): Payer: Self-pay | Admitting: Physician Assistant

## 2021-06-12 ENCOUNTER — Encounter (HOSPITAL_COMMUNITY): Payer: Self-pay | Admitting: Physician Assistant

## 2021-06-12 VITALS — BP 146/87 | HR 84 | Ht 70.0 in | Wt 250.0 lb

## 2021-06-12 DIAGNOSIS — F1910 Other psychoactive substance abuse, uncomplicated: Secondary | ICD-10-CM | POA: Insufficient documentation

## 2021-06-12 DIAGNOSIS — F3132 Bipolar disorder, current episode depressed, moderate: Secondary | ICD-10-CM | POA: Insufficient documentation

## 2021-06-12 DIAGNOSIS — F411 Generalized anxiety disorder: Secondary | ICD-10-CM | POA: Insufficient documentation

## 2021-06-12 MED ORDER — ARIPIPRAZOLE 5 MG PO TABS
ORAL_TABLET | ORAL | 1 refills | Status: DC
  Filled 2021-06-12: qty 60, 32d supply, fill #0

## 2021-06-12 MED ORDER — NALTREXONE HCL 50 MG PO TABS
50.0000 mg | ORAL_TABLET | Freq: Every day | ORAL | 1 refills | Status: DC
  Filled 2021-06-12: qty 30, 30d supply, fill #0

## 2021-06-12 MED ORDER — BUPROPION HCL ER (XL) 150 MG PO TB24
ORAL_TABLET | ORAL | 1 refills | Status: DC
  Filled 2021-06-12: qty 60, 32d supply, fill #0

## 2021-06-12 NOTE — Progress Notes (Signed)
Psychiatric Initial Adult Assessment  ? ?Patient Identification: Raymond BattyJoseph D Mcclure ?MRN:  [id number] ?Date of Evaluation:  06/12/2021 ?Referral Source: Walk-in ?Chief Complaint:   ?Chief Complaint  ?Patient presents with  ? Medication Management  ? ?Visit Diagnosis:  ?  ICD-10-CM   ?1. Bipolar affective disorder, currently depressed, moderate (HCC)  F31.32 ARIPiprazole (ABILIFY) 5 MG tablet  ?  buPROPion (WELLBUTRIN XL) 150 MG 24 hr tablet  ?  ?2. Anxiety state  F41.1   ?  ?3. Polysubstance abuse (HCC)  F19.10 naltrexone (DEPADE) 50 MG tablet  ?  ? ? ?History of Present Illness:   ? ?Raymond BattyJoseph D. Feider is a 56 year old male with a past psychiatric history significant for bipolar disorder who [facility] for medication refill. ? ?Patient reports that he has been having suicidal thoughts due to being out of his medications.  Patient states that he has been established at a few psychiatric facilities in the past.  He reports that he was last seen at [facility] for several weeks (3 weeks) and was admitted in March for suicidal thoughts as well as substance abuse.  Patient reports that he admits to self-medicating with cocaine, fentanyl, and alcohol.  Patient reports that he has run out of the following medications: Abilify 10 mg daily, naltrexone 50 mg daily, and bupropion 300 mg daily.  Patient states that these medications keep him stable and he has been without his medications since last Wednesday. ? ?Patient endorses depression characterized by the following symptoms: low mood, lack of motivation, no energy, feelings of guilt or worthlessness, hopelessness, and no concentration.  Patient endorses anxiety and rates his anxiety as 7 out of 10.  Patient's main stressor involves being unable to work due to his health issues.  Patient reports that he was last [facility] for mental [facility] due to suicide attempt and drug overdose.  Patient's  suicide attempt was characterized by attempting to jump off a parking deck.  Patient endorses self-harm through drug use.  A PHQ-9 screen was performed with the patient scoring a 26.  A GAD-7 screen was also performed with the patient scoring a 20.  A [place] Suicide Severity Rating Scale was performed with the patient being considered high risk.  Patient denies being a danger to himself at this time and is able to contract for safety. ? ?Patient is alert and oriented x4, calm, cooperative, and fully engaged in conversation during the encounter.  Patient denies active suicidal or homicidal ideations.  He endorses auditory and visual hallucinations.  Patient's auditory hallucinations are characterized by hearing crazy noises.  His visual hallucinations are characterized by seeing shadows.  Patient does not appear to be responding to internal/external stimuli at this time.  Patient endorses poor sleep and reports getting no sleep.  Patient endorses decreased appetite and states that he often does not feel like eating much.  Patient endorses excessive alcohol consumption.  He endorses tobacco use in the form of cigarettes and cigars.  Patient states smokes on average 2 packs of cigarettes per day as well as cigars.  Patient endorses illicit drug use in the form of cocaine, fentanyl, and marijuana. ? ?Associated Signs/Symptoms: ?Depression Symptoms:  depressed mood, ?anhedonia, ?insomnia, ?psychomotor agitation, ?psychomotor retardation, ?fatigue, ?feelings of worthlessness/guilt, ?difficulty concentrating, ?hopelessness, ?impaired memory, ?recurrent thoughts of death, ?suicidal thoughts without plan, ?anxiety, ?loss of energy/fatigue, ?disturbed sleep, ?weight loss, ?decreased labido, ?decreased appetite, ?(Hypo) Manic Symptoms:  Delusions, ?Distractibility, ?  Flight of Ideas, ?Licensed conveyancer, ?Hallucinations, ?Impulsivity, ?Irritable Mood, ?Labiality of Mood, ?Anxiety Symptoms:  Agoraphobia, ?Excessive  Worry, ?Obsessive Compulsive Symptoms:   Excessively organized, ?Social Anxiety, ?Psychotic Symptoms:  Delusions, ?Hallucinations: Auditory ?Visual ?Ideas of Reference, ?Paranoia, ?PTSD Symptoms: ?Had a traumatic exposure:  Patient reports that he was locked up in prison and it was highly impactful. Patient states that the death of his father and his own child has deeply impacted him. Patient states that losing his job due to health reasons has been traumatic ?Had a traumatic exposure in the last month:  N/A ?Re-experiencing:  Flashbacks ?Intrusive Thoughts ?Hypervigilance:  Yes ?Hyperarousal:  Difficulty Concentrating ?Emotional Numbness/Detachment ?Irritability/Anger ?Sleep ?Avoidance:  Decreased Interest/Participation ?Foreshortened Future ? ?Past Psychiatric History:  ?Bipolar disorder ? ?Previous Psychotropic Medications: Yes  ? ?Substance Abuse History in the last 12 months:  Yes.   ? ?Consequences of Substance Abuse: ?Medical Consequences:  Patient reports that he was hospitalized  years ago ?Legal Consequences:  None ?Family Consequences:  Patient endorses family consequences from his drug use ?Blackouts:  None ?DT's: Patient reports that he experienced delerium tremens ?Withdrawal Symptoms:   Diaphoresis ? ?Past Medical History:  ?Past Medical History:  ?Diagnosis Date  ? Cluster headaches   ? Drug abuse (HCC)   ? Migraines   ? Obesity   ? Tachycardia   ?  ?Past Surgical History:  ?Procedure Laterality Date  ? LOOP RECORDER IMPLANT    ? WRIST SURGERY    ? nerve repair  ? ? ?Family Psychiatric History:  ?Daughter - bipolar disorder ?Grandson (97 years old) - unsure, patient states that his grandson sleeps all the time. He reports that he is very smart but he doesn't apply himself ? ?Family History: History reviewed. No pertinent family history. ? ?Social History:   ?Social History  ? ?Socioeconomic History  ? Marital status: Legally Separated  ?  Spouse name: Not on file  ? Number of children: Not on file  ?  Years of education: Not on file  ? Highest education level: Not on file  ?Occupational History  ? Not on file  ?Tobacco Use  ? Smoking status: Some Days  ?  Packs/day: 0.15  ?  Types: Cigarettes  ? Smokeless tobacco: Never  ?Vaping Use  ? Vaping Use: Never used  ?Substance and Sexual Activity  ? Alcohol use: No  ? Drug use: Yes  ?  Comment: Fentanyl  ? Sexual activity: Not on file  ?  Comment: crack  ?Other Topics Concern  ? Not on file  ?Social History Narrative  ? Not on file  ? ?Social Determinants of Health  ? ?Financial Resource Strain: Not on file  ?Food Insecurity: Not on file  ?Transportation Needs: Not on file  ?Physical Activity: Not on file  ?Stress: Not on file  ?Social Connections: Not on file  ? ? ?Additional Social History:  ?Patient is currently unemployed. Prior to becoming unemployed, patient was a Chief Financial Officer. Patient endorses housing but denies having transportation. Patient denies social support since he has been back in [place]. ? ?Allergies:   ?Allergies  ?Allergen Reactions  ? Bee Venom Anaphylaxis  ? Peanut Oil Anaphylaxis  ?  + peanut butter   ? Peanut-Containing Drug Products Anaphylaxis  ? ? ?Metabolic Disorder Labs: ?No results found for: HGBA1C, MPG ?No results found for: PROLACTIN ?Lab Results  ?Component Value Date  ? CHOL  07/15/2006  ?  147        ?ATP III CLASSIFICATION: ? <200  mg/dL   Desirable ? 621-308  mg/dL   Borderline High ? >=657    mg/dL   High  ? TRIG 84 07/15/2006  ? HDL 28 (L) 07/15/2006  ? CHOLHDL 5.3 07/15/2006  ? VLDL 17 07/15/2006  ? LDLCALC (H) 07/15/2006  ?  102        ?Total Cholesterol/HDL:CHD Risk ?Coronary Heart Disease Risk Table ?                    Men   Women ? 1/2 Average Risk   3.4   3.3  ? ?Lab Results  ?Component Value Date  ? TSH 1.695 Test methodology is 3rd generation TSH 07/14/2006  ? ? ?Therapeutic Level Labs: ?No results found for: LITHIUM ?No results found for: CBMZ ?No results found for: VALPROATE ? ?Current Medications: ?Current  Outpatient Medications  ?Medication Sig Dispense Refill  ? ARIPiprazole (ABILIFY) 5 MG tablet Take 1 tablet (5 mg total) by mouth daily for 4 days, THEN 2 tablets (10 mg total) daily. 60 tablet 1  ? buPROPion

## 2021-07-15 ENCOUNTER — Other Ambulatory Visit: Payer: Self-pay

## 2021-07-15 ENCOUNTER — Emergency Department (HOSPITAL_COMMUNITY): Payer: Commercial Managed Care - HMO

## 2021-07-15 ENCOUNTER — Emergency Department (HOSPITAL_COMMUNITY)
Admission: EM | Admit: 2021-07-15 | Discharge: 2021-07-15 | Discharge status: Against Medical Advice | Payer: Commercial Managed Care - HMO | Attending: Emergency Medicine | Admitting: Emergency Medicine

## 2021-07-15 DIAGNOSIS — R202 Paresthesia of skin: Secondary | ICD-10-CM | POA: Insufficient documentation

## 2021-07-15 DIAGNOSIS — Z5321 Procedure and treatment not carried out due to patient leaving prior to being seen by health care provider: Secondary | ICD-10-CM | POA: Diagnosis not present

## 2021-07-15 DIAGNOSIS — R079 Chest pain, unspecified: Secondary | ICD-10-CM | POA: Diagnosis present

## 2021-07-15 LAB — CBC WITH DIFFERENTIAL/PLATELET
Abs Immature Granulocytes: 0.04 10*3/uL (ref 0.00–0.07)
Basophils Absolute: 0.1 10*3/uL (ref 0.0–0.1)
Basophils Relative: 1 %
Eosinophils Absolute: 0.3 10*3/uL (ref 0.0–0.5)
Eosinophils Relative: 3 %
HCT: 49 % (ref 39.0–52.0)
Hemoglobin: 16.4 g/dL (ref 13.0–17.0)
Immature Granulocytes: 0 %
Lymphocytes Relative: 33 %
Lymphs Abs: 3.3 10*3/uL (ref 0.7–4.0)
MCH: 32.5 pg (ref 26.0–34.0)
MCHC: 33.5 g/dL (ref 30.0–36.0)
MCV: 97.2 fL (ref 80.0–100.0)
Monocytes Absolute: 0.8 10*3/uL (ref 0.1–1.0)
Monocytes Relative: 8 %
Neutro Abs: 5.5 10*3/uL (ref 1.7–7.7)
Neutrophils Relative %: 55 %
Platelets: 355 10*3/uL (ref 150–400)
RBC: 5.04 MIL/uL (ref 4.22–5.81)
RDW: 12.3 % (ref 11.5–15.5)
WBC: 10 10*3/uL (ref 4.0–10.5)
nRBC: 0 % (ref 0.0–0.2)

## 2021-07-15 LAB — BASIC METABOLIC PANEL
Anion gap: 10 (ref 5–15)
BUN: 10 mg/dL (ref 6–20)
CO2: 24 mmol/L (ref 22–32)
Calcium: 9.6 mg/dL (ref 8.9–10.3)
Chloride: 106 mmol/L (ref 98–111)
Creatinine, Ser: 1.03 mg/dL (ref 0.61–1.24)
GFR, Estimated: >60 mL/min (ref >60)
Glucose, Bld: 95 mg/dL (ref 70–99)
Potassium: 4.6 mmol/L (ref 3.5–5.1)
Sodium: 140 mmol/L (ref 135–145)

## 2021-07-15 LAB — TROPONIN I (HIGH SENSITIVITY): Troponin I (High Sensitivity): 7 ng/L (ref <18)

## 2021-07-15 NOTE — ED Notes (Signed)
Called Pt's name x2 no response. Will call one more time before removing off the floor.

## 2021-07-15 NOTE — ED Triage Notes (Signed)
Pt reports waking up this morning feeling generally weak then at 1400 started having chest pain, tongue tingling, and BLE tingling. Has been having intermittent CP since an ablation in January, but pain is worse today.

## 2021-07-15 NOTE — ED Provider Triage Note (Signed)
Emergency Medicine Provider Triage Evaluation Note  Raymond Mcclure , a 56 y.o. male  was evaluated in triage.  Pt complains of chest pain, and paresthesias in bilateral lower extremity as of today.  Reports history of tachycardia requiring ablation at [facility] otherwise denies cardiac history.  Denies associated shortness of breath, lightheadedness, or other complaints.  Review of Systems  Positive: As above Negative: As above  Physical Exam  BP 132/90 (BP Location: Left Arm)   Pulse 97   Temp 98.8 F (37.1 C) (Oral)   Resp 18   SpO2 97%  Gen:   Awake, no distress   Resp:  Normal effort  MSK:   Moves extremities without difficulty  Other:    Medical Decision Making  Medically screening exam initiated at 4:43 PM.  Appropriate orders placed.  DESHAWN WITTY was informed that the remainder of the evaluation will be completed by another provider, this initial triage assessment does not replace that evaluation, and the importance of remaining in the ED until their evaluation is complete.     Marita Kansas, PA-C 07/15/21 1644

## 2021-07-15 NOTE — ED Notes (Signed)
Pt did not answer to x4 calls. Pt removed from floor.

## 2021-07-22 ENCOUNTER — Encounter (HOSPITAL_COMMUNITY): Payer: No Payment, Other | Admitting: Physician Assistant

## 2021-07-29 ENCOUNTER — Encounter (HOSPITAL_COMMUNITY): Payer: Self-pay

## 2021-07-29 ENCOUNTER — Emergency Department (HOSPITAL_COMMUNITY)
Admission: EM | Admit: 2021-07-29 | Discharge: 2021-07-29 | Disposition: A | Discharge status: Home / Self Care | Payer: Commercial Managed Care - HMO | Attending: Emergency Medicine | Admitting: Emergency Medicine

## 2021-07-29 ENCOUNTER — Other Ambulatory Visit: Payer: Self-pay

## 2021-07-29 ENCOUNTER — Emergency Department (HOSPITAL_COMMUNITY): Payer: Commercial Managed Care - HMO

## 2021-07-29 DIAGNOSIS — R079 Chest pain, unspecified: Secondary | ICD-10-CM | POA: Diagnosis present

## 2021-07-29 DIAGNOSIS — R42 Dizziness and giddiness: Secondary | ICD-10-CM | POA: Diagnosis not present

## 2021-07-29 DIAGNOSIS — R110 Nausea: Secondary | ICD-10-CM | POA: Insufficient documentation

## 2021-07-29 DIAGNOSIS — Z91010 Allergy to peanuts: Secondary | ICD-10-CM | POA: Diagnosis not present

## 2021-07-29 DIAGNOSIS — R0789 Other chest pain: Secondary | ICD-10-CM | POA: Diagnosis not present

## 2021-07-29 LAB — COMPREHENSIVE METABOLIC PANEL
ALT: 17 U/L (ref 0–44)
AST: 20 U/L (ref 15–41)
Albumin: 3.8 g/dL (ref 3.5–5.0)
Alkaline Phosphatase: 84 U/L (ref 38–126)
Anion gap: 8 (ref 5–15)
BUN: 19 mg/dL (ref 6–20)
CO2: 20 mmol/L — ABNORMAL LOW (ref 22–32)
Calcium: 9.2 mg/dL (ref 8.9–10.3)
Chloride: 110 mmol/L (ref 98–111)
Creatinine, Ser: 0.91 mg/dL (ref 0.61–1.24)
GFR, Estimated: >60 mL/min (ref >60)
Glucose, Bld: 143 mg/dL — ABNORMAL HIGH (ref 70–99)
Potassium: 3.6 mmol/L (ref 3.5–5.1)
Sodium: 138 mmol/L (ref 135–145)
Total Bilirubin: 0.5 mg/dL (ref 0.3–1.2)
Total Protein: 7.1 g/dL (ref 6.5–8.1)

## 2021-07-29 LAB — CBC WITH DIFFERENTIAL/PLATELET
Abs Immature Granulocytes: 0.03 10*3/uL (ref 0.00–0.07)
Basophils Absolute: 0 10*3/uL (ref 0.0–0.1)
Basophils Relative: 0 %
Eosinophils Absolute: 0.2 10*3/uL (ref 0.0–0.5)
Eosinophils Relative: 2 %
HCT: 44.6 % (ref 39.0–52.0)
Hemoglobin: 15.3 g/dL (ref 13.0–17.0)
Immature Granulocytes: 0 %
Lymphocytes Relative: 35 %
Lymphs Abs: 3.5 10*3/uL (ref 0.7–4.0)
MCH: 32.4 pg (ref 26.0–34.0)
MCHC: 34.3 g/dL (ref 30.0–36.0)
MCV: 94.5 fL (ref 80.0–100.0)
Monocytes Absolute: 0.8 10*3/uL (ref 0.1–1.0)
Monocytes Relative: 8 %
Neutro Abs: 5.5 10*3/uL (ref 1.7–7.7)
Neutrophils Relative %: 55 %
Platelets: 322 10*3/uL (ref 150–400)
RBC: 4.72 MIL/uL (ref 4.22–5.81)
RDW: 12.2 % (ref 11.5–15.5)
WBC: 10 10*3/uL (ref 4.0–10.5)
nRBC: 0 % (ref 0.0–0.2)

## 2021-07-29 LAB — LIPASE, BLOOD: Lipase: 29 U/L (ref 11–51)

## 2021-07-29 LAB — TROPONIN I (HIGH SENSITIVITY)
Troponin I (High Sensitivity): 3 ng/L (ref <18)
Troponin I (High Sensitivity): 3 ng/L (ref <18)

## 2021-07-29 LAB — D-DIMER, QUANTITATIVE: D-Dimer, Quant: 0.33 ug/mL-FEU (ref 0.00–0.50)

## 2021-07-29 MED ORDER — LIDOCAINE VISCOUS HCL 2 % MT SOLN
15.0000 mL | Freq: Once | OROMUCOSAL | Status: AC
  Administered 2021-07-29: 15 mL via ORAL
  Filled 2021-07-29: qty 15

## 2021-07-29 MED ORDER — ALUM & MAG HYDROXIDE-SIMETH 200-200-20 MG/5ML PO SUSP
30.0000 mL | Freq: Once | ORAL | Status: AC
  Administered 2021-07-29: 30 mL via ORAL
  Filled 2021-07-29: qty 30

## 2021-07-29 MED ORDER — SODIUM CHLORIDE 0.9 % IV BOLUS: 1000.0000 mL | Freq: Once | INTRAVENOUS | Status: DC

## 2021-07-29 MED ORDER — FAMOTIDINE IN NACL 20-0.9 MG/50ML-% IV SOLN: 20.0000 mg | Freq: Once | INTRAVENOUS | Status: DC

## 2021-07-29 MED ORDER — MECLIZINE HCL 25 MG PO TABS: 25.0000 mg | ORAL_TABLET | Freq: Three times a day (TID) | ORAL | 0 refills | Status: DC | PRN

## 2021-07-29 MED ORDER — FAMOTIDINE 20 MG PO TABS
20.0000 mg | ORAL_TABLET | Freq: Once | ORAL | Status: AC
  Administered 2021-07-29: 20 mg via ORAL
  Filled 2021-07-29: qty 1

## 2021-07-29 NOTE — Discharge Instructions (Addendum)

## 2021-07-29 NOTE — ED Provider Notes (Signed)
[facility]-EMERGENCY DEPT Provider Note   CSN: [id number] Arrival date & time: 07/29/21  1404     History {Add pertinent medical, surgical, social history, OB history to HPI:1} Chief Complaint  Patient presents with   Chest Pain   Dizziness    Raymond Mcclure is a 56 y.o. male.  Patient as above with significant medical history as below, including cluster headaches, cocaine abuse, migraines, SVT, bipolar 1 who presents to the ED with complaint of dizziness, chest pain.  Patient reports sudden onset dizziness earlier this morning described as room spinning sensation.  He had some mild nausea with the dizziness.  Dizziness did resolve prior to arrival reports has not felt this in the past.  No current dizziness.  Patient does report chest pain for the past 3 to 4 hours described as midsternal, pressure, heaviness.  Radiation to his back.  Mild nausea without vomiting.  No diaphoresis or lightheadedness.  No acute numbness or tingling.  No change in bowel or bladder function.  Does feel pain is worsened on deep inspiration.  No medications prior to arrival.     Past Medical History:  Diagnosis Date   Cluster headaches    Drug abuse (HCC)    Migraines    Obesity    Tachycardia     Past Surgical History:  Procedure Laterality Date   LOOP RECORDER IMPLANT     WRIST SURGERY     nerve repair     The history is provided by the patient. No language interpreter was used.  Chest Pain Associated symptoms: dizziness and shortness of breath   Associated symptoms: no abdominal pain, no cough, no dysphagia, no fever, no headache, no nausea, no palpitations and no vomiting   Dizziness Associated symptoms: chest pain and shortness of breath   Associated symptoms: no headaches, no nausea, no palpitations and no vomiting        Home Medications Prior to Admission medications   Medication Sig Start Date End Date Taking? Authorizing Provider  acetaminophen (TYLENOL)  500 MG tablet Take 2,000 mg by mouth every 4 (four) hours as needed for moderate pain.    [provider]  ARIPiprazole (ABILIFY) 5 MG tablet Take 1 tablet (5 mg total) by mouth daily for 4 days, THEN 2 tablets (10 mg total) daily. 06/12/21 06/16/22  Nwoko, Tommas Olp, PA  buPROPion (WELLBUTRIN XL) 150 MG 24 hr tablet Take 1 tablet (150 mg total) by mouth every morning for 4 days, THEN 2 tablets (300 mg total) every morning. 06/12/21 06/16/22  Nwoko, Tommas Olp, PA  dicyclomine (BENTYL) 20 MG tablet Take 1 tablet (20 mg total) by mouth 2 (two) times daily as needed for spasms. Patient not taking: Reported on 01/27/2021 05/27/20   Carroll Sage, PA-C  diltiazem (CARDIZEM CD) 360 MG 24 hr capsule Take 1 capsule (360 mg total) by mouth daily. 01/27/21   Tobb, Kardie, DO  doxycycline (VIBRAMYCIN) 100 MG capsule Take 1 capsule (100 mg total) by mouth 2 (two) times daily. Patient not taking: Reported on 01/27/2021 10/03/20   Tanda Rockers A, DO  isosorbide mononitrate (IMDUR) 30 MG 24 hr tablet Take 1 tablet (30 mg total) by mouth daily. 01/27/21   Tobb, Kardie, DO  metoprolol succinate (TOPROL-XL) 25 MG 24 hr tablet Take 1 tablet (25 mg total) by mouth daily. 01/31/21 03/02/21  Cheryll Cockayne, MD  naltrexone (DEPADE) 50 MG tablet Take 1 tablet (50 mg total) by mouth daily. 06/12/21  Nwoko, Uchenna E, PA  naproxen (NAPROSYN) 500 MG tablet Take 1 tablet (500 mg total) by mouth 2 (two) times daily. Patient not taking: Reported on 01/27/2021 08/13/20   Tanda Rockers, PA-C  omeprazole (PRILOSEC) 20 MG capsule Take 1 capsule (20 mg total) by mouth daily. Patient not taking: Reported on 01/27/2021 05/27/20 01/27/21  Carroll Sage, PA-C  ondansetron (ZOFRAN) 4 MG tablet Take 1 tablet (4 mg total) by mouth every 8 (eight) hours as needed for nausea or vomiting. Patient not taking: Reported on 01/27/2021 05/27/20   Carroll Sage, PA-C      Allergies    Bee venom, Peanut oil, and Peanut-containing  drug products    Review of Systems   Review of Systems  Constitutional:  Negative for chills and fever.  HENT:  Negative for facial swelling and trouble swallowing.   Eyes:  Negative for photophobia and visual disturbance.  Respiratory:  Positive for shortness of breath. Negative for cough.   Cardiovascular:  Positive for chest pain. Negative for palpitations.  Gastrointestinal:  Negative for abdominal pain, nausea and vomiting.  Endocrine: Negative for polydipsia and polyuria.  Genitourinary:  Negative for difficulty urinating and hematuria.  Musculoskeletal:  Negative for gait problem and joint swelling.  Skin:  Negative for pallor and rash.  Neurological:  Positive for dizziness. Negative for syncope and headaches.  Psychiatric/Behavioral:  Negative for agitation and confusion.     Physical Exam Updated Vital Signs BP (!) 150/95   Pulse 66   Temp 98.1 F (36.7 C) (Oral)   Resp 11   SpO2 97%  Physical Exam Vitals and nursing note reviewed.  Constitutional:      General: He is not in acute distress.    Appearance: He is well-developed. He is not ill-appearing or diaphoretic.  HENT:     Head: Normocephalic and atraumatic.     Right Ear: External ear normal.     Left Ear: External ear normal.     Mouth/Throat:     Mouth: Mucous membranes are moist.  Eyes:     General: No scleral icterus.    Extraocular Movements: Extraocular movements intact.     Pupils: Pupils are equal, round, and reactive to light.  Cardiovascular:     Rate and Rhythm: Normal rate and regular rhythm.     Pulses: Normal pulses.     Heart sounds: Normal heart sounds.  Pulmonary:     Effort: Pulmonary effort is normal. No tachypnea or respiratory distress.     Breath sounds: Normal breath sounds. No decreased breath sounds or wheezing.  Abdominal:     General: Abdomen is flat.     Palpations: Abdomen is soft.     Tenderness: There is no abdominal tenderness.  Musculoskeletal:        General: Normal  range of motion.     Cervical back: Normal range of motion.     Right lower leg: No edema.     Left lower leg: No edema.  Skin:    General: Skin is warm and dry.     Capillary Refill: Capillary refill takes less than 2 seconds.  Neurological:     Mental Status: He is alert and oriented to person, place, and time.     GCS: GCS eye subscore is 4. GCS verbal subscore is 5. GCS motor subscore is 6.     Cranial Nerves: Cranial nerves 2-12 are intact. No dysarthria or facial asymmetry.     Sensory: Sensation is intact.  Motor: Motor function is intact.     Coordination: Coordination is intact. Finger-Nose-Finger Test normal.     Gait: Gait is intact.  Psychiatric:        Mood and Affect: Mood normal.        Behavior: Behavior normal.     ED Results / Procedures / Treatments   Labs (all labs ordered are listed, but only abnormal results are displayed) Labs Reviewed  COMPREHENSIVE METABOLIC PANEL - Abnormal; Notable for the following components:      Result Value   CO2 20 (*)    Glucose, Bld 143 (*)    All other components within normal limits  CBC WITH DIFFERENTIAL/PLATELET  LIPASE, BLOOD  D-DIMER, QUANTITATIVE  TROPONIN I (HIGH SENSITIVITY)  TROPONIN I (HIGH SENSITIVITY)    EKG EKG Interpretation  Date/Time:  Wednesday July 29 2021 14:45:19 EDT Ventricular Rate:  83 PR Interval:  172 QRS Duration: 82 QT Interval:  374 QTC Calculation: 440 R Axis:   53 Text Interpretation: Sinus rhythm since last tracing no significant change Confirmed by Mancel Bale [phone]) on 07/29/2021 4:39:23 PM  Radiology DG Chest 1 View  Result Date: 07/29/2021 CLINICAL DATA:  Dizziness and nausea EXAM: CHEST  1 VIEW COMPARISON:  07/15/2021 FINDINGS: The heart size and mediastinal contours are within normal limits. Both lungs are clear. The visualized skeletal structures are unremarkable. IMPRESSION: No active disease. Electronically Signed   By: Jasmine Pang M.D.   On: 07/29/2021 15:12     Procedures Procedures  {Document cardiac monitor, telemetry assessment procedure when appropriate:1}  Medications Ordered in ED Medications  famotidine (PEPCID) IVPB 20 mg premix (has no administration in time range)  alum & mag hydroxide-simeth (MAALOX/MYLANTA) 200-200-20 MG/5ML suspension 30 mL (has no administration in time range)    And  lidocaine (XYLOCAINE) 2 % viscous mouth solution 15 mL (has no administration in time range)  sodium chloride 0.9 % bolus 1,000 mL (has no administration in time range)    ED Course/ Medical Decision Making/ A&P                           Medical Decision Making Amount and/or Complexity of Data Reviewed Labs: ordered.  Risk OTC drugs. Prescription drug management.    CC: cp, dizzy (resolved)  This patient presents to the Emergency Department for the above complaint. This involves an extensive number of treatment options and is a complaint that carries with it a high risk of complications and morbidity. Vital signs were reviewed. Serious etiologies considered.  Differential includes all life-threatening causes for chest pain. This includes but is not exclusive to acute coronary syndrome, aortic dissection, pulmonary embolism, cardiac tamponade, community-acquired pneumonia, pericarditis, musculoskeletal chest wall pain, etc.   Record review:  Previous records obtained and reviewed prior ED visits, prior labs and imaging  Additional history obtained from N/A  Medical and surgical history as noted above.   Work up as above, notable for:  Labs & imaging results that were available during my care of the patient were visualized by me and considered in my medical decision making.   I ordered imaging studies which included ***. I visualized the imaging, interpreted images, and I agree with radiologist interpretation. ***  Cardiac monitoring reviewed and interpreted personally which shows ***   Personally discussed patient care with  consultant; ***  Management: ***  ED Course:     Reassessment:  ***  Admission was considered.  Social determinants of health include -  Social History   Socioeconomic History   Marital status: Legally Separated    Spouse name: Not on file   Number of children: Not on file   Years of education: Not on file   Highest education level: Not on file  Occupational History   Not on file  Tobacco Use   Smoking status: Some Days    Packs/day: 0.15    Types: Cigarettes   Smokeless tobacco: Never  Vaping Use   Vaping Use: Never used  Substance and Sexual Activity   Alcohol use: No   Drug use: Yes    Comment: Fentanyl   Sexual activity: Not on file    Comment: crack  Other Topics Concern   Not on file  Social History Narrative   Not on file   Social Determinants of Health   Financial Resource Strain: Not on file  Food Insecurity: Not on file  Transportation Needs: Not on file  Physical Activity: Not on file  Stress: Not on file  Social Connections: Not on file  Intimate Partner Violence: Not on file      This chart was dictated using voice recognition software.  Despite best efforts to proofread,  errors can occur which can change the documentation meaning.   {Document critical care time when appropriate:1} {Document review of labs and clinical decision tools ie heart score, Chads2Vasc2 etc:1}  {Document your independent review of radiology images, and any outside records:1} {Document your discussion with family members, caretakers, and with consultants:1} {Document social determinants of health affecting pt's care:1} {Document your decision making why or why not admission, treatments were needed:1} Final Clinical Impression(s) / ED Diagnoses Final diagnoses:  None    Rx / DC Orders ED Discharge Orders     None

## 2021-07-29 NOTE — ED Provider Triage Note (Signed)
Emergency Medicine Provider Triage Evaluation Note  Raymond Mcclure , a 56 y.o. male  was evaluated in triage.  Pt complains of dizziness.  States that this morning he woke up and noticed that he got extremely dizzy when he got up from bed.  He is continued to be dizzy since then.  He feels like he is going to pass out.  He also notes some right-sided chest pain that he does have chronically and is supposed to be seeing a cardiologist for. Chest pain worse with movements. He says he noticed it today at the same time and also some back pain with radiating radicular sx down his right leg. He feels like he has pain and numbness in his right foot.   Review of Systems  Positive: Dizziness, chest pain, shortness of breath, numbness, back pain Negative:   Physical Exam  BP 132/89 (BP Location: Left Arm)   Pulse 86   Temp 98.1 F (36.7 C) (Oral)   Resp 13   SpO2 99%  Gen:   Awake, no distress   Resp:  Normal effort  MSK:   Moves extremities without difficulty  Other:   5 out of 5 strength in all extremities.  Normal sensation in all extremities.  No facial droop noted.  Speech normal.  EOMs intact.  Pupils round equal and reactive. Patient made sudden movement to the right and had worsening right chest pain.  It is not reproducible to palpation.  He also has reproducible lower back pain to palpation.  Medical Decision Making  Medically screening exam initiated at 2:41 PM.  Appropriate orders placed.  Raymond Mcclure was informed that the remainder of the evaluation will be completed by another provider, this initial triage assessment does not replace that evaluation, and the importance of remaining in the ED until their evaluation is complete.     Claudie Leach, PA-C 07/29/21 1443

## 2021-07-29 NOTE — ED Notes (Signed)
Pt ambulated to BR with no difficulty noted or reported, no assistance needed

## 2021-07-29 NOTE — ED Triage Notes (Signed)
Pt states he awoke this morning with extreme dizziness and nausea. Denies head injury/LOC. Pt also c/o chronic R sided CP and SOB.

## 2021-07-30 ENCOUNTER — Telehealth: Payer: Self-pay

## 2021-07-30 NOTE — Telephone Encounter (Signed)
RNCM received TOC consult for PCP needs. RNCM spoke with patient who reports needs a PCP. Patient reports having medical insurance with [place].Patient reports he will have [place] and [employer] eftective 08/01/21. Patient reports needing a PCP with [facility] due to being located near a bus stop. This RNCM will call to schedule an PCP appointment after 1:30pm once [facility] community care clinics re-open after lunch.   TOC will continue to follow.

## 2021-07-30 NOTE — Telephone Encounter (Signed)
RNCM spoke with patient to advise of his upcoming ED follow up appt:  [facility] Date/ Time: Thursday 08/13/21 at 10:45 am Address: [address] [place], [place] Entrance A, located inside [facility] on the [employer] Phone: [phone]

## 2021-08-05 ENCOUNTER — Other Ambulatory Visit: Payer: Self-pay

## 2021-08-07 ENCOUNTER — Other Ambulatory Visit: Payer: Self-pay

## 2021-08-13 ENCOUNTER — Ambulatory Visit (INDEPENDENT_AMBULATORY_CARE_PROVIDER_SITE_OTHER): Payer: Commercial Managed Care - HMO | Admitting: Student

## 2021-08-13 ENCOUNTER — Other Ambulatory Visit: Payer: Self-pay

## 2021-08-13 ENCOUNTER — Encounter: Payer: Self-pay | Admitting: Student

## 2021-08-13 VITALS — BP 130/90 | HR 70 | Temp 98.0°F | Ht 70.0 in | Wt 250.8 lb

## 2021-08-13 DIAGNOSIS — F411 Generalized anxiety disorder: Secondary | ICD-10-CM | POA: Diagnosis not present

## 2021-08-13 DIAGNOSIS — F3132 Bipolar disorder, current episode depressed, moderate: Secondary | ICD-10-CM | POA: Diagnosis not present

## 2021-08-13 DIAGNOSIS — R45851 Suicidal ideations: Secondary | ICD-10-CM

## 2021-08-13 NOTE — Patient Instructions (Addendum)
Thank you, [name] for allowing [place] to provide your care today.  We discussed your concern for mental health. I am concerned and would like to make sure you are safe.  We discussed your plan to return home and spend time with your grandchildren, take the train at 3:45pm today to [place], spend the night with your wife and go to your appointment at [facility] tomorrow morning for checkup and medication refill. I will call you later this afternoon and tomorrow morning to check in.   [facility]: [phone]). Please reach out to them this week or next week to assist [place] setting up appointment.  We will follow-up at this clinic in 1 week to check in and address your other health conditions.  If you feel like you may hurt yourself or others, or have thoughts about taking your own life, please get help right away. Here are some resources available to you:  -Call your local emergency services (911) -[facility] [address] health and human services helpline (211) -Go to the nearest emergency room. -Call a suicide hotline to talk to a trained counselor.  1-800-273-TALK 1-800-SUICIDE  988 (This is a crisis hotline you may call or text)   Follow up:  1 week    Remember: Please reach out to [place] or local emergency services if you have worsening suicidal thoughts.   Should you have any questions or concerns please call the internal medicine clinic at [phone].    Rana Snare, D.O. [facility]

## 2021-08-13 NOTE — Progress Notes (Addendum)
CC: Suicidal Ideation  HPI:  Raymond Mcclure is a 56 y.o. male living with a history stated below and presents today for suicidal ideation. Please see problem based assessment and plan for additional details.  Past Medical History:  Diagnosis Date   Cluster headaches    Drug abuse (HCC)    Migraines    Obesity    Tachycardia     Current Outpatient Medications on File Prior to Visit  Medication Sig Dispense Refill   acetaminophen (TYLENOL) 500 MG tablet Take 2,000 mg by mouth every 4 (four) hours as needed for moderate pain.     ARIPiprazole (ABILIFY) 5 MG tablet Take 1 tablet (5 mg total) by mouth daily for 4 days, THEN 2 tablets (10 mg total) daily. 60 tablet 1   buPROPion (WELLBUTRIN XL) 150 MG 24 hr tablet Take 1 tablet (150 mg total) by mouth every morning for 4 days, THEN 2 tablets (300 mg total) every morning. 60 tablet 1   dicyclomine (BENTYL) 20 MG tablet Take 1 tablet (20 mg total) by mouth 2 (two) times daily as needed for spasms. (Patient not taking: Reported on 01/27/2021) 20 tablet 0   diltiazem (CARDIZEM CD) 360 MG 24 hr capsule Take 1 capsule (360 mg total) by mouth daily. 90 capsule 3   doxycycline (VIBRAMYCIN) 100 MG capsule Take 1 capsule (100 mg total) by mouth 2 (two) times daily. (Patient not taking: Reported on 01/27/2021) 20 capsule 0   isosorbide mononitrate (IMDUR) 30 MG 24 hr tablet Take 1 tablet (30 mg total) by mouth daily. 90 tablet 3   meclizine (ANTIVERT) 25 MG tablet Take 1 tablet (25 mg total) by mouth 3 (three) times daily as needed for dizziness. 10 tablet 0   metoprolol succinate (TOPROL-XL) 25 MG 24 hr tablet Take 1 tablet (25 mg total) by mouth daily. 30 tablet 0   naltrexone (DEPADE) 50 MG tablet Take 1 tablet (50 mg total) by mouth daily. 30 tablet 1   naproxen (NAPROSYN) 500 MG tablet Take 1 tablet (500 mg total) by mouth 2 (two) times daily. (Patient not taking: Reported on 01/27/2021) 30 tablet 0   omeprazole (PRILOSEC) 20 MG capsule Take 1  capsule (20 mg total) by mouth daily. (Patient not taking: Reported on 01/27/2021) 30 capsule 0   ondansetron (ZOFRAN) 4 MG tablet Take 1 tablet (4 mg total) by mouth every 8 (eight) hours as needed for nausea or vomiting. (Patient not taking: Reported on 01/27/2021) 12 tablet 0   No current facility-administered medications on file prior to visit.    Review of Systems: ROS negative except for what is noted on the assessment and plan.  Vitals:   08/13/21 1013  BP: 130/90  Pulse: 70  Temp: 98 F (36.7 C)  TempSrc: Oral  SpO2: 100%  Weight: 250 lb 12.8 oz (113.8 kg)  Height: 5\' 10"  (1.778 m)   Physical Exam Constitutional:      General: He is not in acute distress.    Appearance: Normal appearance. He is not ill-appearing.  Neurological:     Mental Status: He is alert.  Psychiatric:        Mood and Affect: Mood is anxious and depressed.        Speech: Speech normal.        Behavior: Behavior is cooperative.        Thought Content: Thought content includes suicidal ideation. Thought content does not include homicidal ideation. Thought content does not include suicidal plan.  Assessment & Plan:   Bipolar affective disorder, currently depressed, moderate (HCC) Patient reports chronic suicidal thoughts daily. He states 3 suicidal attempts, the last one being earlier this year.  The last attempt he tried to take opioids and was admitted to [facility] for treatment. He reports fatigue, depressed mood, anxiety. This past Sunday he reports unable to get out of bed. He is currently living and caring for his mother.  He states "I am tired". He does not have a concrete suicidal plan. He feels like he is not at the level of when he previously attempted suicide. Denies any weapons on hand or at the house.   His support system includes his mother, wife, daughters, and grandchildren.  He reports he talks to his daughters when he is stressed or in a crisis.  His wife is currently living in  [place].  Spending time with his grandchildren helps him from isolating himself. He states "I change and play with my youngest granddaughter when she comes to the house". He believes that his mother, kids and grandchildren need him to be here and not end his life.   Stressors include his chronic conditions, mother's health and the wellbeing of his children and grandchildren. The most recent stressor was being denied for disability.  He reports not taking his Abilify or Wellbutrin for the past few weeks. He has an appointment at [facility] tomorrow morning.  His plan today is to go home and spend time with his grandchildren, take the train to [place], spend the night with his wife in [place], then go to his appointment in the morning at [facility].  He does not have mental health care established here in [place]. Would like to establish PCP here at the clinic.  Dr. Lafonda Mosses and I discussed thoroughly with the patient about our concerns and his safety. Reinforced that his family is here. Provided several resources for mental health care and suicide crisis helpline. He feels safe and stable today to continue with his appointment at [facility]. We will closely monitor and contact him for updates.   Plan -will call him this afternoon and tomorrow morning to assess his condition -provided [facility] phone number -provided suicide crisis helpline and resources in his summary packet -f/u in 1 week for recheck and establish care at clinic to address other health needs  *7/13 at 4:40 pm: Made phone call to patient. He reports he is on the train now to [place]. He feels fine with no questions or concerns at this time. He states he is "enjoying the movie". I stated I will try calling him mid-morning after his appointment at [facility]. Patient agrees and understands.   ADDENDUM: 7/14 11:50 am: Made phone call to patient. He reports that his appointment at [facility] was  cancelled. He is back in [place] with his daughters and grandchildren. States "feeling good" right now and the trip was "good break from [place]". Denies SI at this time. F/u appointment with [facility] on 7/20. Advised pt to call [facility] today to set up care and reminded him of the resources in case of a crisis.    Patient seen with Dr. Halina Andreas, D.O. [facility], PGY-1 Phone: [phone] Date 08/14/2021 Time 12:05 PM

## 2021-08-13 NOTE — Assessment & Plan Note (Addendum)
Patient reports chronic suicidal thoughts daily. He states 3 suicidal attempts, the last one being earlier this year.  The last attempt he tried to take opioids and was admitted to [facility] for treatment. He reports fatigue, depressed mood, anxiety. This past Sunday he reports unable to get out of bed. He is currently living and caring for his mother.  He states "I am tired". He does not have a concrete suicidal plan. He feels like he is not at the level of when he previously attempted suicide. Denies any weapons on hand or at the house.   His support system includes his mother, wife, daughters, and grandchildren.  He reports he talks to his daughters when he is stressed or in a crisis.  His wife is currently living in [place].  Spending time with his grandchildren helps him from isolating himself. He states "I change and play with my youngest granddaughter when she comes to the house". He believes that his mother, kids and grandchildren need him to be here and not end his life.   Stressors include his chronic conditions, mother's health and the wellbeing of his children and grandchildren. The most recent stressor was being denied for disability.  He reports not taking his Abilify or Wellbutrin for the past few weeks. He has an appointment at [facility] tomorrow morning.  His plan today is to go home and spend time with his grandchildren, take the train to [place], spend the night with his wife in [place], then go to his appointment in the morning at [facility].  He does not have mental health care established here in [place]. Would like to establish PCP here at the clinic.  Dr. Lafonda Mosses and I discussed thoroughly with the patient about our concerns and his safety. Reinforced that his family is here. Provided several resources for mental health care and suicide crisis helpline. He feels safe and stable today to continue with his appointment at [facility]. We will closely monitor and contact  him for updates.   Plan -will call him this afternoon and tomorrow morning to assess his condition -provided [facility] [employer] [facility] phone number -provided suicide crisis helpline and resources in his summary packet -f/u in 1 week for recheck and establish care at clinic to address other health needs  *7/13 at 4:40 pm: Made phone call to patient. He reports he is on the train now to [place]. He feels fine with no questions or concerns at this time. He states he is "enjoying the movie". I stated I will try calling him mid-morning after his appointment at [facility]. Patient agrees and understands.   ADDENDUM: 7/14 11:50 am: Made phone call to patient. He reports that his appointment at [facility] was cancelled. He is back in [place] with his daughters and grandchildren. States "feeling good" right now and the trip was "good break from [place]". Denies SI at this time. F/u appointment with [facility] on 7/20. Advised pt to call [facility] today to set up care and reminded him of the resources in case of a crisis.

## 2021-08-14 ENCOUNTER — Other Ambulatory Visit (HOSPITAL_COMMUNITY): Payer: Self-pay | Admitting: Physician Assistant

## 2021-08-14 ENCOUNTER — Telehealth: Payer: Self-pay

## 2021-08-14 DIAGNOSIS — F1910 Other psychoactive substance abuse, uncomplicated: Secondary | ICD-10-CM

## 2021-08-14 DIAGNOSIS — F3132 Bipolar disorder, current episode depressed, moderate: Secondary | ICD-10-CM

## 2021-08-14 NOTE — Telephone Encounter (Signed)
Sawana with [facility] pharmacy requesting refill on diltiazem (CARDIZEM CD) 360 MG 24 hr capsule, isosorbide mononitrate (IMDUR) 30 MG 24 hr tablet and  meclizine (ANTIVERT) 25 MG tablet. Please call the pharmacy back @ [phone].

## 2021-08-14 NOTE — Progress Notes (Signed)
Internal Medicine Clinic Attending  I saw and evaluated the patient.  I personally confirmed the key portions of the history and exam documented by Dr. Sherrilee Gilles and I reviewed pertinent patient test results.  The assessment, diagnosis, and plan were formulated together and I agree with the documentation in the resident's note.    Patient with major depressive disorder, chronic suicidal ideation, and prior suicide attempts who presents to establish care with [place] in primary care clinic today. We focused on his mental health today. He frequently has thoughts that he'd be better off dead, and has been off of his psychiatric medicines for several weeks, which is very concerning. He has an appointment with [facility] tomorrow (7/14), and feels that he will be safe to get to that appointment (he'll see his daughter & grandkids later this morning, then get on train to [facility] this afternoon, and stay with his wife overnight, then go to [facility] tomorrow). He has no current plan to harm himself.  For the short term, I agree that getting to [facility] to get his medicines refilled is the top priority. For the medium/long term, we will work to get him established with [facility] here in [place]. He had difficulty with the walk-in clinic, so we provided the contact info for [facility] to try that instead. And we'll follow up with him next week (appointment is scheduled) to discuss his chronic medical problems (HTN, etc) in more detail, as this was not the focus of today's visit.

## 2021-08-17 ENCOUNTER — Encounter (HOSPITAL_COMMUNITY): Payer: Self-pay

## 2021-08-17 ENCOUNTER — Ambulatory Visit (HOSPITAL_COMMUNITY)
Admission: EM | Admit: 2021-08-17 | Discharge: 2021-08-17 | Disposition: A | Discharge status: Home / Self Care | Payer: Commercial Managed Care - HMO | Attending: Physician Assistant | Admitting: Physician Assistant

## 2021-08-17 ENCOUNTER — Other Ambulatory Visit: Payer: Self-pay

## 2021-08-17 DIAGNOSIS — R112 Nausea with vomiting, unspecified: Secondary | ICD-10-CM

## 2021-08-17 DIAGNOSIS — K529 Noninfective gastroenteritis and colitis, unspecified: Secondary | ICD-10-CM

## 2021-08-17 DIAGNOSIS — R1013 Epigastric pain: Secondary | ICD-10-CM

## 2021-08-17 MED ORDER — ONDANSETRON HCL 4 MG PO TABS
4.0000 mg | ORAL_TABLET | Freq: Three times a day (TID) | ORAL | 0 refills | Status: DC | PRN
  Filled 2021-08-17: qty 20, 7d supply, fill #0

## 2021-08-17 MED ORDER — ONDANSETRON 4 MG PO TBDP
4.0000 mg | ORAL_TABLET | Freq: Once | ORAL | Status: AC
  Administered 2021-08-17: 4 mg via ORAL

## 2021-08-17 MED ORDER — OMEPRAZOLE 20 MG PO CPDR
20.0000 mg | DELAYED_RELEASE_CAPSULE | Freq: Every day | ORAL | 0 refills | Status: DC
  Filled 2021-08-17: qty 30, 30d supply, fill #0

## 2021-08-17 MED ORDER — ONDANSETRON 4 MG PO TBDP
ORAL_TABLET | ORAL | Status: AC
  Filled 2021-08-17: qty 1

## 2021-08-17 NOTE — ED Triage Notes (Signed)
Pt states n/v/d and abdominal distention since 4am Sunday morning. States able to drink liquids.

## 2021-08-17 NOTE — ED Provider Notes (Signed)
[facility]    CSN: [id number] Arrival date & time: 08/17/21  2703      History   Chief Complaint Chief Complaint  Patient presents with   Abdominal Pain   Emesis    HPI Raymond Mcclure is a 56 y.o. male.   35-year-old male presents with nausea, vomiting, and diarrhea.  Patient relates that early Sunday morning he woke up around 4 AM with abdominal pain, cramping, nausea and repeated vomiting.  Patient indicates that he also had diarrhea associated, loose, watery and frequent with a greenish color.  Patient indicates that he has continued to have nausea and abdominal cramping over the past 12 hours.  Patient indicates he did take some Tums which gave him mild relief.  Patient indicates he has not had any fever or chills, and he has not been around any friends or family that have had similar symptoms.  Patient does indicate that he ate late Saturday evening with some [place] spaghetti which did not exactly taste right.  Patient is concerned that this may be part of the cause of his illness.  Patient also indicates for many years he has had the continued sensation that food gets stuck in the lower part of the stomach and esophagus.  Patient indicates that he he can drink fluids without the sensation but he does get the sensation with most foods.  Patient indicates he was taking Prilosec 20 mg once a day for indigestion or heartburn however he changed his diet and most of the symptoms have improved.  Patient indicates he did see a GI specialist about 4 years ago and an endoscopy was performed and that when he was put on the Prilosec.  Patient also indicates that at that time he also had the same feeling but the exam only revealed the dyspepsia.  Patient is concerned about his continued feeling that food gets stuck.   Abdominal Pain Associated symptoms: diarrhea, nausea and vomiting   Emesis Associated symptoms: abdominal pain and diarrhea     Past Medical History:  Diagnosis Date    Cluster headaches    Drug abuse (HCC)    Migraines    Obesity    Tachycardia     Patient Active Problem List   Diagnosis Date Noted   Bipolar affective disorder, currently depressed, moderate (HCC) 06/12/2021   Anxiety state 06/12/2021   Polysubstance abuse (HCC) 06/12/2021   Encounter to establish care 01/27/2021   SOB (shortness of breath) 01/27/2021   SVT (supraventricular tachycardia) (HCC) 01/27/2021   Chest pain of uncertain etiology 01/27/2021   Mild CAD 01/27/2021   Dyslipidemia 01/27/2021   Obesity (BMI 30-39.9) 01/27/2021    Past Surgical History:  Procedure Laterality Date   LOOP RECORDER IMPLANT     WRIST SURGERY     nerve repair       Home Medications    Prior to Admission medications   Medication Sig Start Date End Date Taking? Authorizing Provider  ondansetron (ZOFRAN) 4 MG tablet Take 1 tablet (4 mg total) by mouth every 8 (eight) hours as needed for nausea or vomiting. 08/17/21  Yes Ellsworth Lennox, PA-C  acetaminophen (TYLENOL) 500 MG tablet Take 2,000 mg by mouth every 4 (four) hours as needed for moderate pain.    [provider]  ARIPiprazole (ABILIFY) 5 MG tablet Take 1 tablet (5 mg total) by mouth daily for 4 days, THEN 2 tablets (10 mg total) daily. 06/12/21 06/16/22  Nwoko, Tommas Olp, PA  buPROPion (WELLBUTRIN XL)  150 MG 24 hr tablet Take 1 tablet (150 mg total) by mouth every morning for 4 days, THEN 2 tablets (300 mg total) every morning. 06/12/21 06/16/22  Nwoko, Tommas Olp, PA  dicyclomine (BENTYL) 20 MG tablet Take 1 tablet (20 mg total) by mouth 2 (two) times daily as needed for spasms. Patient not taking: Reported on 01/27/2021 05/27/20   Carroll Sage, PA-C  diltiazem (CARDIZEM CD) 360 MG 24 hr capsule Take 1 capsule (360 mg total) by mouth daily. 01/27/21   Tobb, Kardie, DO  doxycycline (VIBRAMYCIN) 100 MG capsule Take 1 capsule (100 mg total) by mouth 2 (two) times daily. Patient not taking: Reported on 01/27/2021 10/03/20   Tanda Rockers A, DO  isosorbide mononitrate (IMDUR) 30 MG 24 hr tablet Take 1 tablet (30 mg total) by mouth daily. 01/27/21   Tobb, Kardie, DO  meclizine (ANTIVERT) 25 MG tablet Take 1 tablet (25 mg total) by mouth 3 (three) times daily as needed for dizziness. 07/29/21   Sloan Leiter, DO  metoprolol succinate (TOPROL-XL) 25 MG 24 hr tablet Take 1 tablet (25 mg total) by mouth daily. 01/31/21 03/02/21  Cheryll Cockayne, MD  naltrexone (DEPADE) 50 MG tablet Take 1 tablet (50 mg total) by mouth daily. 06/12/21   Nwoko, Tommas Olp, PA  naproxen (NAPROSYN) 500 MG tablet Take 1 tablet (500 mg total) by mouth 2 (two) times daily. Patient not taking: Reported on 01/27/2021 08/13/20   Tanda Rockers, PA-C  omeprazole (PRILOSEC) 20 MG capsule Take 1 capsule (20 mg total) by mouth daily. 08/17/21 09/16/21  Ellsworth Lennox, PA-C    Family History History reviewed. No pertinent family history.  Social History Social History   Tobacco Use   Smoking status: Some Days    Types: Cigars   Smokeless tobacco: Never  Vaping Use   Vaping Use: Never used  Substance Use Topics   Alcohol use: No   Drug use: Not Currently    Comment: Fentanyl     Allergies   Bee venom, Peanut oil, and Peanut-containing drug products   Review of Systems Review of Systems  Gastrointestinal:  Positive for abdominal pain, diarrhea, nausea and vomiting.     Physical Exam Triage Vital Signs ED Triage Vitals [08/17/21 1037]  Enc Vitals Group     BP 139/84     Pulse Rate 91     Resp 18     Temp 98.1 F (36.7 C)     Temp Source Oral     SpO2 100 %     Weight      Height      Head Circumference      Peak Flow      Pain Score 7     Pain Loc      Pain Edu?      Excl. in GC?    No data found.  Updated Vital Signs BP 139/84 (BP Location: Left Arm)   Pulse 91   Temp 98.1 F (36.7 C) (Oral)   Resp 18   SpO2 100%   Visual Acuity Right Eye Distance:   Left Eye Distance:   Bilateral Distance:    Right Eye Near:   Left  Eye Near:    Bilateral Near:     Physical Exam Constitutional:      Appearance: He is well-developed.  Cardiovascular:     Rate and Rhythm: Normal rate and regular rhythm.     Heart sounds: Normal heart sounds.  Pulmonary:  Effort: Pulmonary effort is normal.     Breath sounds: Normal breath sounds and air entry. No wheezing, rhonchi or rales.  Abdominal:     General: Abdomen is flat. Bowel sounds are normal.     Palpations: Abdomen is soft.     Tenderness: There is generalized abdominal tenderness. There is no guarding or rebound.  Lymphadenopathy:     Cervical: No cervical adenopathy.  Neurological:     Mental Status: He is alert.      UC Treatments / Results  Labs (all labs ordered are listed, but only abnormal results are displayed) Labs Reviewed - No data to display  EKG   Radiology No results found.  Procedures Procedures (including critical care time)  Medications Ordered in UC Medications  ondansetron (ZOFRAN-ODT) disintegrating tablet 4 mg (has no administration in time range)    Initial Impression / Assessment and Plan / UC Course  I have reviewed the triage vital signs and the nursing notes.  Pertinent labs & imaging results that were available during my care of the patient were reviewed by me and considered in my medical decision making (see chart for details).    Plan: 1.  Advised a bland diet for the next 48 hours until symptoms improve. 2.  Advised to take the Zofran every 6 hours as needed for nausea to help control the symptoms. 3.  Patient has been given an internal referral to [facility] to have his symptoms addressed of the feeling that food gets stuck in the stomach and lower esophagus. 4.  Patient advised follow-up PCP or return to urgent care if symptoms fail to improve. Final Clinical Impressions(s) / UC Diagnoses   Final diagnoses:  Nausea vomiting and diarrhea  Gastroenteritis  Epigastric pain     Discharge Instructions       Advised to take the Zofran every 6 hours as needed for nausea and vomiting. Advised to restart the Prilosec 20 mg daily to help control the dyspepsia. Made an internal referral to GI and they should be calling you within the next 48 hours to set up an appointment.  This will be to evaluate the dyspepsia and the feeling that food is getting stuck in the lower esophagus and stomach. Increase your fluid intake over the next several days and follow-up with PCP or return if symptoms fail to improve.    ED Prescriptions     Medication Sig Dispense Auth. Provider   omeprazole (PRILOSEC) 20 MG capsule Take 1 capsule (20 mg total) by mouth daily. 30 capsule Ellsworth Lennox, PA-C   ondansetron (ZOFRAN) 4 MG tablet Take 1 tablet (4 mg total) by mouth every 8 (eight) hours as needed for nausea or vomiting. 20 tablet Ellsworth Lennox, PA-C      PDMP not reviewed this encounter.   Ellsworth Lennox, PA-C 08/17/21 1105

## 2021-08-17 NOTE — Discharge Instructions (Addendum)
Advised to take the Zofran every 6 hours as needed for nausea and vomiting. Advised to restart the Prilosec 20 mg daily to help control the dyspepsia. Made an internal referral to GI and they should be calling you within the next 48 hours to set up an appointment.  This will be to evaluate the dyspepsia and the feeling that food is getting stuck in the lower esophagus and stomach. Increase your fluid intake over the next several days and follow-up with PCP or return if symptoms fail to improve.

## 2021-08-19 ENCOUNTER — Other Ambulatory Visit (HOSPITAL_COMMUNITY): Payer: Self-pay | Admitting: Physician Assistant

## 2021-08-19 DIAGNOSIS — F3132 Bipolar disorder, current episode depressed, moderate: Secondary | ICD-10-CM

## 2021-08-20 ENCOUNTER — Encounter: Payer: Self-pay | Admitting: Student

## 2021-08-20 ENCOUNTER — Ambulatory Visit (INDEPENDENT_AMBULATORY_CARE_PROVIDER_SITE_OTHER): Payer: Self-pay | Admitting: Student

## 2021-08-20 ENCOUNTER — Other Ambulatory Visit: Payer: Self-pay

## 2021-08-20 ENCOUNTER — Emergency Department (HOSPITAL_COMMUNITY)
Admission: EM | Admit: 2021-08-20 | Discharge: 2021-08-21 | Disposition: A | Discharge status: Home / Self Care | Payer: Self-pay | Attending: Emergency Medicine | Admitting: Emergency Medicine

## 2021-08-20 DIAGNOSIS — F1414 Cocaine abuse with cocaine-induced mood disorder: Secondary | ICD-10-CM | POA: Diagnosis present

## 2021-08-20 DIAGNOSIS — R45851 Suicidal ideations: Secondary | ICD-10-CM | POA: Diagnosis not present

## 2021-08-20 DIAGNOSIS — F17290 Nicotine dependence, other tobacco product, uncomplicated: Secondary | ICD-10-CM | POA: Diagnosis not present

## 2021-08-20 DIAGNOSIS — F1494 Cocaine use, unspecified with cocaine-induced mood disorder: Secondary | ICD-10-CM

## 2021-08-20 DIAGNOSIS — Z20822 Contact with and (suspected) exposure to covid-19: Secondary | ICD-10-CM | POA: Insufficient documentation

## 2021-08-20 DIAGNOSIS — F1910 Other psychoactive substance abuse, uncomplicated: Secondary | ICD-10-CM | POA: Diagnosis not present

## 2021-08-20 DIAGNOSIS — R441 Visual hallucinations: Secondary | ICD-10-CM | POA: Insufficient documentation

## 2021-08-20 DIAGNOSIS — Z91010 Allergy to peanuts: Secondary | ICD-10-CM | POA: Diagnosis not present

## 2021-08-20 DIAGNOSIS — F3132 Bipolar disorder, current episode depressed, moderate: Secondary | ICD-10-CM | POA: Diagnosis present

## 2021-08-20 LAB — CBC WITH DIFFERENTIAL/PLATELET
Abs Immature Granulocytes: 0.01 10*3/uL (ref 0.00–0.07)
Basophils Absolute: 0 10*3/uL (ref 0.0–0.1)
Basophils Relative: 0 %
Eosinophils Absolute: 0.3 10*3/uL (ref 0.0–0.5)
Eosinophils Relative: 3 %
HCT: 43.5 % (ref 39.0–52.0)
Hemoglobin: 14.9 g/dL (ref 13.0–17.0)
Immature Granulocytes: 0 %
Lymphocytes Relative: 34 %
Lymphs Abs: 3.1 10*3/uL (ref 0.7–4.0)
MCH: 32.2 pg (ref 26.0–34.0)
MCHC: 34.3 g/dL (ref 30.0–36.0)
MCV: 94 fL (ref 80.0–100.0)
Monocytes Absolute: 0.7 10*3/uL (ref 0.1–1.0)
Monocytes Relative: 8 %
Neutro Abs: 4.8 10*3/uL (ref 1.7–7.7)
Neutrophils Relative %: 55 %
Platelets: 313 10*3/uL (ref 150–400)
RBC: 4.63 MIL/uL (ref 4.22–5.81)
RDW: 12.4 % (ref 11.5–15.5)
WBC: 8.9 10*3/uL (ref 4.0–10.5)
nRBC: 0 % (ref 0.0–0.2)

## 2021-08-20 LAB — COMPREHENSIVE METABOLIC PANEL
ALT: 20 U/L (ref 0–44)
AST: 22 U/L (ref 15–41)
Albumin: 4 g/dL (ref 3.5–5.0)
Alkaline Phosphatase: 78 U/L (ref 38–126)
Anion gap: 7 (ref 5–15)
BUN: 11 mg/dL (ref 6–20)
CO2: 25 mmol/L (ref 22–32)
Calcium: 9.4 mg/dL (ref 8.9–10.3)
Chloride: 108 mmol/L (ref 98–111)
Creatinine, Ser: 1.09 mg/dL (ref 0.61–1.24)
GFR, Estimated: >60 mL/min (ref >60)
Glucose, Bld: 102 mg/dL — ABNORMAL HIGH (ref 70–99)
Potassium: 4 mmol/L (ref 3.5–5.1)
Sodium: 140 mmol/L (ref 135–145)
Total Bilirubin: 0.7 mg/dL (ref 0.3–1.2)
Total Protein: 6.9 g/dL (ref 6.5–8.1)

## 2021-08-20 LAB — RAPID URINE DRUG SCREEN, HOSP PERFORMED
Amphetamines: NOT DETECTED
Barbiturates: NOT DETECTED
Benzodiazepines: NOT DETECTED
Cocaine: POSITIVE — AB
Opiates: NOT DETECTED
Tetrahydrocannabinol: NOT DETECTED

## 2021-08-20 LAB — ETHANOL: Alcohol, Ethyl (B): <10 mg/dL (ref <10)

## 2021-08-20 MED ORDER — ACETAMINOPHEN 500 MG PO TABS
1000.0000 mg | ORAL_TABLET | Freq: Four times a day (QID) | ORAL | Status: DC | PRN
  Filled 2021-08-20: qty 2

## 2021-08-20 MED ORDER — ONDANSETRON HCL 4 MG PO TABS: 4.0000 mg | ORAL_TABLET | Freq: Three times a day (TID) | ORAL | Status: DC | PRN

## 2021-08-20 MED ORDER — ATENOLOL 25 MG PO TABS
25.0000 mg | ORAL_TABLET | Freq: Every day | ORAL | Status: DC
  Administered 2021-08-20 – 2021-08-21 (×2): 25 mg via ORAL
  Filled 2021-08-20 (×2): qty 1

## 2021-08-20 MED ORDER — NALTREXONE HCL 50 MG PO TABS
50.0000 mg | ORAL_TABLET | Freq: Every day | ORAL | Status: DC
  Administered 2021-08-20 – 2021-08-21 (×2): 50 mg via ORAL
  Filled 2021-08-20 (×2): qty 1

## 2021-08-20 MED ORDER — DILTIAZEM HCL ER COATED BEADS 240 MG PO CP24
240.0000 mg | ORAL_CAPSULE | Freq: Every day | ORAL | Status: DC
  Administered 2021-08-20 – 2021-08-21 (×2): 240 mg via ORAL
  Filled 2021-08-20 (×2): qty 1

## 2021-08-20 MED ORDER — ISOSORBIDE MONONITRATE ER 30 MG PO TB24
30.0000 mg | ORAL_TABLET | Freq: Every day | ORAL | Status: DC
  Administered 2021-08-20 – 2021-08-21 (×2): 30 mg via ORAL
  Filled 2021-08-20 (×2): qty 1

## 2021-08-20 MED ORDER — BUPROPION HCL ER (XL) 300 MG PO TB24
300.0000 mg | ORAL_TABLET | Freq: Every day | ORAL | Status: DC
  Administered 2021-08-20 – 2021-08-21 (×2): 300 mg via ORAL
  Filled 2021-08-20 (×2): qty 1

## 2021-08-20 MED ORDER — ARIPIPRAZOLE 10 MG PO TABS
10.0000 mg | ORAL_TABLET | Freq: Every day | ORAL | Status: DC
  Administered 2021-08-20 – 2021-08-21 (×2): 10 mg via ORAL
  Filled 2021-08-20 (×2): qty 1

## 2021-08-20 NOTE — BH Assessment (Addendum)
@  1322, contacted the [facility] Emergency [facility], spoke to the nursing secretary, and made them aware that TTS is ready to assess patient. Requested staff's assistance with setting up the tele assessment cart.

## 2021-08-20 NOTE — ED Provider Notes (Addendum)
[facility] EMERGENCY DEPARTMENT Provider Note   CSN: [id number] Arrival date & time: 08/20/21  1025     History  Chief Complaint  Patient presents with   Suicidal    VALENTE FOSBERG is a 56 y.o. male.  56 year old male presents today for evaluation of psychiatric evaluation.  Patient is here voluntarily. Pt complains of suicidal ideations.  3 prior suicide attempts, denies any attempts today.  Has not had any alcohol or illicit drugs, denies any homicidal ideations.  Endorses visual hallucinations.  Patient states he feels if he left today he would end his own life.  He is agreeing to stay and be evaluated by psychiatry once medically cleared.  The history is provided by the patient. No language interpreter was used.       Home Medications Prior to Admission medications   Medication Sig Start Date End Date Taking? Authorizing Provider  acetaminophen (TYLENOL) 500 MG tablet Take 2,000 mg by mouth every 4 (four) hours as needed for moderate pain.    [provider]  ARIPiprazole (ABILIFY) 5 MG tablet Take 1 tablet (5 mg total) by mouth daily for 4 days, THEN 2 tablets (10 mg total) daily. 06/12/21 06/16/22  Nwoko, Tommas Olp, PA  buPROPion (WELLBUTRIN XL) 150 MG 24 hr tablet Take 1 tablet (150 mg total) by mouth every morning for 4 days, THEN 2 tablets (300 mg total) every morning. 06/12/21 06/16/22  Nwoko, Tommas Olp, PA  dicyclomine (BENTYL) 20 MG tablet Take 1 tablet (20 mg total) by mouth 2 (two) times daily as needed for spasms. Patient not taking: Reported on 01/27/2021 05/27/20   Carroll Sage, PA-C  diltiazem (CARDIZEM CD) 360 MG 24 hr capsule Take 1 capsule (360 mg total) by mouth daily. 01/27/21   Tobb, Kardie, DO  doxycycline (VIBRAMYCIN) 100 MG capsule Take 1 capsule (100 mg total) by mouth 2 (two) times daily. Patient not taking: Reported on 01/27/2021 10/03/20   Tanda Rockers A, DO  isosorbide mononitrate (IMDUR) 30 MG 24 hr tablet Take 1 tablet  (30 mg total) by mouth daily. 01/27/21   Tobb, Kardie, DO  meclizine (ANTIVERT) 25 MG tablet Take 1 tablet (25 mg total) by mouth 3 (three) times daily as needed for dizziness. 07/29/21   Sloan Leiter, DO  metoprolol succinate (TOPROL-XL) 25 MG 24 hr tablet Take 1 tablet (25 mg total) by mouth daily. 01/31/21 03/02/21  Cheryll Cockayne, MD  naltrexone (DEPADE) 50 MG tablet Take 1 tablet (50 mg total) by mouth daily. 06/12/21   Nwoko, Tommas Olp, PA  naproxen (NAPROSYN) 500 MG tablet Take 1 tablet (500 mg total) by mouth 2 (two) times daily. Patient not taking: Reported on 01/27/2021 08/13/20   Tanda Rockers, PA-C  omeprazole (PRILOSEC) 20 MG capsule Take 1 capsule (20 mg total) by mouth daily. 08/17/21 09/16/21  Ellsworth Lennox, PA-C  ondansetron (ZOFRAN) 4 MG tablet Take 1 tablet (4 mg total) by mouth every 8 (eight) hours as needed for nausea or vomiting. 08/17/21   Ellsworth Lennox, PA-C      Allergies    Bee venom, Peanut oil, and Peanut-containing drug products    Review of Systems   Review of Systems  Respiratory:  Negative for shortness of breath.   Cardiovascular:  Negative for chest pain.  Psychiatric/Behavioral:  Positive for suicidal ideas. The patient is not nervous/anxious.   All other systems reviewed and are negative.   Physical Exam Updated Vital Signs BP (!) 128/112 (BP Location:  Left Arm)   Pulse 69   Temp 98.2 F (36.8 C) (Oral)   Resp 17   SpO2 97%  Physical Exam Vitals and nursing note reviewed.  Constitutional:      General: He is not in acute distress.    Appearance: Normal appearance. He is not ill-appearing.  HENT:     Head: Normocephalic and atraumatic.     Nose: Nose normal.  Eyes:     Conjunctiva/sclera: Conjunctivae normal.  Pulmonary:     Effort: Pulmonary effort is normal. No respiratory distress.  Musculoskeletal:        General: No deformity.  Skin:    Findings: No rash.  Neurological:     Mental Status: He is alert.     ED Results / Procedures /  Treatments   Labs (all labs ordered are listed, but only abnormal results are displayed) Labs Reviewed  COMPREHENSIVE METABOLIC PANEL - Abnormal; Notable for the following components:      Result Value   Glucose, Bld 102 (*)    All other components within normal limits  RAPID URINE DRUG SCREEN, HOSP PERFORMED - Abnormal; Notable for the following components:   Cocaine POSITIVE (*)    All other components within normal limits  RESP PANEL BY RT-PCR (FLU A&B, COVID) ARPGX2  ETHANOL  CBC WITH DIFFERENTIAL/PLATELET    EKG None  Radiology No results found.  Procedures Procedures    Medications Ordered in ED Medications - No data to display  ED Course/ Medical Decision Making/ A&P                           Medical Decision Making  56 year old male presents today for psychiatric evaluation.  He has history of SI.  Denies other complaints at this time.  CBC is unremarkable.  Alcohol level was within normal limits.  CMP is unremarkable.  UDS is positive for cocaine.  EKG without acute ischemic changes or other concerns.  Patient reports last use yesterday.  He denies HI.  Denies visual hallucinations.  Reports auditory hallucinations occasionally.  Reports he has been off his home medications for quite some time.  States its been a while since he has heard any voices.  Denies chest pain, shortness of breath.  Patient is medically cleared for psychiatric evaluation. TTS consult ordered.   Final Clinical Impression(s) / ED Diagnoses Final diagnoses:  Suicidal ideation    Rx / DC Orders ED Discharge Orders     None         Marita Kansas, PA-C 08/20/21 1534    Marita [place], PA-C 08/20/21 1535    Margarita Grizzle, MD 08/21/21 1301

## 2021-08-20 NOTE — Progress Notes (Signed)
CC: Suicidal Ideation with Intent and Plan  HPI:  Raymond Mcclure is a 56 y.o. male living with a history stated below and presents today for worsening SI with now intent and plan. Please see problem based assessment and plan for additional details.  Past Medical History:  Diagnosis Date   Cluster headaches    Drug abuse (HCC)    Migraines    Obesity    Tachycardia     Current Outpatient Medications on File Prior to Visit  Medication Sig Dispense Refill   acetaminophen (TYLENOL) 500 MG tablet Take 2,000 mg by mouth every 4 (four) hours as needed for moderate pain.     ARIPiprazole (ABILIFY) 5 MG tablet Take 1 tablet (5 mg total) by mouth daily for 4 days, THEN 2 tablets (10 mg total) daily. 60 tablet 1   buPROPion (WELLBUTRIN XL) 150 MG 24 hr tablet Take 1 tablet (150 mg total) by mouth every morning for 4 days, THEN 2 tablets (300 mg total) every morning. 60 tablet 1   dicyclomine (BENTYL) 20 MG tablet Take 1 tablet (20 mg total) by mouth 2 (two) times daily as needed for spasms. (Patient not taking: Reported on 01/27/2021) 20 tablet 0   diltiazem (CARDIZEM CD) 360 MG 24 hr capsule Take 1 capsule (360 mg total) by mouth daily. 90 capsule 3   doxycycline (VIBRAMYCIN) 100 MG capsule Take 1 capsule (100 mg total) by mouth 2 (two) times daily. (Patient not taking: Reported on 01/27/2021) 20 capsule 0   isosorbide mononitrate (IMDUR) 30 MG 24 hr tablet Take 1 tablet (30 mg total) by mouth daily. 90 tablet 3   meclizine (ANTIVERT) 25 MG tablet Take 1 tablet (25 mg total) by mouth 3 (three) times daily as needed for dizziness. 10 tablet 0   metoprolol succinate (TOPROL-XL) 25 MG 24 hr tablet Take 1 tablet (25 mg total) by mouth daily. 30 tablet 0   naltrexone (DEPADE) 50 MG tablet Take 1 tablet (50 mg total) by mouth daily. 30 tablet 1   naproxen (NAPROSYN) 500 MG tablet Take 1 tablet (500 mg total) by mouth 2 (two) times daily. (Patient not taking: Reported on 01/27/2021) 30 tablet 0    omeprazole (PRILOSEC) 20 MG capsule Take 1 capsule (20 mg total) by mouth daily. 30 capsule 0   ondansetron (ZOFRAN) 4 MG tablet Take 1 tablet (4 mg total) by mouth every 8 (eight) hours as needed for nausea or vomiting. 20 tablet 0   No current facility-administered medications on file prior to visit.    Review of Systems: ROS negative except for what is noted on the assessment and plan.  Vitals:   08/20/21 0941  BP: (!) 134/94  Pulse: 84  Temp: 98 F (36.7 C)  TempSrc: Oral  SpO2: 98%  Weight: 250 lb 6.4 oz (113.6 kg)  Height: 5\' 10"  (1.778 m)    Physical Exam: Constitutional: alert, and in acute distress HENT: normocephalic atraumatic Neurological: alert & oriented x 3 Psych: depressed mood with tearful affect, behavior is cooperative, positive for SI with specific plan, denies HI, no delusions or paranoid thoughts   Assessment & Plan:   Suicide ideation Patient presents today to clinic with worsening suicidal ideation with intent and specific plan since yesterday. He reports he will "get off the bus on and "walk straight into the street".  His last suicide attempt was earlier this year by overdose. He mentioned today about selling things to get medications on the street.  He states he wants treatment now because he is worried he will act on his plan. No weapons on him today.   He reports it started yesterday because he was trying to go to his appointment for his sleep apnea.  His daughter was late and missed his appointment. He was frustrated with her and it has been bothering him since last night. He reports being " useless".   He reports being out of Abilify and Wellbutrin for 3 weeks. He was supposed to go to [facility] but appointment was canceled. Has not established mental health care in [place] yet.   He was tearful with depressed mood at the visit. SI with plan. No HI. Discussed going to the ED for evaluation. Patient understands and agrees.    Plan -escort pt to ED for evaluation -close clinic f/u after ED evaluation   Patient seen with Dr. Rollene Fare, D.O. [facility], PGY-1 Phone: [phone] Date 08/20/2021 Time 11:04 AM

## 2021-08-20 NOTE — BH Assessment (Signed)
@  1222, requested patient's nurse Victorino Dike, RN) to set up the cart for patient's initial TTS assessment.

## 2021-08-20 NOTE — ED Provider Triage Note (Signed)
Emergency Medicine Provider Triage Evaluation Note  Raymond Mcclure , a 56 y.o. male  was evaluated in triage.  Pt complains of suicidal ideations.  3 prior suicide attempts, denies any attempts today.  Has not had any alcohol or illicit drugs, denies any homicidal ideations.  Endorses visual hallucinations.  Patient states he feels if he left today he would end his own life.  He is agreeing to stay and be evaluated by psychiatry once medically cleared.  Review of Systems  Positive: SI, hallucination  Negative: HI  Physical Exam  BP (!) 128/112 (BP Location: Left Arm)   Pulse 69   Temp 98.2 F (36.8 C) (Oral)   Resp 17   SpO2 97%  Gen:   Awake, no distress   Resp:  Normal effort  MSK:   Moves extremities without difficulty  Other:  Flat affect, goal oriented speech  Medical Decision Making  Medically screening exam initiated at 11:03 AM.  Appropriate orders placed.  Raymond Mcclure was informed that the remainder of the evaluation will be completed by another provider, this initial triage assessment does not replace that evaluation, and the importance of remaining in the ED until their evaluation is complete.  Patient is voluntary, if he were to leave I would IVC.   Theron Arista, PA-C 08/20/21 1104

## 2021-08-20 NOTE — BH Assessment (Addendum)
@  1258, TTS Clinician available to assess patient. Reached out to nursing to request that tele assessment cart is set up for patient.

## 2021-08-20 NOTE — ED Notes (Signed)
Pt ambulatory to room for TTS with sitter

## 2021-08-20 NOTE — ED Notes (Addendum)
Per LMFT Nickolas Madrid, NP, reviewed pt's chart and information and determined pt should receive continuous assessment and be re-assessed by psychiatry in the morning. Per Channing Mutters, pt is to remain at [facility].

## 2021-08-20 NOTE — ED Provider Notes (Signed)
Patient has been medically screened by previous provider, please see his note for complete H&P.  This is a 56 year old male who is here voluntarily due to suicidal ideation.  He has had multiple suicide attempts in the past, he reported feeling depressed and having thought about harming himself including drug overdose without attempt.  Denies homicidal ideation.  Does endorse some visual hallucination.  He also admits to cocaine use yesterday to help "calm myself down".  At this time he is without any specific complaint.  I have reviewed and interpreted labs and considered in the plan of care.  Patient is medically cleared and can be assessed and managed by psychiatry.  BP (!) 128/112 (BP Location: Left Arm)   Pulse 69   Temp 98.2 F (36.8 C) (Oral)   Resp 17   SpO2 97%   Results for orders placed or performed during the hospital encounter of 08/20/21  Comprehensive metabolic panel  Result Value Ref Range   Sodium 140 135 - 145 mmol/L   Potassium 4.0 3.5 - 5.1 mmol/L   Chloride 108 98 - 111 mmol/L   CO2 25 22 - 32 mmol/L   Glucose, Bld 102 (H) 70 - 99 mg/dL   BUN 11 6 - 20 mg/dL   Creatinine, Ser 1.02 0.61 - 1.24 mg/dL   Calcium 9.4 8.9 - 72.5 mg/dL   Total Protein 6.9 6.5 - 8.1 g/dL   Albumin 4.0 3.5 - 5.0 g/dL   AST 22 15 - 41 U/L   ALT 20 0 - 44 U/L   Alkaline Phosphatase 78 38 - 126 U/L   Total Bilirubin 0.7 0.3 - 1.2 mg/dL   GFR, Estimated >36 >64 mL/min   Anion gap 7 5 - 15  Ethanol  Result Value Ref Range   Alcohol, Ethyl (B) <10 <10 mg/dL  Urine rapid drug screen (hosp performed)  Result Value Ref Range   Opiates NONE DETECTED NONE DETECTED   Cocaine POSITIVE (A) NONE DETECTED   Benzodiazepines NONE DETECTED NONE DETECTED   Amphetamines NONE DETECTED NONE DETECTED   Tetrahydrocannabinol NONE DETECTED NONE DETECTED   Barbiturates NONE DETECTED NONE DETECTED  CBC with Diff  Result Value Ref Range   WBC 8.9 4.0 - 10.5 K/uL   RBC 4.63 4.22 - 5.81 MIL/uL   Hemoglobin  14.9 13.0 - 17.0 g/dL   HCT 40.3 47.4 - 25.9 %   MCV 94.0 80.0 - 100.0 fL   MCH 32.2 26.0 - 34.0 pg   MCHC 34.3 30.0 - 36.0 g/dL   RDW 56.3 87.5 - 64.3 %   Platelets 313 150 - 400 K/uL   nRBC 0.0 0.0 - 0.2 %   Neutrophils Relative % 55 %   Neutro Abs 4.8 1.7 - 7.7 K/uL   Lymphocytes Relative 34 %   Lymphs Abs 3.1 0.7 - 4.0 K/uL   Monocytes Relative 8 %   Monocytes Absolute 0.7 0.1 - 1.0 K/uL   Eosinophils Relative 3 %   Eosinophils Absolute 0.3 0.0 - 0.5 K/uL   Basophils Relative 0 %   Basophils Absolute 0.0 0.0 - 0.1 K/uL   Immature Granulocytes 0 %   Abs Immature Granulocytes 0.01 0.00 - 0.07 K/uL   DG Chest 1 View  Result Date: 07/29/2021 CLINICAL DATA:  Dizziness and nausea EXAM: CHEST  1 VIEW COMPARISON:  07/15/2021 FINDINGS: The heart size and mediastinal contours are within normal limits. Both lungs are clear. The visualized skeletal structures are unremarkable. IMPRESSION: No active disease. Electronically  Signed   By: Jasmine Pang M.D.   On: 07/29/2021 15:12         Fayrene Helper, PA-C 08/20/21 1912    Wynetta Fines, MD 08/21/21 [phone]

## 2021-08-20 NOTE — BH Assessment (Signed)
Clinician made contact with pt's team in an effort to complete pt's MH Assessment. Pt's nurse, Alcario Drought RN, shared they would work to get pt into a room at their earliest convenience.

## 2021-08-20 NOTE — ED Notes (Signed)
3 copies of ivc paperwork on purple folder, original in red folder, and 1 in medical records

## 2021-08-20 NOTE — ED Triage Notes (Signed)
Pt reports on going feelings of depression and self harm with plan.  HX: Depression
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
## 2021-08-20 NOTE — Assessment & Plan Note (Addendum)
Patient presents today to clinic with worsening suicidal ideation with intent and specific plan since yesterday. He reports he will "get off the bus on [address] and "walk straight into the street".  His last suicide attempt was earlier this year by overdose. He mentioned today about selling things to get medications on the street. He states he wants treatment now because he is worried he will act on his plan. No weapons on him today.   He reports it started yesterday because he was trying to go to his appointment for his sleep apnea.  His daughter was late and missed his appointment. He was frustrated with her and it has been bothering him since last night. He reports being " useless".   He reports being out of Abilify and Wellbutrin for 3 weeks. He was supposed to go to [facility] but appointment was canceled. Has not established mental health care in [place] yet.   He was tearful with depressed mood at the visit. SI with plan. No HI. Discussed going to the ED for evaluation. Patient understands and agrees.   Plan -escort pt to ED for evaluation -close clinic f/u after ED evaluation

## 2021-08-20 NOTE — ED Notes (Signed)
Pt denies SI at current time

## 2021-08-21 ENCOUNTER — Other Ambulatory Visit: Payer: Self-pay

## 2021-08-21 DIAGNOSIS — F1494 Cocaine use, unspecified with cocaine-induced mood disorder: Secondary | ICD-10-CM

## 2021-08-21 LAB — RESP PANEL BY RT-PCR (FLU A&B, COVID) ARPGX2
Influenza A by PCR: NEGATIVE
Influenza B by PCR: NEGATIVE
SARS Coronavirus 2 by RT PCR: NEGATIVE

## 2021-08-21 MED ORDER — BUPROPION HCL ER (XL) 300 MG PO TB24
300.0000 mg | ORAL_TABLET | Freq: Every day | ORAL | 0 refills | Status: DC
  Filled 2021-08-21: qty 30, 30d supply, fill #0

## 2021-08-21 MED ORDER — ARIPIPRAZOLE 10 MG PO TABS
10.0000 mg | ORAL_TABLET | Freq: Every day | ORAL | 0 refills | Status: DC
Start: 2021-08-22 — End: 2021-08-21

## 2021-08-21 MED ORDER — ARIPIPRAZOLE 10 MG PO TABS
10.0000 mg | ORAL_TABLET | Freq: Every day | ORAL | 0 refills | Status: DC
Start: 2021-08-22 — End: 2021-09-04
  Filled 2021-08-21: qty 30, 30d supply, fill #0

## 2021-08-21 NOTE — ED Notes (Signed)
Pt's belongings (2 pt belonging bags) locked in purple zone cabinet  #8

## 2021-08-21 NOTE — BH Assessment (Signed)
[facility] Assessment Progress Note   Per Ophelia Shoulder, NP, this pt does not require psychiatric hospitalization at this time.  Pt is psychiatrically cleared.  Pt sees Karel Jarvis, PA at [facility], and he reportedly missed an appointment yesterday (08/19/2021).  At 11:07 I called them to ask about scheduling a new appointment.  Call rolled to voice mail and I left a message.  Return call is pending as of this writing.  Discharge instructions include generic recommendation to continue treatment at [facility].  I will add appointment if I receive it in a timely manner.  EDP Margarita Grizzle, MD and pt's nurse, Minerva Ends, have been notified.  Doylene Canning, MA Triage Specialist [phone]

## 2021-08-21 NOTE — Discharge Instructions (Addendum)
For your behavioral health needs you are advised to continue treatment with [facility].  Contact them at your earliest opportunity to schedule a new appointment:      [facility]      [address], [address] [place], [place] [address]      [phone]

## 2021-08-21 NOTE — ED Provider Notes (Signed)
Psychiatry has cleared patient. He is not suicidal. D/c home, f/u with behavioral health.   Pricilla Loveless, MD 08/21/21 1209

## 2021-08-21 NOTE — Consult Note (Signed)
Telepsych Consultation   Reason for Consult:  Psychiatric Reassessment Referring Physician:  Theron AristaSage, Haley, PA-C [facility] Location of Provider: Other: Virtual home office  Patient Identification: Raymond Raymond Mcclure D Raymond Mcclure MRN:  [id number] Principal Diagnosis: Cocaine-induced mood disorder (HCC) Diagnosis:  Principal Problem:   Cocaine-induced mood disorder (HCC) Active Problems:   Bipolar affective disorder, currently depressed, moderate (HCC)   Polysubstance abuse (HCC)   Total Time spent with patient: 30 minutes  Subjective:   Raymond Raymond Mcclure is a 56 y.o. male patient admitted with suicidal ideations following silent conflict with his daughter, and psych medication non-compliance. His presentation is complicated by cocaine use, for which his UDS is positive for.   HPI:   Patient seen via telepsych by this provider; chart reviewed and consulted with Dr. Lucianne MussKumar on 08/21/21.  On evaluation Raymond Raymond Mcclure reports his daughter upset him yesterday so he used cocaine to calm his nerves ,and began having suicidal thoughts.   Patient admitted for overnight observation for suicidal ideations in the setting of cocaine use, not taking antipsychotic medications, not getting enough sleep and inability to use coping skills.  Since admission, his sleep has improved, he was restarted on aripiprazole, wellbutrin, naltrexone; and is no longer acutely intoxicated  with cocaine. Pt demonstrates symptomatic improvement.  He denies suicidal ideations, and contracts for safety.  He's now able to process psychosocial stressors and employ coping skills. Pt requests to be discharged.  States he lives with is mother who is aware he's at the hospital.   During evaluation Raymond Raymond Mcclure is seated on chair; he is alert/oriented x 4; calm/cooperative; and mood congruent with affect.  Patient is speaking in a clear tone at moderate volume, and normal pace; with good eye contact.  His thought process is  coherent and relevant; There is no indication that he is currently responding to internal/external stimuli or experiencing delusional thought content.  Patient denies suicidal/self-harm/homicidal ideation, psychosis, and paranoia.  Patient has remained calm throughout assessment and has answered questions appropriately.   Past Psychiatric History: polysubstance abuse; bipolar disorder,   Risk to Self:  no Risk to Others:  no Prior Inpatient Therapy: yes  [facility] Out[facility]  [facility]  Past Medical History:  Past Medical History:  Diagnosis Date   Cluster headaches    Drug abuse (HCC)    Migraines    Obesity    Tachycardia     Past Surgical History:  Procedure Laterality Date   LOOP RECORDER IMPLANT     WRIST SURGERY     nerve repair   Family History: No family history on file. Family Psychiatric  History: unknown Social History:  Social History   Substance and Sexual Activity  Alcohol Use No     Social History   Substance and Sexual Activity  Drug Use Not Currently   Comment: Fentanyl    Social History   Socioeconomic History   Marital status: Legally Separated    Spouse name: Not on file   Number of children: Not on file   Years of education: Not on file   Highest education level: Not on file  Occupational History   Not on file  Tobacco Use   Smoking status: Some Days    Types: Cigars   Smokeless tobacco: Never  Vaping Use   Vaping Use: Never used  Substance and Sexual Activity   Alcohol use: No   Drug use: Not Currently    Comment: Fentanyl  Sexual activity: Not on file    Comment: crack  Other Topics Concern   Not on file  Social History Narrative   Not on file   Social Determinants of Health   Financial Resource Strain: Not on file  Food Insecurity: Not on file  Transportation Needs: Not on file  Physical Activity: Not on file  Stress: Not on file  Social Connections: Not on file   Additional Social History:    Allergies:    Allergies  Allergen Reactions   Bee Venom Anaphylaxis   Peanut Oil Anaphylaxis    + peanut butter    Peanut-Containing Drug Products Anaphylaxis    Labs:  Results for orders placed or performed during the hospital encounter of 08/20/21 (from the past 48 hour(s))  Comprehensive metabolic panel     Status: Abnormal   Collection Time: 08/20/21 11:09 AM  Result Value Ref Range   Sodium 140 135 - 145 mmol/L   Potassium 4.0 3.5 - 5.1 mmol/L   Chloride 108 98 - 111 mmol/L   CO2 25 22 - 32 mmol/L   Glucose, Bld 102 (H) 70 - 99 mg/dL    Comment: Glucose reference range applies only to samples taken after fasting for at least 8 hours.   BUN 11 6 - 20 mg/dL   Creatinine, Ser 9.32 0.61 - 1.24 mg/dL   Calcium 9.4 8.9 - 35.5 mg/dL   Total Protein 6.9 6.5 - 8.1 g/dL   Albumin 4.0 3.5 - 5.0 g/dL   AST 22 15 - 41 U/L   ALT 20 0 - 44 U/L   Alkaline Phosphatase 78 38 - 126 U/L   Total Bilirubin 0.7 0.3 - 1.2 mg/dL   GFR, Estimated >73 >22 mL/min    Comment: (NOTE) Calculated using the CKD-EPI Creatinine Equation (2021)    Anion gap 7 5 - 15    Comment: Performed at [facility], [address], [place], [place] [address]  Ethanol     Status: None   Collection Time: 08/20/21 11:09 AM  Result Value Ref Range   Alcohol, Ethyl (B) <10 <10 mg/dL    Comment: (NOTE) Lowest detectable limit for serum alcohol is 10 mg/dL.  For medical purposes only. Performed at [facility], [address], [place], [place] [address]   CBC with Diff     Status: None   Collection Time: 08/20/21 11:09 AM  Result Value Ref Range   WBC 8.9 4.0 - 10.5 K/uL   RBC 4.63 4.22 - 5.81 MIL/uL   Hemoglobin 14.9 13.0 - 17.0 g/dL   HCT 76.2 83.1 - 51.7 %   MCV 94.0 80.0 - 100.0 fL   MCH 32.2 26.0 - 34.0 pg   MCHC 34.3 30.0 - 36.0 g/dL   RDW 61.6 07.3 - 71.0 %   Platelets 313 150 - 400 K/uL   nRBC 0.0 0.0 - 0.2 %   Neutrophils Relative % 55 %   Neutro Abs 4.8 1.7 - 7.7 K/uL   Lymphocytes Relative 34 %    Lymphs Abs 3.1 0.7 - 4.0 K/uL   Monocytes Relative 8 %   Monocytes Absolute 0.7 0.1 - 1.0 K/uL   Eosinophils Relative 3 %   Eosinophils Absolute 0.3 0.0 - 0.5 K/uL   Basophils Relative 0 %   Basophils Absolute 0.0 0.0 - 0.1 K/uL   Immature Granulocytes 0 %   Abs Immature Granulocytes 0.01 0.00 - 0.07 K/uL    Comment: Performed at [facility] Lab, [address] [place], [place] [address]  Urine rapid drug screen (hosp performed)     Status: Abnormal   Collection Time: 08/20/21 11:53 AM  Result Value Ref Range   Opiates NONE DETECTED NONE DETECTED   Cocaine POSITIVE (A) NONE DETECTED   Benzodiazepines NONE DETECTED NONE DETECTED   Amphetamines NONE DETECTED NONE DETECTED   Tetrahydrocannabinol NONE DETECTED NONE DETECTED   Barbiturates NONE DETECTED NONE DETECTED    Comment: (NOTE) DRUG SCREEN FOR MEDICAL PURPOSES ONLY.  IF CONFIRMATION IS NEEDED FOR ANY PURPOSE, NOTIFY LAB WITHIN 5 DAYS.  LOWEST DETECTABLE LIMITS FOR URINE DRUG SCREEN Drug Class                     Cutoff (ng/mL) Amphetamine and metabolites    1000 Barbiturate and metabolites    200 Benzodiazepine                 200 Tricyclics and metabolites     300 Opiates and metabolites        300 Cocaine and metabolites        300 THC                            50 Performed at [facility], [address] [place], [place] [address]   Resp Panel by RT-PCR (Flu A&B, Covid) Anterior Nasal Swab     Status: None   Collection Time: 08/21/21  2:00 AM   Specimen: Anterior Nasal Swab  Result Value Ref Range   SARS Coronavirus 2 by RT PCR NEGATIVE NEGATIVE    Comment: (NOTE) SARS-CoV-2 target nucleic acids are NOT DETECTED.  The SARS-CoV-2 RNA is generally detectable in upper respiratory specimens during the acute phase of infection. The lowest concentration of SARS-CoV-2 viral copies this assay can detect is 138 copies/mL. A negative result does not preclude SARS-Cov-2 infection and should not be used as  the sole basis for treatment or other patient management decisions. A negative result may occur with  improper specimen collection/handling, submission of specimen other than nasopharyngeal swab, presence of viral mutation(s) within the areas targeted by this assay, and inadequate number of viral copies(<138 copies/mL). A negative result must be combined with clinical observations, patient history, and epidemiological information. The expected result is Negative.  Fact Sheet for Patients:  BloggerCourse.com  Fact Sheet for Healthcare Providers:  SeriousBroker.it  This test is no t yet approved or cleared by the [place] FDA and  has been authorized for detection and/or diagnosis of SARS-CoV-2 by FDA under an Emergency Use Authorization (EUA). This EUA will remain  in effect (meaning this test can be used) for the duration of the COVID-19 declaration under Section 564(b)(1) of the Act, 21 U.S.C.section 360bbb-3(b)(1), unless the authorization is terminated  or revoked sooner.       Influenza A by PCR NEGATIVE NEGATIVE   Influenza B by PCR NEGATIVE NEGATIVE    Comment: (NOTE) The Xpert Xpress SARS-CoV-2/FLU/RSV plus assay is intended as an aid in the diagnosis of influenza from Nasopharyngeal swab specimens and should not be used as a sole basis for treatment. Nasal washings and aspirates are unacceptable for Xpert Xpress SARS-CoV-2/FLU/RSV testing.  Fact Sheet for Patients: BloggerCourse.com  Fact Sheet for Healthcare Providers: SeriousBroker.it  This test is not yet approved or cleared by the [place] FDA and has been authorized for detection and/or diagnosis of SARS-CoV-2 by FDA under an  Emergency Use Authorization (EUA). This EUA will remain in effect (meaning this test can be used) for the duration of the COVID-19 declaration under Section 564(b)(1) of the Act, 21  U.S.C. section 360bbb-3(b)(1), unless the authorization is terminated or revoked.  Performed at [facility], [address], [place], [place] [address]     Medications:  Current Facility-Administered Medications  Medication Dose Route Frequency Provider Last Rate Last Admin   acetaminophen (TYLENOL) tablet 1,000 mg  1,000 mg Oral Q6H PRN Fayrene Helper, PA-C       ARIPiprazole (ABILIFY) tablet 10 mg  10 mg Oral Daily Fayrene Helper, PA-C   10 mg at 08/21/21 0932   atenolol (TENORMIN) tablet 25 mg  25 mg Oral Daily Fayrene Helper, PA-C   25 mg at 08/21/21 0932   buPROPion (WELLBUTRIN XL) 24 hr tablet 300 mg  300 mg Oral Daily Fayrene Helper, PA-C   300 mg at 08/21/21 0932   diltiazem (CARDIZEM CD) 24 hr capsule 240 mg  240 mg Oral Daily Fayrene Helper, PA-C   240 mg at 08/21/21 0932   isosorbide mononitrate (IMDUR) 24 hr tablet 30 mg  30 mg Oral Daily Fayrene Helper, PA-C   30 mg at 08/21/21 0932   naltrexone (DEPADE) tablet 50 mg  50 mg Oral Daily Fayrene Helper, PA-C   50 mg at 08/21/21 0932   ondansetron (ZOFRAN) tablet 4 mg  4 mg Oral Q8H PRN Fayrene Helper, PA-C       Current Outpatient Medications  Medication Sig Dispense Refill   atenolol (TENORMIN) 25 MG tablet Take 25 mg by mouth daily.     diltiazem (DILACOR XR) 240 MG 24 hr capsule Take 240 mg by mouth daily.     isosorbide mononitrate (IMDUR) 30 MG 24 hr tablet Take 1 tablet (30 mg total) by mouth daily. 90 tablet 3   metoprolol succinate (TOPROL-XL) 25 MG 24 hr tablet Take 1 tablet (25 mg total) by mouth daily. 30 tablet 0   naltrexone (DEPADE) 50 MG tablet Take 1 tablet (50 mg total) by mouth daily. 30 tablet 1   omeprazole (PRILOSEC) 20 MG capsule Take 1 capsule (20 mg total) by mouth daily. 30 capsule 0   ondansetron (ZOFRAN) 4 MG tablet Take 1 tablet (4 mg total) by mouth every 8 (eight) hours as needed for nausea or vomiting. 20 tablet 0   acetaminophen (TYLENOL) 500 MG tablet Take 2,000 mg by mouth every 4 (four) hours as needed for  moderate pain. (Patient not taking: Reported on 08/20/2021)     [START ON 08/22/2021] ARIPiprazole (ABILIFY) 10 MG tablet Take 1 tablet (10 mg total) by mouth daily. 30 tablet 0   [START ON 08/22/2021] buPROPion (WELLBUTRIN XL) 300 MG 24 hr tablet Take 1 tablet (300 mg total) by mouth daily. 30 tablet 0   dicyclomine (BENTYL) 20 MG tablet Take 1 tablet (20 mg total) by mouth 2 (two) times daily as needed for spasms. (Patient not taking: Reported on 01/27/2021) 20 tablet 0   diltiazem (CARDIZEM CD) 360 MG 24 hr capsule Take 1 capsule (360 mg total) by mouth daily. (Patient not taking: Reported on 08/20/2021) 90 capsule 3   doxycycline (VIBRAMYCIN) 100 MG capsule Take 1 capsule (100 mg total) by mouth 2 (two) times daily. (Patient not taking: Reported on 01/27/2021) 20 capsule 0   meclizine (ANTIVERT) 25 MG tablet Take 1 tablet (25 mg total) by mouth 3 (three) times daily as needed for dizziness. (Patient not taking: Reported  on 08/20/2021) 10 tablet 0   naproxen (NAPROSYN) 500 MG tablet Take 1 tablet (500 mg total) by mouth 2 (two) times daily. (Patient not taking: Reported on 01/27/2021) 30 tablet 0    Musculoskeletal:pt moves all extremities and ambulates independently Strength & Muscle Tone: within normal limits Gait & Station: normal Patient leans: N/A  Psychiatric Specialty Exam:  Presentation  General Appearance: Appropriate for Environment; Casual  Eye Contact:Good  Speech:Clear and Coherent; Normal Rate  Speech Volume:Normal  Handedness:Right   Mood and Affect  Mood:Euthymic  Affect:Appropriate; Congruent   Thought Process  Thought Processes:Coherent; Goal Directed  Descriptions of Associations:Intact  Orientation:Full (Time, Place and Person)  Thought Content:Logical  History of Schizophrenia/Schizoaffective disorder:No  Duration of Psychotic Symptoms:N/A  Hallucinations:Hallucinations: None  Ideas of Reference:None  Suicidal Thoughts:Suicidal Thoughts: No (have  improved since admissions, sleep, restarting meds and waning effects of cocaine)  Homicidal Thoughts:Homicidal Thoughts: No   Sensorium  Memory:Immediate Good; Recent Good; Remote Good  Judgment:Fair (in the setting of illicit substance use)  Insight:Fair   Executive Functions  Concentration:Good  Attention Span:Good  Recall:Good  Fund of Knowledge:Good  Language:Good   Psychomotor Activity  Psychomotor Activity:Psychomotor Activity: Normal   Assets  Assets:Communication Skills; Housing; Health and safety inspector; Social Support   Sleep  Sleep:Sleep: Good Number of Hours of Sleep: 8    Physical Exam: Physical Exam Neurological:     General: No focal deficit present.     Mental Status: He is oriented to person, place, and time. Mental status is at baseline.  Psychiatric:        Attention and Perception: Attention and perception normal.        Mood and Affect: Mood and affect normal.        Speech: Speech normal.        Behavior: Behavior normal. Behavior is cooperative.        Thought Content: Thought content normal. Thought content does not include suicidal ideation. Thought content does not include suicidal plan.        Cognition and Memory: Cognition and memory normal.        Judgment: Judgment is impulsive (baseline as he chronically uses illicit substances).    Review of Systems  Constitutional: Negative.   HENT: Negative.    Eyes: Negative.   Respiratory: Negative.    Cardiovascular: Negative.   Gastrointestinal: Negative.   Genitourinary: Negative.   Musculoskeletal: Negative.   Skin: Negative.   Neurological: Negative.   Endo/Heme/Allergies: Negative.   Psychiatric/Behavioral:  Positive for substance abuse. Negative for suicidal ideas.    Blood pressure 106/74, pulse 60, temperature 97.6 F (36.4 C), resp. rate 16, SpO2 96 %. There is no height or weight on file to calculate BMI.  Treatment Plan Summary: Patient admitted for overnight  observation for suicidal ideations in the setting of cocaine use, not taking antipsychotic medications, not getting enough sleep and inability to use coping skills.  Since admission, and getting sleep, restarting aripiprazole 10mg  po daily;  wellbutrin 300mg  24hr tab daily, naltrexone 50mg  po daily and coming down from cocaine intoxication, pt demonstrates symptomatic improvement.  He denies suicidal ideations, and contracts for safety.  He's now able to process psychosocial stressors and employ coping skills.    Plan- As per above assessment, there are no current grounds for involuntary commitment at this time. He sees [facility] for outpatient mental health needs and should follow-up within one week for med mgmt. SW to include follow-up recommendations in discharge AVS. 30 days supply of wellbutrin,  aripiprazole and naltrexone send to [facility], per pt request.   Patient is not currently interested in inpatient services but expresses agreement to continue outpatient treatment., we have reviewed importance of substance abuse abstinence, potential negative impact substance abuse can have on his relationships and level of functioning, and importance of medication compliance.  Disposition: No evidence of imminent risk to self or others at present.   Patient does not meet criteria for psychiatric inpatient admission. Supportive therapy provided about ongoing stressors. Discussed crisis plan, support from social network, calling 911, coming to the Emergency Department, and calling Suicide Hotline.  This service was provided via telemedicine using a 2-way, interactive audio and video technology.  Names of all persons participating in this telemedicine service and their role in this encounter. Name: KEETON KASSEBAUM Role: Patient  Name: Ophelia Shoulder Role: PMHNP    Chales Abrahams, NP 08/21/2021 1:54 PM

## 2021-08-21 NOTE — ED Notes (Signed)
Pt in hallway. A&O x4 denies any SI/HI/AH/VH at this time. Pt reports that missing his doctors appt because of his daughter triggered the episode and he just couldn't get it out his head. Pt reports feeling much better today and being able to process that situation better. Pt updated on plan of care for Re-eval this AM. Pt calm and cooperative with staff.

## 2021-08-21 NOTE — ED Provider Notes (Signed)
Emergency Medicine Observation Re-evaluation Note  Raymond Mcclure is a 56 y.o. male, seen on rounds today.  Pt initially presented to the ED for complaints of Suicidal Currently, the patient is resting.  Physical Exam  BP (!) 97/57   Pulse 62   Temp 97.6 F (36.4 C)   Resp 16   SpO2 98%  Physical Exam General: no acute distress Lungs: normal effort Psych: not suicidal/homicidal. No psychosis  ED Course / MDM  EKG:EKG Interpretation  Date/Time:  Thursday August 20 2021 15:19:53 EDT Ventricular Rate:  74 PR Interval:  190 QRS Duration: 74 QT Interval:  396 QTC Calculation: 439 R Axis:   49 Text Interpretation: Normal sinus rhythm Nonspecific T wave abnormality Abnormal ECG When compared with ECG of 29-Jul-2021 14:45, PREVIOUS ECG IS PRESENT Confirmed by Kristine Royal [phone]) on 08/20/2021 10:47:25 PM  I have reviewed the labs performed to date as well as medications administered while in observation.  Recent changes in the last 24 hours include home meds being ordered.  Plan  Current plan is for psych reassessment. He is currently feeling better.  JAVONE YBANEZ is not under involuntary commitment.     Pricilla Loveless, MD 08/21/21 [phone]

## 2021-08-24 NOTE — Progress Notes (Signed)
Internal Medicine Clinic Attending  I saw and evaluated the patient.  I personally confirmed the key portions of the history and exam documented by Dr. Zheng and I reviewed pertinent patient test results.  The assessment, diagnosis, and plan were formulated together and I agree with the documentation in the resident's note.  

## 2021-08-26 ENCOUNTER — Encounter: Payer: Commercial Managed Care - HMO | Admitting: Student

## 2021-08-27 ENCOUNTER — Encounter: Payer: Self-pay | Admitting: Student

## 2021-08-27 ENCOUNTER — Other Ambulatory Visit: Payer: Self-pay

## 2021-08-27 ENCOUNTER — Ambulatory Visit (INDEPENDENT_AMBULATORY_CARE_PROVIDER_SITE_OTHER): Payer: Self-pay | Admitting: Student

## 2021-08-27 VITALS — BP 108/78 | HR 90 | Temp 98.2°F | Resp 28 | Wt 251.8 lb

## 2021-08-27 DIAGNOSIS — F17210 Nicotine dependence, cigarettes, uncomplicated: Secondary | ICD-10-CM

## 2021-08-27 DIAGNOSIS — F3132 Bipolar disorder, current episode depressed, moderate: Secondary | ICD-10-CM

## 2021-08-27 DIAGNOSIS — I10 Essential (primary) hypertension: Secondary | ICD-10-CM

## 2021-08-27 DIAGNOSIS — Z9889 Other specified postprocedural states: Secondary | ICD-10-CM

## 2021-08-27 NOTE — Assessment & Plan Note (Signed)
Vitals:   08/27/21 0833  BP: 108/78  Pulse: 90  Resp: (!) 28  Temp: 98.2 F (36.8 C)  SpO2: 99%   BP normal range today. Previous visits show elevated BP. His medication list has multiple anti-hypertensive meds. He reports taking 3 meds a day for HTN but unsure which ones. Did not bring his meds today. Reports some dizziness when standing up quickly. Concern that he may be taking the incorrect meds. Orthostatic vitals were normal. Discussed with patient about holding his HTN meds for now and to bring them in at 1 week f/u visit.   Plan -advised patient to hold his HTN meds -f/u in 1 week and bring all his medications

## 2021-08-27 NOTE — Progress Notes (Signed)
CC: ED f/u  HPI:  [name] is a 56 y.o. male living with a history stated below and presents today for ED f/u. Please see problem based assessment and plan for additional details.  Past Medical History:  Diagnosis Date   Cluster headaches    Drug abuse (HCC)    Migraines    Obesity    Tachycardia     Current Outpatient Medications on File Prior to Visit  Medication Sig Dispense Refill   acetaminophen (TYLENOL) 500 MG tablet Take 2,000 mg by mouth every 4 (four) hours as needed for moderate pain. (Patient not taking: Reported on 08/20/2021)     ARIPiprazole (ABILIFY) 10 MG tablet Take 1 tablet (10 mg total) by mouth daily. 30 tablet 0   atenolol (TENORMIN) 25 MG tablet Take 25 mg by mouth daily.     buPROPion (WELLBUTRIN XL) 300 MG 24 hr tablet Take 1 tablet (300 mg total) by mouth daily. 30 tablet 0   dicyclomine (BENTYL) 20 MG tablet Take 1 tablet (20 mg total) by mouth 2 (two) times daily as needed for spasms. (Patient not taking: Reported on 01/27/2021) 20 tablet 0   diltiazem (CARDIZEM CD) 360 MG 24 hr capsule Take 1 capsule (360 mg total) by mouth daily. (Patient not taking: Reported on 08/20/2021) 90 capsule 3   diltiazem (DILACOR XR) 240 MG 24 hr capsule Take 240 mg by mouth daily.     isosorbide mononitrate (IMDUR) 30 MG 24 hr tablet Take 1 tablet (30 mg total) by mouth daily. 90 tablet 3   meclizine (ANTIVERT) 25 MG tablet Take 1 tablet (25 mg total) by mouth 3 (three) times daily as needed for dizziness. (Patient not taking: Reported on 08/20/2021) 10 tablet 0   metoprolol succinate (TOPROL-XL) 25 MG 24 hr tablet Take 1 tablet (25 mg total) by mouth daily. 30 tablet 0   naltrexone (DEPADE) 50 MG tablet Take 1 tablet (50 mg total) by mouth daily. 30 tablet 1   naproxen (NAPROSYN) 500 MG tablet Take 1 tablet (500 mg total) by mouth 2 (two) times daily. (Patient not taking: Reported on 01/27/2021) 30 tablet 0   omeprazole (PRILOSEC) 20 MG capsule Take 1 capsule (20 mg  total) by mouth daily. 30 capsule 0   ondansetron (ZOFRAN) 4 MG tablet Take 1 tablet (4 mg total) by mouth every 8 (eight) hours as needed for nausea or vomiting. 20 tablet 0   No current facility-administered medications on file prior to visit.   Review of Systems: ROS negative except for what is noted on the assessment and plan.  Vitals:   08/27/21 0833  BP: 108/78  Pulse: 90  Resp: (!) 28  Temp: 98.2 F (36.8 C)  TempSrc: Oral  SpO2: 99%  Weight: 251 lb 12.8 oz (114.2 kg)   Physical Exam: Constitutional: well-appearing, in no acute distress HENT: normocephalic atraumatic Neck: supple Cardiovascular: regular rate and rhythm Pulmonary/Chest: normal work of breathing on room air, lungs clear to auscultation bilaterally Neurological: alert & oriented x 3 Skin: warm and dry Psych: normal mood and behavior, no SI   Assessment & Plan:   Bipolar affective disorder, currently depressed, moderate (HCC) Patient presents to clinic after ED visit for suicidal ideation. He was restarted on his Abilify 10 mg and Wellbutrin 300 mg. Today he reports feeling better since ED discharge. He has been able to work outside and states if he could "run a marathon I would". He does report increase appetite and late night  cravings,which could be from restarting his Abilify or simply feeling better. Denies having SI today. PHQ-9 was 13, improved from last week.   He had 2 video sessions with [facility]. Plans to reach out to them for follow-up appointment.  It appears that [facility] was prescribing his Abilify and Wellbutrin before. I am contacting [facility] to clarify moving forward who will prescribe the meds.    Plan -Continue Abilify and Wellbutrin -f/u with [facility] -f/u visit in 1 week   Primary hypertension Vitals:   08/27/21 0833  BP: 108/78  Pulse: 90  Resp: (!) 28  Temp: 98.2 F (36.8 C)  SpO2: 99%   BP normal range today. Previous visits show elevated BP. His medication list has  multiple anti-hypertensive meds. He reports taking 3 meds a day for HTN but unsure which ones. Did not bring his meds today. Reports some dizziness when standing up quickly. Concern that he may be taking the incorrect meds. Orthostatic vitals were normal. Discussed with patient about holding his HTN meds for now and to bring them in at 1 week f/u visit.   Plan -advised patient to hold his HTN meds -f/u in 1 week and bring all his medications  History of radiofrequency ablation procedure for cardiac arrhythmia Hx of SVT s/p ablation at [facility] in 02/2021. Since moving to [place], he has not found a cardiologist here. Denies palpitations or chest pain today.  Plan -referral to cardiology   Patient seen with Dr. Marin Roberts, D.O. [facility], PGY-1 Phone: [phone] Date 08/27/2021 Time 10:08 PM

## 2021-08-27 NOTE — Assessment & Plan Note (Signed)
Hx of SVT s/p ablation at [facility] in 02/2021. Since moving to [place], he has not found a cardiologist here. Denies palpitations or chest pain today.  Plan -referral to cardiology

## 2021-08-27 NOTE — Patient Instructions (Addendum)
Thank you, [name] for allowing [place] to provide your care today. Today we discussed your recent ED visit and blood pressure.  -Please hold your blood pressure medications for now until your next visit so that we can figure out which medications you are on.  -Referral to cardiology sent today. -Follow up at clinic in 1-2 weeks, please bring all your medications to this visit.  -I will reach out to [facility] today. Please reach out to them as well.   Alcohol & Drug Services: [phone] Addiction Treatment: [phone]   Referrals ordered today:   Referral Orders         Ambulatory referral to Cardiology       I have ordered the following medication/changed the following medications:   Stop the following medications: Medications Discontinued During This Encounter  Medication Reason   doxycycline (VIBRAMYCIN) 100 MG capsule      Start the following medications: No orders of the defined types were placed in this encounter.    Follow up:  1-2 week    Should you have any questions or concerns please call the internal medicine clinic at [phone].    Rana Snare, D.O. [facility]

## 2021-08-27 NOTE — Assessment & Plan Note (Addendum)
Patient presents to clinic after ED visit for suicidal ideation. He was restarted on his Abilify 10 mg and Wellbutrin 300 mg. Today he reports feeling better since ED discharge. He has been able to work outside and states if he could "run a marathon I would". He does report increase appetite and late night cravings,which could be from restarting his Abilify or simply feeling better. Denies having SI today. PHQ-9 was 13, improved from last week.   He had 2 video sessions with [facility]. Plans to reach out to them for follow-up appointment.  It appears that [facility] was prescribing his Abilify and Wellbutrin before. I am contacting [facility] to clarify moving forward who will prescribe the meds.    Plan -Continue Abilify and Wellbutrin -f/u with [facility] -f/u visit in 1 week

## 2021-08-28 ENCOUNTER — Other Ambulatory Visit (HOSPITAL_COMMUNITY): Payer: Self-pay | Admitting: Physician Assistant

## 2021-08-29 NOTE — Progress Notes (Signed)
Internal Medicine Clinic Attending  I saw and evaluated the patient.  I personally confirmed the key portions of the history and exam documented by Dr. Zheng and I reviewed pertinent patient test results.  The assessment, diagnosis, and plan were formulated together and I agree with the documentation in the resident's note.  

## 2021-09-04 ENCOUNTER — Other Ambulatory Visit: Payer: Self-pay | Admitting: Student

## 2021-09-04 MED ORDER — ARIPIPRAZOLE 10 MG PO TABS
10.0000 mg | ORAL_TABLET | Freq: Every day | ORAL | 0 refills | Status: DC
Start: 2021-09-04 — End: 2021-10-07

## 2021-09-04 NOTE — Telephone Encounter (Signed)
MED REFILL REQUEST FROM [facility]   [place], [phone]   Aripiprazole 5 mg previously written by Dr. Elsie Saas

## 2021-09-05 ENCOUNTER — Other Ambulatory Visit (HOSPITAL_COMMUNITY)
Admission: EM | Admit: 2021-09-05 | Discharge: 2021-09-05 | Disposition: A | Discharge status: Home / Self Care | Payer: Commercial Managed Care - HMO | Attending: Psychiatry | Admitting: Psychiatry

## 2021-09-05 DIAGNOSIS — F1910 Other psychoactive substance abuse, uncomplicated: Secondary | ICD-10-CM | POA: Diagnosis present

## 2021-09-05 DIAGNOSIS — Z20822 Contact with and (suspected) exposure to covid-19: Secondary | ICD-10-CM | POA: Insufficient documentation

## 2021-09-05 LAB — POCT URINE DRUG SCREEN - MANUAL ENTRY (I-SCREEN)
POC Amphetamine UR: NOT DETECTED
POC Buprenorphine (BUP): NOT DETECTED
POC Cocaine UR: NOT DETECTED
POC Marijuana UR: NOT DETECTED
POC Methadone UR: NOT DETECTED
POC Methamphetamine UR: NOT DETECTED
POC Morphine: NOT DETECTED
POC Oxazepam (BZO): NOT DETECTED
POC Oxycodone UR: NOT DETECTED
POC Secobarbital (BAR): NOT DETECTED

## 2021-09-05 LAB — POC SARS CORONAVIRUS 2 AG: SARSCOV2ONAVIRUS 2 AG: NEGATIVE

## 2021-09-05 LAB — RESP PANEL BY RT-PCR (FLU A&B, COVID) ARPGX2
Influenza A by PCR: NEGATIVE
Influenza B by PCR: NEGATIVE
SARS Coronavirus 2 by RT PCR: NEGATIVE

## 2021-09-05 MED ORDER — ARIPIPRAZOLE 10 MG PO TABS: 10.0000 mg | ORAL_TABLET | Freq: Every day | ORAL | Status: DC

## 2021-09-05 MED ORDER — NALTREXONE HCL 50 MG PO TABS: 50.0000 mg | ORAL_TABLET | Freq: Every day | ORAL | Status: DC

## 2021-09-05 MED ORDER — BUPROPION HCL ER (XL) 300 MG PO TB24: 300.0000 mg | ORAL_TABLET | Freq: Every day | ORAL | Status: DC

## 2021-09-05 MED ORDER — MAGNESIUM HYDROXIDE 400 MG/5ML PO SUSP: 30.0000 mL | Freq: Every day | ORAL | Status: DC | PRN

## 2021-09-05 MED ORDER — ALUM & MAG HYDROXIDE-SIMETH 200-200-20 MG/5ML PO SUSP: 30.0000 mL | ORAL | Status: DC | PRN

## 2021-09-05 MED ORDER — ACETAMINOPHEN 325 MG PO TABS: 650.0000 mg | ORAL_TABLET | Freq: Four times a day (QID) | ORAL | Status: DC | PRN
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
## 2021-09-05 NOTE — ED Notes (Signed)
Patient was discharged by provider. Patient was given discharge instructions.

## 2021-09-05 NOTE — Discharge Instructions (Addendum)
You have been referred to the CDIOP program. A staff member should be reaching out to you to discuss the program.   Patient is instructed prior to discharge to: Take all medications as prescribed by his/her mental healthcare provider. Report any adverse effects and or reactions from the medicines to his/her outpatient provider promptly. Keep all scheduled appointments, to ensure that you are getting refills on time and to avoid any interruption in your medication.  If you are unable to keep an appointment call to reschedule.  Be sure to follow-up with resources and follow-up appointments provided.  Patient has been instructed & cautioned: To not engage in alcohol and or illegal drug use while on prescription medicines. In the event of worsening symptoms, patient is instructed to call the crisis hotline, 911 and or go to the nearest ED for appropriate evaluation and treatment of symptoms. To follow-up with his/her primary care provider for your other medical issues, concerns and or health care needs.

## 2021-09-08 NOTE — Progress Notes (Unsigned)
Cardiology Clinic Note   Patient Name: Raymond Mcclure Date of Encounter: 09/09/2021  Primary Care Provider:  Rana Snare, DO Primary Cardiologist:  Thomasene Ripple, DO  Patient Profile    Raymond Mcclure 56 year old male presents to the clinic today for his CAD and hypertension.  Past Medical History    Past Medical History:  Diagnosis Date   Cluster headaches    Drug abuse (HCC)    Migraines    Obesity    Tachycardia    Past Surgical History:  Procedure Laterality Date   LOOP RECORDER IMPLANT     WRIST SURGERY     nerve repair    Allergies  Allergies  Allergen Reactions   Bee Venom Anaphylaxis   Peanut Oil Anaphylaxis    + peanut butter    Peanut-Containing Drug Products Anaphylaxis    History of Present Illness    Raymond Mcclure is a PMH of mild CAD, hypertension, mood disorder, shortness of breath, dyslipidemia, obesity, anxiety, polysubstance abuse, suicidal ideation, and is status post radiofrequency ablation for cardiac arrhythmia.  He is status post implantable loop recorder 10/21.  He underwent cardiac catheterization 6/21 which showed mild nonobstructive CAD along his LAD.  He was seen by Dr. Servando Salina 01/27/2021.  During that time he reported experiencing intermittent periods of chest discomfort as well as palpitations.  The discomfort was noted along his mid sternum and was not usually with exertion.  He denied prolonged episodes.  He reported shortness of breath with exertion.  He also noted fatigue.  He presents to the clinic for follow-up evaluation states he underwent ablation procedure in January at [facility].  He reports since that time he is not taking beta-blocker therapy.  He has noted recently that he has increased work of breathing with increased physical activity.  He has been fairly sedentary for quite some time.  He denies orthopnea and PND.  I will repeat his echocardiogram and plan follow-up in 3 to 4 months.  I have asked him to increase  his physical activity as tolerated and follow a heart healthy low-sodium diet.  Today he denies chest pain,  lower extremity edema, fatigue, palpitations, melena, hematuria, hemoptysis, diaphoresis, weakness, presyncope, syncope, orthopnea, and PND.    Home Medications    Prior to Admission medications   Medication Sig Start Date End Date Taking? Authorizing Provider  acetaminophen (TYLENOL) 500 MG tablet Take 2,000 mg by mouth every 4 (four) hours as needed for moderate pain. Patient not taking: Reported on 08/20/2021    [provider]  ARIPiprazole (ABILIFY) 10 MG tablet Take 1 tablet (10 mg total) by mouth daily. 09/04/21 10/04/21  Atway, Rayann N, DO  atenolol (TENORMIN) 25 MG tablet Take 25 mg by mouth daily.    [provider]  buPROPion (WELLBUTRIN XL) 300 MG 24 hr tablet Take 1 tablet (300 mg total) by mouth daily. 08/22/21 09/21/21  Chales Abrahams, NP  dicyclomine (BENTYL) 20 MG tablet Take 1 tablet (20 mg total) by mouth 2 (two) times daily as needed for spasms. Patient not taking: Reported on 01/27/2021 05/27/20   Carroll Sage, PA-C  diltiazem (CARDIZEM CD) 360 MG 24 hr capsule Take 1 capsule (360 mg total) by mouth daily. Patient not taking: Reported on 08/20/2021 01/27/21   Tobb, Lavona Mound, DO  diltiazem (DILACOR XR) 240 MG 24 hr capsule Take 240 mg by mouth daily.    [provider]  isosorbide mononitrate (IMDUR) 30 MG 24 hr  tablet Take 1 tablet (30 mg total) by mouth daily. 01/27/21   Tobb, Kardie, DO  meclizine (ANTIVERT) 25 MG tablet Take 1 tablet (25 mg total) by mouth 3 (three) times daily as needed for dizziness. Patient not taking: Reported on 08/20/2021 07/29/21   Tanda Rockers A, DO  metoprolol succinate (TOPROL-XL) 25 MG 24 hr tablet Take 1 tablet (25 mg total) by mouth daily. 01/31/21 08/20/21  Cheryll Cockayne, MD  naltrexone (DEPADE) 50 MG tablet Take 1 tablet (50 mg total) by mouth daily. 06/12/21   Nwoko, Tommas Olp, PA  naproxen (NAPROSYN) 500  MG tablet Take 1 tablet (500 mg total) by mouth 2 (two) times daily. Patient not taking: Reported on 01/27/2021 08/13/20   Tanda Rockers, PA-C  omeprazole (PRILOSEC) 20 MG capsule Take 1 capsule (20 mg total) by mouth daily. 08/17/21 09/16/21  Ellsworth Lennox, PA-C  ondansetron (ZOFRAN) 4 MG tablet Take 1 tablet (4 mg total) by mouth every 8 (eight) hours as needed for nausea or vomiting. 08/17/21   Ellsworth Lennox, PA-C    Family History    No family history on file. He indicated that his mother is alive. He indicated that his father is deceased.  Social History    Social History   Socioeconomic History   Marital status: Legally Separated    Spouse name: Not on file   Number of children: Not on file   Years of education: Not on file   Highest education level: Not on file  Occupational History   Not on file  Tobacco Use   Smoking status: Some Days    Types: Cigars   Smokeless tobacco: Never   Tobacco comments:    Couple of days .  Smokes when stressed.  Vaping Use   Vaping Use: Never used  Substance and Sexual Activity   Alcohol use: No   Drug use: Not Currently    Comment: Fentanyl   Sexual activity: Not on file    Comment: crack  Other Topics Concern   Not on file  Social History Narrative   Not on file   Social Determinants of Health   Financial Resource Strain: Not on file  Food Insecurity: Not on file  Transportation Needs: Not on file  Physical Activity: Not on file  Stress: Not on file  Social Connections: Not on file  Intimate Partner Violence: Not on file     Review of Systems    General:  No chills, fever, night sweats or weight changes.  Cardiovascular:  No chest pain, dyspnea on exertion, edema, orthopnea, palpitations, paroxysmal nocturnal dyspnea. Dermatological: No rash, lesions/masses Respiratory: No cough, dyspnea Urologic: No hematuria, dysuria Abdominal:   No nausea, vomiting, diarrhea, bright red blood per rectum, melena, or  hematemesis Neurologic:  No visual changes, wkns, changes in mental status. All other systems reviewed and are otherwise negative except as noted above.  Physical Exam    VS:  BP (!) 130/94   Pulse 83   Ht 5\' 10"  (1.778 m)   Wt 251 lb 12.8 oz (114.2 kg)   SpO2 99%   BMI 36.13 kg/m  , BMI Body mass index is 36.13 kg/m. GEN: Well nourished, well developed, in no acute distress. HEENT: normal. Neck: Supple, no JVD, carotid bruits, or masses. Cardiac: RRR, no murmurs, rubs, or gallops. No clubbing, cyanosis, edema.  Radials/DP/PT 2+ and equal bilaterally.  Respiratory:  Respirations regular and unlabored, clear to auscultation bilaterally. GI: Soft, nontender, nondistended, BS + x 4. MS: no  deformity or atrophy. Skin: warm and dry, no rash. Neuro:  Strength and sensation are intact. Psych: Normal affect.  Accessory Clinical Findings    Recent Labs: 01/05/2021: B Natriuretic Peptide 26.5 08/20/2021: ALT 20; BUN 11; Creatinine, Ser 1.09; Hemoglobin 14.9; Platelets 313; Potassium 4.0; Sodium 140   Recent Lipid Panel    Component Value Date/Time   CHOL  07/15/2006 0405    147        ATP III CLASSIFICATION:  <200     mg/dL   Desirable  128-786  mg/dL   Borderline High  >=767    mg/dL   High   TRIG 84 20/94/7096 0405   HDL 28 (L) 07/15/2006 0405   CHOLHDL 5.3 07/15/2006 0405   VLDL 17 07/15/2006 0405   LDLCALC (H) 07/15/2006 0405    102        Total Cholesterol/HDL:CHD Risk Coronary Heart Disease Risk Table                     Men   Women  1/2 Average Risk   3.4   3.3    ECG personally reviewed by me today-none today.  Echocardiogram 07/14/2006 LEFT VENTRICLE:   -  Left ventricular size was normal.   -  Overall left ventricular systolic function was normal.   -  Left ventricular ejection fraction was estimated , range being 60         % to 65 %.   -  There were no left ventricular regional wall motion         abnormalities.   -  Left ventricular wall thickness was  normal.    AORTIC VALVE:   -  The aortic valve was trileaflet.   -  Aortic valve thickness was normal.   -  There was normal aortic valve leaflet excursion.     Doppler interpretation(s):   -  There was no significant aortic valvular regurgitation.    AORTA:   -  The aortic root was normal in size.    MITRAL VALVE:   -  Mitral valve structure was normal.     Doppler interpretation(s):   -  The transmitral velocity was within the normal range.   -  There was no significant mitral valvular regurgitation.    LEFT ATRIUM:   -  Left atrial size was normal.    RIGHT VENTRICLE:   -  Right ventricular size was normal.   -  Right ventricular systolic function was normal.   -  Right ventricular wall thickness was normal.   -  The estimated peak right ventricular systolic pressure was within         normal range.    PULMONIC VALVE:   -  The structure of the pulmonic valve appeared to be normal.     Doppler interpretation(s):   -  There was mild pulmonic regurgitation.    TRICUSPID VALVE:   -  The tricuspid valve structure was normal.     Doppler interpretation(s):   -  There was mild tricuspid valvular regurgitation.    RIGHT ATRIUM:   -  Right atrial size was normal.    PERICARDIUM:   -  There was no pericardial effusion.     ---------------------------------------------------------------    SUMMARY   -  Overall left ventricular systolic function was normal. Left         ventricular ejection fraction was estimated , range being 60         %  to 65 %. There were no left ventricular regional wall         motion abnormalities.    Assessment & Plan   1.  Coronary artery disease-no chest pain today.  Continues to have occasional episodes of chest discomfort that are brief in nature and resolve without intervention.  Underwent cardiac catheterization 6/21 which showed mild nonobstructive coronary disease. Heart healthy low-sodium diet-salty 6 given Increase physical  activity as tolerated  Shortness of breath, DOE-continues to notice increased work of breathing with increased physical activity.  Appears to be related to deconditioning. Will order echocardiogram to evaluate LVEF Order BMP, CBC  SVT-no recent episodes of increased or irregular heartbeat.  He is status post ablation.  Had implantable loop recorder placed 10/21. Avoid triggers caffeine, chocolate, EtOH, dehydration, recreational drug use etc. Increase physical activity as tolerated  Hyperlipidemia-LDL 102 on 07/15/2006 Heart healthy low-sodium high-fiber diet Increase physical activity as tolerated Follows with PCP  Obesity-weight today 251.8 pounds.  Increase physical activity as tolerated Continue weight loss  Disposition: Follow-up with Dr. Servando Salina or APP in 3-4 months.   Thomasene Ripple. Annaleise Burger NP-C     09/09/2021, 4:24 PM [facility] [address] Office [phone] Fax [phone]  Notice: This dictation was prepared with Dragon dictation along with smaller phrase technology. Any transcriptional errors that result from this process are unintentional and may not be corrected upon review.  I spent 14 minutes examining this patient, reviewing medications, and using patient centered shared decision making involving her cardiac care.  Prior to her visit I spent greater than 20 minutes reviewing her past medical history,  medications, and prior cardiac tests.

## 2021-09-09 ENCOUNTER — Ambulatory Visit (INDEPENDENT_AMBULATORY_CARE_PROVIDER_SITE_OTHER): Payer: Commercial Managed Care - HMO | Admitting: General Practice

## 2021-09-09 ENCOUNTER — Encounter: Payer: Self-pay | Admitting: General Practice

## 2021-09-09 ENCOUNTER — Other Ambulatory Visit: Payer: Self-pay

## 2021-09-09 ENCOUNTER — Encounter: Payer: Self-pay | Admitting: Student

## 2021-09-09 ENCOUNTER — Ambulatory Visit (INDEPENDENT_AMBULATORY_CARE_PROVIDER_SITE_OTHER): Payer: Commercial Managed Care - HMO | Admitting: Student

## 2021-09-09 VITALS — BP 130/94 | HR 83 | Ht 70.0 in | Wt 251.8 lb

## 2021-09-09 DIAGNOSIS — R0609 Other forms of dyspnea: Secondary | ICD-10-CM

## 2021-09-09 DIAGNOSIS — E785 Hyperlipidemia, unspecified: Secondary | ICD-10-CM

## 2021-09-09 DIAGNOSIS — F17210 Nicotine dependence, cigarettes, uncomplicated: Secondary | ICD-10-CM | POA: Diagnosis not present

## 2021-09-09 DIAGNOSIS — R0602 Shortness of breath: Secondary | ICD-10-CM | POA: Diagnosis not present

## 2021-09-09 DIAGNOSIS — E669 Obesity, unspecified: Secondary | ICD-10-CM

## 2021-09-09 DIAGNOSIS — I10 Essential (primary) hypertension: Secondary | ICD-10-CM | POA: Diagnosis not present

## 2021-09-09 DIAGNOSIS — I2510 Atherosclerotic heart disease of native coronary artery without angina pectoris: Secondary | ICD-10-CM | POA: Diagnosis not present

## 2021-09-09 DIAGNOSIS — I471 Supraventricular tachycardia: Secondary | ICD-10-CM | POA: Diagnosis not present

## 2021-09-09 DIAGNOSIS — F3132 Bipolar disorder, current episode depressed, moderate: Secondary | ICD-10-CM

## 2021-09-09 NOTE — Patient Instructions (Signed)
Medication Instructions:  The current medical regimen is effective;  continue present plan and medications as directed. Please refer to the Current Medication list given to you today.   *If you need a refill on your cardiac medications before your next appointment, please call your pharmacy*  Lab Work:    BMET&CBC TODAY   If you have labs (blood work) drawn today and your tests are completely normal, you will receive your results only by: MyChart Message (if you have MyChart) OR  A paper copy in the mail If you have any lab test that is abnormal or we need to change your treatment, we will call you to review the results.  Testing/Procedures:  Echocardiogram - Your physician has requested that you have an echocardiogram. Echocardiography is a painless test that uses sound waves to create images of your heart. It provides your doctor with information about the size and shape of your heart and how well your heart's chambers and valves are working. This procedure takes approximately one hour. There are no restrictions for this procedure.    Follow-Up: Your next appointment:  AFTER TESTING  In Person with Edd Fabian, FNP      At [facility], you and your health needs are our priority.  As part of our continuing mission to provide you with exceptional heart care, we have created designated Provider Care Teams.  These Care Teams include your primary Cardiologist (physician) and Advanced Practice Providers (APPs -  Physician Assistants and Nurse Practitioners) who all work together to provide you with the care you need, when you need it. Important Information About Sugar

## 2021-09-09 NOTE — Patient Instructions (Signed)
  Thank you, [name], for allowing [place] to provide your care today. Today we discussed . . .  > Depression       - we have provided you with the information for [facility], which is a resource that can help with your depression  > Blood Pressure       - ask your cardiologist today about your blood pressure medications, today in clinic your BP was good  > Self-Harm       - Please call us or visit [facility] service if you have any thoughts of harming yourself   [facility] [address] [place], [place] [address] [phone]     I have ordered the following labs for you:  Lab Orders  No laboratory test(s) ordered today      Tests ordered today:  None   Referrals ordered today:   Referral Orders  No referral(s) requested today      I have ordered the following medication/changed the following medications:   Stop the following medications: There are no discontinued medications.   Start the following medications: No orders of the defined types were placed in this encounter.     Follow up: 3 months    Remember: Please contact [facility] given the number that we provided. You can set up an appointment, which should help improve your mood. We will see you in about 3 months, but please do not hesitate to call if you want to see [place] sooner.   Should you have any questions or concerns please call the internal medicine clinic at [phone].     Lajuana Ripple, MD [facility]

## 2021-09-09 NOTE — Progress Notes (Signed)
     CC: Follow-up for Depression  HPI:   [name] is a 56 y.o. male with a past medical history of hypertension, polysubstance use, CAD, and bipolar affective disorder who presents for depression and hypertension follow-up. He was last seen in clinic on 7-27 and visited Urgent Care on 8-5 for exacerbated depression.    Past Medical History:  Diagnosis Date   Cluster headaches    Drug abuse (HCC)    Migraines    Obesity    Tachycardia      Review of Systems:    Reports dizziness and lightheadness with abrupt positional changes, dry mouth, occasional night sweats Denies fever, chills, nausea, vomiting, breath shortness, vomiting, diarrhea    Physical Exam:  Vitals:   09/09/21 1025 09/09/21 1032  BP:  115/82  Pulse:  70  Temp:  97.8 F (36.6 C)  TempSrc:  Oral  SpO2:  100%  Weight: 254 lb 6.4 oz (115.4 kg) 254 lb 6.4 oz (115.4 kg)  Height: 5\' 10"  (1.778 m) 5\' 10"  (1.778 m)    General:   awake and alert, sitting comfortably in chair, cooperative, not in acute distress Skin:   warm and dry, intact without any obvious lesions or scars, no rashes or lesions  Lungs:   normal respiratory effort, breathing unlabored, symmetrical chest rise, no crackles or wheezing Cardiac:   regular rate and rhythm, normal S1 and S2 Psychiatric:   slightly depressed mood, affect normal, intelligible speech    Assessment & Plan:   Bipolar affective disorder, currently depressed, moderate (HCC) Patient has a long history of depression with suicidal ideation including 3 prior suicide attempts. He has had a few virtual meetings with [facility], but has been unable to successfully set up an in person visit.  He has to arrive facility at least an hour before they open and only about 3 people are given in-person visits each day.  He is currently taking aripiprazole and bupropion and takes them fairly consistently, stating that he misses only about 1 dose per week. Bupropion was recently  increased to 300 mg daily.  He reports some dizziness and lightheadedness when standing or sitting up.  He also reports dry mouth and night sweats.  He denies fever, chills, or recent illnesses.  He is also drinking a lot of water, about 12 bottles per day.  He does not feel confident that the bupropion is helpful, but reports an improved mood since his last visit.  Patient is amenable to additional mental health resources including [facility].  -Continue aripiprazole and bupropion at the current doses -Consider transitioning aripiprazole to bubble pack dispenser to improve adherence -Provide [employer] information for additional psychological support   Primary hypertension Patient has a history of hypertension and has previously been on atenolol, diltiazem, and metoprolol.  Is unclear whether he was on these medications simultaneously.  His last visit with on July 27, he was instructed to hold all hypertensive medications until his next visit.  Patient has done this, and today his blood pressure was normal 115/82.  He has also been taking his blood pressure at home, which range from 110-120s/80-90s.  His values were lower when he was taking his blood pressure medications. He has an appointment with his cardiologist later today.  -Continue to withhold blood pressure medications and follow recommendations by cardiologist     See Encounters Tab for problem based charting.  Patient seen with Dr. [place]

## 2021-09-09 NOTE — Assessment & Plan Note (Addendum)
Patient has a long history of depression with suicidal ideation including 3 prior suicide attempts. He has had a few virtual meetings with [facility], but has been unable to successfully set up an in person visit.  He has to arrive facility at least an hour before they open and only about 3 people are given in-person visits each day.  He is currently taking aripiprazole and bupropion and takes them fairly consistently, stating that he misses only about 1 dose per week. Bupropion was recently increased to 300 mg daily.  He reports some dizziness and lightheadedness when standing or sitting up.  He also reports dry mouth and night sweats.  He denies fever, chills, or recent illnesses.  He is also drinking a lot of water, about 12 bottles per day.  He does not feel confident that the bupropion is helpful, but reports an improved mood since his last visit.  Patient is amenable to additional mental health resources including [employer].  -Continue aripiprazole and bupropion at the current doses -Consider transitioning aripiprazole to bubble pack dispenser to improve adherence -Provide [employer] information for additional psychological support

## 2021-09-09 NOTE — Assessment & Plan Note (Addendum)
Patient has a history of hypertension and has previously been on atenolol, diltiazem, and metoprolol.  Is unclear whether he was on these medications simultaneously.  His last visit with [place] on July 27, he was instructed to hold all hypertensive medications until his next visit.  Patient has done this, and today his blood pressure was normal 115/82.  He has also been taking his blood pressure at home, which range from 110-120s/80-90s.  His values were lower when he was taking his blood pressure medications. He has an appointment with his cardiologist later today.  -Continue to withhold blood pressure medications and follow recommendations by cardiologist

## 2021-09-10 NOTE — Addendum Note (Signed)
Addended by: Earl Lagos on: 09/10/2021 10:42 AM   Modules accepted: Level of Service

## 2021-09-10 NOTE — Progress Notes (Signed)
Internal Medicine Clinic Attending  I saw and evaluated the patient.  I personally confirmed the key portions of the history and exam documented by Dr. Harper and I reviewed pertinent patient test results.  The assessment, diagnosis, and plan were formulated together and I agree with the documentation in the resident's note.  

## 2021-09-14 ENCOUNTER — Telehealth: Payer: Self-pay | Admitting: Licensed Clinical Social Worker

## 2021-09-14 NOTE — Telephone Encounter (Signed)
H&V Care Navigation CSW Progress Note  Clinical Social Worker contacted patient by phone to f/u on transportation request. LCSW able to reach pt at [phone]. Introduced self, role, reason for call. Pt confirmed he does not have transportation to upcoming appts for echo and f/u at [facility]- he is able to utilize bus to get to appts. He is agreeable to me sending the routes along with bus passes to utilize strictly for those appts. No additional concerns except that he shares he experienced hives on his arm after taking medications- he is curious if this could be caused by any of his medications. I will pass on to pharmacy pool to see if they can review this with him.   Patient is participating in a Managed Medicaid Plan:  No, commercial insurance only Counselling psychologist)  SDOH Screenings   Alcohol Screen: Not on file  Depression (PHQ2-9): High Risk (09/09/2021)   Depression (PHQ2-9)    PHQ-2 Score: 15  Financial Resource Strain: Not on file  Food Insecurity: Not on file  Housing: Not on file  Physical Activity: Not on file  Social Connections: Not on file  Stress: Not on file  Tobacco Use: High Risk (09/09/2021)   Patient History    Smoking Tobacco Use: Some Days    Smokeless Tobacco Use: Never    Passive Exposure: Not on file  Transportation Needs: Unmet Transportation Needs (09/14/2021)   PRAPARE - Administrator, Civil Service (Medical): Yes    Lack of Transportation (Non-Medical): Yes   Octavio Graves, MSW, LCSW Clinical Social Worker II [facility] Navigation  [phone]- work cell phone (preferred) [phone]- desk phone

## 2021-09-15 ENCOUNTER — Other Ambulatory Visit: Payer: Self-pay | Admitting: Internal Medicine

## 2021-09-15 ENCOUNTER — Telehealth (HOSPITAL_COMMUNITY): Payer: Self-pay | Admitting: Student

## 2021-09-15 LAB — BASIC METABOLIC PANEL
BUN/Creatinine Ratio: 13 (ref 9–20)
BUN: 13 mg/dL (ref 6–24)
CO2: 23 mmol/L (ref 20–29)
Calcium: 9.4 mg/dL (ref 8.7–10.2)
Chloride: 104 mmol/L (ref 96–106)
Creatinine, Ser: 1 mg/dL (ref 0.76–1.27)
Glucose: 100 mg/dL — ABNORMAL HIGH (ref 70–99)
Potassium: 4 mmol/L (ref 3.5–5.2)
Sodium: 139 mmol/L (ref 134–144)
eGFR: 90 mL/min/{1.73_m2} (ref >59)

## 2021-09-15 LAB — CBC
Hematocrit: 43.3 % (ref 37.5–51.0)
Hemoglobin: 14.6 g/dL (ref 13.0–17.7)
MCH: 31.7 pg (ref 26.6–33.0)
MCHC: 33.7 g/dL (ref 31.5–35.7)
MCV: 94 fL (ref 79–97)
Platelets: 350 10*3/uL (ref 150–450)
RBC: 4.61 x10E6/uL (ref 4.14–5.80)
RDW: 12.3 % (ref 11.6–15.4)
WBC: 11.9 10*3/uL — ABNORMAL HIGH (ref 3.4–10.8)

## 2021-09-15 NOTE — BH Assessment (Signed)
Care Management - [facility] Follow Up Discharges  ° °Writer attempted to make contact with patient today and was unsuccessful.  Writer left a HIPPA compliant voice message.  ° °Per chart review, patient was provided with outpatient resources. ° °

## 2021-09-21 ENCOUNTER — Other Ambulatory Visit (HOSPITAL_COMMUNITY): Payer: Self-pay

## 2021-09-21 ENCOUNTER — Other Ambulatory Visit: Payer: Self-pay

## 2021-09-25 ENCOUNTER — Ambulatory Visit (HOSPITAL_COMMUNITY): Payer: Commercial Managed Care - HMO | Attending: Cardiology

## 2021-09-25 ENCOUNTER — Encounter (HOSPITAL_COMMUNITY): Payer: Self-pay | Admitting: General Practice

## 2021-09-28 NOTE — Progress Notes (Deleted)
Cardiology Clinic Note   Patient Name: Raymond Mcclure Date of Encounter: 09/28/2021  Primary Care Provider:  Rana Snare, DO Primary Cardiologist:  Thomasene Ripple, DO  Patient Profile    Raymond Mcclure 56 year old male presents to the clinic today for his CAD and hypertension.  Past Medical History    Past Medical History:  Diagnosis Date   Cluster headaches    Drug abuse (HCC)    Migraines    Obesity    Tachycardia    Past Surgical History:  Procedure Laterality Date   LOOP RECORDER IMPLANT     WRIST SURGERY     nerve repair    Allergies  Allergies  Allergen Reactions   Bee Venom Anaphylaxis   Peanut Oil Anaphylaxis    + peanut butter    Peanut-Containing Drug Products Anaphylaxis    History of Present Illness    Raymond Mcclure is a PMH of mild CAD, hypertension, mood disorder, shortness of breath, dyslipidemia, obesity, anxiety, polysubstance abuse, suicidal ideation, and is status post radiofrequency ablation for cardiac arrhythmia.  He is status post implantable loop recorder 10/21.  He underwent cardiac catheterization 6/21 which showed mild nonobstructive CAD along his LAD.  He was seen by Dr. Servando Salina 01/27/2021.  During that time he reported experiencing intermittent periods of chest discomfort as well as palpitations.  The discomfort was noted along his mid sternum and was not usually with exertion.  He denied prolonged episodes.  He reported shortness of breath with exertion.  He also noted fatigue.  He presented to the clinic 09/09/2021 for follow-up evaluation stated he underwent ablation procedure in January at [facility].  He reported since that time he was not taking beta-blocker therapy.  He had noted recently that he had increased work of breathing with increased physical activity.  He had been fairly sedentary for quite some time.  He denied orthopnea and PND.  I  repeated his echocardiogram and planned follow-up in 3 to 4 months.  I have asked  him to increase his physical activity as tolerated and follow a heart healthy low-sodium diet.  His echocardiogram was not completed.  He presents to the clinic today for follow-up evaluation states***  Today he denies chest pain,  lower extremity edema, fatigue, palpitations, melena, hematuria, hemoptysis, diaphoresis, weakness, presyncope, syncope, orthopnea, and PND.   Coronary artery disease-no chest pain today.  Reports occasional brief episodes of chest discomfort that resolved without intervention.  Cardiac catheterization 6/21 which showed mild nonobstructive coronary disease. Heart healthy low-sodium diet-salty 6 reviewed. Increase physical activity as tolerated  Shortness of breath, DOE-continues to notice increased work of breathing with increased physical activity.  Appears to be related to deconditioning.  BMP 09/14/2021 unremarkable, CBC showed slightly elevated white count but was otherwise normal. Will reorder echocardiogram to evaluate LVEF   SVT/palpitations-last recent episodes of increased or irregular heartbeat.  He is status post ablation.  With implantable loop recorder placed 10/21. Avoid triggers caffeine, chocolate, EtOH, dehydration, recreational drug use etc. Increase physical activity as tolerated  Obesity-weight today 251.8*** pounds.  Increase physical activity as tolerated Continue weight loss  Disposition: Follow-up with Dr. Servando Salina or APP in 6-9 months.   Home Medications    Prior to Admission medications   Medication Sig Start Date End Date Taking? Authorizing Provider  acetaminophen (TYLENOL) 500 MG tablet Take 2,000 mg by mouth every 4 (four) hours as needed for moderate pain. Patient not taking: Reported on 08/20/2021  [provider]  ARIPiprazole (ABILIFY) 10 MG tablet Take 1 tablet (10 mg total) by mouth daily. 09/04/21 10/04/21  Atway, Rayann N, DO  atenolol (TENORMIN) 25 MG tablet Take 25 mg by mouth daily.    [provider]   buPROPion (WELLBUTRIN XL) 300 MG 24 hr tablet Take 1 tablet (300 mg total) by mouth daily. 08/22/21 09/21/21  Chales Abrahams, NP  dicyclomine (BENTYL) 20 MG tablet Take 1 tablet (20 mg total) by mouth 2 (two) times daily as needed for spasms. Patient not taking: Reported on 01/27/2021 05/27/20   Carroll Sage, PA-C  diltiazem (CARDIZEM CD) 360 MG 24 hr capsule Take 1 capsule (360 mg total) by mouth daily. Patient not taking: Reported on 08/20/2021 01/27/21   Tobb, Lavona Mound, DO  diltiazem (DILACOR XR) 240 MG 24 hr capsule Take 240 mg by mouth daily.    [provider]  isosorbide mononitrate (IMDUR) 30 MG 24 hr tablet Take 1 tablet (30 mg total) by mouth daily. 01/27/21   Tobb, Kardie, DO  meclizine (ANTIVERT) 25 MG tablet Take 1 tablet (25 mg total) by mouth 3 (three) times daily as needed for dizziness. Patient not taking: Reported on 08/20/2021 07/29/21   Tanda Rockers A, DO  metoprolol succinate (TOPROL-XL) 25 MG 24 hr tablet Take 1 tablet (25 mg total) by mouth daily. 01/31/21 08/20/21  Cheryll Cockayne, MD  naltrexone (DEPADE) 50 MG tablet Take 1 tablet (50 mg total) by mouth daily. 06/12/21   Nwoko, Tommas Olp, PA  naproxen (NAPROSYN) 500 MG tablet Take 1 tablet (500 mg total) by mouth 2 (two) times daily. Patient not taking: Reported on 01/27/2021 08/13/20   Tanda Rockers, PA-C  omeprazole (PRILOSEC) 20 MG capsule Take 1 capsule (20 mg total) by mouth daily. 08/17/21 09/16/21  Ellsworth Lennox, PA-C  ondansetron (ZOFRAN) 4 MG tablet Take 1 tablet (4 mg total) by mouth every 8 (eight) hours as needed for nausea or vomiting. 08/17/21   Ellsworth Lennox, PA-C    Family History    No family history on file. He indicated that his mother is alive. He indicated that his father is deceased.  Social History    Social History   Socioeconomic History   Marital status: Legally Separated    Spouse name: Not on file   Number of children: Not on file   Years of education: Not on file   Highest  education level: Not on file  Occupational History   Not on file  Tobacco Use   Smoking status: Some Days    Types: Cigars   Smokeless tobacco: Never   Tobacco comments:    Couple of days .  Smokes when stressed.  Vaping Use   Vaping Use: Never used  Substance and Sexual Activity   Alcohol use: No   Drug use: Not Currently    Comment: Fentanyl   Sexual activity: Not on file    Comment: crack  Other Topics Concern   Not on file  Social History Narrative   Not on file   Social Determinants of Health   Financial Resource Strain: Not on file  Food Insecurity: Not on file  Transportation Needs: Unmet Transportation Needs (09/14/2021)   PRAPARE - Administrator, Civil Service (Medical): Yes    Lack of Transportation (Non-Medical): Yes  Physical Activity: Not on file  Stress: Not on file  Social Connections: Not on file  Intimate Partner Violence: Not on file     Review of  Systems    General:  No chills, fever, night sweats or weight changes.  Cardiovascular:  No chest pain, dyspnea on exertion, edema, orthopnea, palpitations, paroxysmal nocturnal dyspnea. Dermatological: No rash, lesions/masses Respiratory: No cough, dyspnea Urologic: No hematuria, dysuria Abdominal:   No nausea, vomiting, diarrhea, bright red blood per rectum, melena, or hematemesis Neurologic:  No visual changes, wkns, changes in mental status. All other systems reviewed and are otherwise negative except as noted above.  Physical Exam    VS:  There were no vitals taken for this visit. , BMI There is no height or weight on file to calculate BMI. GEN: Well nourished, well developed, in no acute distress. HEENT: normal. Neck: Supple, no JVD, carotid bruits, or masses. Cardiac: RRR, no murmurs, rubs, or gallops. No clubbing, cyanosis, edema.  Radials/DP/PT 2+ and equal bilaterally.  Respiratory:  Respirations regular and unlabored, clear to auscultation bilaterally. GI: Soft, nontender,  nondistended, BS + x 4. MS: no deformity or atrophy. Skin: warm and dry, no rash. Neuro:  Strength and sensation are intact. Psych: Normal affect.  Accessory Clinical Findings    Recent Labs: 01/05/2021: B Natriuretic Peptide 26.5 08/20/2021: ALT 20 09/14/2021: BUN 13; Creatinine, Ser 1.00; Hemoglobin 14.6; Platelets 350; Potassium 4.0; Sodium 139   Recent Lipid Panel    Component Value Date/Time   CHOL  07/15/2006 0405    147        ATP III CLASSIFICATION:  <200     mg/dL   Desirable  200-239  mg/dL   Borderline High  >=240    mg/dL   High   TRIG 84 07/15/2006 0405   HDL 28 (L) 07/15/2006 0405   CHOLHDL 5.3 07/15/2006 0405   VLDL 17 07/15/2006 0405   LDLCALC (H) 07/15/2006 0405    102        Total Cholesterol/HDL:CHD Risk Coronary Heart Disease Risk Table                     Men   Women  1/2 Average Risk   3.4   3.3    ECG personally reviewed by me today-none today.  Echocardiogram 07/14/2006 LEFT VENTRICLE:   -  Left ventricular size was normal.   -  Overall left ventricular systolic function was normal.   -  Left ventricular ejection fraction was estimated , range being 60         % to 65 %.   -  There were no left ventricular regional wall motion         abnormalities.   -  Left ventricular wall thickness was normal.    AORTIC VALVE:   -  The aortic valve was trileaflet.   -  Aortic valve thickness was normal.   -  There was normal aortic valve leaflet excursion.     Doppler interpretation(s):   -  There was no significant aortic valvular regurgitation.    AORTA:   -  The aortic root was normal in size.    MITRAL VALVE:   -  Mitral valve structure was normal.     Doppler interpretation(s):   -  The transmitral velocity was within the normal range.   -  There was no significant mitral valvular regurgitation.    LEFT ATRIUM:   -  Left atrial size was normal.    RIGHT VENTRICLE:   -  Right ventricular size was normal.   -  Right ventricular systolic  function was normal.   -  Right ventricular wall thickness was normal.   -  The estimated peak right ventricular systolic pressure was within         normal range.    PULMONIC VALVE:   -  The structure of the pulmonic valve appeared to be normal.     Doppler interpretation(s):   -  There was mild pulmonic regurgitation.    TRICUSPID VALVE:   -  The tricuspid valve structure was normal.     Doppler interpretation(s):   -  There was mild tricuspid valvular regurgitation.    RIGHT ATRIUM:   -  Right atrial size was normal.    PERICARDIUM:   -  There was no pericardial effusion.     ---------------------------------------------------------------    SUMMARY   -  Overall left ventricular systolic function was normal. Left         ventricular ejection fraction was estimated , range being 60         % to 65 %. There were no left ventricular regional wall         motion abnormalities.    Assessment & Plan   1.  ***   Jossie Ng. Lattie Cervi NP-C     09/28/2021, 3:06 PM [place] [address] Office [phone] Fax [phone]  Notice: This dictation was prepared with Dragon dictation along with smaller phrase technology. Any transcriptional errors that result from this process are unintentional and may not be corrected upon review.  I spent 14 minutes examining this patient, reviewing medications, and using patient centered shared decision making involving her cardiac care.  Prior to her visit I spent greater than 20 minutes reviewing her past medical history,  medications, and prior cardiac tests.

## 2021-09-29 ENCOUNTER — Ambulatory Visit: Payer: Commercial Managed Care - HMO | Admitting: General Practice

## 2021-10-06 NOTE — Telephone Encounter (Signed)
H&V Care Navigation CSW Progress Note  Noted pt no showed and cancelled appts with Heartcare and imaging. Had been assisted with transportation passes. I remain available should pt reschedule appts.   Patient is participating in a Managed Medicaid Plan:  No, [employer] commercial plan  SDOH Screenings   Transportation Needs: Unmet Transportation Needs (09/14/2021)  Depression (PHQ2-9): High Risk (09/09/2021)  Tobacco Use: High Risk (09/09/2021)    Octavio Graves, MSW, LCSW Clinical Social Worker II [facility] Heart/Vascular Care Navigation  [phone]- work cell phone (preferred) [phone]- desk phone

## 2021-10-07 ENCOUNTER — Other Ambulatory Visit: Payer: Self-pay

## 2021-10-08 MED ORDER — ARIPIPRAZOLE 10 MG PO TABS: 10.0000 mg | ORAL_TABLET | Freq: Every day | ORAL | 0 refills | Status: DC

## 2021-10-09 ENCOUNTER — Other Ambulatory Visit: Payer: Self-pay | Admitting: Student

## 2021-10-23 NOTE — Telephone Encounter (Signed)
Patient was instructed to hold antihypertensive meds and they are no longer on med list. Antivert was sent by outside Provider on 6/28 for 10 tabs only. Call placed to patient for clarification. Recording states call cannot be made at this time.

## 2021-11-04 ENCOUNTER — Encounter: Payer: Self-pay | Admitting: *Deleted

## 2021-11-16 ENCOUNTER — Other Ambulatory Visit: Payer: Self-pay | Admitting: Student

## 2021-11-16 NOTE — Telephone Encounter (Signed)
Next appt scheduled 11/9 with PCP.  

## 2021-12-10 ENCOUNTER — Encounter: Payer: Commercial Managed Care - HMO | Admitting: Student

## 2022-01-21 ENCOUNTER — Other Ambulatory Visit: Payer: Self-pay

## 2022-02-08 DIAGNOSIS — R0789 Other chest pain: Secondary | ICD-10-CM | POA: Diagnosis not present

## 2022-02-08 DIAGNOSIS — R002 Palpitations: Secondary | ICD-10-CM | POA: Diagnosis not present

## 2022-02-08 DIAGNOSIS — I471 Supraventricular tachycardia, unspecified: Secondary | ICD-10-CM | POA: Diagnosis not present

## 2022-02-08 DIAGNOSIS — R0609 Other forms of dyspnea: Secondary | ICD-10-CM | POA: Diagnosis not present

## 2022-02-10 ENCOUNTER — Ambulatory Visit (INDEPENDENT_AMBULATORY_CARE_PROVIDER_SITE_OTHER): Payer: 59 | Admitting: Student

## 2022-02-10 ENCOUNTER — Encounter: Payer: Self-pay | Admitting: Student

## 2022-02-10 VITALS — BP 128/82 | HR 79 | Temp 98.0°F | Ht 70.0 in | Wt 264.4 lb

## 2022-02-10 DIAGNOSIS — R918 Other nonspecific abnormal finding of lung field: Secondary | ICD-10-CM

## 2022-02-10 DIAGNOSIS — R0789 Other chest pain: Secondary | ICD-10-CM

## 2022-02-10 NOTE — Progress Notes (Signed)
CC: Atypical chest pain  HPI:  [name] is a 57 y.o. male living with a history stated below and presents today for atypical chest pain for the last 3 years. Please see problem based assessment and plan for additional details.  Past Medical History:  Diagnosis Date   Cluster headaches    Drug abuse ([place])    Migraines    Obesity    Tachycardia     Current Outpatient Medications on File Prior to Visit  Medication Sig Dispense Refill   ARIPiprazole (ABILIFY) 10 MG tablet TAKE 1 TABLET BY MOUTH DAILY 30 tablet 10   buPROPion (WELLBUTRIN XL) 300 MG 24 hr tablet Take 1 tablet (300 mg total) by mouth daily. 30 tablet 0   meclizine (ANTIVERT) 25 MG tablet Take 1 tablet (25 mg total) by mouth 3 (three) times daily as needed for dizziness. 10 tablet 0   naltrexone (DEPADE) 50 MG tablet Take 1 tablet (50 mg total) by mouth daily. 30 tablet 1   naproxen (NAPROSYN) 500 MG tablet Take 1 tablet (500 mg total) by mouth 2 (two) times daily. 30 tablet 0   omeprazole (PRILOSEC) 20 MG capsule Take 1 capsule (20 mg total) by mouth daily. 30 capsule 0   ondansetron (ZOFRAN) 4 MG tablet Take 1 tablet (4 mg total) by mouth every 8 (eight) hours as needed for nausea or vomiting. 20 tablet 0   No current facility-administered medications on file prior to visit.    No family history on file.  Review of Systems: ROS negative except for what is noted on the assessment and plan.  Vitals:   02/10/22 1404 02/10/22 1451  BP: (!) 144/84 128/82  Pulse: 82 79  Temp: 98 F (36.7 C)   TempSrc: Oral   SpO2: 100%   Weight: 264 lb 6.4 oz (119.9 kg)   Height: 5\' 10"  (1.778 m)    Physical Exam: Constitutional:in no acute distress HENT: normocephalic atraumatic Eyes: conjunctiva non-erythematous Neck: supple Cardiovascular: regular rate and rhythm, no m/r/g Pulmonary/Chest: normal work of breathing on room air, lungs clear to auscultation bilaterally MSK: normal bulk and tone Neurological: alert  & oriented x 3 Skin: warm and dry Psych: normal mood  Assessment & Plan:   Atypical chest pain Assessment: Raymond Mcclure presents today for evaluation of atypical left sided chest pain that has been present for the past last 3 years. He has a significant cardiac history of SVT on metoprolol. He describes the pain as a constant, pounding chest pain that seems to only be exacerbated by stressful events. He describes the pain as his heart is pounding against his chest wall. He does endorse some associated shortness of breath when exacerbations occur. The pain is not reproducible to palpation. Denies history of acid reflux. On exam his chest wall is not tender to palpation, his heart rate is normal without murmurs, gallops, or rubs.He has no lower extremtiy swelling or signs of hypervolemia.  He has had an extensive cardiac work up by his cardiologist including a recent nuclear stress test, echocardiogram, and Holter monitor that were all without abnormalities per their documentation (unable to view images/results myself). Last cardiac catheterization was negative. He last saw his cardiologist on 02/08/22. With the description of his pain and negative cardiac work up, low suspicion that this is cardiac in etiology. With a negative holter monitor, low suspicion he is having frequent arhythmias.  With his significant psychiatric history and the pain worsening with stressful situations I believe this  is secondary to his stress. Low suspicion these are due to his pulmonary nodules. He is about to begin further counseling in [place] for his stress and he believes this will be helpful. We also discussed his recent decision to quit smoking and encouraged him to start exercising regularly, he notes joining the [facility] for water aerobics.   Plan: - continue to montior and work with behavioral health in [place] - exercise regularly, encourage healthier diet, and to continue to not smoke - continue metoprolol for Hx of  SVT   Pulmonary nodules Assessment: Recent CTA chest 01/2022 at [facility] with evidence of  a 3 mm right lower lobe pulmonary nodule and a 3 mm left upper lobe pulmonary nodule. With his significant smoking history will need repeat CT scan in 12 months. Discussed with the patient to either follow with [place] or if he decides to get a new PCP closer tow ear he lives in [place]. Denies night sweats or weight loss  Plan: - follow up CT scan in 1 year or sooner if systemic symptoms concerning for malignancy occur  Patient discussed with Dr. Laurena Slimmer, D.O. [facility], PGY-3 Phone: [phone] Date 02/12/2022 Time 7:18 AM

## 2022-02-10 NOTE — Patient Instructions (Signed)
Thank you, [name] for allowing [place] to provide your care today. Today we discussed.  Chest pain I believe there are multiple factors contributing: We need to continue to work on diet/exercise best you can. Decrease/stop your smoking.   Pulmonary nodules Please follow up in one year for a repeat CT scan of your lungs.   Elevated blood pressure Please continue taking your metoprolol and losartan    I have ordered the following labs for you:  Lab Orders  No laboratory test(s) ordered today    Referrals ordered today:   Referral Orders  No referral(s) requested today     I have ordered the following medication/changed the following medications:   Stop the following medications: There are no discontinued medications.   Start the following medications: No orders of the defined types were placed in this encounter.    Follow up: 1 month follow up to assess your blood pressure or following up with a primary in  if you choose to establish closer to home   Should you have any questions or concerns please call the internal medicine clinic at [phone].    Sanjuana Letters, D.O. [place]

## 2022-02-12 DIAGNOSIS — R918 Other nonspecific abnormal finding of lung field: Secondary | ICD-10-CM | POA: Insufficient documentation

## 2022-02-12 NOTE — Assessment & Plan Note (Signed)
Assessment: Recent CTA chest 01/2022 at [facility] with evidence of  a 3 mm right lower lobe pulmonary nodule and a 3 mm left upper lobe pulmonary nodule. With his significant smoking history will need repeat CT scan in 12 months. Discussed with the patient to either follow with [place] or if he decides to get a new PCP closer tow ear he lives in [place]. Denies night sweats or weight loss  Plan: - follow up CT scan in 1 year or sooner if systemic symptoms concerning for malignancy occur

## 2022-02-12 NOTE — Assessment & Plan Note (Addendum)
Assessment: Raymond Mcclure presents today for evaluation of atypical left sided chest pain that has been present for the past last 3 years. He has a significant cardiac history of SVT on metoprolol. He describes the pain as a constant, pounding chest pain that seems to only be exacerbated by stressful events. He describes the pain as his heart is pounding against his chest wall. He does endorse some associated shortness of breath when exacerbations occur. The pain is not reproducible to palpation. Denies history of acid reflux. On exam his chest wall is not tender to palpation, his heart rate is normal without murmurs, gallops, or rubs.He has no lower extremtiy swelling or signs of hypervolemia.  He has had an extensive cardiac work up by his cardiologist including a recent nuclear stress test, echocardiogram, and Holter monitor that were all without abnormalities per their documentation (unable to view images/results myself). Last cardiac catheterization was negative. He last saw his cardiologist on 02/08/22. With the description of his pain and negative cardiac work up, low suspicion that this is cardiac in etiology. With a negative holter monitor, low suspicion he is having frequent arhythmias.  With his significant psychiatric history and the pain worsening with stressful situations I believe this is secondary to his stress. Low suspicion these are due to his pulmonary nodules. He is about to begin further counseling in [place] for his stress and he believes this will be helpful. We also discussed his recent decision to quit smoking and encouraged him to start exercising regularly, he notes joining the [facility] for water aerobics.   Plan: - continue to montior and work with behavioral health in [place] - exercise regularly, encourage healthier diet, and to continue to not smoke - continue metoprolol for Hx of SVT

## 2022-02-12 NOTE — Progress Notes (Signed)
Internal Medicine Clinic Attending  Case discussed with Dr. Katsadouros  At the time of the visit.  We reviewed the resident's history and exam and pertinent patient test results.  I agree with the assessment, diagnosis, and plan of care documented in the resident's note.  

## 2022-02-17 DIAGNOSIS — G4733 Obstructive sleep apnea (adult) (pediatric): Secondary | ICD-10-CM | POA: Diagnosis not present

## 2022-03-09 DIAGNOSIS — K2900 Acute gastritis without bleeding: Secondary | ICD-10-CM | POA: Diagnosis not present

## 2022-03-20 DIAGNOSIS — G4733 Obstructive sleep apnea (adult) (pediatric): Secondary | ICD-10-CM | POA: Diagnosis not present

## 2022-03-22 DIAGNOSIS — W19XXXA Unspecified fall, initial encounter: Secondary | ICD-10-CM | POA: Diagnosis not present

## 2022-03-22 DIAGNOSIS — I10 Essential (primary) hypertension: Secondary | ICD-10-CM | POA: Diagnosis not present

## 2022-03-22 DIAGNOSIS — E785 Hyperlipidemia, unspecified: Secondary | ICD-10-CM | POA: Diagnosis not present

## 2022-03-22 DIAGNOSIS — Y9301 Activity, walking, marching and hiking: Secondary | ICD-10-CM | POA: Diagnosis not present

## 2022-03-22 DIAGNOSIS — M25511 Pain in right shoulder: Secondary | ICD-10-CM | POA: Diagnosis not present

## 2022-03-22 DIAGNOSIS — S0990XA Unspecified injury of head, initial encounter: Secondary | ICD-10-CM | POA: Diagnosis not present

## 2022-03-22 DIAGNOSIS — R69 Illness, unspecified: Secondary | ICD-10-CM | POA: Diagnosis not present

## 2022-03-22 DIAGNOSIS — Z79899 Other long term (current) drug therapy: Secondary | ICD-10-CM | POA: Diagnosis not present

## 2022-03-22 DIAGNOSIS — R519 Headache, unspecified: Secondary | ICD-10-CM | POA: Diagnosis not present

## 2022-03-22 DIAGNOSIS — Z8679 Personal history of other diseases of the circulatory system: Secondary | ICD-10-CM | POA: Diagnosis not present

## 2022-03-22 DIAGNOSIS — R55 Syncope and collapse: Secondary | ICD-10-CM | POA: Diagnosis not present

## 2022-04-18 DIAGNOSIS — G4733 Obstructive sleep apnea (adult) (pediatric): Secondary | ICD-10-CM | POA: Diagnosis not present

## 2022-05-19 DIAGNOSIS — G4733 Obstructive sleep apnea (adult) (pediatric): Secondary | ICD-10-CM | POA: Diagnosis not present

## 2022-06-18 DIAGNOSIS — G4733 Obstructive sleep apnea (adult) (pediatric): Secondary | ICD-10-CM | POA: Diagnosis not present

## 2022-07-19 DIAGNOSIS — G4733 Obstructive sleep apnea (adult) (pediatric): Secondary | ICD-10-CM | POA: Diagnosis not present

## 2022-08-06 DIAGNOSIS — R079 Chest pain, unspecified: Secondary | ICD-10-CM | POA: Diagnosis not present

## 2022-08-18 DIAGNOSIS — G4733 Obstructive sleep apnea (adult) (pediatric): Secondary | ICD-10-CM | POA: Diagnosis not present

## 2022-09-30 ENCOUNTER — Encounter: Payer: Medicare PPO | Admitting: Student

## 2022-09-30 NOTE — Progress Notes (Deleted)
CC: ***  HPI:  [name] is a 57 y.o. male with a past medical history of hypertension, CAD, pulmonary nodules, dyslipidemia presenting for follow-up visit.  Nausea: Zofran every 8 hours as needed GERD: Omeprazole 20 mg daily Substance use: Naltrexone 50 mg daily Dizziness: Meclizine 25 mg 3 times daily as needed Bipolar disorder: Abilify 10 mg daily, Wellbutrin 300 mg daily  Meds Per outside chart shows albuterol, Abilify 10 mg daily, bupropion 300 mg daily, Flonase, Imdur 30 mg daily, losartan 50 mg daily, melatonin 5 mg daily, metoprolol succinate 30 mg daily, naltrexone 50 mg daily, Zofran 4 mg every 8 hours as needed, omeprazole 40 mg daily  Most recently seen in the office back in January 2024, 6 months ago.  At that time patient had atypical chest pain.  Not thought to be cardiac related.  Thought to be related to stressful situations.  Patient encouraged for healthy diet, exercise regularly and continue metoprolol.  I do not see metoprolol on his med list.  Plan for follow-up CT in 6 months for pulmonary nodule.  Recently seen in the cardiology office in March 2024 for a syncopal episode.  Thought to be related to vasovagal etiology.  Patient had reassuring cardiac workups.  Recently seen in the emergency department at [facility] for concerns of chest pain.  Patient had reassuring CT abdomen.  Was discharged with omeprazole patient had normal EKG.  Ask about EGD and colonoscopy.  Labs: 07/29/724 500 mg GFR greater than 60 APTT 33 PT/INR 12.2-1.06 troponin 525 lipase 22, CMP sodium 137, chloride 108, bicarb 23, creatinine 0.91 CBC showing normal hemoglobin at 14 potassium 4.4 AST 16  Recent lipid panel 16 years ago.  Will need lipid panel today.  Colonoscopy MiraLAX and the patient A1c   Past Medical History:  Diagnosis Date   Cluster headaches    Drug abuse (HCC)    Migraines    Obesity    Tachycardia      Current Outpatient Medications:     ARIPiprazole (ABILIFY) 10 MG tablet, TAKE 1 TABLET BY MOUTH DAILY, Disp: 30 tablet, Rfl: 10   buPROPion (WELLBUTRIN XL) 300 MG 24 hr tablet, Take 1 tablet (300 mg total) by mouth daily., Disp: 30 tablet, Rfl: 0   meclizine (ANTIVERT) 25 MG tablet, Take 1 tablet (25 mg total) by mouth 3 (three) times daily as needed for dizziness., Disp: 10 tablet, Rfl: 0   naltrexone (DEPADE) 50 MG tablet, Take 1 tablet (50 mg total) by mouth daily., Disp: 30 tablet, Rfl: 1   naproxen (NAPROSYN) 500 MG tablet, Take 1 tablet (500 mg total) by mouth 2 (two) times daily., Disp: 30 tablet, Rfl: 0   omeprazole (PRILOSEC) 20 MG capsule, Take 1 capsule (20 mg total) by mouth daily., Disp: 30 capsule, Rfl: 0   ondansetron (ZOFRAN) 4 MG tablet, Take 1 tablet (4 mg total) by mouth every 8 (eight) hours as needed for nausea or vomiting., Disp: 20 tablet, Rfl: 0  Review of Systems:  ***  Constitutional: Eye: Respiratory: Cardiovascular: GI: MSK: GU: Skin: Neuro: Endocrine:   Physical Exam:  There were no vitals filed for this visit. *** General: Patient is sitting comfortably in the room  Eyes: Pupils equal and reactive to light, EOM intact  Head: Normocephalic, atraumatic  Neck: Supple, nontender, full range of motion, No JVD Cardio: Regular rate and rhythm, no murmurs, rubs or gallops. 2+ pulses to bilateral upper and lower extremities  Chest: No chest tenderness Pulmonary: Clear  to ausculation bilaterally with no rales, rhonchi, and crackles  Abdomen: Soft, nontender with normoactive bowel sounds with no rebound or guarding  Neuro: Alert and orientated x3. CN II-XII intact. Sensation intact to upper and lower extremities. 2+ patellar reflex.  Back: No midline tenderness, no step off or deformities noted. No paraspinal muscle tenderness.  Skin: No rashes noted  MSK: 5/5 strength to upper and lower extremities.    Assessment & Plan:   No problem-specific Assessment & Plan notes found for this  encounter.    Patient {GC/GE:3044014::"discussed with","seen with"} Dr. {NAMES:[id number]::"Guilloud","Hoffman","Mullen","Narendra","Williams","Vincent"}  Modena Slater, DO PGY-2 Internal Medicine Resident  Pager: [phone]

## 2023-02-11 ENCOUNTER — Emergency Department (HOSPITAL_COMMUNITY): Payer: Medicare PPO

## 2023-02-11 ENCOUNTER — Encounter (HOSPITAL_COMMUNITY): Payer: Self-pay

## 2023-02-11 ENCOUNTER — Emergency Department (HOSPITAL_COMMUNITY)
Admission: EM | Admit: 2023-02-11 | Discharge: 2023-02-11 | Disposition: A | Discharge status: Home / Self Care | Payer: Medicare PPO | Attending: Emergency Medicine | Admitting: Emergency Medicine

## 2023-02-11 ENCOUNTER — Other Ambulatory Visit: Payer: Self-pay

## 2023-02-11 DIAGNOSIS — J0100 Acute maxillary sinusitis, unspecified: Secondary | ICD-10-CM | POA: Diagnosis not present

## 2023-02-11 DIAGNOSIS — S0083XA Contusion of other part of head, initial encounter: Secondary | ICD-10-CM | POA: Diagnosis not present

## 2023-02-11 DIAGNOSIS — W19XXXA Unspecified fall, initial encounter: Secondary | ICD-10-CM

## 2023-02-11 DIAGNOSIS — Y92009 Unspecified place in unspecified non-institutional (private) residence as the place of occurrence of the external cause: Secondary | ICD-10-CM | POA: Insufficient documentation

## 2023-02-11 DIAGNOSIS — Z91010 Allergy to peanuts: Secondary | ICD-10-CM | POA: Diagnosis not present

## 2023-02-11 DIAGNOSIS — S0990XA Unspecified injury of head, initial encounter: Secondary | ICD-10-CM | POA: Diagnosis present

## 2023-02-11 DIAGNOSIS — W1830XA Fall on same level, unspecified, initial encounter: Secondary | ICD-10-CM | POA: Insufficient documentation

## 2023-02-11 DIAGNOSIS — S0093XA Contusion of unspecified part of head, initial encounter: Secondary | ICD-10-CM

## 2023-02-11 LAB — CBC WITH DIFFERENTIAL/PLATELET
Abs Immature Granulocytes: 0.09 10*3/uL — ABNORMAL HIGH (ref 0.00–0.07)
Basophils Absolute: 0.1 10*3/uL (ref 0.0–0.1)
Basophils Relative: 0 %
Eosinophils Absolute: 0.1 10*3/uL (ref 0.0–0.5)
Eosinophils Relative: 1 %
HCT: 39.2 % (ref 39.0–52.0)
Hemoglobin: 13.5 g/dL (ref 13.0–17.0)
Immature Granulocytes: 1 %
Lymphocytes Relative: 18 %
Lymphs Abs: 2.8 10*3/uL (ref 0.7–4.0)
MCH: 32 pg (ref 26.0–34.0)
MCHC: 34.4 g/dL (ref 30.0–36.0)
MCV: 92.9 fL (ref 80.0–100.0)
Monocytes Absolute: 1.1 10*3/uL — ABNORMAL HIGH (ref 0.1–1.0)
Monocytes Relative: 7 %
Neutro Abs: 11.7 10*3/uL — ABNORMAL HIGH (ref 1.7–7.7)
Neutrophils Relative %: 73 %
Platelets: 391 10*3/uL (ref 150–400)
RBC: 4.22 MIL/uL (ref 4.22–5.81)
RDW: 11.9 % (ref 11.5–15.5)
WBC: 15.8 10*3/uL — ABNORMAL HIGH (ref 4.0–10.5)
nRBC: 0 % (ref 0.0–0.2)

## 2023-02-11 LAB — COMPREHENSIVE METABOLIC PANEL
ALT: 21 U/L (ref 0–44)
AST: 18 U/L (ref 15–41)
Albumin: 3.3 g/dL — ABNORMAL LOW (ref 3.5–5.0)
Alkaline Phosphatase: 76 U/L (ref 38–126)
Anion gap: 11 (ref 5–15)
BUN: 6 mg/dL (ref 6–20)
CO2: 21 mmol/L — ABNORMAL LOW (ref 22–32)
Calcium: 9.1 mg/dL (ref 8.9–10.3)
Chloride: 106 mmol/L (ref 98–111)
Creatinine, Ser: 1.1 mg/dL (ref 0.61–1.24)
GFR, Estimated: >60 mL/min (ref >60)
Glucose, Bld: 100 mg/dL — ABNORMAL HIGH (ref 70–99)
Potassium: 4 mmol/L (ref 3.5–5.1)
Sodium: 138 mmol/L (ref 135–145)
Total Bilirubin: 0.7 mg/dL (ref 0.0–1.2)
Total Protein: 6.7 g/dL (ref 6.5–8.1)

## 2023-02-11 MED ORDER — HYDROCODONE-ACETAMINOPHEN 5-325 MG PO TABS
1.0000 | ORAL_TABLET | Freq: Once | ORAL | Status: AC
Start: 2023-02-11 — End: 2023-02-11
  Administered 2023-02-11: 1 via ORAL
  Filled 2023-02-11: qty 1

## 2023-02-11 MED ORDER — AMOXICILLIN-POT CLAVULANATE 875-125 MG PO TABS
1.0000 | ORAL_TABLET | Freq: Two times a day (BID) | ORAL | 0 refills | Status: DC
  Filled 2023-02-11: qty 20, 10d supply, fill #0

## 2023-02-11 NOTE — ED Provider Notes (Signed)
  EMERGENCY DEPARTMENT AT [facility] Provider Note   CSN: [id number] Arrival date & time: 02/11/23  9383     History  No chief complaint on file.   Raymond Mcclure is a 58 y.o. male.  Pt complains of fallings this am.  Pt report he got up to go to the bathroom and fell hitting his head.  Pt has a history of vertigo and syncope.  Pt reports he hit his head.  Pt complains of a headache and soreness in his chest.  Pt reports some soreness in his neck.  Pt denies fever chills or cough.  No chest pain,  no headache before fall   The history is provided by the patient. No language interpreter was used.       Home Medications Prior to Admission medications   Medication Sig Start Date End Date Taking? Authorizing Provider  ARIPiprazole  (ABILIFY ) 10 MG tablet TAKE 1 TABLET BY MOUTH DAILY 11/16/21   Jinwala, Sagar H, MD  buPROPion  (WELLBUTRIN  XL) 300 MG 24 hr tablet Take 1 tablet (300 mg total) by mouth daily. 08/22/21 09/21/21  Moishe Bernadette BRAVO, NP  meclizine  (ANTIVERT ) 25 MG tablet Take 1 tablet (25 mg total) by mouth 3 (three) times daily as needed for dizziness. 07/29/21   Elnor Jayson LABOR, DO  naltrexone  (DEPADE) 50 MG tablet Take 1 tablet (50 mg total) by mouth daily. 06/12/21   Nwoko, Uchenna E, PA  naproxen  (NAPROSYN ) 500 MG tablet Take 1 tablet (500 mg total) by mouth 2 (two) times daily. 08/13/20   Shepard, Margaux, PA-C  omeprazole  (PRILOSEC) 20 MG capsule Take 1 capsule (20 mg total) by mouth daily. 08/17/21 09/16/21  Lynwood Lenis, PA-C  ondansetron  (ZOFRAN ) 4 MG tablet Take 1 tablet (4 mg total) by mouth every 8 (eight) hours as needed for nausea or vomiting. 08/17/21   Lynwood Lenis, PA-C      Allergies    Bee venom, Peanut oil, and Peanut-containing drug products    Review of Systems   Review of Systems  All other systems reviewed and are negative.   Physical Exam Updated Vital Signs BP (!) 140/84   Pulse 98   Temp 98.5 F (36.9 C) (Oral)   Resp 18   Ht 5'  10 (1.778 m)   Wt 117.9 kg   SpO2 99%   BMI 37.31 kg/m  Physical Exam Vitals and nursing note reviewed.  Constitutional:      Appearance: Normal appearance. He is well-developed.  HENT:     Head: Normocephalic and atraumatic.     Right Ear: Tympanic membrane normal.     Left Ear: Tympanic membrane normal.     Nose: Nose normal.     Mouth/Throat:     Mouth: Mucous membranes are moist.  Cardiovascular:     Rate and Rhythm: Normal rate.  Pulmonary:     Effort: Pulmonary effort is normal.  Abdominal:     General: There is no distension.  Musculoskeletal:        General: Normal range of motion.     Cervical back: Normal range of motion.  Skin:    General: Skin is warm.  Neurological:     General: No focal deficit present.     Mental Status: He is alert and oriented to person, place, and time.     ED Results / Procedures / Treatments   Labs (all labs ordered are listed, but only abnormal results are displayed) Labs Reviewed  CBC WITH DIFFERENTIAL/PLATELET -  Abnormal; Notable for the following components:      Result Value   WBC 15.8 (*)    Neutro Abs 11.7 (*)    Monocytes Absolute 1.1 (*)    Abs Immature Granulocytes 0.09 (*)    All other components within normal limits  COMPREHENSIVE METABOLIC PANEL - Abnormal; Notable for the following components:   CO2 21 (*)    Glucose, Bld 100 (*)    Albumin 3.3 (*)    All other components within normal limits    EKG EKG Interpretation Date/Time:  Friday February 11 2023 06:24:03 EST Ventricular Rate:  83 PR Interval:  177 QRS Duration:  56 QT Interval:  443 QTC Calculation: 521 R Axis:   61  Text Interpretation: Sinus rhythm Borderline T abnormalities, anterior leads Prolonged QT interval No significant change since last tracing Confirmed by Haze Lonni PARAS ([id number]) on 02/11/2023 6:29:56 AM  Radiology CT Head Wo Contrast Result Date: 02/11/2023 CLINICAL DATA:  Syncopal episode.  Fell hitting his head. EXAM: CT  HEAD WITHOUT CONTRAST CT CERVICAL SPINE WITHOUT CONTRAST TECHNIQUE: Multidetector CT imaging of the head and cervical spine was performed following the standard protocol without intravenous contrast. Multiplanar CT image reconstructions of the cervical spine were also generated. RADIATION DOSE REDUCTION: This exam was performed according to the departmental dose-optimization program which includes automated exposure control, adjustment of the mA and/or kV according to patient size and/or use of iterative reconstruction technique. COMPARISON:  05/01/2013. FINDINGS: CT HEAD FINDINGS Brain: No evidence of acute infarction, hemorrhage, hydrocephalus, extra-axial collection or mass lesion/mass effect. Vascular: No hyperdense vessel or unexpected calcification. Skull: Normal. Negative for fracture or focal lesion. Sinuses/Orbits: Globes and orbits are unremarkable. Significant sinus disease. Left maxillary sinus is mostly opacified. There is significant opacification the ethmoid air cells, left greater than right. Sphenoid right maxillary sinus mucosal thickening, mild. Dependent fluid noted in the frontal, left sphenoid and right maxillary sinuses. Other: None. CT CERVICAL SPINE FINDINGS Alignment: Normal. Skull base and vertebrae: No acute fracture. No primary bone lesion or focal pathologic process. Soft tissues and spinal canal: No prevertebral fluid or swelling. No visible canal hematoma. Disc levels: Mild loss of disc height at C5-C6 and C6-C7. Remaining disc spaces relatively well preserved. No evidence of a disc herniation. Mild neural foraminal narrowing on the right C5-C6 due to uncovertebral spurring. Upper chest: No acute or significant abnormality. Other: None. IMPRESSION: HEAD CT 1. No acute intracranial abnormalities. 2. Significant sinus disease consistent with acute sinusitis in proper setting. CERVICAL CT 1. No fracture or acute finding. Electronically Signed   By: Alm Parkins M.D.   On: 02/11/2023  07:57   CT Cervical Spine Wo Contrast Result Date: 02/11/2023 CLINICAL DATA:  Syncopal episode.  Fell hitting his head. EXAM: CT HEAD WITHOUT CONTRAST CT CERVICAL SPINE WITHOUT CONTRAST TECHNIQUE: Multidetector CT imaging of the head and cervical spine was performed following the standard protocol without intravenous contrast. Multiplanar CT image reconstructions of the cervical spine were also generated. RADIATION DOSE REDUCTION: This exam was performed according to the departmental dose-optimization program which includes automated exposure control, adjustment of the mA and/or kV according to patient size and/or use of iterative reconstruction technique. COMPARISON:  05/01/2013. FINDINGS: CT HEAD FINDINGS Brain: No evidence of acute infarction, hemorrhage, hydrocephalus, extra-axial collection or mass lesion/mass effect. Vascular: No hyperdense vessel or unexpected calcification. Skull: Normal. Negative for fracture or focal lesion. Sinuses/Orbits: Globes and orbits are unremarkable. Significant sinus disease. Left maxillary sinus is mostly opacified. There is  significant opacification the ethmoid air cells, left greater than right. Sphenoid right maxillary sinus mucosal thickening, mild. Dependent fluid noted in the frontal, left sphenoid and right maxillary sinuses. Other: None. CT CERVICAL SPINE FINDINGS Alignment: Normal. Skull base and vertebrae: No acute fracture. No primary bone lesion or focal pathologic process. Soft tissues and spinal canal: No prevertebral fluid or swelling. No visible canal hematoma. Disc levels: Mild loss of disc height at C5-C6 and C6-C7. Remaining disc spaces relatively well preserved. No evidence of a disc herniation. Mild neural foraminal narrowing on the right C5-C6 due to uncovertebral spurring. Upper chest: No acute or significant abnormality. Other: None. IMPRESSION: HEAD CT 1. No acute intracranial abnormalities. 2. Significant sinus disease consistent with acute sinusitis  in proper setting. CERVICAL CT 1. No fracture or acute finding. Electronically Signed   By: Alm Parkins M.D.   On: 02/11/2023 07:57   DG Chest 2 View Result Date: 02/11/2023 CLINICAL DATA:  Syncope.  Pain. EXAM: CHEST - 2 VIEW COMPARISON:  07/29/2021 FINDINGS: Low volume film. The lungs are clear without focal pneumonia, edema, pneumothorax or pleural effusion. Cardiopericardial silhouette is at upper limits of normal for size. No acute bony abnormality. Telemetry leads overlie the chest. IMPRESSION: Low volume film without acute cardiopulmonary findings. Electronically Signed   By: Camellia Candle M.D.   On: 02/11/2023 07:21    Procedures Procedures    Medications Ordered in ED Medications  HYDROcodone -acetaminophen  (NORCO/VICODIN) 5-325 MG per tablet 1 tablet (1 tablet Oral Given 02/11/23 9166)    ED Course/ Medical Decision Making/ A&P                                 Medical Decision Making Reports following when he got up this morning to go to the bathroom.  Patient has a past medical history of syncope and vertigo.  Patient states he did strike his head he complains of a pain in his head and soreness in his chest.  Amount and/or Complexity of Data Reviewed External Data Reviewed: notes.    Details: Review his notes reviewed. Labs: ordered. Decision-making details documented in ED Course.    Details: Labs ordered reviewed and interpreted.  Patient has an elevated white blood cell count of 15 Radiology: ordered and independent interpretation performed. Decision-making details documented in ED Course.    Details: CT head and cervical spine obtained CT head shows left maxillary sinus disease/infection Asked x-ray no acute changes ECG/medicine tests: ordered and independent interpretation performed. Decision-making details documented in ED Course.    Details: EGD no acute normal sinus  Risk Prescription drug management. Risk Details: Counseled on findings.  Patient prescribed  Augmentin .  He is advised to follow-up with his primary care physician for recheck           Final Clinical Impression(s) / ED Diagnoses Final diagnoses:  Acute non-recurrent maxillary sinusitis  Fall, initial encounter  Contusion of head, unspecified part of head, initial encounter    Rx / DC Orders ED Discharge Orders          Ordered    amoxicillin -clavulanate (AUGMENTIN ) 875-125 MG tablet  2 times daily        02/11/23 0856          An After Visit Summary was printed and given to the patient.     Flint Sonny POUR, PA-C 02/11/23 9142    Neysa Caron PARAS, DO 02/11/23 1545

## 2023-02-11 NOTE — ED Triage Notes (Signed)
 Arrives [facility] from home after a fall at home.   Says he was getting up from his room to go to restroom and had a syncopal episode hitting his head. [name] was able to notify 911 for assistance.   Denies anticoagulants. Says he is supposed to take Aspirin  but needs a refill.   Neck tenderness favoring left side.

## 2023-02-11 NOTE — Discharge Instructions (Addendum)
Tylenol for headache

## 2023-02-14 ENCOUNTER — Other Ambulatory Visit: Payer: Self-pay

## 2023-02-14 ENCOUNTER — Encounter (INDEPENDENT_AMBULATORY_CARE_PROVIDER_SITE_OTHER): Payer: Self-pay | Admitting: Otolaryngology

## 2023-02-16 ENCOUNTER — Encounter (HOSPITAL_COMMUNITY): Payer: Self-pay

## 2023-02-16 ENCOUNTER — Emergency Department (HOSPITAL_COMMUNITY): Payer: Medicare PPO

## 2023-02-16 ENCOUNTER — Emergency Department (HOSPITAL_COMMUNITY)
Admission: EM | Admit: 2023-02-16 | Discharge: 2023-02-16 | Disposition: A | Discharge status: Home / Self Care | Payer: Medicare PPO | Attending: Emergency Medicine | Admitting: Emergency Medicine

## 2023-02-16 ENCOUNTER — Other Ambulatory Visit: Payer: Self-pay

## 2023-02-16 DIAGNOSIS — I10 Essential (primary) hypertension: Secondary | ICD-10-CM | POA: Diagnosis not present

## 2023-02-16 DIAGNOSIS — R42 Dizziness and giddiness: Secondary | ICD-10-CM | POA: Diagnosis present

## 2023-02-16 DIAGNOSIS — R519 Headache, unspecified: Secondary | ICD-10-CM | POA: Diagnosis present

## 2023-02-16 DIAGNOSIS — D72829 Elevated white blood cell count, unspecified: Secondary | ICD-10-CM | POA: Diagnosis not present

## 2023-02-16 DIAGNOSIS — Z79899 Other long term (current) drug therapy: Secondary | ICD-10-CM | POA: Diagnosis not present

## 2023-02-16 DIAGNOSIS — G43109 Migraine with aura, not intractable, without status migrainosus: Secondary | ICD-10-CM

## 2023-02-16 DIAGNOSIS — Z91010 Allergy to peanuts: Secondary | ICD-10-CM | POA: Insufficient documentation

## 2023-02-16 DIAGNOSIS — H539 Unspecified visual disturbance: Secondary | ICD-10-CM | POA: Diagnosis present

## 2023-02-16 LAB — COMPREHENSIVE METABOLIC PANEL
ALT: 21 U/L (ref 0–44)
AST: 24 U/L (ref 15–41)
Albumin: 3.4 g/dL — ABNORMAL LOW (ref 3.5–5.0)
Alkaline Phosphatase: 66 U/L (ref 38–126)
Anion gap: 11 (ref 5–15)
BUN: 12 mg/dL (ref 6–20)
CO2: 24 mmol/L (ref 22–32)
Calcium: 9.1 mg/dL (ref 8.9–10.3)
Chloride: 104 mmol/L (ref 98–111)
Creatinine, Ser: 1.06 mg/dL (ref 0.61–1.24)
GFR, Estimated: >60 mL/min (ref >60)
Glucose, Bld: 84 mg/dL (ref 70–99)
Potassium: 3.8 mmol/L (ref 3.5–5.1)
Sodium: 139 mmol/L (ref 135–145)
Total Bilirubin: 0.5 mg/dL (ref 0.0–1.2)
Total Protein: 6.8 g/dL (ref 6.5–8.1)

## 2023-02-16 LAB — CBC
HCT: 42 % (ref 39.0–52.0)
Hemoglobin: 14.1 g/dL (ref 13.0–17.0)
MCH: 31.8 pg (ref 26.0–34.0)
MCHC: 33.6 g/dL (ref 30.0–36.0)
MCV: 94.6 fL (ref 80.0–100.0)
Platelets: 432 10*3/uL — ABNORMAL HIGH (ref 150–400)
RBC: 4.44 MIL/uL (ref 4.22–5.81)
RDW: 12.1 % (ref 11.5–15.5)
WBC: 11.6 10*3/uL — ABNORMAL HIGH (ref 4.0–10.5)
nRBC: 0 % (ref 0.0–0.2)

## 2023-02-16 LAB — DIFFERENTIAL
Abs Immature Granulocytes: 0.04 10*3/uL (ref 0.00–0.07)
Basophils Absolute: 0.1 10*3/uL (ref 0.0–0.1)
Basophils Relative: 0 %
Eosinophils Absolute: 0.2 10*3/uL (ref 0.0–0.5)
Eosinophils Relative: 2 %
Immature Granulocytes: 0 %
Lymphocytes Relative: 30 %
Lymphs Abs: 3.5 10*3/uL (ref 0.7–4.0)
Monocytes Absolute: 0.8 10*3/uL (ref 0.1–1.0)
Monocytes Relative: 7 %
Neutro Abs: 7 10*3/uL (ref 1.7–7.7)
Neutrophils Relative %: 61 %

## 2023-02-16 LAB — I-STAT CHEM 8, ED
BUN: 13 mg/dL (ref 6–20)
Calcium, Ion: 1.2 mmol/L (ref 1.15–1.40)
Chloride: 103 mmol/L (ref 98–111)
Creatinine, Ser: 1 mg/dL (ref 0.61–1.24)
Glucose, Bld: 82 mg/dL (ref 70–99)
HCT: 41 % (ref 39.0–52.0)
Hemoglobin: 13.9 g/dL (ref 13.0–17.0)
Potassium: 3.9 mmol/L (ref 3.5–5.1)
Sodium: 140 mmol/L (ref 135–145)
TCO2: 26 mmol/L (ref 22–32)

## 2023-02-16 LAB — PROTIME-INR
INR: 1.1 (ref 0.8–1.2)
Prothrombin Time: 14 s (ref 11.4–15.2)

## 2023-02-16 LAB — APTT: aPTT: 33 s (ref 24–36)

## 2023-02-16 LAB — CBG MONITORING, ED: Glucose-Capillary: 112 mg/dL — ABNORMAL HIGH (ref 70–99)

## 2023-02-16 LAB — ETHANOL: Alcohol, Ethyl (B): <10 mg/dL (ref <10)

## 2023-02-16 MED ORDER — LACTATED RINGERS IV BOLUS
1000.0000 mL | Freq: Once | INTRAVENOUS | Status: AC
  Administered 2023-02-16: 1000 mL via INTRAVENOUS

## 2023-02-16 MED ORDER — IOHEXOL 350 MG/ML SOLN
75.0000 mL | Freq: Once | INTRAVENOUS | Status: AC | PRN
  Administered 2023-02-16: 75 mL via INTRAVENOUS

## 2023-02-16 MED ORDER — METOCLOPRAMIDE HCL 5 MG/ML IJ SOLN
10.0000 mg | Freq: Once | INTRAMUSCULAR | Status: AC
  Administered 2023-02-16: 10 mg via INTRAVENOUS
  Filled 2023-02-16: qty 2

## 2023-02-16 MED ORDER — ACETAMINOPHEN 500 MG PO TABS
1000.0000 mg | ORAL_TABLET | Freq: Once | ORAL | Status: AC
  Administered 2023-02-16: 1000 mg via ORAL
  Filled 2023-02-16: qty 2

## 2023-02-16 MED ORDER — SODIUM CHLORIDE 0.9% FLUSH: 3.0000 mL | Freq: Once | INTRAVENOUS | Status: DC

## 2023-02-16 NOTE — ED Notes (Signed)
Dr. Lynelle Doctor at bedside evaluating patient.

## 2023-02-16 NOTE — ED Notes (Signed)
 No code stroke activation per Dr. Monnie Anthony

## 2023-02-16 NOTE — ED Provider Notes (Signed)
  Physical Exam  BP 133/82   Pulse 61   Temp (!) 97.3 F (36.3 C) (Temporal)   Resp 14   SpO2 100%   Physical Exam  Procedures  Procedures  ED Course / MDM   Clinical Course as of 02/16/23 2036  Wed Feb 16, 2023  1511 Headache and symptoms resolved.  MRI is pending. [JD]    Clinical Course User Index [JD] Coleman Daughters, MD   Medical Decision Making Amount and/or Complexity of Data Reviewed Radiology: ordered.  Risk OTC drugs. Prescription drug management.   Received in signout.  Blurred vision.  Headache.  MRI done and showed potential vertebral occlusion.  CTA done after discussion with Dr. Alecia Ames from neurology.  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  Discussed with Dr. Linnell Richardson from neurology, who will see patient.   I had further discussion with the patient.  Does have a history of migraines and has had vision changes with them in the past.  No deficits now.  Discussed again with neurology.  Will discharge home with outpatient neurology follow-up.  Appears stable for discharge home.       Raymond Arias, MD 02/16/23 [phone]

## 2023-02-16 NOTE — ED Triage Notes (Signed)
 Pt arrives via EMS from home. Pt reports headache, blurred vision to left eye and intermittent numbness to his left arm that started about 1 hour ago. Pt states the numbness is gone, but he does report he still has blurred vision and the headache. Pt is AxOx4. Pt also reports decreased sensation to left arm.

## 2023-02-16 NOTE — ED Provider Triage Note (Signed)
 Emergency Medicine Provider Triage Evaluation Note  Raymond Mcclure , a 58 y.o. male  was evaluated in triage.  Pt complains of patient complains of symptoms of headache blurry vision in his left eye some numbness to his left arm that started about an hour prior to his arrival.  Patient indicates the numbness in his arm is resolved but he still has some vision issues and headache.  Patient states he called his doctor and was told to come to the ED for further evaluation  Review of Systems  Positive: Headache Negative: Speech difficulties  Physical Exam  BP (!) 147/90 (BP Location: Left Arm)   Pulse 88   Temp 98.9 F (37.2 C) (Oral)   Resp 18   SpO2 97%  Gen:   Awake, no distress   Resp:  Normal effort  MSK:   Moves extremities without difficulty  Other:  Neuro: Extraocular movements intact, normal pupils, no facial droop, no pronator drift, able to keep both legs extended against gravity while sitting in the chair, sensation intact in all extremities  Medical Decision Making  Medically screening exam initiated at 11:45 AM.  Appropriate orders placed.  CHERYL PEFLEY was informed that the remainder of the evaluation will be completed by another provider, this initial triage assessment does not replace that evaluation, and the importance of remaining in the ED until their evaluation is complete.  Patient's presentation is concerning for the possible TIA as well as cerebral hemorrhage.  No focal deficits appreciated on exam at this time.  Will not activate code stroke but will proceed with workup.  If symptoms change may need to activate at that time.  Will need to monitor closely.   Trish Furl, MD 02/16/23 1149

## 2023-02-16 NOTE — Discharge Instructions (Signed)
 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  Follow-up with neurology for this and your likely complicated migraine headache.

## 2023-02-16 NOTE — ED Provider Notes (Signed)
 [place] [facility] Provider Note   CSN: [id number] Arrival date & time: 02/16/23  1136     History  Chief Complaint  Patient presents with   Stroke Symptoms    Raymond Mcclure is a 58 y.o. male.  HPI Patient reports headache since Friday.  Around 9 AM he developed left arm and left face numbness.  This is since resolved.  Around 1030 he developed bilateral blurry vision worse on the left eye.  Currently still some blurry vision but feels like it is improved.  Headache is improving as well.  Numbness to his arm is gone.  No weakness.  No speech difficulties.  Facial numbness is resolved.     Home Medications Prior to Admission medications   Medication Sig Start Date End Date Taking? Authorizing Provider  atorvastatin  (LIPITOR) 10 MG tablet  01/29/23  Yes [provider]  isosorbide  mononitrate (IMDUR ) 30 MG 24 hr tablet  01/29/23  Yes [provider]  losartan  (COZAAR ) 25 MG tablet  01/29/23  Yes [provider]  amoxicillin -clavulanate (AUGMENTIN ) 875-125 MG tablet Take 1 tablet by mouth 2 (two) times daily. 02/11/23   Sofia, Leslie K, PA-C  ARIPiprazole  (ABILIFY ) 10 MG tablet TAKE 1 TABLET BY MOUTH DAILY 11/16/21   Jinwala, Sagar H, MD  buPROPion  (WELLBUTRIN  XL) 300 MG 24 hr tablet Take 1 tablet (300 mg total) by mouth daily. 08/22/21 09/21/21  Doneen Fuelling, NP  meclizine  (ANTIVERT ) 25 MG tablet Take 1 tablet (25 mg total) by mouth 3 (three) times daily as needed for dizziness. 07/29/21   Teddi Favors, DO  naltrexone  (DEPADE) 50 MG tablet Take 1 tablet (50 mg total) by mouth daily. 06/12/21   Nwoko, Uchenna E, PA  naproxen  (NAPROSYN ) 500 MG tablet Take 1 tablet (500 mg total) by mouth 2 (two) times daily. 08/13/20   Venter, Margaux, PA-C  omeprazole  (PRILOSEC) 20 MG capsule Take 1 capsule (20 mg total) by mouth daily. 08/17/21 09/16/21  Gretel Leaven, PA-C  ondansetron  (ZOFRAN ) 4 MG tablet Take 1 tablet (4 mg total) by  mouth every 8 (eight) hours as needed for nausea or vomiting. 08/17/21   Gretel Leaven, PA-C      Allergies    Bee venom, Peanut oil, Peanut-containing drug products, and Covid-19 (mrna) vaccine    Review of Systems   Review of Systems Review of systems completed and notable as per HPI.  ROS otherwise negative.   Physical Exam Updated Vital Signs BP (!) 149/88 (BP Location: Left Arm)   Pulse 81   Temp 98.2 F (36.8 C) (Oral)   Resp 18   SpO2 100%  Physical Exam Vitals and nursing note reviewed.  Constitutional:      General: He is not in acute distress.    Appearance: He is well-developed.  HENT:     Head: Normocephalic and atraumatic.     Nose: Nose normal.     Mouth/Throat:     Mouth: Mucous membranes are moist.     Pharynx: Oropharynx is clear.  Eyes:     Extraocular Movements: Extraocular movements intact.     Conjunctiva/sclera: Conjunctivae normal.     Pupils: Pupils are equal, round, and reactive to light.  Cardiovascular:     Rate and Rhythm: Normal rate and regular rhythm.     Pulses: Normal pulses.     Heart sounds: Normal heart sounds. No murmur heard. Pulmonary:     Effort: Pulmonary effort is normal. No respiratory  distress.     Breath sounds: Normal breath sounds.  Abdominal:     Palpations: Abdomen is soft.     Tenderness: There is no abdominal tenderness. There is no guarding or rebound.  Musculoskeletal:        General: No swelling.     Cervical back: Normal range of motion and neck supple. No rigidity or tenderness.     Right lower leg: No edema.     Left lower leg: No edema.  Skin:    General: Skin is warm and dry.     Capillary Refill: Capillary refill takes less than 2 seconds.  Neurological:     General: No focal deficit present.     Mental Status: He is alert and oriented to person, place, and time. Mental status is at baseline.     Cranial Nerves: No cranial nerve deficit.     Sensory: No sensory deficit.     Motor: No weakness.      Coordination: Coordination normal.     Gait: Gait normal.     Comments: Awake and alert.  Oriented to person, place, time, situation.  Normal speech.  Cranial nerves are intact.  No facial asymmetry or numbness.  He has full strength and normal sensation throughout the bilateral upper and lower extremities.  No neglect.  Normal finger-to-nose bilaterally.  Visual fields are full, able to count fingers in all visual fields without difficulty.  Extraocular movements are intact.  Ambulates without difficulty.  Psychiatric:        Mood and Affect: Mood normal.      ED Results / Procedures / Treatments   Labs (all labs ordered are listed, but only abnormal results are displayed) Labs Reviewed  CBC - Abnormal; Notable for the following components:      Result Value   WBC 11.6 (*)    Platelets 432 (*)    All other components within normal limits  COMPREHENSIVE METABOLIC PANEL - Abnormal; Notable for the following components:   Albumin 3.4 (*)    All other components within normal limits  CBG MONITORING, ED - Abnormal; Notable for the following components:   Glucose-Capillary 112 (*)    All other components within normal limits  PROTIME-INR  APTT  DIFFERENTIAL  ETHANOL  I-STAT CHEM 8, ED    EKG EKG Interpretation Date/Time:  Wednesday February 16 2023 11:56:54 EST Ventricular Rate:  86 PR Interval:  178 QRS Duration:  76 QT Interval:  376 QTC Calculation: 449 R Axis:   40  Text Interpretation: Normal sinus rhythm Possible Anterior infarct , age undetermined Abnormal ECG When compared with ECG of 11-Feb-2023 06:24, PREVIOUS ECG IS PRESENT Confirmed by Rueben Cote [phone]) on 02/16/2023 1:23:12 PM  Radiology MR Brain Wo Contrast (neuro protocol) Result Date: 02/16/2023 CLINICAL DATA:  Neuro deficit, acute, stroke suspected EXAM: MRI HEAD WITHOUT CONTRAST TECHNIQUE: Multiplanar, multiecho pulse sequences of the brain and surrounding structures were obtained without intravenous  contrast. COMPARISON:  Same day CT Head. FINDINGS: Brain: No acute infarction, hemorrhage, hydrocephalus, extra-axial collection or mass lesion. Vascular: Attenuated left intradural vertebral artery flow void. Skull and upper cervical spine: Normal marrow signal. Sinuses/Orbits: Significant paranasal sinus mucosal thickening. No acute orbital findings. Other: No mastoid effusions. IMPRESSION: 1. No evidence of acute intracranial abnormality. 2. Attenuated left intradural vertebral artery which could be due to significant stenosis or artifact. Recommend follow-up CTA or MRA to further evaluate. 3. Significant paranasal sinus mucosal thickening. Electronically Signed   By: Stevenson Elbe  M.D.   On: 02/16/2023 15:44   CT HEAD WO CONTRAST Result Date: 02/16/2023 CLINICAL DATA:  Left-sided numbness, dizziness, and headache, stroke suspected EXAM: CT HEAD WITHOUT CONTRAST TECHNIQUE: Contiguous axial images were obtained from the base of the skull through the vertex without intravenous contrast. RADIATION DOSE REDUCTION: This exam was performed according to the departmental dose-optimization program which includes automated exposure control, adjustment of the mA and/or kV according to patient size and/or use of iterative reconstruction technique. COMPARISON:  02/11/2023 FINDINGS: Brain: No evidence of acute infarction, hemorrhage, mass, mass effect, or midline shift. No hydrocephalus or extra-axial fluid collection. Vascular: No hyperdense vessel. Skull: Negative for fracture or focal lesion. Sinuses/Orbits: Complete opacification of the imaged left maxillary sinus, with mucosal thickening in the left greater than right ethmoid air cells, left greater than right frontal sinus, and left greater than right sphenoid sinus. Air-fluid levels in the left frontal and left sphenoid sinus. No osseous thickening or erosion. No acute finding in the orbits. Other: The mastoid air cells are well aerated. IMPRESSION: 1. No acute  intracranial process. 2. Paranasal sinus disease, with air-fluid levels in the left frontal and left sphenoid sinuses, which can be seen in the setting of acute sinusitis. Electronically Signed   By: Zoila Hines M.D.   On: 02/16/2023 12:21    Procedures Procedures    Medications Ordered in ED Medications  sodium chloride  flush (NS) 0.9 % injection 3 mL (has no administration in time range)  metoCLOPramide  (REGLAN ) injection 10 mg (10 mg Intravenous Given 02/16/23 1337)  lactated ringers  bolus 1,000 mL (1,000 mLs Intravenous New Bag/Given 02/16/23 1337)  acetaminophen  (TYLENOL ) tablet 1,000 mg (1,000 mg Oral Given 02/16/23 1338)    ED Course/ Medical Decision Making/ A&P Clinical Course as of 02/16/23 1555  Wed Feb 16, 2023  1511 Headache and symptoms resolved.  MRI is pending. [JD]    Clinical Course User Index [JD] Coleman Daughters, MD                                 Medical Decision Making Amount and/or Complexity of Data Reviewed Radiology: ordered.  Risk OTC drugs. Prescription drug management.   Medical Decision Making:   Raymond Mcclure is a 58 y.o. male who presented to the ED today with headache, vision changes, numbness.  Vital signs reviewed normal for mild hypertension.  On exam patient is well-appearing.  His headaches been on for several days but the left arm and face numbness started around 9 AM and around 1030 he developed some blurry vision to the eyes.  His numbness is completely resolved he is completely normal sensation on exam.  He is no focal deficits at all, and his visual changes have nearly resolved and he has full visual fields.  I did speak with neurology, he is within TNK window but symptoms are essentially resolved and seem more consistent likely atypical migraine.  He has no actual deficits on exam.  No indication for code stroke activation.  CT head is unremarkable.  Recommend MRI, will treat his headache obtain lab workup as well.  No fever or signs of  CNS infection.   Patient placed on continuous vitals and telemetry monitoring while in ED which was reviewed periodically.  Reviewed and confirmed nursing documentation for past medical history, family history, social history.  Reassessment and Plan:   Reevaluation after Reglan  he has no symptoms at all.  No  headache or vision changes or weakness or numbness.  His lab work is normal for mild leukocytosis.  CT head number for possible sinusitis, MRI is pending.  And was given to oncoming provider with plan to follow-up MRI and reassess.   Patient's presentation is most consistent with acute complicated illness / injury requiring diagnostic workup.           Final Clinical Impression(s) / ED Diagnoses Final diagnoses:  Acute nonintractable headache, unspecified headache type    Rx / DC Orders ED Discharge Orders     None         Coleman Daughters, MD 02/16/23 1555

## 2023-03-03 ENCOUNTER — Encounter (HOSPITAL_COMMUNITY): Payer: Self-pay | Admitting: Psychiatry

## 2023-03-03 ENCOUNTER — Other Ambulatory Visit: Payer: Self-pay

## 2023-03-03 ENCOUNTER — Ambulatory Visit (HOSPITAL_COMMUNITY)
Admission: EM | Admit: 2023-03-03 | Discharge: 2023-03-03 | Disposition: A | Discharge status: Other Acute Inpatient Hospital | Payer: Medicare PPO | Attending: Behavioral Health | Admitting: Behavioral Health

## 2023-03-03 ENCOUNTER — Inpatient Hospital Stay (HOSPITAL_COMMUNITY)
Admission: AD | Admit: 2023-03-03 | Discharge: 2023-03-14 | DRG: 885 | Disposition: A | Discharge status: Home / Self Care | Payer: Medicare Other | Source: Intra-hospital | Attending: Psychiatry | Admitting: Psychiatry

## 2023-03-03 DIAGNOSIS — R45850 Homicidal ideations: Secondary | ICD-10-CM

## 2023-03-03 DIAGNOSIS — F1490 Cocaine use, unspecified, uncomplicated: Secondary | ICD-10-CM

## 2023-03-03 DIAGNOSIS — F1590 Other stimulant use, unspecified, uncomplicated: Secondary | ICD-10-CM | POA: Insufficient documentation

## 2023-03-03 DIAGNOSIS — F315 Bipolar disorder, current episode depressed, severe, with psychotic features: Secondary | ICD-10-CM

## 2023-03-03 DIAGNOSIS — F3160 Bipolar disorder, current episode mixed, unspecified: Secondary | ICD-10-CM | POA: Diagnosis not present

## 2023-03-03 DIAGNOSIS — F1020 Alcohol dependence, uncomplicated: Secondary | ICD-10-CM | POA: Insufficient documentation

## 2023-03-03 DIAGNOSIS — Z9151 Personal history of suicidal behavior: Secondary | ICD-10-CM

## 2023-03-03 DIAGNOSIS — F3130 Bipolar disorder, current episode depressed, mild or moderate severity, unspecified: Secondary | ICD-10-CM | POA: Diagnosis present

## 2023-03-03 DIAGNOSIS — F1510 Other stimulant abuse, uncomplicated: Secondary | ICD-10-CM | POA: Diagnosis present

## 2023-03-03 DIAGNOSIS — F0393 Unspecified dementia, unspecified severity, with mood disturbance: Secondary | ICD-10-CM | POA: Diagnosis not present

## 2023-03-03 DIAGNOSIS — F17290 Nicotine dependence, other tobacco product, uncomplicated: Secondary | ICD-10-CM | POA: Diagnosis present

## 2023-03-03 DIAGNOSIS — G43909 Migraine, unspecified, not intractable, without status migrainosus: Secondary | ICD-10-CM | POA: Diagnosis present

## 2023-03-03 DIAGNOSIS — F1410 Cocaine abuse, uncomplicated: Secondary | ICD-10-CM | POA: Insufficient documentation

## 2023-03-03 DIAGNOSIS — Z91199 Patient's noncompliance with other medical treatment and regimen due to unspecified reason: Secondary | ICD-10-CM

## 2023-03-03 DIAGNOSIS — Z7982 Long term (current) use of aspirin: Secondary | ICD-10-CM | POA: Diagnosis not present

## 2023-03-03 DIAGNOSIS — Z79899 Other long term (current) drug therapy: Secondary | ICD-10-CM

## 2023-03-03 DIAGNOSIS — R45851 Suicidal ideations: Secondary | ICD-10-CM | POA: Diagnosis present

## 2023-03-03 DIAGNOSIS — F0392 Unspecified dementia, unspecified severity, with psychotic disturbance: Secondary | ICD-10-CM | POA: Insufficient documentation

## 2023-03-03 DIAGNOSIS — F4310 Post-traumatic stress disorder, unspecified: Secondary | ICD-10-CM | POA: Diagnosis present

## 2023-03-03 DIAGNOSIS — U071 COVID-19: Secondary | ICD-10-CM | POA: Diagnosis not present

## 2023-03-03 DIAGNOSIS — I6522 Occlusion and stenosis of left carotid artery: Secondary | ICD-10-CM | POA: Diagnosis present

## 2023-03-03 DIAGNOSIS — F1090 Alcohol use, unspecified, uncomplicated: Secondary | ICD-10-CM | POA: Diagnosis not present

## 2023-03-03 DIAGNOSIS — F1290 Cannabis use, unspecified, uncomplicated: Secondary | ICD-10-CM | POA: Insufficient documentation

## 2023-03-03 DIAGNOSIS — Z91148 Patient's other noncompliance with medication regimen for other reason: Secondary | ICD-10-CM

## 2023-03-03 DIAGNOSIS — E669 Obesity, unspecified: Secondary | ICD-10-CM | POA: Diagnosis present

## 2023-03-03 DIAGNOSIS — Z818 Family history of other mental and behavioral disorders: Secondary | ICD-10-CM | POA: Diagnosis not present

## 2023-03-03 DIAGNOSIS — K219 Gastro-esophageal reflux disease without esophagitis: Secondary | ICD-10-CM | POA: Insufficient documentation

## 2023-03-03 DIAGNOSIS — G4733 Obstructive sleep apnea (adult) (pediatric): Secondary | ICD-10-CM | POA: Diagnosis present

## 2023-03-03 DIAGNOSIS — Z95818 Presence of other cardiac implants and grafts: Secondary | ICD-10-CM

## 2023-03-03 DIAGNOSIS — F1210 Cannabis abuse, uncomplicated: Secondary | ICD-10-CM | POA: Diagnosis present

## 2023-03-03 DIAGNOSIS — I4892 Unspecified atrial flutter: Secondary | ICD-10-CM | POA: Diagnosis present

## 2023-03-03 DIAGNOSIS — Z6836 Body mass index (BMI) 36.0-36.9, adult: Secondary | ICD-10-CM

## 2023-03-03 DIAGNOSIS — K5909 Other constipation: Secondary | ICD-10-CM | POA: Diagnosis present

## 2023-03-03 DIAGNOSIS — E785 Hyperlipidemia, unspecified: Secondary | ICD-10-CM | POA: Diagnosis present

## 2023-03-03 DIAGNOSIS — F251 Schizoaffective disorder, depressive type: Secondary | ICD-10-CM | POA: Diagnosis present

## 2023-03-03 DIAGNOSIS — I10 Essential (primary) hypertension: Secondary | ICD-10-CM | POA: Diagnosis present

## 2023-03-03 DIAGNOSIS — Z5941 Food insecurity: Secondary | ICD-10-CM

## 2023-03-03 DIAGNOSIS — F0394 Unspecified dementia, unspecified severity, with anxiety: Secondary | ICD-10-CM | POA: Insufficient documentation

## 2023-03-03 DIAGNOSIS — Z7951 Long term (current) use of inhaled steroids: Secondary | ICD-10-CM

## 2023-03-03 DIAGNOSIS — F411 Generalized anxiety disorder: Secondary | ICD-10-CM | POA: Diagnosis present

## 2023-03-03 DIAGNOSIS — Z5982 Transportation insecurity: Secondary | ICD-10-CM

## 2023-03-03 HISTORY — DX: Essential (primary) hypertension: I10

## 2023-03-03 LAB — CBC WITH DIFFERENTIAL/PLATELET
Abs Immature Granulocytes: 0.04 10*3/uL (ref 0.00–0.07)
Basophils Absolute: 0 10*3/uL (ref 0.0–0.1)
Basophils Relative: 0 %
Eosinophils Absolute: 0.1 10*3/uL (ref 0.0–0.5)
Eosinophils Relative: 1 %
HCT: 43.5 % (ref 39.0–52.0)
Hemoglobin: 14.8 g/dL (ref 13.0–17.0)
Immature Granulocytes: 0 %
Lymphocytes Relative: 24 %
Lymphs Abs: 2.9 10*3/uL (ref 0.7–4.0)
MCH: 31.8 pg (ref 26.0–34.0)
MCHC: 34 g/dL (ref 30.0–36.0)
MCV: 93.5 fL (ref 80.0–100.0)
Monocytes Absolute: 1 10*3/uL (ref 0.1–1.0)
Monocytes Relative: 8 %
Neutro Abs: 8.2 10*3/uL — ABNORMAL HIGH (ref 1.7–7.7)
Neutrophils Relative %: 67 %
Platelets: 198 10*3/uL (ref 150–400)
RBC: 4.65 MIL/uL (ref 4.22–5.81)
RDW: 13 % (ref 11.5–15.5)
WBC: 12.2 10*3/uL — ABNORMAL HIGH (ref 4.0–10.5)
nRBC: 0 % (ref 0.0–0.2)

## 2023-03-03 LAB — HEMOGLOBIN A1C
Hgb A1c MFr Bld: 5.7 % — ABNORMAL HIGH (ref 4.8–5.6)
Mean Plasma Glucose: 116.89 mg/dL

## 2023-03-03 LAB — POCT URINE DRUG SCREEN - MANUAL ENTRY (I-SCREEN)
POC Amphetamine UR: NOT DETECTED
POC Buprenorphine (BUP): NOT DETECTED
POC Cocaine UR: POSITIVE — AB
POC Marijuana UR: POSITIVE — AB
POC Methadone UR: NOT DETECTED
POC Methamphetamine UR: NOT DETECTED
POC Morphine: NOT DETECTED
POC Oxazepam (BZO): NOT DETECTED
POC Oxycodone UR: NOT DETECTED
POC Secobarbital (BAR): NOT DETECTED

## 2023-03-03 LAB — COMPREHENSIVE METABOLIC PANEL
ALT: 21 U/L (ref 0–44)
AST: 27 U/L (ref 15–41)
Albumin: 4.2 g/dL (ref 3.5–5.0)
Alkaline Phosphatase: 93 U/L (ref 38–126)
Anion gap: 13 (ref 5–15)
BUN: 11 mg/dL (ref 6–20)
CO2: 22 mmol/L (ref 22–32)
Calcium: 9.8 mg/dL (ref 8.9–10.3)
Chloride: 104 mmol/L (ref 98–111)
Creatinine, Ser: 1.19 mg/dL (ref 0.61–1.24)
GFR, Estimated: >60 mL/min (ref >60)
Glucose, Bld: 87 mg/dL (ref 70–99)
Potassium: 4.1 mmol/L (ref 3.5–5.1)
Sodium: 139 mmol/L (ref 135–145)
Total Bilirubin: 1.2 mg/dL (ref 0.0–1.2)
Total Protein: 7.2 g/dL (ref 6.5–8.1)

## 2023-03-03 LAB — LIPID PANEL
Cholesterol: 159 mg/dL (ref 0–200)
HDL: 39 mg/dL — ABNORMAL LOW (ref >40)
LDL Cholesterol: 107 mg/dL — ABNORMAL HIGH (ref 0–99)
Total CHOL/HDL Ratio: 4.1 {ratio}
Triglycerides: 65 mg/dL (ref <150)
VLDL: 13 mg/dL (ref 0–40)

## 2023-03-03 LAB — ETHANOL: Alcohol, Ethyl (B): <10 mg/dL (ref <10)

## 2023-03-03 LAB — TSH: TSH: 0.697 u[IU]/mL (ref 0.350–4.500)

## 2023-03-03 MED ORDER — ALUM & MAG HYDROXIDE-SIMETH 200-200-20 MG/5ML PO SUSP
30.0000 mL | ORAL | Status: DC | PRN
  Administered 2023-03-04 – 2023-03-11 (×3): 30 mL via ORAL
  Filled 2023-03-03 (×3): qty 30

## 2023-03-03 MED ORDER — LOPERAMIDE HCL 2 MG PO CAPS: 2.0000 mg | ORAL_CAPSULE | ORAL | Status: DC | PRN

## 2023-03-03 MED ORDER — MAGNESIUM HYDROXIDE 400 MG/5ML PO SUSP: 30.0000 mL | Freq: Every day | ORAL | Status: DC | PRN

## 2023-03-03 MED ORDER — LORAZEPAM 1 MG PO TABS: 1.0000 mg | ORAL_TABLET | Freq: Four times a day (QID) | ORAL | Status: DC | PRN

## 2023-03-03 MED ORDER — OLANZAPINE 5 MG PO TBDP: 5.0000 mg | ORAL_TABLET | Freq: Three times a day (TID) | ORAL | Status: DC | PRN

## 2023-03-03 MED ORDER — MAGNESIUM HYDROXIDE 400 MG/5ML PO SUSP
30.0000 mL | Freq: Every day | ORAL | Status: DC | PRN
  Administered 2023-03-05 – 2023-03-07 (×2): 30 mL via ORAL
  Filled 2023-03-03 (×3): qty 30

## 2023-03-03 MED ORDER — ALUM & MAG HYDROXIDE-SIMETH 200-200-20 MG/5ML PO SUSP: 30.0000 mL | ORAL | Status: DC | PRN

## 2023-03-03 MED ORDER — HYDROXYZINE HCL 25 MG PO TABS
25.0000 mg | ORAL_TABLET | Freq: Three times a day (TID) | ORAL | Status: DC | PRN
  Administered 2023-03-03: 25 mg via ORAL
  Filled 2023-03-03: qty 1

## 2023-03-03 MED ORDER — THIAMINE MONONITRATE 100 MG PO TABS: 100.0000 mg | ORAL_TABLET | Freq: Every day | ORAL | Status: DC

## 2023-03-03 MED ORDER — ACETAMINOPHEN 325 MG PO TABS: 650.0000 mg | ORAL_TABLET | Freq: Four times a day (QID) | ORAL | Status: DC | PRN

## 2023-03-03 MED ORDER — TRAZODONE HCL 50 MG PO TABS
50.0000 mg | ORAL_TABLET | Freq: Every evening | ORAL | Status: DC | PRN
  Administered 2023-03-05 – 2023-03-13 (×5): 50 mg via ORAL
  Filled 2023-03-03 (×7): qty 1
  Filled 2023-03-03: qty 14
  Filled 2023-03-03: qty 1

## 2023-03-03 MED ORDER — HYDROXYZINE HCL 25 MG PO TABS
25.0000 mg | ORAL_TABLET | Freq: Three times a day (TID) | ORAL | Status: DC | PRN
  Administered 2023-03-07 – 2023-03-13 (×6): 25 mg via ORAL
  Filled 2023-03-03: qty 1
  Filled 2023-03-03: qty 20
  Filled 2023-03-03 (×5): qty 1
  Filled 2023-03-03: qty 30
  Filled 2023-03-03: qty 1

## 2023-03-03 MED ORDER — ONDANSETRON 4 MG PO TBDP: 4.0000 mg | ORAL_TABLET | Freq: Four times a day (QID) | ORAL | Status: DC | PRN

## 2023-03-03 MED ORDER — THIAMINE HCL 100 MG/ML IJ SOLN
100.0000 mg | Freq: Once | INTRAMUSCULAR | Status: AC
  Administered 2023-03-03: 100 mg via INTRAMUSCULAR
  Filled 2023-03-03: qty 2

## 2023-03-03 MED ORDER — ARIPIPRAZOLE 5 MG PO TABS: 5.0000 mg | ORAL_TABLET | Freq: Every day | ORAL | Status: DC

## 2023-03-03 MED ORDER — OLANZAPINE 10 MG IM SOLR: 10.0000 mg | Freq: Three times a day (TID) | INTRAMUSCULAR | Status: DC | PRN

## 2023-03-03 MED ORDER — OLANZAPINE 10 MG IM SOLR: 5.0000 mg | Freq: Three times a day (TID) | INTRAMUSCULAR | Status: DC | PRN

## 2023-03-03 MED ORDER — TRAZODONE HCL 50 MG PO TABS
50.0000 mg | ORAL_TABLET | Freq: Every evening | ORAL | Status: DC | PRN
  Administered 2023-03-03: 50 mg via ORAL
  Filled 2023-03-03: qty 1

## 2023-03-03 MED ORDER — ADULT MULTIVITAMIN W/MINERALS CH
1.0000 | ORAL_TABLET | Freq: Every day | ORAL | Status: DC
  Administered 2023-03-03: 1 via ORAL
  Filled 2023-03-03: qty 1

## 2023-03-03 MED ORDER — ACETAMINOPHEN 325 MG PO TABS
650.0000 mg | ORAL_TABLET | Freq: Four times a day (QID) | ORAL | Status: DC | PRN
  Administered 2023-03-05 – 2023-03-12 (×7): 650 mg via ORAL
  Filled 2023-03-03 (×8): qty 2

## 2023-03-03 NOTE — Progress Notes (Incomplete)
Patient ID: Raymond Mcclure, male   DOB: Sep 09, 1965, 58 y.o.   MRN: [id number]  Patient admitted to [facility] - 307-[facility], with the Dx: Bipolar DO. He presented with SI with Plan to Jump off bridge or OD. Patient states he had AH of a male telling him to jump off the bridge but a male stating no, just OD on Fetanyl. He states at times he sees shadows. Patient currently denies SI/HI/AVH. He has allergies to Bee Venom, Peanuts, Peanut Oil, and Covid 19 Vaccine- Moderna. He states his stressors include taking care of his elderly mom with dementia and family dynamics between his daughter/grandchildren. P  Per chart review:  HPI: ANTONI Mcclure is a 58 year old male patient with a past psychiatric history significant for bipolar affective, depressed, anxiety, SI, and polysubstance abuse (alcohol, cocaine fentanyl) who presented to the [facility] voluntary with complaints of SI with a plan, auditory hallucinations, and substance use.   Patient states that he told a security or a Emergency planning/management officer while standing at the school that he needed "mental help" because he felt like he was going to hurt himself or someone else. He states that he was standing at an "old school" to clear his mind.    He states that he was hearing a male voices telling him to jump off a bridge and a male voices was telling him not to jump off the bridge because he may survive and to use drugs to kill himself. He reports auditory hallucinations for the past two weeks. He reports seeking shadows for a while. There is no objective evidence that the patient is responding to internal or external stimuli on exam.     Patient endorses SI with a plan to jump off bridge or OD. He endorses passive HI towards no specific person, no plan or intent. He reports wanting to "snatch" his grandson up earlier today. He reports four past suicide attempts in the past, last suicide attempt two years ago by overdosing on fentanyl.     Patient reports worsening depression and endorses depressive symptoms of sadness, hopelessness, worthlessness, irritability, agitation, anhedonia, poor sleep and poor appetite.   Patient reports a significant history of cocaine abuse. He reports using cocaine more heavily for the past two weeks and states that he has been using to kill himself rather than to get high. He reports spending $600 on cocaine daily and states that he last used cocaine earlier today. He reports occasional marijuana use. He reports drinking alcohol since age 35 year old. He reports drinking alcohol daily, a gallon of liquor per day. He states that he last consumed alcohol last night. He denies alcohol withdrawal. He denies history of alcohol withdrawal seizures or DT's.    Patient states that he is from [place]. He reports currently living with his mother whom he is caring for due to dementia, his daughter, daughter's four kids and niece. Patient states that he is married but is currently separated. Patient denies legal issues.    A reported history of heart ablation, hypertension, GERD and sleep apnea.

## 2023-03-03 NOTE — Progress Notes (Signed)
   03/03/23 1450  [facility] Triage Screening (Walk-ins at [facility] only)  How Did You Hear About [place]? Self  What Is the Reason for Your Visit/Call Today? Patient is a 58 y.o. male with a hx of Schizoaffective Disorder, Bipolar Type and Cocaine Use Disorder, moderate who presents for assessment.  Patient states he has been struggling with worsening depression, SI and HI recently since being off of his medicaitons.  He states he moved to [facility] [place] last year to stay with his mother to help care for her.  He states he's been busy with her and has not been able to find a psychiatrist in the are to continue his medications.  Patient states he has been self-medicating with cocaine, using approximately $600 daily for the past two weeks.  He states he has been using his savings for drugs and he's blown through a good portion of this account.  Patient states he felt overwhelmed "with my mom yelling out" last night.  He became agitated and had thoughts about "choking out my grandson" who was also bothering him at the time.  He decided to leave the house and he's been walking most of the night, up until he met a woman on the streets and stayed with her in a hotel.  Patient states he has been hearing voices as well, a male and a male voice, mostly.  Today, the male voice told him to jump from the bridge near the hotel, on [address].  He walked to the bridge and the male voice began to tell him not to jump and to "go kill yourself with drugs."  Patient states he felt overwhelmed and decided to get help.  Patient endorses SI, and he is unable to affirm his safety.  He denies current HI.  How Long Has This Been Causing You Problems? 1-6 months  Have You Recently Had Any Thoughts About Hurting Yourself? Yes  How long ago did you have thoughts about hurting yourself? current  Are You Planning to Commit Suicide/Harm Yourself At This time? Yes  Have you Recently Had Thoughts About Hurting Someone Karolee Ohs? No  Are You Planning To Harm Someone  At This Time? No  Physical Abuse Denies  Verbal Abuse Denies  Sexual Abuse Denies  Exploitation of patient/patient's resources Denies  Self-Neglect Denies  Are you currently experiencing any auditory, visual or other hallucinations? Yes  Please explain the hallucinations you are currently experiencing: command voices to harm himself  Have You Used Any Alcohol or Drugs in the Past 24 Hours? Yes  What Did You Use and How Much? cocaine  Do you have any current medical co-morbidities that require immediate attention? No  Clinician description of patient physical appearance/behavior: Patient appears distressed, he is somewhat disheveled.  He is AAOx4  What Do You Feel Would Help You the Most Today? Treatment for Depression or other mood problem;Alcohol or Drug Use Treatment  If access to [facility] was not available, would you have sought care in the Emergency Department? Yes  Determination of Need Urgent (48 hours)  Options For Referral Inpatient Hospitalization;Facility-Based Crisis;Medication Management;Outpatient Therapy  Determination of Need filed? Yes

## 2023-03-03 NOTE — ED Notes (Signed)
Pt laying on recliner calm and cooperative no pain or distress denies SI/HI/AVH breathing even and unlabored will continue to monitor for safety

## 2023-03-03 NOTE — ED Provider Notes (Signed)
[facility] Continuous Assessment Admission H&P  Date: 03/03/23 Patient Name: Raymond Mcclure MRN: [id number] Chief Complaint: SI with a plan, auditory hallucinations, and substance use.  Diagnoses:  Final diagnoses:  Bipolar disorder, current episode depressed, severe, with psychotic features (HCC)  Cocaine use  Suicidal ideation  Alcohol use disorder  Homicidal ideations    HPI: Raymond Mcclure is a 58 year old male patient with a past psychiatric history significant for bipolar affective, depressed, anxiety, SI, and polysubstance abuse (alcohol, cocaine fentanyl) who presented to the [facility] voluntary with complaints of SI with a plan, auditory hallucinations, and substance use.  Patient states that he told a security or a Emergency planning/management officer while standing at the school that he needed "mental help" because he felt like he was going to hurt himself or someone else. He states that he was standing at an "old school" to clear his mind.   He states that he was hearing a male voices telling him to jump off a bridge and a male voices was telling him not to jump off the bridge because he may survive and to use drugs to kill himself. He reports auditory hallucinations for the past two weeks. He reports seeking shadows for a while. There is no objective evidence that the patient is responding to internal or external stimuli on exam.    Patient endorses SI with a plan to jump off bridge or OD. He endorses passive HI towards no specific person, no plan or intent. He reports wanting to "snatch" his grandson up earlier today. He reports four past suicide attempts in the past, last suicide attempt two years ago by overdosing on fentanyl.   Patient reports worsening depression and endorses depressive symptoms of sadness, hopelessness, worthlessness, irritability, agitation, anhedonia, poor sleep and poor appetite.  Patient reports a significant history of cocaine  abuse. He reports using cocaine more heavily for the past two weeks and states that he has been using to kill himself rather than to get high. He reports spending $600 on cocaine daily and states that he last used cocaine earlier today. He reports occasional marijuana use. He reports drinking alcohol since age 42 year old. He reports drinking alcohol daily, a gallon of liquor per day. He states that he last consumed alcohol last night. He denies alcohol withdrawal. He denies history of alcohol withdrawal seizures or DT's.   Patient states that he is from [place]. He reports currently living with his mother whom he is caring for due to dementia, his daughter, daughter's four kids and niece. Patient states that he is married but is currently separated. Patient denies legal issues.   A reported history of heart ablation, hypertension, GERD and sleep apnea.   Patient denies outpatient psychiatry or therapy. He states that he receives medication management by [facility]. He states that he's been off his medications for 2 weeks. He is unable to recall name of medications, he does remember taking Wellbutrin and another medication to reduce the desire to use drugs.    Per chart review, 06/12/2021, patient was seen here at the [facility] and was prescribed  abilify 5 mg po daily x 4 days and recommended to increase to 10 mg daily, Wellbutrin 150 mg daily x 4 days and recommended to increase to 300 mg daily and naltrexone 50 mg po daily.  Total Time spent with patient: 45 minutes  Musculoskeletal  Strength & Muscle Tone: within normal limits Gait & Station:  normal Patient leans: N/A  Psychiatric Specialty Exam  Presentation General Appearance:  Disheveled  Eye Contact: Fair  Speech: Clear and Coherent  Speech Volume: Normal  Handedness: Right   Mood and Affect  Mood: Depressed  Affect: Congruent   Thought Process  Thought Processes: Coherent  Descriptions of  Associations:Intact  Orientation:Full (Time, Place and Person)  Thought Content:Logical  Diagnosis of Schizophrenia or Schizoaffective disorder in past: No data recorded Duration of Psychotic Symptoms: Greater than six months  Hallucinations:Hallucinations: Auditory; Visual  Ideas of Reference:Paranoia  Suicidal Thoughts:Suicidal Thoughts: Yes, Active SI Active Intent and/or Plan: With Plan  Homicidal Thoughts:Homicidal Thoughts: Yes, Passive   Sensorium  Memory: Immediate Fair; Recent Fair; Remote Fair  Judgment: Intact  Insight: Present   Executive Functions  Concentration: Fair  Attention Span: Fair  Recall: [employer] of Knowledge: Fair  Language: Fair   Psychomotor Activity  Psychomotor Activity: Psychomotor Activity: Normal   Assets  Assets: Manufacturing systems engineer; Desire for Improvement; Housing; Leisure Time; Physical Health   Sleep  Sleep: Sleep: Poor   Nutritional Assessment (For OBS and FBC admissions only) Has the patient had a weight loss or gain of 10 pounds or more in the last 3 months?: No Has the patient had a decrease in food intake/or appetite?: Yes Does the patient have dental problems?: No Does the patient have eating habits or behaviors that may be indicators of an eating disorder including binging or inducing vomiting?: No Has the patient recently lost weight without trying?: 2.0 Has the patient been eating poorly because of a decreased appetite?: 1 Malnutrition Screening Tool Score: 3 Nutritional Assessment Referrals: Medication/Tx changes    Physical Exam Cardiovascular:     Rate and Rhythm: Tachycardia present.     Comments: Hypertensive  Pulmonary:     Effort: Pulmonary effort is normal.  Musculoskeletal:        General: Normal range of motion.  Neurological:     Mental Status: He is alert and oriented to person, place, and time.    Review of Systems  Constitutional: Negative.   HENT: Negative.    Eyes:  Negative.   Respiratory: Negative.    Cardiovascular: Negative.   Gastrointestinal: Negative.   Genitourinary: Negative.   Musculoskeletal: Negative.   Neurological: Negative.   Endo/Heme/Allergies: Negative.   Psychiatric/Behavioral:  Positive for depression, hallucinations, substance abuse and suicidal ideas.     Blood pressure (!) 157/95, pulse (!) 126, temperature 99.5 F (37.5 C), temperature source Oral, resp. rate 20, SpO2 97%. There is no height or weight on file to calculate BMI.  Past Psychiatric History: history of bipolar affective, depressed, anxiety, SI, and polysubstance abuse (alcohol, cocaine fentanyl). Patient reports 4 past suicide attempts, last attempt two years ago by OD on fentanyl. Patient reports last inpatient hospitalization two years ago at [facility].   Is the patient at risk to self? Yes  Has the patient been a risk to self in the past 6 months? No .    Has the patient been a risk to self within the distant past? Yes   Is the patient a risk to others? Yes   Has the patient been a risk to others in the past 6 months? No   Has the patient been a risk to others within the distant past? No   Past Medical History: a documented history of mild CAD, primary hypertensive, dyslipidemia, atypical chest pain. A reported history of heart ablation, hypertension, GERD and sleep apnea.   Family History:  Per patient, daughter history of bipolar and cousin history of schizophrenia. He reports mother has dementia.   Social History: Patient states that he is from [place]. He reports currently living with his mother whom he is caring for due to dementia, his daughter, daughter's four kids and niece. Patient states that he is married but is currently separated. Patient reports cocaine and alcohol use. Patient denies legal issues.   Last Labs:  Admission on 02/16/2023, Discharged on 02/16/2023  Component Date Value Ref Range Status   Prothrombin Time 02/16/2023 14.0  11.4 -  15.2 seconds Final   INR 02/16/2023 1.1  0.8 - 1.2 Final   Comment: (NOTE) INR goal varies based on device and disease states. Performed at [facility], [address] [place], [place] [address]    aPTT 02/16/2023 33  24 - 36 seconds Final   Performed at [facility], [address] [place], [place] [address]   WBC 02/16/2023 11.6 (H)  4.0 - 10.5 K/uL Final   RBC 02/16/2023 4.44  4.22 - 5.81 MIL/uL Final   Hemoglobin 02/16/2023 14.1  13.0 - 17.0 g/dL Final   HCT 47/42/5956 42.0  39.0 - 52.0 % Final   MCV 02/16/2023 94.6  80.0 - 100.0 fL Final   MCH 02/16/2023 31.8  26.0 - 34.0 pg Final   MCHC 02/16/2023 33.6  30.0 - 36.0 g/dL Final   RDW 38/75/6433 12.1  11.5 - 15.5 % Final   Platelets 02/16/2023 432 (H)  150 - 400 K/uL Final   nRBC 02/16/2023 0.0  0.0 - 0.2 % Final   Performed at [facility] [place], [address], [place], [place] [address]   Neutrophils Relative % 02/16/2023 61  % Final   Neutro Abs 02/16/2023 7.0  1.7 - 7.7 K/uL Final   Lymphocytes Relative 02/16/2023 30  % Final   Lymphs Abs 02/16/2023 3.5  0.7 - 4.0 K/uL Final   Monocytes Relative 02/16/2023 7  % Final   Monocytes Absolute 02/16/2023 0.8  0.1 - 1.0 K/uL Final   Eosinophils Relative 02/16/2023 2  % Final   Eosinophils Absolute 02/16/2023 0.2  0.0 - 0.5 K/uL Final   Basophils Relative 02/16/2023 0  % Final   Basophils Absolute 02/16/2023 0.1  0.0 - 0.1 K/uL Final   Immature Granulocytes 02/16/2023 0  % Final   Abs Immature Granulocytes 02/16/2023 0.04  0.00 - 0.07 K/uL Final   Performed at [facility], [address] [place], [place] [address]   Sodium 02/16/2023 139  135 - 145 mmol/L Final   Potassium 02/16/2023 3.8  3.5 - 5.1 mmol/L Final   Chloride 02/16/2023 104  98 - 111 mmol/L Final   CO2 02/16/2023 24  22 - 32 mmol/L Final   Glucose, Bld 02/16/2023 84  70 - 99 mg/dL Final   Glucose reference range applies only to samples taken after fasting for at least 8 hours.   BUN  02/16/2023 12  6 - 20 mg/dL Final   Creatinine, Ser 02/16/2023 1.06  0.61 - 1.24 mg/dL Final   Calcium 08/01/1599 9.1  8.9 - 10.3 mg/dL Final   Total Protein 09/32/3557 6.8  6.5 - 8.1 g/dL Final   Albumin 32/20/2542 3.4 (L)  3.5 - 5.0 g/dL Final   AST 70/62/3762 24  15 - 41 U/L Final   ALT 02/16/2023 21  0 - 44 U/L Final   Alkaline Phosphatase 02/16/2023 66  38 - 126 U/L Final  Total Bilirubin 02/16/2023 0.5  0.0 - 1.2 mg/dL Final   GFR, Estimated 02/16/2023 >60  >60 mL/min Final   Comment: (NOTE) Calculated using the CKD-EPI Creatinine Equation (2021)    Anion gap 02/16/2023 11  5 - 15 Final   Performed at [facility], [address] [place], [place] [address]   Alcohol, Ethyl (B) 02/16/2023 <10  <10 mg/dL Final   Comment: (NOTE) Lowest detectable limit for serum alcohol is 10 mg/dL.  For medical purposes only. Performed at [facility], [address], [place], [place] [address]    Sodium 02/16/2023 140  135 - 145 mmol/L Final   Potassium 02/16/2023 3.9  3.5 - 5.1 mmol/L Final   Chloride 02/16/2023 103  98 - 111 mmol/L Final   BUN 02/16/2023 13  6 - 20 mg/dL Final   Creatinine, Ser 02/16/2023 1.00  0.61 - 1.24 mg/dL Final   Glucose, Bld 78/46/9629 82  70 - 99 mg/dL Final   Glucose reference range applies only to samples taken after fasting for at least 8 hours.   Calcium, Ion 02/16/2023 1.20  1.15 - 1.40 mmol/L Final   TCO2 02/16/2023 26  22 - 32 mmol/L Final   Hemoglobin 02/16/2023 13.9  13.0 - 17.0 g/dL Final   HCT 52/84/1324 41.0  39.0 - 52.0 % Final   Glucose-Capillary 02/16/2023 112 (H)  70 - 99 mg/dL Final   Glucose reference range applies only to samples taken after fasting for at least 8 hours.  Admission on 02/11/2023, Discharged on 02/11/2023  Component Date Value Ref Range Status   WBC 02/11/2023 15.8 (H)  4.0 - 10.5 K/uL Final   RBC 02/11/2023 4.22  4.22 - 5.81 MIL/uL Final   Hemoglobin 02/11/2023 13.5  13.0 - 17.0 g/dL Final   HCT 40/11/2723 39.2   39.0 - 52.0 % Final   MCV 02/11/2023 92.9  80.0 - 100.0 fL Final   MCH 02/11/2023 32.0  26.0 - 34.0 pg Final   MCHC 02/11/2023 34.4  30.0 - 36.0 g/dL Final   RDW 36/64/4034 11.9  11.5 - 15.5 % Final   Platelets 02/11/2023 391  150 - 400 K/uL Final   nRBC 02/11/2023 0.0  0.0 - 0.2 % Final   Neutrophils Relative % 02/11/2023 73  % Final   Neutro Abs 02/11/2023 11.7 (H)  1.7 - 7.7 K/uL Final   Lymphocytes Relative 02/11/2023 18  % Final   Lymphs Abs 02/11/2023 2.8  0.7 - 4.0 K/uL Final   Monocytes Relative 02/11/2023 7  % Final   Monocytes Absolute 02/11/2023 1.1 (H)  0.1 - 1.0 K/uL Final   Eosinophils Relative 02/11/2023 1  % Final   Eosinophils Absolute 02/11/2023 0.1  0.0 - 0.5 K/uL Final   Basophils Relative 02/11/2023 0  % Final   Basophils Absolute 02/11/2023 0.1  0.0 - 0.1 K/uL Final   Immature Granulocytes 02/11/2023 1  % Final   Abs Immature Granulocytes 02/11/2023 0.09 (H)  0.00 - 0.07 K/uL Final   Performed at [facility], [address], [place], [place] [address]   Sodium 02/11/2023 138  135 - 145 mmol/L Final   Potassium 02/11/2023 4.0  3.5 - 5.1 mmol/L Final   Chloride 02/11/2023 106  98 - 111 mmol/L Final   CO2 02/11/2023 21 (L)  22 - 32 mmol/L Final   Glucose, Bld 02/11/2023 100 (H)  70 - 99 mg/dL Final   Glucose reference range applies only to samples taken  after fasting for at least 8 hours.   BUN 02/11/2023 6  6 - 20 mg/dL Final   Creatinine, Ser 02/11/2023 1.10  0.61 - 1.24 mg/dL Final   Calcium 40/98/1191 9.1  8.9 - 10.3 mg/dL Final   Total Protein 47/82/9562 6.7  6.5 - 8.1 g/dL Final   Albumin 13/09/6576 3.3 (L)  3.5 - 5.0 g/dL Final   AST 46/96/2952 18  15 - 41 U/L Final   ALT 02/11/2023 21  0 - 44 U/L Final   Alkaline Phosphatase 02/11/2023 76  38 - 126 U/L Final   Total Bilirubin 02/11/2023 0.7  0.0 - 1.2 mg/dL Final   GFR, Estimated 02/11/2023 >60  >60 mL/min Final   Comment: (NOTE) Calculated using the CKD-EPI Creatinine Equation (2021)    Anion  gap 02/11/2023 11  5 - 15 Final   Performed at [facility], [address], [place], [place] [address]    Allergies: Bee venom, Peanut oil, Peanut-containing drug products, and Covid-19 (mrna) vaccine  Medications:  Facility Ordered Medications  Medication   acetaminophen (TYLENOL) tablet 650 mg   alum & mag hydroxide-simeth (MAALOX/MYLANTA) 200-200-20 MG/5ML suspension 30 mL   magnesium hydroxide (MILK OF MAGNESIA) suspension 30 mL   hydrOXYzine (ATARAX) tablet 25 mg   traZODone (DESYREL) tablet 50 mg   thiamine (VITAMIN B1) injection 100 mg   [START ON 03/04/2023] thiamine (VITAMIN B1) tablet 100 mg   multivitamin with minerals tablet 1 tablet   LORazepam (ATIVAN) tablet 1 mg   loperamide (IMODIUM) capsule 2-4 mg   ondansetron (ZOFRAN-ODT) disintegrating tablet 4 mg   PTA Medications  Medication Sig   [employer] ASPIRIN ADULT LOW DOSE 81 MG chewable tablet Chew 81 mg by mouth daily.   Vitamin D, Ergocalciferol, (DRISDOL) 1.25 MG (50000 UNIT) CAPS capsule Take 50,000 Units by mouth once a week.   BEVESPI AEROSPHERE 9-4.8 MCG/ACT AERO Inhale 2 puffs into the lungs 2 (two) times daily.   naproxen (NAPROSYN) 500 MG tablet Take 1 tablet (500 mg total) by mouth 2 (two) times daily.   naltrexone (DEPADE) 50 MG tablet Take 1 tablet (50 mg total) by mouth daily.   meclizine (ANTIVERT) 25 MG tablet Take 1 tablet (25 mg total) by mouth 3 (three) times daily as needed for dizziness.   omeprazole (PRILOSEC) 20 MG capsule Take 1 capsule (20 mg total) by mouth daily.   ondansetron (ZOFRAN) 4 MG tablet Take 1 tablet (4 mg total) by mouth every 8 (eight) hours as needed for nausea or vomiting.   buPROPion (WELLBUTRIN XL) 300 MG 24 hr tablet Take 1 tablet (300 mg total) by mouth daily.   ARIPiprazole (ABILIFY) 10 MG tablet TAKE 1 TABLET BY MOUTH DAILY   amoxicillin-clavulanate (AUGMENTIN) 875-125 MG tablet Take 1 tablet by mouth 2 (two) times daily.   atorvastatin (LIPITOR) 10 MG tablet     isosorbide mononitrate (IMDUR) 30 MG 24 hr tablet    losartan (COZAAR) 25 MG tablet       Medical Decision Making  Patient recommended for inpatient psychiatric treatment for mood stabilization, medication management and safety due to worsening depression, SH, HI, auditory and visual hallucinations, and substance use. Patient is a voluntary.  Medication regimen Will start Abilify 5 mg  daily for mood stabilization consider titrating over the course of hospitalization to 10 mg daily Will hold Wellbutrin due to patient reported heavy alcohol use and risk for seizures due to Wellbutrin lowering seizure threshold Will add CIWA to monitor for  alcohol withdrawal and Ativan 1 mg every 6 hours prn to be administered for CIWA greater than 10. Consider adding ativan or librium taper if severe withdrawal symptoms occur   Lab Orders         CBC with Differential/Platelet         Comprehensive metabolic panel         Hemoglobin A1c         Ethanol         Lipid panel         TSH         POCT Urine Drug Screen - (I-Screen)    EKG   Vital signs. Nursing to repeat vital signs manually.   Recommendations  Based on my evaluation the patient does not appear to have an emergency medical condition.  Layla Barter, NP 03/03/23  4:39 PM

## 2023-03-03 NOTE — Progress Notes (Signed)
Pt was accepted to [facility] TODAY  03/03/2023; Bed Assignment 307-1  Uploaded Signed Voluntary consent to pt's chart  Pt meets inpatient criteria per Loreen Freud  Attending Physician will be Dr. Phineas Inches, MD   Report can be called to:  -Adult unit: [phone]  Pt can arrive after: BED IS READY  Care Team notified: [facility] Rosey Bath, RN, Night [facility] Fransico Michael, RN, Patrice White,RN, Lavonna Monarch, Herbie Drape [address] [place], [place] 03/03/2023 @ 6:40 PM
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
## 2023-03-03 NOTE — ED Notes (Signed)
Patient reports not being able to sleep and reports having some anxiety. Trazodone 50mg  PO and Hydroxyzine 25mg  PO given. Staff will continue to monitor safety and changes in condition.
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
## 2023-03-03 NOTE — Progress Notes (Addendum)
Patient ID: Raymond Mcclure, male   DOB: [birth year], 58 y.o.   MRN: [id number]  Patient admitted to [facility], with the Dx: Bipolar DO. He presented with SI with Plan to Jump off bridge or OD. Patient states he had AH of a male telling him to jump off the bridge but a male stating no, just OD on Fetanyl. He states at times he sees shadows. Patient currently denies SI/HI/AVH. Substance use includes, Cocaine, THC, ETOH, and Fetanyl.  He has allergies to Bee Venom, Peanuts, Peanut Oil, and Covid 19 Vaccine- Moderna. He states his stressors include taking care of his elderly mom with dementia and family dynamics between his daughter/grandchildren. Non invasive search for contraband performed/skin assessment with assigned staff member. See EPIC Skin assessment. Consents obtained/admission process completed. Patient verbalized understanding. He states he has vertigo and has 2+ falls in last 6 months. He was given HFR socks and bracelet. In addition Clinical research associate educated patient on fall prevention. Patient oriented to the unit/given unit schedule. He was offered a sandwich/snack/fluids and accepted. Safety on unit maintained.   Per chart review:  HPI: Raymond Mcclure is a 58 year old male patient with a past psychiatric history significant for bipolar affective, depressed, anxiety, SI, and polysubstance abuse (alcohol, cocaine fentanyl) who presented to the [facility] voluntary with complaints of SI with a plan, auditory hallucinations, and substance use.   Patient states that he told a security or a Emergency planning/management officer while standing at the school that he needed "mental help" because he felt like he was going to hurt himself or someone else. He states that he was standing at an "old school" to clear his mind.    He states that he was hearing a male voices telling him to jump off a bridge and a male voices was telling him not to jump off the bridge because he may survive and to use drugs  to kill himself. He reports auditory hallucinations for the past two weeks. He reports seeking shadows for a while. There is no objective evidence that the patient is responding to internal or external stimuli on exam.     Patient endorses SI with a plan to jump off bridge or OD. He endorses passive HI towards no specific person, no plan or intent. He reports wanting to "snatch" his grandson up earlier today. He reports four past suicide attempts in the past, last suicide attempt two years ago by overdosing on fentanyl.    Patient reports worsening depression and endorses depressive symptoms of sadness, hopelessness, worthlessness, irritability, agitation, anhedonia, poor sleep and poor appetite.   Patient reports a significant history of cocaine abuse. He reports using cocaine more heavily for the past two weeks and states that he has been using to kill himself rather than to get high. He reports spending $600 on cocaine daily and states that he last used cocaine earlier today. He reports occasional marijuana use. He reports drinking alcohol since age 12 year old. He reports drinking alcohol daily, a gallon of liquor per day. He states that he last consumed alcohol last night. He denies alcohol withdrawal. He denies history of alcohol withdrawal seizures or DT's.    Patient states that he is from [place]. He reports currently living with his mother whom he is caring for due to dementia, his daughter, daughter's four kids and niece. Patient states that he is married but is currently separated. Patient denies legal issues.    A reported history of  heart ablation, hypertension, GERD and sleep apnea.

## 2023-03-04 ENCOUNTER — Encounter (HOSPITAL_COMMUNITY): Payer: Self-pay

## 2023-03-04 ENCOUNTER — Encounter (HOSPITAL_COMMUNITY): Payer: Self-pay | Admitting: Psychiatry

## 2023-03-04 DIAGNOSIS — F1020 Alcohol dependence, uncomplicated: Secondary | ICD-10-CM | POA: Insufficient documentation

## 2023-03-04 DIAGNOSIS — F251 Schizoaffective disorder, depressive type: Secondary | ICD-10-CM | POA: Diagnosis not present

## 2023-03-04 DIAGNOSIS — F1590 Other stimulant use, unspecified, uncomplicated: Secondary | ICD-10-CM | POA: Insufficient documentation

## 2023-03-04 MED ORDER — ONDANSETRON 4 MG PO TBDP: 4.0000 mg | ORAL_TABLET | Freq: Four times a day (QID) | ORAL | Status: AC | PRN

## 2023-03-04 MED ORDER — LOSARTAN POTASSIUM 50 MG PO TABS
50.0000 mg | ORAL_TABLET | Freq: Every day | ORAL | Status: DC
  Administered 2023-03-04 – 2023-03-14 (×11): 50 mg via ORAL
  Filled 2023-03-04 (×4): qty 1
  Filled 2023-03-04 (×2): qty 21
  Filled 2023-03-04 (×2): qty 1
  Filled 2023-03-04 (×2): qty 14
  Filled 2023-03-04: qty 21
  Filled 2023-03-04 (×2): qty 1

## 2023-03-04 MED ORDER — PANTOPRAZOLE SODIUM 40 MG PO TBEC
40.0000 mg | DELAYED_RELEASE_TABLET | Freq: Every day | ORAL | Status: DC
  Administered 2023-03-04 – 2023-03-05 (×2): 40 mg via ORAL
  Filled 2023-03-04 (×3): qty 1

## 2023-03-04 MED ORDER — LORAZEPAM 1 MG PO TABS: 1.0000 mg | ORAL_TABLET | Freq: Four times a day (QID) | ORAL | Status: AC | PRN

## 2023-03-04 MED ORDER — DILTIAZEM HCL ER COATED BEADS 120 MG PO CP24
240.0000 mg | ORAL_CAPSULE | Freq: Every day | ORAL | Status: DC
  Administered 2023-03-04 – 2023-03-14 (×11): 240 mg via ORAL
  Filled 2023-03-04: qty 1
  Filled 2023-03-04: qty 28
  Filled 2023-03-04 (×2): qty 1
  Filled 2023-03-04: qty 42
  Filled 2023-03-04: qty 1
  Filled 2023-03-04: qty 28
  Filled 2023-03-04: qty 2
  Filled 2023-03-04: qty 1
  Filled 2023-03-04: qty 2
  Filled 2023-03-04 (×3): qty 1
  Filled 2023-03-04 (×2): qty 42

## 2023-03-04 MED ORDER — LOPERAMIDE HCL 2 MG PO CAPS: 2.0000 mg | ORAL_CAPSULE | ORAL | Status: AC | PRN

## 2023-03-04 MED ORDER — MECLIZINE HCL 12.5 MG PO TABS: 25.0000 mg | ORAL_TABLET | Freq: Three times a day (TID) | ORAL | Status: DC | PRN

## 2023-03-04 MED ORDER — UMECLIDINIUM-VILANTEROL 62.5-25 MCG/ACT IN AEPB
1.0000 | INHALATION_SPRAY | Freq: Every day | RESPIRATORY_TRACT | Status: DC
  Administered 2023-03-04 – 2023-03-14 (×11): 1 via RESPIRATORY_TRACT
  Filled 2023-03-04 (×2): qty 14

## 2023-03-04 MED ORDER — UMECLIDINIUM BROMIDE 62.5 MCG/ACT IN AEPB: 1.0000 | INHALATION_SPRAY | Freq: Every day | RESPIRATORY_TRACT | Status: DC

## 2023-03-04 MED ORDER — ARFORMOTEROL TARTRATE 15 MCG/2ML IN NEBU: 15.0000 ug | INHALATION_SOLUTION | Freq: Two times a day (BID) | RESPIRATORY_TRACT | Status: DC

## 2023-03-04 MED ORDER — RISPERIDONE 1 MG PO TABS
1.0000 mg | ORAL_TABLET | Freq: Every day | ORAL | Status: DC
  Administered 2023-03-04: 1 mg via ORAL
  Filled 2023-03-04 (×2): qty 1

## 2023-03-04 MED ORDER — ATORVASTATIN CALCIUM 10 MG PO TABS
10.0000 mg | ORAL_TABLET | Freq: Every day | ORAL | Status: DC
  Administered 2023-03-04 – 2023-03-14 (×11): 10 mg via ORAL
  Filled 2023-03-04: qty 1
  Filled 2023-03-04: qty 14
  Filled 2023-03-04 (×2): qty 1
  Filled 2023-03-04: qty 21
  Filled 2023-03-04 (×3): qty 1
  Filled 2023-03-04: qty 14
  Filled 2023-03-04: qty 21
  Filled 2023-03-04: qty 14
  Filled 2023-03-04 (×2): qty 1
  Filled 2023-03-04: qty 21

## 2023-03-04 MED ORDER — ASPIRIN 81 MG PO CHEW
81.0000 mg | CHEWABLE_TABLET | Freq: Every day | ORAL | Status: DC
  Administered 2023-03-04 – 2023-03-14 (×11): 81 mg via ORAL
  Filled 2023-03-04: qty 1
  Filled 2023-03-04: qty 21
  Filled 2023-03-04 (×2): qty 1
  Filled 2023-03-04 (×2): qty 14
  Filled 2023-03-04 (×6): qty 1
  Filled 2023-03-04 (×2): qty 21
  Filled 2023-03-04: qty 1

## 2023-03-04 MED ORDER — VITAMIN B-1 100 MG PO TABS
100.0000 mg | ORAL_TABLET | Freq: Every day | ORAL | Status: DC
  Administered 2023-03-05 – 2023-03-14 (×10): 100 mg via ORAL
  Filled 2023-03-04 (×13): qty 1

## 2023-03-04 MED ORDER — ISOSORBIDE MONONITRATE ER 30 MG PO TB24
30.0000 mg | ORAL_TABLET | Freq: Every day | ORAL | Status: DC
  Administered 2023-03-04 – 2023-03-14 (×11): 30 mg via ORAL
  Filled 2023-03-04 (×5): qty 1
  Filled 2023-03-04 (×2): qty 21
  Filled 2023-03-04: qty 1
  Filled 2023-03-04: qty 14
  Filled 2023-03-04: qty 1
  Filled 2023-03-04: qty 21
  Filled 2023-03-04: qty 1
  Filled 2023-03-04: qty 14

## 2023-03-04 MED ORDER — ADULT MULTIVITAMIN W/MINERALS CH
1.0000 | ORAL_TABLET | Freq: Every day | ORAL | Status: DC
  Administered 2023-03-04 – 2023-03-14 (×11): 1 via ORAL
  Filled 2023-03-04 (×14): qty 1

## 2023-03-04 MED ORDER — NICOTINE 14 MG/24HR TD PT24
14.0000 mg | MEDICATED_PATCH | Freq: Every day | TRANSDERMAL | Status: DC
Start: 2023-03-04 — End: 2023-03-14
  Administered 2023-03-04 – 2023-03-14 (×11): 14 mg via TRANSDERMAL
  Filled 2023-03-04 (×2): qty 1
  Filled 2023-03-04: qty 21
  Filled 2023-03-04 (×3): qty 1
  Filled 2023-03-04: qty 14
  Filled 2023-03-04: qty 21
  Filled 2023-03-04: qty 14
  Filled 2023-03-04 (×3): qty 1
  Filled 2023-03-04: qty 21

## 2023-03-04 NOTE — Group Note (Signed)
Recreation Therapy Group Note   Group Topic:Problem Solving  Group Date: 03/04/2023 Start Time: 0930 End Time: 1015 Facilitators: Laysa Kimmey-McCall, LRT,CTRS Location: 300 Hall Dayroom   Group Topic: Problem Solving  Goal Area(s) Addresses:  Patient will effectively work in a team with other group members. Patient will verbalize importance of using appropriate problem solving techniques.  Patient will identify positive change associated with effective problem solving skills.   Intervention: Worksheets, Music  Activity: Patients were given two sheets of brain teasers. Patients were to work through each puzzle to come up with the answer. Patients had the option of working together or individually.    Education: Problem solving, Use of skills post discharge  Education Outcome: Acknowledges understanding/In group clarification offered/Needs additional education.    Affect/Mood: N/A   Participation Level: Did not attend    Clinical Observations/Individualized Feedback:     Plan: Continue to engage patient in RT group sessions 2-3x/week.   Chyanna Flock-McCall, LRT,CTRS 03/04/2023 11:48 AM

## 2023-03-04 NOTE — Progress Notes (Signed)
     03/04/2023       3:23 PM   Raymond Mcclure   Type of Note: Residential Tx  Pt reports being interested in a 30-45 day substance use treatment program. Referrals will be faxed for [facility] and [facility] on Monday, 2/3 (H&P not completed yet). Will continue to assist.  Signed:  Morayo Leven, LCSW-A 03/04/2023  3:23 PM

## 2023-03-04 NOTE — [facility] Group Notes (Signed)
[facility] Group Notes:  (Nursing/MHT/Case Management/Adjunct)  Date:  03/04/2023  Time:  8:21 PM  Type of Therapy:   AA Group  Participation Level:  Active  Participation Quality:  Appropriate  Affect:  Appropriate  Cognitive:  Appropriate  Insight:  Appropriate  Engagement in Group:  Engaged  Modes of Intervention:  Education  Summary of Progress/Problems: Pt attended [employer].  Noah Delaine 03/04/2023, 8:21 PM

## 2023-03-04 NOTE — Group Note (Signed)
Date:  03/04/2023 Time:  1:39 PM  Group Topic/Focus:  Goals Group:   The focus of this group is to help patients establish daily goals to achieve during treatment and discuss how the patient can incorporate goal setting into their daily lives to aide in recovery. Orientation:   The focus of this group is to educate the patient on the purpose and policies of crisis stabilization and provide a format to answer questions about their admission.  The group details unit policies and expectations of patients while admitted.    Participation Level:  Did Not Attend  Participation Quality:   n/a  Affect:   n/a  Cognitive:   n/a  Insight: None  Engagement in Group:   n/a  Modes of Intervention:   n/a  Additional Comments:   Pt did not attend.  Raymond Mcclure 03/04/2023, 1:39 PM

## 2023-03-04 NOTE — Plan of Care (Signed)
  Problem: Education: Goal: Knowledge of [facility] Education information/materials will improve Outcome: Progressing Goal: Emotional status will improve Outcome: Progressing Goal: Mental status will improve Outcome: Progressing Goal: Verbalization of understanding the information provided will improve Outcome: Progressing   Problem: Activity: Goal: Interest or engagement in activities will improve Outcome: Progressing   Problem: Coping: Goal: Ability to verbalize frustrations and anger appropriately will improve Outcome: Progressing Goal: Ability to demonstrate self-control will improve Outcome: Progressing

## 2023-03-04 NOTE — H&P (Signed)
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 Tobacco Use Disorder  - Nicotine patch 21mg /24 hours ordered  - Smoking cessation encouraged.  # Hypertension - Restarting home meds including diltiazem 240, Imdur 30, losartan 50 mg  #Hyperlipidemia - Restart Lipitor 10 mg  #Arthrosclerosis #Left vertebral artery, carotid artery stenosis - Restart home aspirin 81 mg  #Obstructive sleep apnea -Encouraged patient to retrieve CPAP from [facility] when able  4. Discharge Planning:   - Estimated discharge date: 5 to 7 days - Social work and case management to assist with discharge planning and identification of hospital follow-up needs prior to discharge. - Discharge concerns: Need to establish a safety plan; medication compliance and effectiveness. - Discharge goals: Patient reports interest  in 30 to 45-day substance use treatment program.  Referrals will be faxed to [facility] and [facility] on Monday 2/3.  I certify that inpatient services furnished can reasonably be expected to improve the patient's condition.    Luiz Iron, MD PGY-1, Psychiatry Residency  1/31/20255:25 PM

## 2023-03-04 NOTE — Progress Notes (Signed)
   03/04/23 1000  Psych Admission Type (Psych Patients Only)  Admission Status Voluntary  Psychosocial Assessment  Patient Complaints None;Other (Comment) (auditory hallucinations)  Eye Contact Fair  Facial Expression Animated  Affect Anxious  Speech Logical/coherent  Interaction Assertive  Motor Activity Other (Comment) (WNL)  Appearance/Hygiene Unremarkable  Behavior Characteristics Cooperative;Appropriate to situation  Mood Depressed;Anxious;Pleasant  Thought Process  Coherency WDL  Content WDL  Delusions None reported or observed  Perception Hallucinations  Hallucination Auditory  Judgment Limited  Confusion None  Danger to Self  Current suicidal ideation? Denies  Description of Suicide Plan none  Agreement Not to Harm Self Yes  Description of Agreement verbal  Danger to Others  Danger to Others None reported or observed

## 2023-03-04 NOTE — BH IP Treatment Plan (Signed)
Interdisciplinary Treatment and Diagnostic Plan Update  03/04/2023 Time of Session: 10:35 AM Raymond Mcclure MRN: [id number]  Principal Diagnosis: Bipolar disorder, mixed (HCC)  Secondary Diagnoses: Active Problems:   Schizoaffective disorder, depressive type (HCC)   Stimulant use disorder   Alcohol use disorder, severe, dependence (HCC)   Current Medications:  Current Facility-Administered Medications  Medication Dose Route Frequency Provider Last Rate Last Admin   acetaminophen (TYLENOL) tablet 650 mg  650 mg Oral Q6H PRN Sindy Guadeloupe, NP       alum & mag hydroxide-simeth (MAALOX/MYLANTA) 200-200-20 MG/5ML suspension 30 mL  30 mL Oral Q4H PRN Sindy Guadeloupe, NP       aspirin chewable tablet 81 mg  81 mg Oral Daily Massengill, Harrold Donath, MD   81 mg at 03/04/23 1320   atorvastatin (LIPITOR) tablet 10 mg  10 mg Oral Daily Massengill, Harrold Donath, MD   10 mg at 03/04/23 1321   diltiazem (CARDIZEM CD) 24 hr capsule 240 mg  240 mg Oral Daily Massengill, Harrold Donath, MD   240 mg at 03/04/23 1320   hydrOXYzine (ATARAX) tablet 25 mg  25 mg Oral TID PRN Sindy Guadeloupe, NP       isosorbide mononitrate (IMDUR) 24 hr tablet 30 mg  30 mg Oral Daily Massengill, Harrold Donath, MD   30 mg at 03/04/23 1321   loperamide (IMODIUM) capsule 2-4 mg  2-4 mg Oral PRN Massengill, Harrold Donath, MD       LORazepam (ATIVAN) tablet 1 mg  1 mg Oral Q6H PRN Massengill, Harrold Donath, MD       losartan (COZAAR) tablet 50 mg  50 mg Oral Daily Massengill, Harrold Donath, MD   50 mg at 03/04/23 1321   magnesium hydroxide (MILK OF MAGNESIA) suspension 30 mL  30 mL Oral Daily PRN Sindy Guadeloupe, NP       meclizine (ANTIVERT) tablet 25 mg  25 mg Oral TID PRN Phineas Inches, MD       multivitamin with minerals tablet 1 tablet  1 tablet Oral Daily Massengill, Nathan, MD   1 tablet at 03/04/23 1322   nicotine (NICODERM CQ - dosed in mg/24 hours) patch 14 mg  14 mg Transdermal Daily Sindy Guadeloupe, NP   14 mg at 03/04/23 0823   OLANZapine (ZYPREXA) injection 10 mg   10 mg Intramuscular TID PRN Sindy Guadeloupe, NP       OLANZapine (ZYPREXA) injection 5 mg  5 mg Intramuscular TID PRN Sindy Guadeloupe, NP       OLANZapine zydis (ZYPREXA) disintegrating tablet 5 mg  5 mg Oral TID PRN Sindy Guadeloupe, NP       ondansetron (ZOFRAN-ODT) disintegrating tablet 4 mg  4 mg Oral Q6H PRN Massengill, Harrold Donath, MD       pantoprazole (PROTONIX) EC tablet 40 mg  40 mg Oral Daily Massengill, Harrold Donath, MD   40 mg at 03/04/23 1321   risperiDONE (RISPERDAL) tablet 1 mg  1 mg Oral QHS Massengill, Harrold Donath, MD       Melene Muller ON 03/05/2023] thiamine (Vitamin B-1) tablet 100 mg  100 mg Oral Daily Massengill, Nathan, MD       traZODone (DESYREL) tablet 50 mg  50 mg Oral QHS PRN Sindy Guadeloupe, NP       umeclidinium-vilanterol (ANORO ELLIPTA) 62.5-25 MCG/ACT 1 puff  1 puff Inhalation Daily Otho Bellows, RPH   1 puff at 03/04/23 1319   PTA Medications: Medications Prior to Admission  Medication Sig Dispense Refill Last Dose/Taking   BEVESPI AEROSPHERE 9-4.8 MCG/ACT AERO Inhale 2  puffs into the lungs 2 (two) times daily.   03/01/2023   [facility] ASPIRIN ADULT LOW DOSE 81 MG chewable tablet Chew 81 mg by mouth daily.   Past Month   isosorbide mononitrate (IMDUR) 30 MG 24 hr tablet 30 mg daily.   Past Week   meclizine (ANTIVERT) 25 MG tablet Take 1 tablet (25 mg total) by mouth 3 (three) times daily as needed for dizziness. 10 tablet 0 Past Month   naltrexone (DEPADE) 50 MG tablet Take 1 tablet (50 mg total) by mouth daily. 30 tablet 1 Past Month   omeprazole (PRILOSEC) 20 MG capsule Take 1 capsule (20 mg total) by mouth daily. 30 capsule 0 Past Week   ondansetron (ZOFRAN) 4 MG tablet Take 1 tablet (4 mg total) by mouth every 8 (eight) hours as needed for nausea or vomiting. 20 tablet 0 Past Week   Vitamin D, Ergocalciferol, (DRISDOL) 1.25 MG (50000 UNIT) CAPS capsule Take 50,000 Units by mouth every Friday.   Past Month   ARIPiprazole (ABILIFY) 10 MG tablet TAKE 1 TABLET BY MOUTH DAILY (Patient not taking:  Reported on 03/04/2023) 30 tablet 10 Not Taking   atorvastatin (LIPITOR) 10 MG tablet Take 10 mg by mouth daily. (Patient not taking: Reported on 03/04/2023)   Not Taking   buPROPion (WELLBUTRIN XL) 300 MG 24 hr tablet Take 1 tablet (300 mg total) by mouth daily. (Patient not taking: Reported on 03/04/2023) 30 tablet 0 Not Taking   diltiazem (CARDIZEM CD) 240 MG 24 hr capsule Take 240 mg by mouth daily. (Patient not taking: Reported on 03/04/2023)   Not Taking   losartan (COZAAR) 25 MG tablet  (Patient not taking: Reported on 03/04/2023)   Not Taking    Patient Stressors: Marital or family conflict   Medication change or noncompliance   Substance abuse    Patient Strengths: General fund of knowledge  Motivation for treatment/growth  Supportive family/friends   Treatment Modalities: Medication Management, Group therapy, Case management,  1 to 1 session with clinician, Psychoeducation, Recreational therapy.   Physician Treatment Plan for Primary Diagnosis: Bipolar disorder, mixed (HCC) Long Term Goal(s):     Short Term Goals: Ability to demonstrate self-control will improve Ability to identify and develop effective coping behaviors will improve Ability to identify triggers associated with substance abuse/mental health issues will improve  Medication Management: Evaluate patient's response, side effects, and tolerance of medication regimen.  Therapeutic Interventions: 1 to 1 sessions, Unit Group sessions and Medication administration.  Evaluation of Outcomes: Not Progressing  Physician Treatment Plan for Secondary Diagnosis: Active Problems:   Schizoaffective disorder, depressive type (HCC)   Stimulant use disorder   Alcohol use disorder, severe, dependence (HCC)  Long Term Goal(s):     Short Term Goals: Ability to demonstrate self-control will improve Ability to identify and develop effective coping behaviors will improve Ability to identify triggers associated with substance  abuse/mental health issues will improve     Medication Management: Evaluate patient's response, side effects, and tolerance of medication regimen.  Therapeutic Interventions: 1 to 1 sessions, Unit Group sessions and Medication administration.  Evaluation of Outcomes: Not Progressing   RN Treatment Plan for Primary Diagnosis: Bipolar disorder, mixed (HCC) Long Term Goal(s): Knowledge of disease and therapeutic regimen to maintain health will improve  Short Term Goals: Ability to remain free from injury will improve, Ability to verbalize frustration and anger appropriately will improve, Ability to demonstrate self-control, Ability to participate in decision making will improve, Ability to verbalize feelings will improve,  Ability to disclose and discuss suicidal ideas, Ability to identify and develop effective coping behaviors will improve, and Compliance with prescribed medications will improve  Medication Management: RN will administer medications as ordered by provider, will assess and evaluate patient's response and provide education to patient for prescribed medication. RN will report any adverse and/or side effects to prescribing provider.  Therapeutic Interventions: 1 on 1 counseling sessions, Psychoeducation, Medication administration, Evaluate responses to treatment, Monitor vital signs and CBGs as ordered, Perform/monitor CIWA, COWS, AIMS and Fall Risk screenings as ordered, Perform wound care treatments as ordered.  Evaluation of Outcomes: Not Progressing   LCSW Treatment Plan for Primary Diagnosis: Bipolar disorder, mixed (HCC) Long Term Goal(s): Safe transition to appropriate next level of care at discharge, Engage patient in therapeutic group addressing interpersonal concerns.  Short Term Goals: Engage patient in aftercare planning with referrals and resources, Increase social support, Increase ability to appropriately verbalize feelings, Increase emotional regulation, Facilitate  acceptance of mental health diagnosis and concerns, Facilitate patient progression through stages of change regarding substance use diagnoses and concerns, Identify triggers associated with mental health/substance abuse issues, and Increase skills for wellness and recovery  Therapeutic Interventions: Assess for all discharge needs, 1 to 1 time with Social worker, Explore available resources and support systems, Assess for adequacy in community support network, Educate family and significant other(s) on suicide prevention, Complete Psychosocial Assessment, Interpersonal group therapy.  Evaluation of Outcomes: Not Progressing   Progress in Treatment: Attending groups: No. Participating in groups: No. Taking medication as prescribed: Yes. Toleration medication: Yes. Family/Significant other contact made: Yes, individual(s) contacted:  Raymond Mcclure, daughter (payee) [phone] Patient understands diagnosis: Yes. Discussing patient identified problems/goals with staff: Yes. Medical problems stabilized or resolved: Yes. Denies suicidal/homicidal ideation: Yes. Issues/concerns per patient self-inventory: No.  New problem(s) identified: No  New Short Term/Long Term Goal(s):    medication stabilization, elimination of SI thoughts, development of comprehensive mental wellness plan.   Patient Goals:  "I need help with taking my medications again."  Patient admitted to using cocaine, alcohol and fentanyl and requested to  be referred to a residential treatment facility.   Discharge Plan or Barriers:  Patient recently admitted. CSW will continue to follow and assess for appropriate referrals and possible discharge planning.   Reason for Continuation of Hospitalization: Depression Hallucinations Medication stabilization Suicidal ideation  Estimated Length of Stay:  5 - 7 days  Last 3 [place] Suicide Severity Risk Score: Flowsheet Row [facility]Current[facility] Most recent reading at 03/03/2023 11:45 PM ED from 03/03/2023 in [facility] Most recent reading at 03/03/2023  5:44 PM ED from 02/16/2023 in [facility] at [facility] Most recent reading at 02/16/2023 11:42 AM  C-SSRS RISK CATEGORY High Risk High Risk No Risk       Last PHQ 2/9 Scores:    02/10/2022    2:07 PM 09/09/2021   11:11 AM 08/27/2021    9:59 AM  Depression screen PHQ 2/9  Decreased Interest 0 3 1  Down, Depressed, Hopeless 0 0 0  PHQ - 2 Score 0 3 1  Altered sleeping  3 3  Tired, decreased energy  3 3  Change in appetite  3 3  Feeling bad or failure about yourself   0 0  Trouble concentrating  2 2  Moving slowly or fidgety/restless  1 1  Suicidal thoughts  0 0  PHQ-9 Score  15 13  Difficult doing work/chores  Somewhat difficult Somewhat difficult    Scribe for Treatment Team: Nyra Jabs 03/04/2023 5:43 PM

## 2023-03-04 NOTE — Plan of Care (Signed)
  Problem: Education: Goal: Emotional status will improve Outcome: Progressing Goal: Verbalization of understanding the information provided will improve Outcome: Progressing   Problem: Activity: Goal: Interest or engagement in activities will improve Outcome: Progressing Goal: Sleeping patterns will improve Outcome: Progressing   Problem: Coping: Goal: Ability to verbalize frustrations and anger appropriately will improve Outcome: Progressing
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
## 2023-03-04 NOTE — Tx Team (Signed)
Initial Treatment Plan 03/04/2023 12:10 AM DAYLAN JUHNKE ZOX:[id number]    PATIENT STRESSORS: Marital or family conflict   Medication change or noncompliance   Substance abuse     PATIENT STRENGTHS: General fund of knowledge  Motivation for treatment/growth  Supportive family/friends    PATIENT IDENTIFIED PROBLEMS: Substance Abuse  Family Dynamics (Daughter/Grandchildren)  Caretaker for elderly mom with dementia                 DISCHARGE CRITERIA:  Ability to meet basic life and health needs Adequate post-discharge living arrangements Improved stabilization in mood, thinking, and/or behavior Reduction of life-threatening or endangering symptoms to within safe limits Safe-care adequate arrangements made Verbal commitment to aftercare and medication compliance  PRELIMINARY DISCHARGE PLAN: Outpatient therapy Return to previous living arrangement  PATIENT/FAMILY INVOLVEMENT: This treatment plan has been presented to and reviewed with the patient.  The patient and family have been given the opportunity to ask questions and make suggestions.  Gara Kroner, RN 03/04/2023, 12:10 AM
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
## 2023-03-05 MED ORDER — HYDRALAZINE HCL 25 MG PO TABS
25.0000 mg | ORAL_TABLET | Freq: Once | ORAL | Status: AC | PRN
  Administered 2023-03-05: 25 mg via ORAL
  Filled 2023-03-05: qty 1

## 2023-03-05 MED ORDER — FAMOTIDINE 20 MG PO TABS
20.0000 mg | ORAL_TABLET | Freq: Two times a day (BID) | ORAL | Status: DC
  Administered 2023-03-05 – 2023-03-14 (×18): 20 mg via ORAL
  Filled 2023-03-05: qty 42
  Filled 2023-03-05: qty 28
  Filled 2023-03-05 (×2): qty 1
  Filled 2023-03-05 (×3): qty 42
  Filled 2023-03-05: qty 28
  Filled 2023-03-05 (×2): qty 1
  Filled 2023-03-05: qty 28
  Filled 2023-03-05: qty 1
  Filled 2023-03-05: qty 42
  Filled 2023-03-05 (×3): qty 1
  Filled 2023-03-05: qty 42
  Filled 2023-03-05 (×4): qty 1
  Filled 2023-03-05: qty 28

## 2023-03-05 MED ORDER — RISPERIDONE 2 MG PO TABS
2.0000 mg | ORAL_TABLET | Freq: Every day | ORAL | Status: DC
Start: 2023-03-05 — End: 2023-03-06
  Administered 2023-03-05: 2 mg via ORAL
  Filled 2023-03-05 (×2): qty 1

## 2023-03-05 NOTE — [facility] Group Notes (Signed)
[facility] Group Notes:  (Nursing)  Date:  03/05/2023  Time:  1400  Type of Therapy:  Psychoeducational Skills  Participation Level:  Active  Participation Quality:  Appropriate and Attentive  Affect:  Appropriate  Cognitive:  Alert and Appropriate  Insight:  Appropriate and Good  Engagement in Group:  Engaged and Supportive  Modes of Intervention:  Activity, Discussion, Exploration, Rapport Building, Socialization, and Support  Summary of Progress/Problems:  Raymond Mcclure 03/05/2023, 4:20 PM

## 2023-03-05 NOTE — Plan of Care (Signed)
   Problem: Education: Goal: Emotional status will improve Outcome: Progressing Goal: Mental status will improve Outcome: Progressing   Problem: Activity: Goal: Interest or engagement in activities will improve Outcome: Progressing

## 2023-03-05 NOTE — Group Note (Unsigned)
Date:  03/07/2023 Time:  7:37 AM  Group Topic/Focus:  Emotional Education:   The focus of this group is to discuss what feelings/emotions are, and how they are experienced.    Participation Level:  Active  Raymond Mcclure Swim 03/07/2023, 7:37 AM

## 2023-03-05 NOTE — Progress Notes (Signed)
Pt reports sleeping poorly last night because his abdomen was hurting due to constipation. Pt was administered his PRN milk of magnesia 30 mL at 1942 for constipation, which was not effective. Educated pt about fruit/veggie intake to help increase fiber in his diet and provided pt a pitcher of water to ensure adequate hydration. Pt does report that he has been passing gas. He denies any anxiety or depression tonight. He does report a decrease in his AVH. Sometimes he'll have CAH that tell him to kill himself or to "slap someone to show them who you are." He denies that he has had any urges to act on these AH. VH of shadows and figures sometimes. Pt's blood pressure was elevated tonight and Ene A., NP was notified who ordered a 1 time order of 25 mg of hydralazine po for HTN. It was administered by this Clinical research associate at 2224. Pt's blood pressure upon recheck at 2328 had decreased a little to 148/85 with a pulse of 93. Pt also remains asymptomatic. Ene A., NP was re-notified at 2335 and there are no new orders. Pt denies HI. Active listening, reassurance, and support provided. Every 15 minute safety checks continue. Pt's safety has been maintained.   03/05/23 1942  Psych Admission Type (Psych Patients Only)  Admission Status Voluntary  Psychosocial Assessment  Patient Complaints Sleep disturbance  Eye Contact Fair  Facial Expression Worried  Affect Appropriate to circumstance  Speech Logical/coherent  Interaction Assertive  Motor Activity Slow  Appearance/Hygiene Unremarkable  Behavior Characteristics Cooperative;Appropriate to situation  Mood Depressed;Pleasant  Thought Process  Coherency WDL  Content WDL  Delusions None reported or observed  Perception Hallucinations  Hallucination Auditory;Visual  Judgment Limited  Confusion None  Danger to Self  Current suicidal ideation? Denies  Agreement Not to Harm Self Yes  Description of Agreement verbally contracts for safety  Danger to Others  Danger  to Others None reported or observed

## 2023-03-05 NOTE — Group Note (Signed)
Date:  03/05/2023 Time:  9:43 AM  Group Topic/Focus:  Goals Group:   The focus of this group is to help patients establish daily goals to achieve during treatment and discuss how the patient can incorporate goal setting into their daily lives to aide in recovery. Orientation:   The focus of this group is to educate the patient on the purpose and policies of crisis stabilization and provide a format to answer questions about their admission.  The group details unit policies and expectations of patients while admitted.    Participation Level:  Did Not Attend  Deforest [facility] 03/05/2023, 9:43 AM

## 2023-03-05 NOTE — Plan of Care (Signed)
   Problem: Education: Goal: Knowledge of [facility] information/materials will improve Outcome: Progressing Goal: Emotional status will improve Outcome: Progressing Goal: Mental status will improve Outcome: Progressing Goal: Verbalization of understanding the information provided will improve Outcome: Progressing   Problem: Activity: Goal: Interest or engagement in activities will improve Outcome: Progressing Goal: Sleeping patterns will improve Outcome: Progressing

## 2023-03-05 NOTE — Progress Notes (Addendum)
D. Pt was resting in bed upon initial approach this am. Pt reported poor sleep last night due to stomach pain from being constipated, but has since reported getting some relief.Pt reported hearing voices last night, and  that he thought he saw someone outside his bedroom window walking back and forth. Pt described his appetite as 'good', concentration as 'poor', and energy level as 'low'. Per pt's self inventory, pt rated his depression,hopelessness and anxiety a 7/10/8, respectively.  Pt endorsed passive SI (no plan) on and off, but agreed to contact staff before acting on harmful thoughts. Pt stated, "My mind or the voices in my head keep telling me that I am okay when I know that I am not. Pt's stated goal today is "to work on how to take care of stress by listening and participating". Pt observed in the milieu interacting well with peers. A. Labs and vitals monitored. Pt given and educated on medications. Pt supported emotionally and encouraged to express concerns and ask questions.   R. Pt remains safe with 15 minute checks. Will continue POC.    03/05/23 1100  Psych Admission Type (Psych Patients Only)  Admission Status Voluntary  Psychosocial Assessment  Patient Complaints None  Eye Contact Fair  Facial Expression Animated  Affect Appropriate to circumstance  Speech Logical/coherent  Interaction Assertive  Motor Activity Slow  Appearance/Hygiene Unremarkable  Behavior Characteristics Cooperative;Appropriate to situation  Mood Pleasant  Thought Process  Coherency WDL  Content WDL  Delusions None reported or observed  Perception Hallucinations  Hallucination Auditory  Judgment Limited  Confusion None  Danger to Self  Current suicidal ideation? Denies  Danger to Others  Danger to Others None reported or observed

## 2023-03-05 NOTE — Progress Notes (Signed)
D: Pt alert and oriented. Pt rates depression 4/10 and anxiety 7/10.  Pt denies experiencing any SI/HI, or AVH at this time.   A: Scheduled medications administered to pt, per MD orders. Support and encouragement provided. Frequent verbal contact made. Routine safety checks conducted q15 minutes.   R: No adverse drug reactions noted. Pt verbally contracts for safety at this time. Pt complaint with medications and treatment plan. Pt interacts well with others on the unit. Pt remains safe at this time. Will continue to monitor.

## 2023-03-05 NOTE — Progress Notes (Signed)
[facility] MD Progress Note  03/05/2023 1:20 PM Raymond Mcclure  MRN:  [id number] Subjective:  Raymond Mcclure is a 58 y.o. male  with a past psychiatric history of bipolar 1 disorder. Patient initially arrived to [facility] on 1/30 for suicidal ideation in the setting of command auditory hallucinations and substance use (cocaine, cannabis), and admitted to [facility] on 1/31 for acute safety concerns and stabilization of acute on chronic psychiatric conditions. 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   RN reports: Patient not able to sleep well due to stomach pain however started having bowel movements this a.m.  PRNs: Maalox.  On assessment today patient reports that he did not sleep well last night due to discomfort in his abdomen.  Patient reports he was going back and forth to the bathroom but only started having bowel movements later this a.m.  Patient reports he is also having reflux symptoms and is not sure Protonix is helping.  Patient reports that his mood is a bit dysphoric and he is still having auditory and visual hallucinations.  Patient reports that he does feel the medication has helped as that [facility] is less frequent.  Patient reports that his VH is unchanged and he continues to see shadows throughout the day.  Patient reports that he can recall both being well-controlled and not occurring when he was on medication in the past.  Patient reports that he is not able to understand what his auditory hallucinations are saying.  Patient is in agreement to titrate up on his Risperdal.  Patient denies SI and HI today.  Patient reports that he is still having some paranoia although he is able to reality test.  Patient reports he prefers to have his back against the wall as it helps him feel more safe in regards to his  paranoia that someone is out to get him however, he questions that it does not make sense for someone to be out to get him and overall he feels safe on the unit.  Patient reports that his appetite is stable despite having stomach pain.   Principal Problem: Bipolar disorder, mixed (HCC) Diagnosis: Active Problems:   Schizoaffective disorder, depressive type (HCC)   Stimulant use disorder   Alcohol use disorder, severe, dependence (HCC)  Total Time spent with patient: 20 minutes  Past Psychiatric History: Current psychiatrist: None Current therapist: denied. Previous psychiatric diagnoses: Schizoaffective disorder, bipolar type Current psychiatric medications: Patient appears to be taking Wellbutrin and naltrexone, has been off medications for a 2 to 3 weeks (later clarified, within a few months) Psychiatric medication history/compliance: Abilify, naltrexone, Wellbutrin Psychiatric hospitalization(s): Previously hospitalized 04/2021 at [facility] for suicide attempt (attempting to jump off a parking deck) and substance use. Psychotherapy history: none. Neuromodulation history: none. History of suicide (obtained from HPI): 4 previous suicide attempts, last 2 years ago (OD on fentanyl) requiring hospitalization History of homicide or aggression (obtained in HPI): Per chart review, patient had thoughts of "choking out my grandson" who was bothering him.  Reported irritability and agitation in caring for his demented mother.  Per public offender registry, patient was imprisoned from 1999-2005.  Past Medical History:  Past Medical History:  Diagnosis Date   Cluster headaches    Drug abuse (HCC)    Hypertension    Migraines    Obesity    Tachycardia  Past Surgical History:  Procedure Laterality Date   CARDIAC CATHETERIZATION     LOOP RECORDER IMPLANT     WRIST SURGERY     nerve repair   Family History: History reviewed. No pertinent family history. Family Psychiatric  History:  Psychiatric diagnoses: Daughter has bipolar disorder.  Cousin has a history of schizophrenia. Suicide history: denied.  Violence/aggression:  Says entire family is violent towards each other. Social History:  Social History   Substance and Sexual Activity  Alcohol Use Yes   Comment: 1 Gallon/Day     Social History   Substance and Sexual Activity  Drug Use Yes   Types: "Crack" cocaine, Fentanyl, Cocaine, Marijuana   Comment: Daily Use    Social History   Socioeconomic History   Marital status: Legally Separated    Spouse name: Not on file   Number of children: Not on file   Years of education: Not on file   Highest education level: Not on file  Occupational History   Not on file  Tobacco Use   Smoking status: Some Days    Types: Cigars   Smokeless tobacco: Never   Tobacco comments:    Couple of days .  Smokes when stressed.  Vaping Use   Vaping status: Every Day  Substance and Sexual Activity   Alcohol use: Yes    Comment: 1 Gallon/Day   Drug use: Yes    Types: "Crack" cocaine, Fentanyl, Cocaine, Marijuana    Comment: Daily Use   Sexual activity: Not Currently    Comment: crack  Other Topics Concern   Not on file  Social History Narrative   Not on file   Social Drivers of Health   Financial Resource Strain: Low Risk  (02/10/2022)   Overall Financial Resource Strain (CARDIA)    Difficulty of Paying Living Expenses: Not hard at all  Food Insecurity: Food Insecurity Present (03/03/2023)   Hunger Vital Sign    Worried About Programme researcher, broadcasting/film/video in the Last Year: Often true    Ran Out of Food in the Last Year: Often true  Transportation Needs: Unmet Transportation Needs (03/03/2023)   PRAPARE - Administrator, Civil Service (Medical): Yes    Lack of Transportation (Non-Medical): Yes  Physical Activity: Not on file  Stress: No Stress Concern Present (02/10/2022)   [employer] of Occupational Health - Occupational Stress Questionnaire    Feeling of  Stress : Not at all  Social Connections: Not on file   Additional Social History:                         Sleep: Poor  Appetite:  Good  Current Medications: Current Facility-Administered Medications  Medication Dose Route Frequency Provider Last Rate Last Admin   acetaminophen (TYLENOL) tablet 650 mg  650 mg Oral Q6H PRN Sindy Guadeloupe, NP       alum & mag hydroxide-simeth (MAALOX/MYLANTA) 200-200-20 MG/5ML suspension 30 mL  30 mL Oral Q4H PRN Sindy Guadeloupe, NP   30 mL at 03/04/23 1948   aspirin chewable tablet 81 mg  81 mg Oral Daily Massengill, Nathan, MD   81 mg at 03/05/23 0755   atorvastatin (LIPITOR) tablet 10 mg  10 mg Oral Daily Massengill, Harrold Donath, MD   10 mg at 03/05/23 0806   diltiazem (CARDIZEM CD) 24 hr capsule 240 mg  240 mg Oral Daily Massengill, Harrold Donath, MD   240 mg at 03/05/23 0756   famotidine (PEPCID) tablet  20 mg  20 mg Oral BID Eliseo Gum B, MD       hydrOXYzine (ATARAX) tablet 25 mg  25 mg Oral TID PRN Sindy Guadeloupe, NP       isosorbide mononitrate (IMDUR) 24 hr tablet 30 mg  30 mg Oral Daily Massengill, Harrold Donath, MD   30 mg at 03/05/23 0454   loperamide (IMODIUM) capsule 2-4 mg  2-4 mg Oral PRN Massengill, Harrold Donath, MD       LORazepam (ATIVAN) tablet 1 mg  1 mg Oral Q6H PRN Massengill, Harrold Donath, MD       losartan (COZAAR) tablet 50 mg  50 mg Oral Daily Massengill, Harrold Donath, MD   50 mg at 03/05/23 0981   magnesium hydroxide (MILK OF MAGNESIA) suspension 30 mL  30 mL Oral Daily PRN Sindy Guadeloupe, NP       meclizine (ANTIVERT) tablet 25 mg  25 mg Oral TID PRN Phineas Inches, MD       multivitamin with minerals tablet 1 tablet  1 tablet Oral Daily Massengill, Harrold Donath, MD   1 tablet at 03/05/23 0801   nicotine (NICODERM CQ - dosed in mg/24 hours) patch 14 mg  14 mg Transdermal Daily Sindy Guadeloupe, NP   14 mg at 03/05/23 0805   OLANZapine (ZYPREXA) injection 10 mg  10 mg Intramuscular TID PRN Sindy Guadeloupe, NP       OLANZapine (ZYPREXA) injection 5 mg  5 mg  Intramuscular TID PRN Sindy Guadeloupe, NP       OLANZapine zydis (ZYPREXA) disintegrating tablet 5 mg  5 mg Oral TID PRN Sindy Guadeloupe, NP       ondansetron (ZOFRAN-ODT) disintegrating tablet 4 mg  4 mg Oral Q6H PRN Massengill, Harrold Donath, MD       risperiDONE (RISPERDAL) tablet 2 mg  2 mg Oral QHS Eliseo Gum B, MD       thiamine (Vitamin B-1) tablet 100 mg  100 mg Oral Daily Massengill, Harrold Donath, MD   100 mg at 03/05/23 0755   traZODone (DESYREL) tablet 50 mg  50 mg Oral QHS PRN Sindy Guadeloupe, NP       umeclidinium-vilanterol (ANORO ELLIPTA) 62.5-25 MCG/ACT 1 puff  1 puff Inhalation Daily Otho Bellows, RPH   1 puff at 03/05/23 0757    Lab Results:  Results for orders placed or performed during the hospital encounter of 03/03/23 (from the past 48 hours)  CBC with Differential/Platelet     Status: Abnormal   Collection Time: 03/03/23  4:40 PM  Result Value Ref Range   WBC 12.2 (H) 4.0 - 10.5 K/uL   RBC 4.65 4.22 - 5.81 MIL/uL   Hemoglobin 14.8 13.0 - 17.0 g/dL   HCT 19.1 47.8 - 29.5 %   MCV 93.5 80.0 - 100.0 fL   MCH 31.8 26.0 - 34.0 pg   MCHC 34.0 30.0 - 36.0 g/dL   RDW 62.1 30.8 - 65.7 %   Platelets 198 150 - 400 K/uL   nRBC 0.0 0.0 - 0.2 %   Neutrophils Relative % 67 %   Neutro Abs 8.2 (H) 1.7 - 7.7 K/uL   Lymphocytes Relative 24 %   Lymphs Abs 2.9 0.7 - 4.0 K/uL   Monocytes Relative 8 %   Monocytes Absolute 1.0 0.1 - 1.0 K/uL   Eosinophils Relative 1 %   Eosinophils Absolute 0.1 0.0 - 0.5 K/uL   Basophils Relative 0 %   Basophils Absolute 0.0 0.0 - 0.1 K/uL   Immature Granulocytes 0 %   Abs  Immature Granulocytes 0.04 0.00 - 0.07 K/uL    Comment: Performed at [facility], [address], [place], [place] [address]  Comprehensive metabolic panel     Status: None   Collection Time: 03/03/23  4:40 PM  Result Value Ref Range   Sodium 139 135 - 145 mmol/L   Potassium 4.1 3.5 - 5.1 mmol/L   Chloride 104 98 - 111 mmol/L   CO2 22 22 - 32 mmol/L   Glucose, Bld 87 70 - 99  mg/dL    Comment: Glucose reference range applies only to samples taken after fasting for at least 8 hours.   BUN 11 6 - 20 mg/dL   Creatinine, Ser 6.04 0.61 - 1.24 mg/dL   Calcium 9.8 8.9 - 54.0 mg/dL   Total Protein 7.2 6.5 - 8.1 g/dL   Albumin 4.2 3.5 - 5.0 g/dL   AST 27 15 - 41 U/L   ALT 21 0 - 44 U/L   Alkaline Phosphatase 93 38 - 126 U/L   Total Bilirubin 1.2 0.0 - 1.2 mg/dL   GFR, Estimated >98 >11 mL/min    Comment: (NOTE) Calculated using the CKD-EPI Creatinine Equation (2021)    Anion gap 13 5 - 15    Comment: Performed at [facility], [address], [place], [place] [address]  Hemoglobin A1c     Status: Abnormal   Collection Time: 03/03/23  4:40 PM  Result Value Ref Range   Hgb A1c MFr Bld 5.7 (H) 4.8 - 5.6 %    Comment: (NOTE) Pre diabetes:          5.7%-6.4%  Diabetes:              >6.4%  Glycemic control for   <7.0% adults with diabetes    Mean Plasma Glucose 116.89 mg/dL    Comment: Performed at [facility], [address], [place], [place] [address]  Ethanol     Status: None   Collection Time: 03/03/23  4:40 PM  Result Value Ref Range   Alcohol, Ethyl (B) <10 <10 mg/dL    Comment: (NOTE) Lowest detectable limit for serum alcohol is 10 mg/dL.  For medical purposes only. Performed at [facility], [address] [place], [place] [address]   Lipid panel     Status: Abnormal   Collection Time: 03/03/23  4:40 PM  Result Value Ref Range   Cholesterol 159 0 - 200 mg/dL   Triglycerides 65 <578 mg/dL   HDL 39 (L) >46 mg/dL   Total CHOL/HDL Ratio 4.1 RATIO   VLDL 13 0 - 40 mg/dL   LDL Cholesterol 962 (H) 0 - 99 mg/dL    Comment:        Total Cholesterol/HDL:CHD Risk Coronary Heart Disease Risk Table                     Men   Women  1/2 Average Risk   3.4   3.3  Average Risk       5.0   4.4  2 X Average Risk   9.6   7.1  3 X Average Risk  23.4   11.0        Use the calculated Patient Ratio above and the CHD Risk Table to  determine the patient's CHD Risk.        ATP III CLASSIFICATION (LDL):  <100     mg/dL   Optimal  952-841  mg/dL  Near or Above                    Optimal  130-159  mg/dL   Borderline  161-096  mg/dL   High  >045     mg/dL   Very High Performed at [facility], [address], [place], [place] [address]   TSH     Status: None   Collection Time: 03/03/23  4:40 PM  Result Value Ref Range   TSH 0.697 0.350 - 4.500 uIU/mL    Comment: Performed by a 3rd Generation assay with a functional sensitivity of <=0.01 uIU/mL. Performed at [facility], [address], [place], [place] [address]   POCT Urine Drug Screen - (I-Screen)     Status: Abnormal   Collection Time: 03/03/23  4:54 PM  Result Value Ref Range   POC Amphetamine UR None Detected NONE DETECTED (Cut Off Level 1000 ng/mL)   POC Secobarbital (BAR) None Detected NONE DETECTED (Cut Off Level 300 ng/mL)   POC Buprenorphine (BUP) None Detected NONE DETECTED (Cut Off Level 10 ng/mL)   POC Oxazepam (BZO) None Detected NONE DETECTED (Cut Off Level 300 ng/mL)   POC Cocaine UR Positive (A) NONE DETECTED (Cut Off Level 300 ng/mL)   POC Methamphetamine UR None Detected NONE DETECTED (Cut Off Level 1000 ng/mL)   POC Morphine None Detected NONE DETECTED (Cut Off Level 300 ng/mL)   POC Methadone UR None Detected NONE DETECTED (Cut Off Level 300 ng/mL)   POC Oxycodone UR None Detected NONE DETECTED (Cut Off Level 100 ng/mL)   POC Marijuana UR Positive (A) NONE DETECTED (Cut Off Level 50 ng/mL)    Blood Alcohol level:  Lab Results  Component Value Date   ETH <10 03/03/2023   ETH <10 02/16/2023    Metabolic Disorder Labs: Lab Results  Component Value Date   HGBA1C 5.7 (H) 03/03/2023   MPG 116.89 03/03/2023   No results found for: "PROLACTIN" Lab Results  Component Value Date   CHOL 159 03/03/2023   TRIG 65 03/03/2023   HDL 39 (L) 03/03/2023   CHOLHDL 4.1 03/03/2023   VLDL 13 03/03/2023   LDLCALC 107 (H) 03/03/2023    LDLCALC (H) 07/15/2006    102        Total Cholesterol/HDL:CHD Risk Coronary Heart Disease Risk Table                     Men   Women  1/2 Average Risk   3.4   3.3    Physical Findings: AIMS:  , ,  ,  ,    CIWA:  CIWA-Ar Total: 0 COWS:     Musculoskeletal: Strength & Muscle Tone: within normal limits Gait & Station: normal Patient leans: N/A  Psychiatric Specialty Exam:  Presentation  General Appearance:  Casual  Eye Contact: Good  Speech: Clear and Coherent  Speech Volume: Normal  Handedness: Right   Mood and Affect  Mood: Dysphoric  Affect: Congruent; Depressed   Thought Process  Thought Processes: Coherent  Descriptions of Associations:Intact  Orientation:Full (Time, Place and Person)  Thought Content:Logical  History of Schizophrenia/Schizoaffective disorder:No data recorded Duration of Psychotic Symptoms:Greater than six months  Hallucinations:Hallucinations: Auditory; Visual Description of Auditory Hallucinations: See shadows, figures Description of Visual Hallucinations: chronic voices making sounds, calling his name, more recently telling him to kill himself  Ideas of Reference:Paranoia  Suicidal Thoughts:Suicidal Thoughts: No SI Active Intent and/or Plan: With Plan  Homicidal Thoughts:Homicidal  Thoughts: No HI Passive Intent and/or Plan: Without Intent   Sensorium  Memory: Immediate Good; Recent Good  Judgment: Fair  Insight: Fair   Executive Functions  Concentration: Good  Attention Span: Good  Recall: Dudley Major of Knowledge: Fair  Language: Good   Psychomotor Activity  Psychomotor Activity: Psychomotor Activity: Normal   Assets  Assets: Desire for Improvement; Resilience   Sleep  Sleep: Sleep: Poor Number of Hours of Sleep: 7.75    Physical Exam: Physical Exam Constitutional:      Appearance: Normal appearance.  HENT:     Head: Normocephalic and atraumatic.  Pulmonary:     Effort:  Pulmonary effort is normal.  Neurological:     Mental Status: He is alert and oriented to person, place, and time.    Review of Systems  Gastrointestinal:  Positive for abdominal pain, diarrhea and heartburn.  Psychiatric/Behavioral:  Positive for depression and hallucinations. Negative for suicidal ideas. The patient has insomnia.    Blood pressure (!) 141/71, pulse 89, temperature 97.9 F (36.6 C), temperature source Oral, resp. rate 20, height 5\' 10"  (1.778 m), weight 113.9 kg, SpO2 98%. Body mass index is 36.01 kg/m.   Treatment Plan Summary: Daily contact with patient to assess and evaluate symptoms and progress in treatment and Medication management  Based on assessment today patient appears to be responding positively to Risperdal, we will continue upward titration due to continued symptoms.  Do not believe stomach side effects are secondary to medication as patient was constipated prior to starting medication.  Patient also has a history of acid reflux not responding well to Protonix.  ASSESSMENT:    Diagnoses / Active Problems: Schizoaffective disorder, bipolar type, in depressive episode Stimulant use disorder, cocaine type Cannabis use disorder PTSD    PLAN:   Safety and Monitoring: -  VOLUNTARY  admission to inpatient psychiatric unit for safety, stabilization and treatment. - Daily contact with patient to assess and evaluate symptoms and progress in treatment - Patient's case to be discussed in multi-disciplinary team meeting -  Observation Level : q15 minute checks -  Vital signs:  q12 hours -  Precautions: suicide, elopement, and assault   2. Psychiatric Diagnoses and Treatment:     # Schizoaffective disorder, bipolar type, in depressive episode # PTSD - Increase Risperdal to 2 mg nightly for psychosis and mood stabilization. - The risks/benefits/side-effects/alternatives to this medication were discussed in detail with the patient and time was given for  questions. The patient consents to medication trial.  - Metabolic profile and EKG monitoring obtained while on an atypical antipsychotic  BMI: 36.01 TSH: 0.697 Lipid panel: LDL 107 HbgA1c: 5.7 QTc: 430 - Encouraged patient to participate in unit milieu and in scheduled group therapies  - Short Term Goals: Ability to demonstrate self-control will improve, Ability to identify and develop effective coping behaviors will improve, and Ability to identify triggers associated with substance abuse/mental health issues will improve - Long Term Goals: Improvement in symptoms so as ready for discharge   Other PRNS:    Other labs reviewed on admission: WBC 12.2 H. CMP WNL.  UDS positive for cocaine and marijuana.               3. Medical Issues Being Addressed:    # Tobacco Use Disorder  - Nicotine patch 21mg /24 hours ordered  - Smoking cessation encouraged.   # Hypertension - Restarting home meds including diltiazem 240, Imdur 30, losartan 50 mg   #Hyperlipidemia - Restart Lipitor  10 mg   #Arthrosclerosis #Left vertebral artery, carotid artery stenosis - Restart home aspirin 81 mg   #Obstructive sleep apnea -Encouraged patient to retrieve CPAP from [facility] when able  GERD - Discontinue Protonix 40 mg daily - Start famotidine 20 mg twice daily   4. Discharge Planning:    - Estimated discharge date: 5 to 7 days - Social work and case management to assist with discharge planning and identification of hospital follow-up needs prior to discharge. - Discharge concerns: Need to establish a safety plan; medication compliance and effectiveness. - Discharge goals: Patient reports interest in 30 to 45-day substance use treatment program.  Referrals will be faxed to [facility] and [facility] on Monday 2/3.   I certify that inpatient services furnished can reasonably be expected to improve the patient's condition.   Bobbye Morton, MD 03/05/2023, 1:20 PM

## 2023-03-05 NOTE — [facility] Group Notes (Signed)
[facility] Group Notes:  (Nursing/MHT/Case Management/Adjunct)  Date:  03/05/2023  Time:  9:35 PM  Type of Therapy:   Wrap-up group  Participation Level:  Active  Participation Quality:  Appropriate  Affect:  Appropriate  Cognitive:  Appropriate  Insight:  Appropriate  Engagement in Group:  Engaged  Modes of Intervention:  Education  Summary of Progress/Problems: Pt goal to listen more. Rated day 6/10.  Noah Delaine 03/05/2023, 9:35 PM

## 2023-03-06 MED ORDER — POLYETHYLENE GLYCOL 3350 17 G PO PACK
17.0000 g | PACK | Freq: Every day | ORAL | Status: DC
  Administered 2023-03-06 – 2023-03-14 (×6): 17 g via ORAL
  Filled 2023-03-06 (×3): qty 1
  Filled 2023-03-06: qty 7
  Filled 2023-03-06 (×8): qty 1

## 2023-03-06 MED ORDER — RISPERIDONE 3 MG PO TABS
3.0000 mg | ORAL_TABLET | Freq: Every day | ORAL | Status: DC
  Administered 2023-03-06 – 2023-03-13 (×8): 3 mg via ORAL
  Filled 2023-03-06: qty 21
  Filled 2023-03-06 (×6): qty 1
  Filled 2023-03-06: qty 21
  Filled 2023-03-06 (×4): qty 1

## 2023-03-06 NOTE — [facility] Group Notes (Signed)
Pt did not attend goals group. 

## 2023-03-06 NOTE — Progress Notes (Signed)
[facility] MD Progress Note  03/06/2023 2:04 PM Raymond Mcclure  MRN:  [id number] Subjective:  Raymond Mcclure is a 58 y.o. male  with a past psychiatric history of bipolar 1 disorder. Patient initially arrived to [facility] on 1/30 for suicidal ideation in the setting of command auditory hallucinations and substance use (cocaine, cannabis), and admitted to [facility] on 1/31 for acute safety concerns and stabilization of acute on chronic psychiatric conditions. 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   RN reports: Patient not able to sleep well due to stomach pain he continues to have bowel movements PRNs: Milk of magnesia, Tylenol, and hydroxyzine  On assessment today patient reports he still not sleeping very well due to abdominal pain however, he reports that the pain is less and he is having better bowel movements.  Patient reports that he does feel that his auditory hallucinations have improved as he is now only hearing an occasional person say his name in the room.  Patient reports that he still hears voices inside his head but then later clarifies that he thinks this is now his conscience as though things are more positive and encouraging, and feeling his own voice.  Patient reports he is still having VH endorsing that he is seeing shadows and figures walk past him or in front of him however he is less bothered by these.  Patient endorses some improvement in his paranoia and upon further discussion it appears this may be more hypervigilance related to trauma history.  Patient reports that he gets nervous when people are behind him and is a bit jumpy, and he will often look over his shoulder.  Patient reports that he had similar behaviors when he was in prison and reports that he has done things in his past that may  contribute to why he feels like someone may be out to get him.  Patient reports that overall he feels fairly safe on the unit.  Patient reports that he still has some irritability during the day but is trying to go to group.  Patient reports he also has a significant amount of anxiety rating it as a 7/10, with 10 being the worst.  Patient reports that he is worried about getting his bills paid and decided to contact his ex-wife who he initially did not want to know that he was hospitalized.  Patient reports he asked her for assistance to lock into his account and uses prepay card.  Patient denies SI and HI and endorses that he does feel that he is getting better overall.  Principal Problem: Bipolar disorder, mixed (HCC) Diagnosis: Active Problems:   Schizoaffective disorder, depressive type (HCC)   Stimulant use disorder   Alcohol use disorder, severe, dependence (HCC)  Total Time spent with patient: 20 minutes  Past Psychiatric History: Current psychiatrist: None Current therapist: denied. Previous psychiatric diagnoses: Schizoaffective disorder, bipolar type Current psychiatric medications: Patient appears to be taking Wellbutrin and naltrexone, has been off medications for a 2 to 3 weeks (later clarified, within a few months) Psychiatric medication history/compliance: Abilify, naltrexone, Wellbutrin Psychiatric hospitalization(s): Previously hospitalized 04/2021 at [facility] for suicide attempt (attempting to jump off a parking deck) and substance use. Psychotherapy history: none. Neuromodulation history: none. History of suicide (obtained from HPI): 4 previous suicide attempts, last 2 years ago (OD on fentanyl) requiring hospitalization History of homicide  or aggression (obtained in HPI): Per chart review, patient had thoughts of "choking out my grandson" who was bothering him.  Reported irritability and agitation in caring for his demented mother.  Per public offender registry, patient was  imprisoned from 1999-2005.  Past Medical History:  Past Medical History:  Diagnosis Date   Cluster headaches    Drug abuse (HCC)    Hypertension    Migraines    Obesity    Tachycardia     Past Surgical History:  Procedure Laterality Date   CARDIAC CATHETERIZATION     LOOP RECORDER IMPLANT     WRIST SURGERY     nerve repair   Family History: History reviewed. No pertinent family history. Family Psychiatric  History: Psychiatric diagnoses: Daughter has bipolar disorder.  Cousin has a history of schizophrenia. Suicide history: denied.  Violence/aggression:  Says entire family is violent towards each other. Social History:  Social History   Substance and Sexual Activity  Alcohol Use Yes   Comment: 1 Gallon/Day     Social History   Substance and Sexual Activity  Drug Use Yes   Types: "Crack" cocaine, Fentanyl, Cocaine, Marijuana   Comment: Daily Use    Social History   Socioeconomic History   Marital status: Legally Separated    Spouse name: Not on file   Number of children: Not on file   Years of education: Not on file   Highest education level: Not on file  Occupational History   Not on file  Tobacco Use   Smoking status: Some Days    Types: Cigars   Smokeless tobacco: Never   Tobacco comments:    Couple of days .  Smokes when stressed.  Vaping Use   Vaping status: Every Day  Substance and Sexual Activity   Alcohol use: Yes    Comment: 1 Gallon/Day   Drug use: Yes    Types: "Crack" cocaine, Fentanyl, Cocaine, Marijuana    Comment: Daily Use   Sexual activity: Not Currently    Comment: crack  Other Topics Concern   Not on file  Social History Narrative   Not on file   Social Drivers of Health   Financial Resource Strain: Low Risk  (02/10/2022)   Overall Financial Resource Strain (CARDIA)    Difficulty of Paying Living Expenses: Not hard at all  Food Insecurity: Food Insecurity Present (03/03/2023)   Hunger Vital Sign    Worried About Brewing technologist in the Last Year: Often true    Ran Out of Food in the Last Year: Often true  Transportation Needs: Unmet Transportation Needs (03/03/2023)   PRAPARE - Administrator, Civil Service (Medical): Yes    Lack of Transportation (Non-Medical): Yes  Physical Activity: Not on file  Stress: No Stress Concern Present (02/10/2022)   [employer] of Occupational Health - Occupational Stress Questionnaire    Feeling of Stress : Not at all  Social Connections: Not on file   Additional Social History:                         Sleep: Poor  Appetite:  Good  Current Medications: Current Facility-Administered Medications  Medication Dose Route Frequency Provider Last Rate Last Admin   acetaminophen (TYLENOL) tablet 650 mg  650 mg Oral Q6H PRN Sindy Guadeloupe, NP   650 mg at 03/05/23 2226   alum & mag hydroxide-simeth (MAALOX/MYLANTA) 200-200-20 MG/5ML suspension 30 mL  30 mL Oral Q4H  PRN Sindy Guadeloupe, NP   30 mL at 03/04/23 1948   aspirin chewable tablet 81 mg  81 mg Oral Daily Massengill, Harrold Donath, MD   81 mg at 03/06/23 1610   atorvastatin (LIPITOR) tablet 10 mg  10 mg Oral Daily Massengill, Harrold Donath, MD   10 mg at 03/06/23 9604   diltiazem (CARDIZEM CD) 24 hr capsule 240 mg  240 mg Oral Daily Massengill, Harrold Donath, MD   240 mg at 03/06/23 5409   famotidine (PEPCID) tablet 20 mg  20 mg Oral BID Eliseo Gum B, MD   20 mg at 03/06/23 8119   hydrOXYzine (ATARAX) tablet 25 mg  25 mg Oral TID PRN Sindy Guadeloupe, NP       isosorbide mononitrate (IMDUR) 24 hr tablet 30 mg  30 mg Oral Daily Massengill, Harrold Donath, MD   30 mg at 03/06/23 1478   loperamide (IMODIUM) capsule 2-4 mg  2-4 mg Oral PRN Massengill, Harrold Donath, MD       LORazepam (ATIVAN) tablet 1 mg  1 mg Oral Q6H PRN Massengill, Harrold Donath, MD       losartan (COZAAR) tablet 50 mg  50 mg Oral Daily Massengill, Harrold Donath, MD   50 mg at 03/06/23 2956   magnesium hydroxide (MILK OF MAGNESIA) suspension 30 mL  30 mL Oral Daily PRN Sindy Guadeloupe,  NP   30 mL at 03/05/23 1942   meclizine (ANTIVERT) tablet 25 mg  25 mg Oral TID PRN Phineas Inches, MD       multivitamin with minerals tablet 1 tablet  1 tablet Oral Daily Massengill, Harrold Donath, MD   1 tablet at 03/06/23 2130   nicotine (NICODERM CQ - dosed in mg/24 hours) patch 14 mg  14 mg Transdermal Daily Sindy Guadeloupe, NP   14 mg at 03/06/23 0820   OLANZapine (ZYPREXA) injection 10 mg  10 mg Intramuscular TID PRN Sindy Guadeloupe, NP       OLANZapine (ZYPREXA) injection 5 mg  5 mg Intramuscular TID PRN Sindy Guadeloupe, NP       OLANZapine zydis (ZYPREXA) disintegrating tablet 5 mg  5 mg Oral TID PRN Sindy Guadeloupe, NP       ondansetron (ZOFRAN-ODT) disintegrating tablet 4 mg  4 mg Oral Q6H PRN Massengill, Nathan, MD       polyethylene glycol (MIRALAX / GLYCOLAX) packet 17 g  17 g Oral Daily Eliseo Gum B, MD       risperiDONE (RISPERDAL) tablet 3 mg  3 mg Oral QHS Eliseo Gum B, MD       thiamine (Vitamin B-1) tablet 100 mg  100 mg Oral Daily Massengill, Nathan, MD   100 mg at 03/06/23 8657   traZODone (DESYREL) tablet 50 mg  50 mg Oral QHS PRN Sindy Guadeloupe, NP   50 mg at 03/05/23 2129   umeclidinium-vilanterol (ANORO ELLIPTA) 62.5-25 MCG/ACT 1 puff  1 puff Inhalation Daily Otho Bellows, RPH   1 puff at 03/06/23 8469    Lab Results:  No results found for this or any previous visit (from the past 48 hours).   Blood Alcohol level:  Lab Results  Component Value Date   [facility] <10 03/03/2023   ETH <10 02/16/2023    Metabolic Disorder Labs: Lab Results  Component Value Date   HGBA1C 5.7 (H) 03/03/2023   MPG 116.89 03/03/2023   No results found for: "PROLACTIN" Lab Results  Component Value Date   CHOL 159 03/03/2023   TRIG 65 03/03/2023   HDL 39 (L) 03/03/2023  CHOLHDL 4.1 03/03/2023   VLDL 13 03/03/2023   LDLCALC 107 (H) 03/03/2023   LDLCALC (H) 07/15/2006    102        Total Cholesterol/HDL:CHD Risk Coronary Heart Disease Risk Table                     Men   Women  1/2  Average Risk   3.4   3.3    Physical Findings: AIMS:  , ,  ,  ,    CIWA:  CIWA-Ar Total: 1 COWS:     Musculoskeletal: Strength & Muscle Tone: within normal limits Gait & Station: normal Patient leans: N/A  Psychiatric Specialty Exam:  Presentation  General Appearance:  Appropriate for Environment  Eye Contact: Good  Speech: Clear and Coherent  Speech Volume: Normal  Handedness: Right   Mood and Affect  Mood: Euthymic  Affect: Appropriate   Thought Process  Thought Processes: Coherent  Descriptions of Associations:Intact  Orientation:Full (Time, Place and Person)  Thought Content:Logical  History of Schizophrenia/Schizoaffective disorder:No data recorded Duration of Psychotic Symptoms:Greater than six months  Hallucinations:Hallucinations: Auditory; Visual  Ideas of Reference:Paranoia  Suicidal Thoughts:Suicidal Thoughts: No  Homicidal Thoughts:Homicidal Thoughts: No   Sensorium  Memory: Immediate Good; Recent Good  Judgment: Fair  Insight: Fair   Art therapist  Concentration: Good  Attention Span: Good  Recall: Good  Fund of Knowledge: Fair  Language: Good   Psychomotor Activity  Psychomotor Activity: Psychomotor Activity: Normal   Assets  Assets: Communication Skills; Desire for Improvement; Resilience   Sleep  Sleep: Sleep: Fair Number of Hours of Sleep: 9.75    Physical Exam: Physical Exam Constitutional:      Appearance: Normal appearance.  HENT:     Head: Normocephalic and atraumatic.  Pulmonary:     Effort: Pulmonary effort is normal.  Neurological:     Mental Status: He is alert and oriented to person, place, and time.    Review of Systems  Gastrointestinal:  Positive for abdominal pain, diarrhea and heartburn.  Psychiatric/Behavioral:  Positive for depression and hallucinations. Negative for suicidal ideas. The patient has insomnia.    Blood pressure (!) 140/95, pulse 89,  temperature 97.8 F (36.6 C), temperature source Oral, resp. rate 16, height 5\' 10"  (1.778 m), weight 113.9 kg, SpO2 100%. Body mass index is 36.01 kg/m.   Treatment Plan Summary: Daily contact with patient to assess and evaluate symptoms and progress in treatment and Medication management  We will increase patient's nighttime Risperdal to address continued but improving AVH as well as her reported irritability.  Patient may also sleep a bit better with increased dose and decreased abdominal issues.  We will also prescribe MiraLAX as patient endorses history of success in treatment constipation with this.  ASSESSMENT:    Diagnoses / Active Problems: Schizoaffective disorder, bipolar type, in depressive episode Stimulant use disorder, cocaine type Cannabis use disorder PTSD    PLAN:   Safety and Monitoring: -  VOLUNTARY  admission to inpatient psychiatric unit for safety, stabilization and treatment. - Daily contact with patient to assess and evaluate symptoms and progress in treatment - Patient's case to be discussed in multi-disciplinary team meeting -  Observation Level : q15 minute checks -  Vital signs:  q12 hours -  Precautions: suicide, elopement, and assault   2. Psychiatric Diagnoses and Treatment:     # Schizoaffective disorder, bipolar type, in depressive episode # PTSD - Increase Risperdal to 3 mg nightly for psychosis and mood  stabilization. - The risks/benefits/side-effects/alternatives to this medication were discussed in detail with the patient and time was given for questions. The patient consents to medication trial.  - Metabolic profile and EKG monitoring obtained while on an atypical antipsychotic  BMI: 36.01 TSH: 0.697 Lipid panel: LDL 107 HbgA1c: 5.7 QTc: 430 - Encouraged patient to participate in unit milieu and in scheduled group therapies  - Short Term Goals: Ability to demonstrate self-control will improve, Ability to identify and develop effective  coping behaviors will improve, and Ability to identify triggers associated with substance abuse/mental health issues will improve - Long Term Goals: Improvement in symptoms so as ready for discharge   Other PRNS:    Other labs reviewed on admission: WBC 12.2 H. CMP WNL.  UDS positive for cocaine and marijuana.               3. Medical Issues Being Addressed:    # Tobacco Use Disorder  - Nicotine patch 21mg /24 hours ordered  - Smoking cessation encouraged.   # Hypertension - Restarting home meds including diltiazem 240, Imdur 30, losartan 50 mg   #Hyperlipidemia - Restart Lipitor 10 mg   #Arthrosclerosis #Left vertebral artery, carotid artery stenosis - Restart home aspirin 81 mg   #Obstructive sleep apnea -Encouraged patient to retrieve CPAP from [facility] when able  GERD - Continue famotidine 20 mg twice daily   4. Discharge Planning:    - Estimated discharge date: 5 to 7 days - Social work and case management to assist with discharge planning and identification of hospital follow-up needs prior to discharge. - Discharge concerns: Need to establish a safety plan; medication compliance and effectiveness. - Discharge goals: Patient reports interest in 30 to 45-day substance use treatment program.  Referrals will be faxed to [facility] and [facility] on Monday 2/3.   I certify that inpatient services furnished can reasonably be expected to improve the patient's condition.   Bobbye Morton, MD 03/06/2023, 2:04 PM

## 2023-03-06 NOTE — Group Note (Signed)
Date:  03/06/2023 Time:  9:18 PM  Group Topic/Focus:  Wrap-Up Group:   The focus of this group is to help patients review their daily goal of treatment and discuss progress on daily workbooks.    Participation Level:  Active  Participation Quality:  Appropriate and Attentive  Affect:  Appropriate  Cognitive:  Appropriate  Insight: Appropriate  Engagement in Group:  Engaged  Modes of Intervention:  Discussion  Additional Comments:  Patient stated that he had a 4 out of 10 day.  Patient stated that he has been having some issues with his stomach.  Patient stated that he wants to focus on how to avoid stress when he goes home   Raymond Mcclure 03/06/2023, 9:18 PM

## 2023-03-06 NOTE — [facility] Group Notes (Signed)
[facility] Group Notes:  (Nursing)  Date:  03/06/2023  Time:  1400  Type of Therapy:  Music Therapy  Participation Level:  Did Not Attend   Shela Nevin 03/06/2023, 4:08 PM

## 2023-03-06 NOTE — Progress Notes (Signed)
   03/06/23 1800  Psych Admission Type (Psych Patients Only)  Admission Status Voluntary  Psychosocial Assessment  Patient Complaints None  Eye Contact Fair  Facial Expression Animated  Affect Appropriate to circumstance  Speech Logical/coherent  Interaction Assertive  Motor Activity Slow  Appearance/Hygiene Unremarkable  Behavior Characteristics Cooperative;Appropriate to situation  Mood Pleasant  Thought Process  Coherency WDL  Content WDL  Delusions None reported or observed  Perception Hallucinations  Hallucination None reported or observed  Judgment Limited  Confusion None  Danger to Self  Current suicidal ideation? Denies  Danger to Others  Danger to Others None reported or observed

## 2023-03-06 NOTE — Group Note (Signed)
[facility] LCSW Group Therapy Note    Group Date: 03/06/2023 Start Time: 1000 End Time: 1120  Type of Therapy and Topic:  Group Therapy:  Overcoming Obstacles  Participation Level:  [facility] PARTICIPATION LEVEL: Active  Mood:  Description of Group:   In this group patients will be encouraged to explore what they see as obstacles to their own wellness and recovery. They will be guided to discuss their thoughts, feelings, and behaviors related to these obstacles. The group will process together ways to cope with barriers, with attention given to specific choices patients can make. Each patient will be challenged to identify changes they are motivated to make in order to overcome their obstacles. This group will be process-oriented, with patients participating in exploration of their own experiences as well as giving and receiving support and challenge from other group members.  Therapeutic Goals: 1. Patient will identify personal and current obstacles as they relate to admission. 2. Patient will identify barriers that currently interfere with their wellness or overcoming obstacles.  3. Patient will identify feelings, thought process and behaviors related to these barriers. 4. Patient will identify two changes they are willing to make to overcome these obstacles:    Summary of Patient Progress Patient was very active in group, discussed many stressors that are currently affecting patient's mental health. Patient spoke about getting off drugs and becoming healthier for his grandchild.    Therapeutic Modalities:   Cognitive Behavioral Therapy Solution Focused Therapy Motivational Interviewing Relapse Prevention Therapy   Georgie Chard, LCSW

## 2023-03-06 NOTE — Plan of Care (Signed)
   Problem: Education: Goal: Mental status will improve Outcome: Progressing   Problem: Activity: Goal: Interest or engagement in activities will improve Outcome: Progressing

## 2023-03-07 ENCOUNTER — Other Ambulatory Visit (HOSPITAL_COMMUNITY): Payer: Self-pay

## 2023-03-07 DIAGNOSIS — F251 Schizoaffective disorder, depressive type: Secondary | ICD-10-CM | POA: Diagnosis not present

## 2023-03-07 MED ORDER — ESCITALOPRAM OXALATE 5 MG PO TABS
5.0000 mg | ORAL_TABLET | Freq: Every day | ORAL | Status: DC
  Administered 2023-03-07 – 2023-03-12 (×6): 5 mg via ORAL
  Filled 2023-03-07 (×2): qty 1
  Filled 2023-03-07: qty 14
  Filled 2023-03-07 (×3): qty 1
  Filled 2023-03-07: qty 14
  Filled 2023-03-07: qty 1

## 2023-03-07 NOTE — [facility] Group Notes (Signed)
Spiritual care group on grief and loss facilitated by Chaplain Dyanne Carrel, [facility]  Group Goal: Support / Education around grief and loss  Members engage in facilitated group support and psycho-social education.  Group Description:  Following introductions and group rules, group members engaged in facilitated group dialogue and support around topic of loss, with particular support around experiences of loss in their lives. Group Identified types of loss (relationships / self / things) and identified patterns, circumstances, and changes that precipitate losses. Reflected on thoughts / feelings around loss, normalized grief responses, and recognized variety in grief experience. Group encouraged individual reflection on safe space and on the coping skills that they are already utilizing.  Group drew on Adlerian / Rogerian and narrative framework  Patient Progress: Pt attended group and actively engaged and participated in group conversation and activities.  He shared about his anticipatory grief due to his mother's long time illness.

## 2023-03-07 NOTE — Progress Notes (Signed)
   03/06/23 2124  Psych Admission Type (Psych Patients Only)  Admission Status Voluntary  Psychosocial Assessment  Patient Complaints Depression;Anxiety  Eye Contact Fair  Facial Expression Flat  Affect Depressed  Speech Logical/coherent  Interaction Assertive  Motor Activity Slow  Appearance/Hygiene Unremarkable  Behavior Characteristics Cooperative  Mood Depressed;Pleasant  Thought Process  Coherency WDL  Content WDL  Delusions None reported or observed  Perception Hallucinations  Hallucination None reported or observed  Judgment Limited  Confusion None  Danger to Self  Current suicidal ideation? Denies  Agreement Not to Harm Self Yes  Description of Agreement verbal  Danger to Others  Danger to Others None reported or observed

## 2023-03-07 NOTE — Plan of Care (Signed)
 Nurse discussed coping skills with patient.

## 2023-03-07 NOTE — Progress Notes (Cosign Needed Addendum)
[facility] MD Progress Note  03/07/2023 8:51 AM Raymond Mcclure  MRN:  [id number] Subjective:  Raymond Mcclure is a 58 y.o. male  with a past psychiatric history of bipolar 1 disorder. Patient initially arrived to [facility] on 1/30 for suicidal ideation in the setting of command auditory hallucinations and substance use (cocaine, cannabis), and admitted to [facility] on 1/31 for acute safety concerns and stabilization of acute on chronic psychiatric conditions. 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   The patient's chart was reviewed and nursing notes were reviewed. Vitals signs: BP 143/94. The patient's case was discussed in multidisciplinary team meeting. Per [facility], patient was taking medications appropriately . The following as needed medications were given: tylenol, trazodone. Per nursing, patient is calm and cooperative and attended group sessions.   On assessment today, The patient was seen in his room, no acute distress. On assessment, the patient feels "up and down" today. Patient feels the group sessions have been good. When asked about speaking with family, he reports speaking to his ex wife. He has a depressed affect and is feeling worried regarding his future. He still plans on going to inpatient rehab to address his substance use. He notes recently feeling depressed which caused him to start using cocaine and drinking alcohol again.  Patient reports having poor sleep and attributes this to having to urinate at night.  Patient reports good appetite. He notes having a BM this AM and states it was liquidy but also with pebbles. He notes slightly improving lower abdominal pain. He states it improves once he has a bowel movement.  Patient feels that the medications have been fairly helpful regarding auditory  hallucinations and denies adverse effects.   Patient denies current SI and HI. He reports still having visual hallucinations, stating he sees shadows and figures.   Principal Problem: Bipolar disorder, mixed (HCC) Diagnosis: Active Problems:   Schizoaffective disorder, depressive type (HCC)   Stimulant use disorder   Alcohol use disorder, severe, dependence (HCC)  Total Time spent with patient: 20 minutes  Past Psychiatric History: Current psychiatrist: None Current therapist: denied. Previous psychiatric diagnoses: Schizoaffective disorder, bipolar type Current psychiatric medications: Patient appears to be taking Wellbutrin and naltrexone, has been off medications for a 2 to 3 weeks (later clarified, within a few months) Psychiatric medication history/compliance: Abilify, naltrexone, Wellbutrin Psychiatric hospitalization(s): Previously hospitalized 04/2021 at [facility] for suicide attempt (attempting to jump off a parking deck) and substance use. Psychotherapy history: none. Neuromodulation history: none. History of suicide (obtained from HPI): 4 previous suicide attempts, last 2 years ago (OD on fentanyl) requiring hospitalization History of homicide or aggression (obtained in HPI): Per chart review, patient had thoughts of "choking out my grandson" who was bothering him.  Reported irritability and agitation in caring for his demented mother.  Per public offender registry, patient was imprisoned from 1999-2005.  Past Medical History:  Past Medical History:  Diagnosis Date   Cluster headaches    Drug abuse (HCC)    Hypertension    Migraines    Obesity    Tachycardia     Past Surgical History:  Procedure Laterality Date   CARDIAC CATHETERIZATION     LOOP RECORDER IMPLANT     WRIST SURGERY     nerve repair   Family History: History reviewed. No pertinent family history. Family Psychiatric  History:  Psychiatric diagnoses: Daughter has bipolar disorder.  Cousin has a history  of schizophrenia. Suicide history: denied.  Violence/aggression:  Says entire family is violent towards each other. Social History:  Social History   Substance and Sexual Activity  Alcohol Use Yes   Comment: 1 Gallon/Day     Social History   Substance and Sexual Activity  Drug Use Yes   Types: "Crack" cocaine, Fentanyl, Cocaine, Marijuana   Comment: Daily Use    Social History   Socioeconomic History   Marital status: Legally Separated    Spouse name: Not on file   Number of children: Not on file   Years of education: Not on file   Highest education level: Not on file  Occupational History   Not on file  Tobacco Use   Smoking status: Some Days    Types: Cigars   Smokeless tobacco: Never   Tobacco comments:    Couple of days .  Smokes when stressed.  Vaping Use   Vaping status: Every Day  Substance and Sexual Activity   Alcohol use: Yes    Comment: 1 Gallon/Day   Drug use: Yes    Types: "Crack" cocaine, Fentanyl, Cocaine, Marijuana    Comment: Daily Use   Sexual activity: Not Currently    Comment: crack  Other Topics Concern   Not on file  Social History Narrative   Not on file   Social Drivers of Health   Financial Resource Strain: Low Risk  (02/10/2022)   Overall Financial Resource Strain (CARDIA)    Difficulty of Paying Living Expenses: Not hard at all  Food Insecurity: Food Insecurity Present (03/03/2023)   Hunger Vital Sign    Worried About Programme researcher, broadcasting/film/video in the Last Year: Often true    Ran Out of Food in the Last Year: Often true  Transportation Needs: Unmet Transportation Needs (03/03/2023)   PRAPARE - Administrator, Civil Service (Medical): Yes    Lack of Transportation (Non-Medical): Yes  Physical Activity: Not on file  Stress: No Stress Concern Present (02/10/2022)   [employer] of Occupational Health - Occupational Stress Questionnaire    Feeling of Stress : Not at all  Social Connections: Not on file   Additional Social  History:                         Sleep: Poor  Appetite:  Good  Current Medications: Current Facility-Administered Medications  Medication Dose Route Frequency Provider Last Rate Last Admin   acetaminophen (TYLENOL) tablet 650 mg  650 mg Oral Q6H PRN Sindy Guadeloupe, NP   650 mg at 03/06/23 1406   alum & mag hydroxide-simeth (MAALOX/MYLANTA) 200-200-20 MG/5ML suspension 30 mL  30 mL Oral Q4H PRN Sindy Guadeloupe, NP   30 mL at 03/04/23 1948   aspirin chewable tablet 81 mg  81 mg Oral Daily Massengill, Nathan, MD   81 mg at 03/07/23 0759   atorvastatin (LIPITOR) tablet 10 mg  10 mg Oral Daily Massengill, Nathan, MD   10 mg at 03/07/23 0801   diltiazem (CARDIZEM CD) 24 hr capsule 240 mg  240 mg Oral Daily Massengill, Nathan, MD   240 mg at 03/07/23 0801   famotidine (PEPCID) tablet 20 mg  20 mg Oral BID Eliseo Gum B, MD   20 mg at 03/07/23 0801   hydrOXYzine (ATARAX) tablet 25 mg  25 mg Oral TID PRN Sindy Guadeloupe, NP       isosorbide  mononitrate (IMDUR) 24 hr tablet 30 mg  30 mg Oral Daily Massengill, Nathan, MD   30 mg at 03/07/23 0801   loperamide (IMODIUM) capsule 2-4 mg  2-4 mg Oral PRN Massengill, Harrold Donath, MD       LORazepam (ATIVAN) tablet 1 mg  1 mg Oral Q6H PRN Massengill, Harrold Donath, MD       losartan (COZAAR) tablet 50 mg  50 mg Oral Daily Massengill, Harrold Donath, MD   50 mg at 03/07/23 0801   magnesium hydroxide (MILK OF MAGNESIA) suspension 30 mL  30 mL Oral Daily PRN Sindy Guadeloupe, NP   30 mL at 03/05/23 1942   meclizine (ANTIVERT) tablet 25 mg  25 mg Oral TID PRN Phineas Inches, MD       multivitamin with minerals tablet 1 tablet  1 tablet Oral Daily Massengill, Nathan, MD   1 tablet at 03/07/23 0802   nicotine (NICODERM CQ - dosed in mg/24 hours) patch 14 mg  14 mg Transdermal Daily Sindy Guadeloupe, NP   14 mg at 03/07/23 0805   OLANZapine (ZYPREXA) injection 10 mg  10 mg Intramuscular TID PRN Sindy Guadeloupe, NP       OLANZapine (ZYPREXA) injection 5 mg  5 mg Intramuscular TID  PRN Sindy Guadeloupe, NP       OLANZapine zydis (ZYPREXA) disintegrating tablet 5 mg  5 mg Oral TID PRN Sindy Guadeloupe, NP       ondansetron (ZOFRAN-ODT) disintegrating tablet 4 mg  4 mg Oral Q6H PRN Massengill, Nathan, MD       polyethylene glycol (MIRALAX / GLYCOLAX) packet 17 g  17 g Oral Daily Eliseo Gum B, MD   17 g at 03/07/23 0805   risperiDONE (RISPERDAL) tablet 3 mg  3 mg Oral QHS Eliseo Gum B, MD   3 mg at 03/06/23 2124   thiamine (Vitamin B-1) tablet 100 mg  100 mg Oral Daily Massengill, Harrold Donath, MD   100 mg at 03/07/23 0801   traZODone (DESYREL) tablet 50 mg  50 mg Oral QHS PRN Sindy Guadeloupe, NP   50 mg at 03/06/23 2124   umeclidinium-vilanterol (ANORO ELLIPTA) 62.5-25 MCG/ACT 1 puff  1 puff Inhalation Daily Otho Bellows, RPH   1 puff at 03/07/23 0801    Lab Results:  No results found for this or any previous visit (from the past 48 hours).   Blood Alcohol level:  Lab Results  Component Value Date   ETH <10 03/03/2023   ETH <10 02/16/2023    Metabolic Disorder Labs: Lab Results  Component Value Date   HGBA1C 5.7 (H) 03/03/2023   MPG 116.89 03/03/2023   No results found for: "PROLACTIN" Lab Results  Component Value Date   CHOL 159 03/03/2023   TRIG 65 03/03/2023   HDL 39 (L) 03/03/2023   CHOLHDL 4.1 03/03/2023   VLDL 13 03/03/2023   LDLCALC 107 (H) 03/03/2023   LDLCALC (H) 07/15/2006    102        Total Cholesterol/HDL:CHD Risk Coronary Heart Disease Risk Table                     Men   Women  1/2 Average Risk   3.4   3.3    Physical Findings: AIMS:  , ,  ,  ,    CIWA:  CIWA-Ar Total: 0 COWS:     Musculoskeletal: Strength & Muscle Tone: within normal limits Gait & Station: normal Patient leans: N/A  Psychiatric Specialty Exam:   Presentation  General Appearance:  Fairly Groomed; Appropriate for Environment  Eye Contact: Fair  Speech: Clear and Coherent; Slow  Speech Volume: Normal  Handedness: Right   Mood and Affect   Mood: Depressed; Hopeless  Affect: Depressed   Thought Process  Thought Processes: Coherent; Linear  Descriptions of Associations:Intact  Orientation:Full (Time, Place and Person)  Thought Content:Logical  History of Schizophrenia/Schizoaffective disorder: yes Duration of Psychotic Symptoms:Greater than six months  Hallucinations:Hallucinations: Visual Description of Auditory Hallucinations: see shadows, figures Description of Visual Hallucinations: denies today  Ideas of Reference:None  Suicidal Thoughts:Suicidal Thoughts: No  Homicidal Thoughts:Homicidal Thoughts: No   Sensorium  Memory: Remote Fair  Judgment: Fair  Insight: Lacking   Executive Functions  Concentration: Fair  Attention Span: Fair  Recall: [employer] of Knowledge: Fair  Language: Fair   Psychomotor Activity  Psychomotor Activity: Psychomotor Activity: Normal   Assets  Assets: Resilience; Desire for Improvement   Sleep  Sleep: Sleep: Fair    Physical Exam: Physical Exam Constitutional:      Appearance: Normal appearance.  HENT:     Head: Normocephalic and atraumatic.  Pulmonary:     Effort: Pulmonary effort is normal.  Neurological:     Mental Status: He is alert and oriented to person, place, and time.    Review of Systems  Gastrointestinal:  Positive for abdominal pain and diarrhea.  Psychiatric/Behavioral:  Positive for depression and hallucinations. Negative for suicidal ideas. The patient has insomnia.    Blood pressure (!) 143/94, pulse 92, temperature 98.2 F (36.8 C), temperature source Oral, resp. rate 16, height 5\' 10"  (1.778 m), weight 113.9 kg, SpO2 99%. Body mass index is 36.01 kg/m.   Treatment Plan Summary: Daily contact with patient to assess and evaluate symptoms and progress in treatment and Medication management  Patient notes improving AVH, now stating only having VH. While patient still reports depression, he states this is  attributed to feeling unsure of the future. I will not be starting an antidepressant due to the risk of inducing a manic episode. I discussed how his information will be faxed to inpatient rehab facilities. Patient reports improving BM with the added MiraLAX yesterday. He notes being interested in starting an LAI due to nonadherence to medications in the past, will inquire with pharmacy regarding what LAI is covered by his insurance. Patient does have multiple comorbid medical conditions which warrant being cautious of Risperdal's adverse effects of metabolic syndrome and weight gain.  ASSESSMENT:    Diagnoses / Active Problems: Schizoaffective disorder, depressive type Stimulant use disorder, cocaine type Cannabis use disorder PTSD    PLAN:   Safety and Monitoring: -  VOLUNTARY  admission to inpatient psychiatric unit for safety, stabilization and treatment. - Daily contact with patient to assess and evaluate symptoms and progress in treatment - Patient's case to be discussed in multi-disciplinary team meeting -  Observation Level : q15 minute checks -  Vital signs:  q12 hours -  Precautions: suicide, elopement, and assault   2. Psychiatric Diagnoses and Treatment:     # Schizoaffective disorder, bipolar type, in depressive episode # PTSD - Continue Risperdal 3 mg nightly for psychosis and mood stabilization - Start Lexapro 5 mg daily for depression  - The risks/benefits/side-effects/alternatives to this medication were discussed in detail with the patient and time was given for questions. The patient consents to medication trial.  - Metabolic profile and EKG monitoring obtained while on an atypical antipsychotic  BMI: 36.01 TSH: 0.697 Lipid panel: LDL 107 HbgA1c:  5.7 QTc: 430 - Encouraged patient to participate in unit milieu and in scheduled group therapies  - Short Term Goals: Ability to demonstrate self-control will improve, Ability to identify and develop effective coping  behaviors will improve, and Ability to identify triggers associated with substance abuse/mental health issues will improve - Long Term Goals: Improvement in symptoms so as ready for discharge   Other PRNS:    Other labs reviewed on admission: WBC 12.2 H. CMP WNL.  UDS positive for cocaine and marijuana.               3. Medical Issues Being Addressed:    # Tobacco Use Disorder  - Nicotine patch 21mg /24 hours ordered  - Smoking cessation encouraged.   # Hypertension - Restarting home meds including diltiazem 240, Imdur 30, losartan 50 mg   #Hyperlipidemia - Restart Lipitor 10 mg   #Arthrosclerosis #Left vertebral artery, carotid artery stenosis - Restart home aspirin 81 mg   #Obstructive sleep apnea -Encouraged patient to retrieve CPAP from [facility] when able  GERD - Continue famotidine 20 mg twice daily   4. Discharge Planning:    - Estimated discharge date: 5 to 7 days - Social work and case management to assist with discharge planning and identification of hospital follow-up needs prior to discharge. - Discharge concerns: Need to establish a safety plan; medication compliance and effectiveness. - Discharge goals: Patient reports interest in 30 to 45-day substance use treatment program.  Referrals will be faxed to [facility] and [facility] on Monday 2/3.   I certify that inpatient services furnished can reasonably be expected to improve the patient's condition.    Lance Muss, MD 03/07/2023, 8:51 AM

## 2023-03-07 NOTE — Progress Notes (Signed)
   03/07/23 8295  15 Minute Checks  Location Bedroom  Visual Appearance Calm  Behavior Sleeping  Sleep (Behavioral Health Patients Only)  Calculate sleep? (Click Yes once per 24 hr at 0600 safety check) Yes  Documented sleep last 24 hours 7.5

## 2023-03-07 NOTE — Progress Notes (Signed)
D:  Patient's self inventory sheet, patient has fair sleep, sleep medication helpful.  Good appetite, normal energy level, good concentration.  Rated depression and hopeless 5, anxiety 4.  Withdrawals, nausea, runny nose.  Denied SI.  Physical problems, lightheaded, pain, dizzy, stomach.  Physical pain, worst pain #3 in past 24 hours.  Pain medicine is helpful.  Goal is work on stress.  Plans to talk about stress today.  No discharge plans. A:  Medications administered per MD orders.  Emotional support and encouragement given patient. R:  Denied SI and HI, contracts for safety.  Denied A/V hallucinations.  Safety maintained with 15 minute checks.

## 2023-03-07 NOTE — Group Note (Signed)
Date:  03/07/2023 Time:  9:10 PM  Group Topic/Focus:  Wrap-Up Group:   The focus of this group is to help patients review their daily goal of treatment and discuss progress on daily workbooks.    Participation Level:  Active  Participation Quality:  Appropriate and Sharing  Affect:  Appropriate  Cognitive:  Appropriate  Insight: Appropriate  Engagement in Group:  Engaged  Modes of Intervention:  Activity and Socialization  Additional Comments:  Patients used "wrap up group sheets" as guide when sharing. Patient shared his day being a 8/10. Patient goal for today was to "fight stress" and patient achieved goal. Patient shared "listening" being a coping skills that he finds most helpful. Patient shared something that he likes about himself, "I try not to give up". Patient participated in activity after sharing.     Raymond Mcclure 03/07/2023, 9:10 PM

## 2023-03-07 NOTE — Plan of Care (Signed)
   Problem: Safety: Goal: Periods of time without injury will increase Outcome: Progressing

## 2023-03-07 NOTE — Group Note (Signed)
Recreation Therapy Group Note   Group Topic:Stress Management  Group Date: 03/07/2023 Start Time: 0935 End Time: 1000 Facilitators: Daron Stutz-McCall, LRT,CTRS Location: [address] Hall Dayroom   Group Topic: Stress Management  Goal Area(s) Addresses:  Patient will identify positive stress management techniques. Patient will identify benefits of using stress management post d/c.  Intervention: Insight Timer App  Activity : Meditation. LRT engaged with patients about meditation and the benefits of it. LRT then played a meditation that focused on getting prepared for the day and being able to speak positive affirmations to themselves when negative thoughts start to creep in. Patients were encouraged to relax, get in a comfortable position, focus on their breathing and be attentive to the meditation.   Education:  Stress Management, Discharge Planning.   Education Outcome: Acknowledges Education   Affect/Mood: Appropriate   Participation Level: Active   Participation Quality: Independent   Behavior: Attentive    Speech/Thought Process: Focused   Insight: Good   Judgement: Good   Modes of Intervention: App   Patient Response to Interventions:  Attentive   Education Outcome:  In group clarification offered    Clinical Observations/Individualized Feedback: Pt attended and participated in group session. Pt was attentive and focused throughout group.      Plan: Continue to engage patient in RT group sessions 2-3x/week.   Airyn Ellzey-McCall, LRT,CTRS 03/07/2023 11:27 AM

## 2023-03-08 ENCOUNTER — Other Ambulatory Visit (HOSPITAL_COMMUNITY): Payer: Self-pay

## 2023-03-08 DIAGNOSIS — F3160 Bipolar disorder, current episode mixed, unspecified: Secondary | ICD-10-CM | POA: Diagnosis not present

## 2023-03-08 MED ORDER — SENNA 8.6 MG PO TABS: 1.0000 | ORAL_TABLET | Freq: Every evening | ORAL | Status: DC | PRN

## 2023-03-08 MED ORDER — MAGNESIUM HYDROXIDE 400 MG/5ML PO SUSP: 30.0000 mL | Freq: Every day | ORAL | Status: DC

## 2023-03-08 MED ORDER — MAGNESIUM HYDROXIDE 400 MG/5ML PO SUSP: 30.0000 mL | Freq: Every day | ORAL | Status: DC | PRN

## 2023-03-08 MED ORDER — SENNA 8.6 MG PO TABS
1.0000 | ORAL_TABLET | Freq: Every day | ORAL | Status: DC
  Filled 2023-03-08: qty 1

## 2023-03-08 MED ORDER — LACTULOSE 10 GM/15ML PO SOLN
30.0000 g | Freq: Once | ORAL | Status: AC
  Administered 2023-03-08: 30 g via ORAL
  Filled 2023-03-08 (×2): qty 45

## 2023-03-08 MED ORDER — NALTREXONE HCL 50 MG PO TABS
25.0000 mg | ORAL_TABLET | Freq: Every day | ORAL | Status: DC
  Administered 2023-03-08: 25 mg via ORAL
  Filled 2023-03-08 (×3): qty 1

## 2023-03-08 NOTE — Progress Notes (Signed)
 [facility] MD Progress Note  03/08/2023 10:03 AM Raymond Mcclure  MRN:  [id number] Subjective:  Raymond Mcclure is a 58 y.o. male  with a past psychiatric history of bipolar 1 disorder. Patient initially arrived to [facility] on 1/30 for suicidal ideation in the setting of command auditory hallucinations and substance use (cocaine, cannabis), and admitted to [facility] on 1/31 for acute safety concerns and stabilization of acute on chronic psychiatric conditions. 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   The patient's chart was reviewed and nursing notes were reviewed. Vitals signs: BP 160/85. The patient's case was discussed in multidisciplinary team meeting. Per [facility], patient was taking medications appropriately . The following as needed medications were given: tylenol , atarax , milk of mag, trazodone . Per nursing, patient is anxious regarding abdominal pain.  The patient was seen in the group room, no acute distress. On assessment, the patient feels not so good today. Patient feels the group sessions have been good and states he resonated with the grief group and and stress. When asked about speaking with family, he denies.   Patient reports having poor sleep due to abdominal pain.  Patient reports fair appetite. Patient feels that the medications have been fair but is quite preoccupied with the abdominal pain. I discussed how I will adjust his medication to aid with this.  Patient denies current SI, HI, AVH. He is wanting to go to [facility] for inpatient rehab and plans to complete the phone interview. He is interested in starting naltrexone  for substance cravings.   Principal Problem: Bipolar disorder, mixed (HCC) Diagnosis: Active Problems:   Schizoaffective disorder, depressive type (HCC)   Stimulant use disorder    Alcohol use disorder, severe, dependence (HCC)  Total Time spent with patient: 20 minutes  Past Psychiatric History: Current psychiatrist: None Current therapist: denied. Previous psychiatric diagnoses: Schizoaffective disorder, bipolar type Current psychiatric medications: Patient appears to be taking Wellbutrin  and naltrexone , has been off medications for a 2 to 3 weeks (later clarified, within a few months) Psychiatric medication history/compliance: Abilify , naltrexone , Wellbutrin  Psychiatric hospitalization(s): Previously hospitalized 04/2021 at [facility] for suicide attempt (attempting to jump off a parking deck) and substance use. Psychotherapy history: none. Neuromodulation history: none. History of suicide (obtained from HPI): 4 previous suicide attempts, last 2 years ago (OD on fentanyl ) requiring hospitalization History of homicide or aggression (obtained in HPI): Per chart review, patient had thoughts of choking out my grandson who was bothering him.  Reported irritability and agitation in caring for his demented mother.  Per public offender registry, patient was imprisoned from 1999-2005.  Past Medical History:  Past Medical History:  Diagnosis Date   Cluster headaches    Drug abuse (HCC)    Hypertension    Migraines    Obesity    Tachycardia     Past Surgical History:  Procedure Laterality Date   CARDIAC CATHETERIZATION     LOOP RECORDER IMPLANT     WRIST SURGERY     nerve repair   Family History: History reviewed. No pertinent family history. Family Psychiatric  History: Psychiatric diagnoses: Daughter has bipolar disorder.  Cousin has a history of schizophrenia. Suicide history: denied.  Violence/aggression:  Says entire family is violent towards each other. Social History:  Social History   Substance and Sexual Activity  Alcohol Use Yes   Comment: 1 Gallon/Day  Social History   Substance and Sexual Activity  Drug Use Yes   Types: Crack cocaine,  Fentanyl , Cocaine, Marijuana   Comment: Daily Use    Social History   Socioeconomic History   Marital status: Legally Separated    Spouse name: Not on file   Number of children: Not on file   Years of education: Not on file   Highest education level: Not on file  Occupational History   Not on file  Tobacco Use   Smoking status: Some Days    Types: Cigars   Smokeless tobacco: Never   Tobacco comments:    Couple of days .  Smokes when stressed.  Vaping Use   Vaping status: Every Day  Substance and Sexual Activity   Alcohol use: Yes    Comment: 1 Gallon/Day   Drug use: Yes    Types: Crack cocaine, Fentanyl , Cocaine, Marijuana    Comment: Daily Use   Sexual activity: Not Currently    Comment: crack  Other Topics Concern   Not on file  Social History Narrative   Not on file   Social Drivers of Health   Financial Resource Strain: Low Risk  (02/10/2022)   Overall Financial Resource Strain (CARDIA)    Difficulty of Paying Living Expenses: Not hard at all  Food Insecurity: Food Insecurity Present (03/03/2023)   Hunger Vital Sign    Worried About Programme Researcher, Broadcasting/film/video in the Last Year: Often true    Ran Out of Food in the Last Year: Often true  Transportation Needs: Unmet Transportation Needs (03/03/2023)   PRAPARE - Administrator, Civil Service (Medical): Yes    Lack of Transportation (Non-Medical): Yes  Physical Activity: Not on file  Stress: No Stress Concern Present (02/10/2022)   [employer] of Occupational Health - Occupational Stress Questionnaire    Feeling of Stress : Not at all  Social Connections: Not on file   Additional Social History:                         Sleep: Poor  Appetite:  Good  Current Medications: Current Facility-Administered Medications  Medication Dose Route Frequency Provider Last Rate Last Admin   acetaminophen  (TYLENOL ) tablet 650 mg  650 mg Oral Q6H PRN Trudy Carwin, NP   650 mg at 03/07/23 2111   alum &  mag hydroxide-simeth (MAALOX/MYLANTA) 200-200-20 MG/5ML suspension 30 mL  30 mL Oral Q4H PRN Trudy Carwin, NP   30 mL at 03/04/23 1948   aspirin  chewable tablet 81 mg  81 mg Oral Daily Massengill, Rankin, MD   81 mg at 03/08/23 0758   atorvastatin  (LIPITOR) tablet 10 mg  10 mg Oral Daily Massengill, Nathan, MD   10 mg at 03/08/23 0757   diltiazem  (CARDIZEM  CD) 24 hr capsule 240 mg  240 mg Oral Daily Massengill, Rankin, MD   240 mg at 03/08/23 9364   escitalopram  (LEXAPRO ) tablet 5 mg  5 mg Oral Daily Maritta Kief B, MD   5 mg at 03/08/23 0758   famotidine  (PEPCID ) tablet 20 mg  20 mg Oral BID McQuilla, Jai B, MD   20 mg at 03/08/23 0757   hydrOXYzine  (ATARAX ) tablet 25 mg  25 mg Oral TID PRN Trudy Carwin, NP   25 mg at 03/08/23 9364   isosorbide  mononitrate (IMDUR ) 24 hr tablet 30 mg  30 mg Oral Daily Massengill, Rankin, MD   30 mg at 03/08/23 0757   losartan  (  COZAAR ) tablet 50 mg  50 mg Oral Daily Massengill, Rankin, MD   50 mg at 03/08/23 0757   magnesium  hydroxide (MILK OF MAGNESIA) suspension 30 mL  30 mL Oral Daily PRN Enrique Manganaro B, MD       meclizine  (ANTIVERT ) tablet 25 mg  25 mg Oral TID PRN Massengill, Nathan, MD       multivitamin with minerals tablet 1 tablet  1 tablet Oral Daily Massengill, Nathan, MD   1 tablet at 03/08/23 0757   naltrexone  (DEPADE) tablet 25 mg  25 mg Oral QHS Markesia Crilly B, MD       nicotine  (NICODERM CQ  - dosed in mg/24 hours) patch 14 mg  14 mg Transdermal Daily Trudy Carwin, NP   14 mg at 03/08/23 9242   OLANZapine  (ZYPREXA ) injection 10 mg  10 mg Intramuscular TID PRN Trudy Carwin, NP       OLANZapine  (ZYPREXA ) injection 5 mg  5 mg Intramuscular TID PRN Trudy Carwin, NP       OLANZapine  zydis (ZYPREXA ) disintegrating tablet 5 mg  5 mg Oral TID PRN Trudy Carwin, NP       polyethylene glycol (MIRALAX  / GLYCOLAX ) packet 17 g  17 g Oral Daily McQuilla, Jai B, MD   17 g at 03/08/23 0757   risperiDONE  (RISPERDAL ) tablet 3 mg  3 mg Oral QHS McQuilla, Jai  B, MD   3 mg at 03/07/23 2039   senna (SENOKOT) tablet 8.6 mg  1 tablet Oral QHS PRN Haiden Rawlinson B, MD       thiamine  (Vitamin B-1) tablet 100 mg  100 mg Oral Daily Massengill, Nathan, MD   100 mg at 03/08/23 0757   traZODone  (DESYREL ) tablet 50 mg  50 mg Oral QHS PRN Trudy Carwin, NP   50 mg at 03/07/23 2039   umeclidinium-vilanterol (ANORO ELLIPTA ) 62.5-25 MCG/ACT 1 puff  1 puff Inhalation Daily Landy Veva CROME, RPH   1 puff at 03/08/23 9241    Lab Results:  No results found for this or any previous visit (from the past 48 hours).   Blood Alcohol level:  Lab Results  Component Value Date   ETH <10 03/03/2023   ETH <10 02/16/2023    Metabolic Disorder Labs: Lab Results  Component Value Date   HGBA1C 5.7 (H) 03/03/2023   MPG 116.89 03/03/2023   No results found for: PROLACTIN Lab Results  Component Value Date   CHOL 159 03/03/2023   TRIG 65 03/03/2023   HDL 39 (L) 03/03/2023   CHOLHDL 4.1 03/03/2023   VLDL 13 03/03/2023   LDLCALC 107 (H) 03/03/2023   LDLCALC (H) 07/15/2006    102        Total Cholesterol/HDL:CHD Risk Coronary Heart Disease Risk Table                     Men   Women  1/2 Average Risk   3.4   3.3    Physical Findings: AIMS:  , ,  ,  ,    CIWA:  CIWA-Ar Total: 1 COWS:     Musculoskeletal: Strength & Muscle Tone: within normal limits Gait & Station: normal Patient leans: N/A  Psychiatric Specialty Exam:   Presentation  General Appearance:  Fairly Groomed; Appropriate for Environment  Eye Contact: Fair  Speech: Clear and Coherent; Slow  Speech Volume: Normal  Handedness: Right   Mood and Affect  Mood: Depressed; Hopeless  Affect: Depressed; Congruent   Thought Process  Thought Processes:  Coherent; Linear  Descriptions of Associations:Intact  Orientation:Full (Time, Place and Person)  Thought Content:Logical  History of Schizophrenia/Schizoaffective disorder: yes Duration of Psychotic Symptoms:Greater than six  months  Hallucinations:Hallucinations: None Description of Auditory Hallucinations: see shadows, figures Description of Visual Hallucinations: denies today  Ideas of Reference:None  Suicidal Thoughts:Suicidal Thoughts: No  Homicidal Thoughts:Homicidal Thoughts: No   Sensorium  Memory: Remote Good  Judgment: Fair  Insight: Fair   Chartered Certified Accountant: Fair  Attention Span: Fair  Recall: [employer] of Knowledge: Fair  Language: Fair   Psychomotor Activity  Psychomotor Activity: Psychomotor Activity: Normal   Assets  Assets: Resilience; Desire for Improvement   Sleep  Sleep: Sleep: Fair    Physical Exam: Physical Exam Constitutional:      Appearance: Normal appearance.  HENT:     Head: Normocephalic and atraumatic.  Pulmonary:     Effort: Pulmonary effort is normal.  Neurological:     Mental Status: He is alert and oriented to person, place, and time.    Review of Systems  Gastrointestinal:  Positive for abdominal pain and diarrhea.  Psychiatric/Behavioral:  Positive for depression and hallucinations. Negative for suicidal ideas. The patient has insomnia.    Blood pressure (!) 160/90, pulse 86, temperature 98.1 F (36.7 C), temperature source Oral, resp. rate 20, height 5' 10 (1.778 m), weight 113.9 kg, SpO2 97%. Body mass index is 36.01 kg/m.   Treatment Plan Summary: Daily contact with patient to assess and evaluate symptoms and progress in treatment and Medication management    ASSESSMENT:    Diagnoses / Active Problems: Schizoaffective disorder, depressive type Stimulant use disorder, cocaine type Cannabis use disorder PTSD    PLAN:   Safety and Monitoring: -  VOLUNTARY  admission to inpatient psychiatric unit for safety, stabilization and treatment. - Daily contact with patient to assess and evaluate symptoms and progress in treatment - Patient's case to be discussed in multi-disciplinary team meeting -   Observation Level : q15 minute checks -  Vital signs:  q12 hours -  Precautions: suicide, elopement, and assault   2. Psychiatric Diagnoses and Treatment:     # Schizoaffective disorder, bipolar type, in depressive episode # PTSD - Continue Risperdal  3 mg nightly for psychosis and mood stabilization - Continue Lexapro  5 mg daily for depression  - Start Naltrexone  25 mg qhs and if tolerating can increase to 50 mg  - The risks/benefits/side-effects/alternatives to this medication were discussed in detail with the patient and time was given for questions. The patient consents to medication trial.  - Metabolic profile and EKG monitoring obtained while on an atypical antipsychotic  BMI: 36.01 TSH: 0.697 Lipid panel: LDL 107 HbgA1c: 5.7 QTc: 430 - Encouraged patient to participate in unit milieu and in scheduled group therapies  - Short Term Goals: Ability to demonstrate self-control will improve, Ability to identify and develop effective coping behaviors will improve, and Ability to identify triggers associated with substance abuse/mental health issues will improve - Long Term Goals: Improvement in symptoms so as ready for discharge   Other PRNS:    Other labs reviewed on admission: WBC 12.2 H. CMP WNL.  UDS positive for cocaine and marijuana.               3. Medical Issues Being Addressed:   #Constipation  -Start senna 8.6 mg daily PRN at bedtime -Start lactulose  one time dose and reassess after two hours -Milk of Mag 30 mL PRN  -Miralax  17 g daily  #  Tobacco Use Disorder  - Nicotine  patch 21mg /24 hours ordered  - Smoking cessation encouraged.   # Hypertension - Restarting home meds including diltiazem  240, Imdur  30, losartan  50 mg   #Hyperlipidemia - Restart Lipitor 10 mg   #Arthrosclerosis #Left vertebral artery, carotid artery stenosis - Restart home aspirin  81 mg   #Obstructive sleep apnea -Encouraged patient to retrieve CPAP from [facility] when able  GERD -  Continue famotidine  20 mg twice daily   4. Discharge Planning:    - Estimated discharge date: 5 to 7 days - Social work and case management to assist with discharge planning and identification of [facility] follow-up needs prior to discharge. - Discharge concerns: Need to establish a safety plan; medication compliance and effectiveness. - Discharge goals: Patient reports interest in 30 to 45-day substance use treatment program.  Pending phone interview with [facility]   I certify that inpatient services furnished can reasonably be expected to improve the patient's condition.    Ismael KATHEE Franco, MD 03/08/2023, 10:03 AM

## 2023-03-08 NOTE — Group Note (Signed)
 Recreation Therapy Group Note   Group Topic:Animal Assisted Therapy   Group Date: 03/08/2023 Start Time: 9049 End Time: 1030 Facilitators: Talasia Saulter-McCall, LRT,CTRS Location: [address] Dayroom   Animal-Assisted Activity (AAA) Program Checklist/Progress Notes Patient Eligibility Criteria Checklist & Daily Group note for Rec Tx Intervention  AAA/T Program Assumption of Risk Form signed by Patient/ or Parent Legal Guardian Yes  Patient is free of allergies or severe asthma Yes  Patient reports no fear of animals Yes  Patient reports no history of cruelty to animals Yes  Patient understands his/her participation is voluntary Yes  Patient washes hands before animal contact Yes  Patient washes hands after animal contact Yes  Education: Hand Washing, Appropriate Animal Interaction   Education Outcome: Acknowledges education.    Affect/Mood: Appropriate   Participation Level: Engaged   Participation Quality: Independent   Behavior: Appropriate   Speech/Thought Process: Focused   Insight: Good   Judgement: Good   Modes of Intervention: Teaching Laboratory Technician   Patient Response to Interventions:  Receptive   Education Outcome:  In group clarification offered    Clinical Observations/Individualized Feedback: Patient attended session and interacted appropriately with therapy dog and peers. Patient asked appropriate questions about therapy dog and his training. Patient eventually dosed off during group session but did wake up.      Plan: Continue to engage patient in RT group sessions 2-3x/week.   Brady Plant-McCall, LRT,CTRS  03/08/2023 1:55 PM

## 2023-03-08 NOTE — Plan of Care (Signed)
   Problem: Education: Goal: Knowledge of [facility] Education information/materials will improve Outcome: Progressing   Problem: Activity: Goal: Interest or engagement in activities will improve Outcome: Progressing   Problem: Coping: Goal: Ability to verbalize frustrations and anger appropriately will improve Outcome: Progressing

## 2023-03-08 NOTE — Progress Notes (Addendum)
 Patient informed writer that he was still unable to pass a bowel movement following Lactulose  administration. MD notified, second dose of Lactulose  ordered and administered. Writer encouraged patient to drink fluids and walk around to promote a bowel movement. Patient verbalized understanding.

## 2023-03-08 NOTE — Progress Notes (Addendum)
     03/08/2023       8:06 AM   Ray[facility]   Type of Note: Substance use treatment  [facility]: Denied due to [employer]   [facility]: Referral sent this morning, will have pt call and complete phone screen this afternoon.  8:20AM: Pt was given number for facility and agreeable to complete phone screen this afternoon. Will continue to assist.   Signed:  Arely Tinner, LCSW-A 03/08/2023  8:06 AM

## 2023-03-08 NOTE — Care Management Important Message (Signed)
Medicare IM printed for Aldean Ast, LCSW to give to the patient.

## 2023-03-08 NOTE — Progress Notes (Signed)
 Pt complains of lower abdominal pain r/t constipation, pt given milk of magnesia and tylenol  before bed last evening. Pt reports this morning that he has not moved his bowel, although passing gas. Pt requesting for an x-ray to rule out any blockage, will continue to monitor.

## 2023-03-08 NOTE — Progress Notes (Incomplete)
     03/08/2023       3:54 PM   Nanetta Batty   Type of Note: [facility]  Pt completed phone screening this afternoon at 2:30PM.      Signed:  Antrice Pal, LCSW-A 03/08/2023  3:54 PM

## 2023-03-08 NOTE — [facility] Suicide Risk Assessment (Signed)
 [facility] INPATIENT:  Family/Significant Other Suicide Prevention Education  Suicide Prevention Education:  Contact Attempts: Yechiel Erny, daughter, [phone], (name of family member/significant other) has been identified by the patient as the family member/significant other with whom the patient will be residing, and identified as the person(s) who will aid the patient in the event of a mental health crisis.  With written consent from the patient, two attempts were made to provide suicide prevention education, prior to and/or following the patient's discharge.  We were unsuccessful in providing suicide prevention education.  A suicide education pamphlet was given to the patient to share with family/significant other.  Date and time of first attempt: 03/07/23 @ 1400 Date and time of second attempt: 03/08/23 @ 1515 and 1536  Continuously hanging up phone when attempting to connect.   [name] 03/08/2023, 3:35 PM

## 2023-03-08 NOTE — Progress Notes (Signed)
   03/07/23 2200  Psych Admission Type (Psych Patients Only)  Admission Status Voluntary  Psychosocial Assessment  Patient Complaints Anxiety  Eye Contact Fair  Facial Expression Animated  Affect Appropriate to circumstance  Speech Logical/coherent  Interaction Assertive  Motor Activity Slow  Appearance/Hygiene Unremarkable  Behavior Characteristics Cooperative;Appropriate to situation  Mood Pleasant  Thought Process  Coherency WDL  Content WDL  Delusions None reported or observed  Perception WDL  Hallucination None reported or observed  Judgment Poor  Confusion None  Danger to Self  Current suicidal ideation? Denies  Description of Suicide Plan none  Agreement Not to Harm Self Yes  Description of Agreement verbal  Danger to Others  Danger to Others None reported or observed

## 2023-03-08 NOTE — Plan of Care (Signed)
   Problem: Education: Goal: Emotional status will improve Outcome: Progressing Goal: Mental status will improve Outcome: Progressing   Problem: Activity: Goal: Interest or engagement in activities will improve Outcome: Progressing Goal: Sleeping patterns will improve Outcome: Progressing

## 2023-03-08 NOTE — Group Note (Signed)
 Date:  03/08/2023 Time:  9:26 PM  Group Topic/Focus:  Wrap-Up Group:   The focus of this group is to help patients review their daily goal of treatment and discuss progress on daily workbooks.    Participation Level:  Active  Participation Quality:  Appropriate and Attentive  Affect:  Appropriate  Cognitive:  Alert and Appropriate  Insight: Appropriate and Good  Engagement in Group:  Engaged  Modes of Intervention:  Discussion and Education  Additional Comments:  Pt attended and participated in wrap up group this evening and rated their day a 9/10. Pt stated that their overall day went down after phone calls from their family involving their grandchildren. Pt feels that they are not able to care for themself due to their family constantly calling the hospital. Pt goal is to get into another facility once they are D/C.   Rosella DELENA Pouch 03/08/2023, 9:26 PM

## 2023-03-08 NOTE — Progress Notes (Addendum)
 Pt denied SI/HI/AVH this morning. Pt rated his depression a 4/10, anxiety a 7/10, and feelings of hopelessness a 3/10. Pt complains of abdominal pain this morning that is a cramping pain that he believes is due to constipation. MD notified of patient's constipation, prescribed Lactulose  administered par MAR. Pt's BP has remained elevated throughout the morning, rechecked manually by this writer, MD is aware. Pt has been pleasant, calm, and cooperative throughout the shift. RN provided support and encouragement to patient. Pt given scheduled medications as prescribed. Q15 min checks verified for safety. Patient verbally contracts for safety. Patient compliant with medications and treatment plan. Patient is interacting well on the unit. Pt is safe on the unit.   03/08/23 9062  Psych Admission Type (Psych Patients Only)  Admission Status Voluntary  Psychosocial Assessment  Patient Complaints Other (Comment) (Pain from constipation)  Eye Contact Fair  Facial Expression Flat;Sad  Affect Depressed  Speech Logical/coherent  Interaction Assertive  Motor Activity Slow  Appearance/Hygiene Unremarkable  Behavior Characteristics Cooperative;Appropriate to situation  Mood Depressed;Sad  Thought Process  Coherency WDL  Content Preoccupation (Preoccupied with constipation)  Delusions None reported or observed  Perception WDL  Hallucination None reported or observed  Judgment Impaired  Confusion None  Danger to Self  Current suicidal ideation? Denies  Description of Suicide Plan No plan  Agreement Not to Harm Self Yes  Description of Agreement Verbal  Danger to Others  Danger to Others None reported or observed

## 2023-03-08 NOTE — Group Note (Signed)
 LCSW Group Therapy Note   Group Date: 03/07/2023 Start Time: 1300 End Time: 1400   Participation:  patient was present.  He actively listened but didn't participate in the discussion.   Type of Therapy:  Group Therapy   Topic:  Stronger Together: Improving Interpersonal Skills for Better Relationships  Objective: To enhance interpersonal skills by improving communication, setting healthy boundaries, and fostering positive relationships.  Goals: Identify healthy and unhealthy relationship qualities. Practice active listening and assertive communication. Develop an action plan for setting boundaries and improving relationships.  Summary: In this class, participants explored key elements of building strong, healthy relationships. They learned about different communication styles, practiced active listening, and discussed the importance of setting boundaries. The group created actionable steps to improve their interpersonal skills and relationships.  Therapeutic Modalities Used: Elements of Cognitive Behavioral Therapy (reframing and thought restructuring, problem solving) Elements of DBT (mindfulness)   Catherene KIDD Emil Klassen, LCSWA 03/08/2023  6:34 PM

## 2023-03-08 NOTE — Care Management Important Message (Signed)
 Patient informed of right to appeal discharge, provided phone number to [facility]. Patient expressed no interest in appealing discharge at this time. CSW will continue to monitor situation.

## 2023-03-08 NOTE — Progress Notes (Signed)
Patient reports that he was able to have a few small bowel movements following second dose of Lactulose. Pt still endorses some abdominal cramping but believes that it is improving.

## 2023-03-09 ENCOUNTER — Encounter (HOSPITAL_COMMUNITY): Payer: Self-pay

## 2023-03-09 ENCOUNTER — Ambulatory Visit: Payer: Medicare PPO | Attending: Cardiology | Admitting: Cardiology

## 2023-03-09 DIAGNOSIS — F3160 Bipolar disorder, current episode mixed, unspecified: Secondary | ICD-10-CM | POA: Diagnosis not present

## 2023-03-09 LAB — PROLACTIN: Prolactin: 31.2 ng/mL — ABNORMAL HIGH (ref 3.6–25.2)

## 2023-03-09 MED ORDER — NALTREXONE HCL 50 MG PO TABS
50.0000 mg | ORAL_TABLET | Freq: Every day | ORAL | Status: DC
  Administered 2023-03-10 – 2023-03-13 (×4): 50 mg via ORAL
  Filled 2023-03-09: qty 1
  Filled 2023-03-09: qty 21
  Filled 2023-03-09: qty 14
  Filled 2023-03-09: qty 21
  Filled 2023-03-09: qty 1
  Filled 2023-03-09: qty 14
  Filled 2023-03-09: qty 21

## 2023-03-09 MED ORDER — NALTREXONE HCL 50 MG PO TABS
25.0000 mg | ORAL_TABLET | Freq: Every day | ORAL | Status: AC
  Administered 2023-03-09: 25 mg via ORAL
  Filled 2023-03-09: qty 1

## 2023-03-09 MED ORDER — IBUPROFEN 200 MG PO TABS
200.0000 mg | ORAL_TABLET | Freq: Three times a day (TID) | ORAL | Status: DC | PRN
  Administered 2023-03-09 – 2023-03-13 (×4): 200 mg via ORAL
  Filled 2023-03-09 (×4): qty 1

## 2023-03-09 NOTE — Progress Notes (Signed)
   03/09/23 0156  Psych Admission Type (Psych Patients Only)  Admission Status Voluntary  Psychosocial Assessment  Patient Complaints Anxiety  Eye Contact Fair  Facial Expression Animated  Affect Appropriate to circumstance  Speech Logical/coherent  Interaction Assertive  Motor Activity Slow  Appearance/Hygiene Unremarkable  Behavior Characteristics Cooperative;Appropriate to situation  Mood Pleasant  Thought Process  Coherency WDL  Content WDL  Delusions None reported or observed  Perception WDL  Hallucination None reported or observed  Judgment Poor  Confusion None  Danger to Self  Current suicidal ideation? Denies  Agreement Not to Harm Self Yes  Description of Agreement verbal  Danger to Others  Danger to Others None reported or observed

## 2023-03-09 NOTE — Progress Notes (Signed)
     03/09/2023       10:11 AM   Fairy JONETTA Sharps   Type of Note: [facility]  Pt completed phone screening yesterday at 2:30PM.  This writer called back this morning regarding acceptance and spoke with [facility] in admissions. Joen reports pt was accepted by the psychologist, occupational however records are to be reviewed by nursing staff this morning. [facility] will give a call back to this writer today regarding decision. Will continue to assist, MD aware of update.  Signed:  Win Guajardo, LCSW-A 03/09/2023  10:11 AM

## 2023-03-09 NOTE — Group Note (Signed)
 Recreation Therapy Group Note   Group Topic:Team Building  Group Date: 03/09/2023 Start Time: 9062 End Time: 0959 Facilitators: Falana Clagg-McCall, LRT,CTRS Location: [address] Dayroom   Group Topic: Communication, Team Building, Problem Solving  Goal Area(s) Addresses:  Patient will effectively work with peer towards shared goal.  Patient will identify skills used to make activity successful.  Patient will identify how skills used during activity can be applied to reach post d/c goals.   Intervention: STEM Activity- Glass Blower/designer  Activity: Tallest [employer]. In teams of 5-6, patients were given 11 craft pipe cleaners. Using the materials provided, patients were instructed to compete again the opposing team(s) to build the tallest free-standing structure from floor level. The activity was timed; difficulty increased by clinical research associate as production designer, theatre/television/film continued.  Systematically resources were removed with additional directions for example, placing one arm behind their back, working in silence, and shape stipulations. LRT facilitated post-activity discussion reviewing team processes and necessary communication skills involved in completion. Patients were encouraged to reflect how the skills utilized, or not utilized, in this activity can be incorporated to positively impact support systems post discharge.  Education: Pharmacist, Community, Scientist, Physiological, Discharge Planning   Education Outcome: Acknowledges education/In group clarification offered/Needs additional education.    Affect/Mood: Flat   Participation Level: Minimal   Participation Quality: Independent   Behavior: On-looking   Speech/Thought Process: Barely audible    Insight: None   Judgement: None   Modes of Intervention: Team-building   Patient Response to Interventions:  Receptive   Education Outcome:  In group clarification offered    Clinical Observations/Individualized Feedback: Pt came into group  late. Pt appeared tired and drowsy during group session. Pt sat quietly and watched as team put tower together. Pt eventually fell asleep during group.    Plan: Continue to engage patient in RT group sessions 2-3x/week.   Aybree Lanyon-McCall, LRT,CTRS 03/09/2023 1:31 PM

## 2023-03-09 NOTE — Progress Notes (Signed)
   03/09/23 1000  Psych Admission Type (Psych Patients Only)  Admission Status Voluntary  Psychosocial Assessment  Patient Complaints Anxiety;Substance abuse  Eye Contact Fair  Facial Expression Animated  Affect Appropriate to circumstance  Speech Logical/coherent  Interaction Assertive  Motor Activity Slow  Appearance/Hygiene Unremarkable  Behavior Characteristics Cooperative  Mood Pleasant  Thought Process  Coherency WDL  Content WDL  Delusions None reported or observed  Perception WDL  Hallucination None reported or observed  Judgment Poor  Confusion None  Danger to Self  Current suicidal ideation? Denies  Danger to Others  Danger to Others None reported or observed

## 2023-03-09 NOTE — [facility] Group Notes (Signed)
 [facility] Group Notes:  (Nursing/MHT/Case Management/Adjunct)  Date:  03/09/2023  Time:  10:48 PM  Type of Therapy:  Psychoeducational Skills  Participation Level:  Active  Participation Quality:  Attentive  Affect:  Depressed  Cognitive:  Lacking  Insight:  Lacking  Engagement in Group:  Developing/Improving  Modes of Intervention:  Education  Summary of Progress/Problems: The patient rated his day as a 1 out of 10 since he complained that he felt sick all day. In addition, he stated that he had a headache that had yet to go away. His positive event for the day is that he attended the groups.   Marlissa Emerick S 03/09/2023, 10:48 PM

## 2023-03-09 NOTE — Plan of Care (Signed)
   Problem: Education: Goal: Knowledge of [facility] Education information/materials will improve Outcome: Progressing   Problem: Activity: Goal: Interest or engagement in activities will improve Outcome: Progressing   Problem: Coping: Goal: Ability to verbalize frustrations and anger appropriately will improve Outcome: Progressing

## 2023-03-09 NOTE — Progress Notes (Signed)
 [facility] MD Progress Note  03/09/2023 8:56 AM Raymond Mcclure  MRN:  [id number] Subjective:  Raymond Mcclure is a 58 y.o. male  with a past psychiatric history of bipolar 1 disorder. Patient initially arrived to [facility] on 1/30 for suicidal ideation in the setting of command auditory hallucinations and substance use (cocaine, cannabis), and admitted to [facility] on 1/31 for acute safety concerns and stabilization of acute on chronic psychiatric conditions. 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   The patient's chart was reviewed and nursing notes were reviewed. Vitals signs: BP 149/98. The patient's case was discussed in multidisciplinary team meeting. Per [facility], patient was taking medications appropriately . The following as needed medications were given: atarax  2x, trazodone . Per nursing, patient is calm and cooperative and attended group sessions.   The patient was seen in his room, no acute distress. On assessment, the patient feels much better today. Patient feels the group sessions have been nice and states he participated in Dresser and pet therapy.  Patient reports having good sleep.  Patient reports good appetite. Patient feels that the medications have been helpful in keeping him calm and is able to think clearer and denies adverse effects.   Patient denies current SI, HI, AVH. He said the last time he had VH was 3 days ago. He is hopeful about going to [facility] for rehab services.   Principal Problem: Bipolar disorder, mixed (HCC) Diagnosis: Active Problems:   Schizoaffective disorder, depressive type (HCC)   Stimulant use disorder   Alcohol use disorder, severe, dependence (HCC)  Total Time spent with patient: 20 minutes  Past Psychiatric History: Current psychiatrist: None Current therapist:  denied. Previous psychiatric diagnoses: Schizoaffective disorder, bipolar type Current psychiatric medications: Patient appears to be taking Wellbutrin  and naltrexone , has been off medications for a 2 to 3 weeks (later clarified, within a few months) Psychiatric medication history/compliance: Abilify , naltrexone , Wellbutrin  Psychiatric hospitalization(s): Previously hospitalized 04/2021 at [facility] for suicide attempt (attempting to jump off a parking deck) and substance use. Psychotherapy history: none. Neuromodulation history: none. History of suicide (obtained from HPI): 4 previous suicide attempts, last 2 years ago (OD on fentanyl ) requiring hospitalization History of homicide or aggression (obtained in HPI): Per chart review, patient had thoughts of choking out my grandson who was bothering him.  Reported irritability and agitation in caring for his demented mother.  Per public offender registry, patient was imprisoned from 1999-2005.  Past Medical History:  Past Medical History:  Diagnosis Date   Cluster headaches    Drug abuse (HCC)    Hypertension    Migraines    Obesity    Tachycardia     Past Surgical History:  Procedure Laterality Date   CARDIAC CATHETERIZATION     LOOP RECORDER IMPLANT     WRIST SURGERY     nerve repair   Family History: History reviewed. No pertinent family history. Family Psychiatric  History: Psychiatric diagnoses: Daughter has bipolar disorder.  Cousin has a history of schizophrenia. Suicide history: denied.  Violence/aggression:  Says entire family is violent towards each other. Social History:  Social History   Substance and Sexual Activity  Alcohol Use Yes   Comment: 1 Gallon/Day     Social History   Substance and Sexual Activity  Drug Use Yes   Types: Crack cocaine, Fentanyl , Cocaine, Marijuana   Comment: Daily  Use    Social History   Socioeconomic History   Marital status: Legally Separated    Spouse name: Not on file    Number of children: Not on file   Years of education: Not on file   Highest education level: Not on file  Occupational History   Not on file  Tobacco Use   Smoking status: Some Days    Types: Cigars   Smokeless tobacco: Never   Tobacco comments:    Couple of days .  Smokes when stressed.  Vaping Use   Vaping status: Every Day  Substance and Sexual Activity   Alcohol use: Yes    Comment: 1 Gallon/Day   Drug use: Yes    Types: Crack cocaine, Fentanyl , Cocaine, Marijuana    Comment: Daily Use   Sexual activity: Not Currently    Comment: crack  Other Topics Concern   Not on file  Social History Narrative   Not on file   Social Drivers of Health   Financial Resource Strain: Low Risk  (02/10/2022)   Overall Financial Resource Strain (CARDIA)    Difficulty of Paying Living Expenses: Not hard at all  Food Insecurity: Food Insecurity Present (03/03/2023)   Hunger Vital Sign    Worried About Programme Researcher, Broadcasting/film/video in the Last Year: Often true    Ran Out of Food in the Last Year: Often true  Transportation Needs: Unmet Transportation Needs (03/03/2023)   PRAPARE - Administrator, Civil Service (Medical): Yes    Lack of Transportation (Non-Medical): Yes  Physical Activity: Not on file  Stress: No Stress Concern Present (02/10/2022)   [employer] of Occupational Health - Occupational Stress Questionnaire    Feeling of Stress : Not at all  Social Connections: Not on file   Additional Social History:                         Sleep: Poor  Appetite:  Good  Current Medications: Current Facility-Administered Medications  Medication Dose Route Frequency Provider Last Rate Last Admin   acetaminophen  (TYLENOL ) tablet 650 mg  650 mg Oral Q6H PRN Trudy Carwin, NP   650 mg at 03/07/23 2111   alum & mag hydroxide-simeth (MAALOX/MYLANTA) 200-200-20 MG/5ML suspension 30 mL  30 mL Oral Q4H PRN Trudy Carwin, NP   30 mL at 03/04/23 1948   aspirin  chewable tablet 81 mg   81 mg Oral Daily Massengill, Rankin, MD   81 mg at 03/09/23 9165   atorvastatin  (LIPITOR) tablet 10 mg  10 mg Oral Daily Massengill, Rankin, MD   10 mg at 03/09/23 9165   diltiazem  (CARDIZEM  CD) 24 hr capsule 240 mg  240 mg Oral Daily Massengill, Rankin, MD   240 mg at 03/09/23 9165   escitalopram  (LEXAPRO ) tablet 5 mg  5 mg Oral Daily Kensey Luepke B, MD   5 mg at 03/09/23 9165   famotidine  (PEPCID ) tablet 20 mg  20 mg Oral BID McQuilla, Jai B, MD   20 mg at 03/09/23 9165   hydrOXYzine  (ATARAX ) tablet 25 mg  25 mg Oral TID PRN Trudy Carwin, NP   25 mg at 03/08/23 2106   isosorbide  mononitrate (IMDUR ) 24 hr tablet 30 mg  30 mg Oral Daily Massengill, Rankin, MD   30 mg at 03/09/23 9164   losartan  (COZAAR ) tablet 50 mg  50 mg Oral Daily Massengill, Rankin, MD   50 mg at 03/09/23 9165   magnesium  hydroxide (MILK  OF MAGNESIA) suspension 30 mL  30 mL Oral Daily PRN Kiernan Atkerson B, MD       meclizine  (ANTIVERT ) tablet 25 mg  25 mg Oral TID PRN Johny Lot, MD       multivitamin with minerals tablet 1 tablet  1 tablet Oral Daily Massengill, Nathan, MD   1 tablet at 03/09/23 0835   naltrexone  (DEPADE) tablet 25 mg  25 mg Oral QHS Cailan General B, MD   25 mg at 03/08/23 2106   nicotine  (NICODERM CQ  - dosed in mg/24 hours) patch 14 mg  14 mg Transdermal Daily Trudy Carwin, NP   14 mg at 03/09/23 9164   OLANZapine  (ZYPREXA ) injection 10 mg  10 mg Intramuscular TID PRN Trudy Carwin, NP       OLANZapine  (ZYPREXA ) injection 5 mg  5 mg Intramuscular TID PRN Trudy Carwin, NP       OLANZapine  zydis (ZYPREXA ) disintegrating tablet 5 mg  5 mg Oral TID PRN Trudy Carwin, NP       polyethylene glycol (MIRALAX  / GLYCOLAX ) packet 17 g  17 g Oral Daily McQuilla, Jai B, MD   17 g at 03/09/23 9163   risperiDONE  (RISPERDAL ) tablet 3 mg  3 mg Oral QHS McQuilla, Jai B, MD   3 mg at 03/08/23 2107   senna (SENOKOT) tablet 8.6 mg  1 tablet Oral QHS PRN Cynthia Cogle B, MD       thiamine  (Vitamin B-1) tablet 100  mg  100 mg Oral Daily Massengill, Lot, MD   100 mg at 03/09/23 9164   traZODone  (DESYREL ) tablet 50 mg  50 mg Oral QHS PRN Trudy Carwin, NP   50 mg at 03/08/23 2106   umeclidinium-vilanterol (ANORO ELLIPTA ) 62.5-25 MCG/ACT 1 puff  1 puff Inhalation Daily Landy Veva CROME, RPH   1 puff at 03/09/23 9164    Lab Results:  Results for orders placed or performed during the hospital encounter of 03/03/23 (from the past 48 hours)  Prolactin     Status: Abnormal   Collection Time: 03/08/23  6:31 AM  Result Value Ref Range   Prolactin 31.2 (H) 3.6 - 25.2 ng/mL    Comment: (NOTE) Performed At: [facility] [address] [place], [place] [id number] Jennette Shorter MD Ey:[id number]      Blood Alcohol level:  Lab Results  Component Value Date   [facility] <10 03/03/2023   ETH <10 02/16/2023    Metabolic Disorder Labs: Lab Results  Component Value Date   HGBA1C 5.7 (H) 03/03/2023   MPG 116.89 03/03/2023   Lab Results  Component Value Date   PROLACTIN 31.2 (H) 03/08/2023   Lab Results  Component Value Date   CHOL 159 03/03/2023   TRIG 65 03/03/2023   HDL 39 (L) 03/03/2023   CHOLHDL 4.1 03/03/2023   VLDL 13 03/03/2023   LDLCALC 107 (H) 03/03/2023   LDLCALC (H) 07/15/2006    102        Total Cholesterol/HDL:CHD Risk Coronary Heart Disease Risk Table                     Men   Women  1/2 Average Risk   3.4   3.3    Physical Findings: AIMS:  , ,  ,  ,    CIWA:  CIWA-Ar Total: 0 COWS:     Musculoskeletal: Strength & Muscle Tone: within normal limits Gait & Station: normal Patient leans: N/A  Psychiatric Specialty Exam:   Presentation  General  Appearance:  Appropriate for Environment; Casual; Fairly Groomed  Eye Contact: Good  Speech: Clear and Coherent; Normal Rate  Speech Volume: Normal  Handedness: Right   Mood and Affect  Mood: Depressed  Affect: Congruent; Appropriate; Full Range   Thought Process  Thought Processes: Coherent; Goal Directed;  Linear  Descriptions of Associations:Intact  Orientation:Full (Time, Place and Person)  Thought Content:Logical  History of Schizophrenia/Schizoaffective disorder: yes Duration of Psychotic Symptoms:N/A  Hallucinations:Hallucinations: None  Ideas of Reference:None  Suicidal Thoughts:Suicidal Thoughts: No  Homicidal Thoughts:Homicidal Thoughts: No   Sensorium  Memory: Immediate Good; Recent Good; Remote Good  Judgment: Good  Insight: Good   Executive Functions  Concentration: Good  Attention Span: Good  Recall: Good  Fund of Knowledge: Good  Language: Good   Psychomotor Activity  Psychomotor Activity: Psychomotor Activity: Normal   Assets  Assets: Communication Skills; Desire for Improvement; Resilience   Sleep  Sleep: Sleep: Fair    Physical Exam: Physical Exam Constitutional:      Appearance: Normal appearance.  HENT:     Head: Normocephalic and atraumatic.  Pulmonary:     Effort: Pulmonary effort is normal.  Neurological:     Mental Status: He is alert and oriented to person, place, and time.    Review of Systems  Gastrointestinal:  Positive for abdominal pain and diarrhea.  Psychiatric/Behavioral:  Positive for depression and hallucinations. Negative for suicidal ideas. The patient has insomnia.    Blood pressure (!) 149/98, pulse 87, temperature 99 F (37.2 C), temperature source Oral, resp. rate 20, height 5' 10 (1.778 m), weight 113.9 kg, SpO2 99%. Body mass index is 36.01 kg/m.   Treatment Plan Summary: Daily contact with patient to assess and evaluate symptoms and progress in treatment and Medication management    ASSESSMENT:    Diagnoses / Active Problems: Schizoaffective disorder, depressive type Stimulant use disorder, cocaine type Cannabis use disorder PTSD    PLAN:   Safety and Monitoring: -  VOLUNTARY  admission to inpatient psychiatric unit for safety, stabilization and treatment. - Daily contact  with patient to assess and evaluate symptoms and progress in treatment - Patient's case to be discussed in multi-disciplinary team meeting -  Observation Level : q15 minute checks -  Vital signs:  q12 hours -  Precautions: suicide, elopement, and assault   2. Psychiatric Diagnoses and Treatment:     # Schizoaffective disorder, bipolar type, in depressive episode # PTSD - Continue Risperdal  3 mg nightly for psychosis and mood stabilization - Continue Lexapro  5 mg daily for depression  - Increase Naltrexone  to 50 mg qhs starting tmrw - The risks/benefits/side-effects/alternatives to this medication were discussed in detail with the patient and time was given for questions. The patient consents to medication trial.  - Metabolic profile and EKG monitoring obtained while on an atypical antipsychotic  BMI: 36.01 TSH: 0.697 Lipid panel: LDL 107 HbgA1c: 5.7 QTc: 430 - Encouraged patient to participate in unit milieu and in scheduled group therapies  - Short Term Goals: Ability to demonstrate self-control will improve, Ability to identify and develop effective coping behaviors will improve, and Ability to identify triggers associated with substance abuse/mental health issues will improve - Long Term Goals: Improvement in symptoms so as ready for discharge   Other PRNS:    Other labs reviewed on admission: WBC 12.2 H. CMP WNL.  UDS positive for cocaine and marijuana.               3. Medical Issues Being Addressed:   #  Constipation  -Start senna 8.6 mg daily PRN at bedtime -Start lactulose  one time dose and reassess after two hours -Milk of Mag 30 mL PRN  -Miralax  17 g daily  # Tobacco Use Disorder  - Nicotine  patch 21mg /24 hours ordered  - Smoking cessation encouraged.   # Hypertension - Restarting home meds including diltiazem  240, Imdur  30, losartan  50 mg   #Hyperlipidemia - Restart Lipitor 10 mg   #Arthrosclerosis #Left vertebral artery, carotid artery stenosis - Restart home  aspirin  81 mg   #Obstructive sleep apnea -Encouraged patient to retrieve CPAP from [facility] when able  GERD - Continue famotidine  20 mg twice daily   4. Discharge Planning:    - Estimated discharge date: 5 to 7 days - Social work and case management to assist with discharge planning and identification of hospital follow-up needs prior to discharge. - Discharge concerns: Need to establish a safety plan; medication compliance and effectiveness. - Discharge goals: Patient reports interest in 30 to 45-day substance use treatment program.  Pending phone interview results with [facility]   I certify that inpatient services furnished can reasonably be expected to improve the patient's condition.    Ismael KATHEE Franco, MD 03/09/2023, 8:56 AM

## 2023-03-09 NOTE — Plan of Care (Signed)

## 2023-03-09 NOTE — Group Note (Signed)
 Date:  03/09/2023 Time:  9:05 AM  Group Topic/Focus:  Goals Group:   The focus of this group is to help patients establish daily goals to achieve during treatment and discuss how the patient can incorporate goal setting into their daily lives to aide in recovery. Orientation:   The focus of this group is to educate the patient on the purpose and policies of crisis stabilization and provide a format to answer questions about their admission.  The group details unit policies and expectations of patients while admitted.    Participation Level:  Active  Participation Quality:  Appropriate     Raymond Mcclure 03/09/2023, 9:05 AM

## 2023-03-09 NOTE — Progress Notes (Signed)
 Adult Psychoeducational Group Note  Date:  03/09/2023 Time:  9:47 PM  Group Topic/Focus:  Wrap-Up Group:   The focus of this group is to help patients review their daily goal of treatment and discuss progress on daily workbooks.  Participation Level:  Active  Participation Quality:  Appropriate  Affect:  Appropriate  Cognitive:  Appropriate  Insight: Appropriate  Engagement in Group:  Engaged  Modes of Intervention:  Discussion  Additional Comments:  Pt attended NA group and actively participated.  Raymond Mcclure D 03/09/2023, 9:47 PM

## 2023-03-09 NOTE — BH IP Treatment Plan (Signed)
 Interdisciplinary Treatment and Diagnostic Plan Update  03/09/2023 Time of Session: 1200 - UPDATE Raymond Mcclure MRN: [id number]  Principal Diagnosis: Bipolar disorder, mixed (HCC)  Secondary Diagnoses: Active Problems:   Schizoaffective disorder, depressive type (HCC)   Stimulant use disorder   Alcohol use disorder, severe, dependence (HCC)   Current Medications:  Current Facility-Administered Medications  Medication Dose Route Frequency Provider Last Rate Last Admin   acetaminophen  (TYLENOL ) tablet 650 mg  650 mg Oral Q6H PRN Trudy Carwin, NP   650 mg at 03/09/23 1318   alum & mag hydroxide-simeth (MAALOX/MYLANTA) 200-200-20 MG/5ML suspension 30 mL  30 mL Oral Q4H PRN Trudy Carwin, NP   30 mL at 03/04/23 1948   aspirin  chewable tablet 81 mg  81 mg Oral Daily Massengill, Rankin, MD   81 mg at 03/09/23 9165   atorvastatin  (LIPITOR) tablet 10 mg  10 mg Oral Daily Massengill, Rankin, MD   10 mg at 03/09/23 9165   diltiazem  (CARDIZEM  CD) 24 hr capsule 240 mg  240 mg Oral Daily Massengill, Rankin, MD   240 mg at 03/09/23 9165   escitalopram  (LEXAPRO ) tablet 5 mg  5 mg Oral Daily Hoang, Daniela B, MD   5 mg at 03/09/23 9165   famotidine  (PEPCID ) tablet 20 mg  20 mg Oral BID McQuilla, Jai B, MD   20 mg at 03/09/23 0834   hydrOXYzine  (ATARAX ) tablet 25 mg  25 mg Oral TID PRN Trudy Carwin, NP   25 mg at 03/08/23 2106   isosorbide  mononitrate (IMDUR ) 24 hr tablet 30 mg  30 mg Oral Daily Massengill, Rankin, MD   30 mg at 03/09/23 9164   losartan  (COZAAR ) tablet 50 mg  50 mg Oral Daily Massengill, Rankin, MD   50 mg at 03/09/23 0834   magnesium  hydroxide (MILK OF MAGNESIA) suspension 30 mL  30 mL Oral Daily PRN Hoang, Daniela B, MD       meclizine  (ANTIVERT ) tablet 25 mg  25 mg Oral TID PRN Massengill, Nathan, MD       multivitamin with minerals tablet 1 tablet  1 tablet Oral Daily Massengill, Nathan, MD   1 tablet at 03/09/23 9164   naltrexone  (DEPADE) tablet 25 mg  25 mg Oral QHS Hoang, Daniela  B, MD       Followed by   NOREEN ON 03/10/2023] naltrexone  (DEPADE) tablet 50 mg  50 mg Oral QHS Hoang, Daniela B, MD       nicotine  (NICODERM CQ  - dosed in mg/24 hours) patch 14 mg  14 mg Transdermal Daily Trudy Carwin, NP   14 mg at 03/09/23 9164   OLANZapine  (ZYPREXA ) injection 10 mg  10 mg Intramuscular TID PRN Trudy Carwin, NP       OLANZapine  (ZYPREXA ) injection 5 mg  5 mg Intramuscular TID PRN Trudy Carwin, NP       OLANZapine  zydis (ZYPREXA ) disintegrating tablet 5 mg  5 mg Oral TID PRN Trudy Carwin, NP       polyethylene glycol (MIRALAX  / GLYCOLAX ) packet 17 g  17 g Oral Daily McQuilla, Jai B, MD   17 g at 03/09/23 9163   risperiDONE  (RISPERDAL ) tablet 3 mg  3 mg Oral QHS McQuilla, Jai B, MD   3 mg at 03/08/23 2107   senna (SENOKOT) tablet 8.6 mg  1 tablet Oral QHS PRN Hoang, Daniela B, MD       thiamine  (Vitamin B-1) tablet 100 mg  100 mg Oral Daily Massengill, Rankin, MD   100 mg  at 03/09/23 9164   traZODone  (DESYREL ) tablet 50 mg  50 mg Oral QHS PRN Trudy Carwin, NP   50 mg at 03/08/23 2106   umeclidinium-vilanterol (ANORO ELLIPTA ) 62.5-25 MCG/ACT 1 puff  1 puff Inhalation Daily Landy Veva CROME, RPH   1 puff at 03/09/23 9164   PTA Medications: Medications Prior to Admission  Medication Sig Dispense Refill Last Dose/Taking   BEVESPI AEROSPHERE 9-4.8 MCG/ACT AERO Inhale 2 puffs into the lungs 2 (two) times daily.   03/01/2023   [facility] ASPIRIN  ADULT LOW DOSE 81 MG chewable tablet Chew 81 mg by mouth daily.   Past Month   isosorbide  mononitrate (IMDUR ) 30 MG 24 hr tablet 30 mg daily.   Past Week   meclizine  (ANTIVERT ) 25 MG tablet Take 1 tablet (25 mg total) by mouth 3 (three) times daily as needed for dizziness. 10 tablet 0 Past Month   naltrexone  (DEPADE) 50 MG tablet Take 1 tablet (50 mg total) by mouth daily. 30 tablet 1 Past Month   omeprazole  (PRILOSEC) 20 MG capsule Take 1 capsule (20 mg total) by mouth daily. 30 capsule 0 Past Week   ondansetron  (ZOFRAN ) 4 MG tablet Take 1 tablet  (4 mg total) by mouth every 8 (eight) hours as needed for nausea or vomiting. 20 tablet 0 Past Week   Vitamin D, Ergocalciferol, (DRISDOL) 1.25 MG (50000 UNIT) CAPS capsule Take 50,000 Units by mouth every Friday.   Past Month   ARIPiprazole  (ABILIFY ) 10 MG tablet TAKE 1 TABLET BY MOUTH DAILY (Patient not taking: Reported on 03/04/2023) 30 tablet 10 Not Taking   atorvastatin  (LIPITOR) 10 MG tablet Take 10 mg by mouth daily. (Patient not taking: Reported on 03/04/2023)   Not Taking   buPROPion  (WELLBUTRIN  XL) 300 MG 24 hr tablet Take 1 tablet (300 mg total) by mouth daily. (Patient not taking: Reported on 03/04/2023) 30 tablet 0 Not Taking   diltiazem  (CARDIZEM  CD) 240 MG 24 hr capsule Take 240 mg by mouth daily. (Patient not taking: Reported on 03/04/2023)   Not Taking   losartan  (COZAAR ) 25 MG tablet  (Patient not taking: Reported on 03/04/2023)   Not Taking    Patient Stressors: Marital or family conflict   Medication change or noncompliance   Substance abuse    Patient Strengths: General fund of knowledge  Motivation for treatment/growth  Supportive family/friends   Treatment Modalities: Medication Management, Group therapy, Case management,  1 to 1 session with clinician, Psychoeducation, Recreational therapy.   Physician Treatment Plan for Primary Diagnosis: Bipolar disorder, mixed (HCC) Long Term Goal(s):     Short Term Goals: Ability to demonstrate self-control will improve Ability to identify and develop effective coping behaviors will improve Ability to identify triggers associated with substance abuse/mental health issues will improve  Medication Management: Evaluate patient's response, side effects, and tolerance of medication regimen.  Therapeutic Interventions: 1 to 1 sessions, Unit Group sessions and Medication administration.  Evaluation of Outcomes: Progressing  Physician Treatment Plan for Secondary Diagnosis: Active Problems:   Schizoaffective disorder, depressive type  (HCC)   Stimulant use disorder   Alcohol use disorder, severe, dependence (HCC)  Long Term Goal(s):     Short Term Goals: Ability to demonstrate self-control will improve Ability to identify and develop effective coping behaviors will improve Ability to identify triggers associated with substance abuse/mental health issues will improve     Medication Management: Evaluate patient's response, side effects, and tolerance of medication regimen.  Therapeutic Interventions: 1 to 1 sessions, Unit Group sessions  and Medication administration.  Evaluation of Outcomes: Progressing   RN Treatment Plan for Primary Diagnosis: Bipolar disorder, mixed (HCC) Long Term Goal(s): Knowledge of disease and therapeutic regimen to maintain health will improve  Short Term Goals: Ability to remain free from injury will improve, Ability to verbalize frustration and anger appropriately will improve, Ability to participate in decision making will improve, Ability to disclose and discuss suicidal ideas, and Compliance with prescribed medications will improve  Medication Management: RN will administer medications as ordered by provider, will assess and evaluate patient's response and provide education to patient for prescribed medication. RN will report any adverse and/or side effects to prescribing provider.  Therapeutic Interventions: 1 on 1 counseling sessions, Psychoeducation, Medication administration, Evaluate responses to treatment, Monitor vital signs and CBGs as ordered, Perform/monitor CIWA, COWS, AIMS and Fall Risk screenings as ordered, Perform wound care treatments as ordered.  Evaluation of Outcomes: Progressing   LCSW Treatment Plan for Primary Diagnosis: Bipolar disorder, mixed (HCC) Long Term Goal(s): Safe transition to appropriate next level of care at discharge, Engage patient in therapeutic group addressing interpersonal concerns.  Short Term Goals: Engage patient in aftercare planning with  referrals and resources, Increase ability to appropriately verbalize feelings, Facilitate patient progression through stages of change regarding substance use diagnoses and concerns, and Increase skills for wellness and recovery  Therapeutic Interventions: Assess for all discharge needs, 1 to 1 time with Social worker, Explore available resources and support systems, Assess for adequacy in community support network, Educate family and significant other(s) on suicide prevention, Complete Psychosocial Assessment, Interpersonal group therapy.  Evaluation of Outcomes: Progressing   Progress in Treatment: Attending groups: Yes. Participating in groups: Yes. Taking medication as prescribed: Yes. Toleration medication: Yes. Family/Significant other contact made: No, will contact:  Laurance Heide, daughter (payee) [phone] Patient understands diagnosis: Yes. Discussing patient identified problems/goals with staff: Yes. Medical problems stabilized or resolved: Yes. Denies suicidal/homicidal ideation: Yes. Issues/concerns per patient self-inventory: No.   New problem(s) identified: No   New Short Term/Long Term Goal(s):     medication stabilization, elimination of SI thoughts, development of comprehensive mental wellness plan.    Patient Goals:  I need help with taking my medications again.  Patient admitted to using cocaine, alcohol and fentanyl  and requested to  be referred to a residential treatment facility.    Discharge Plan or Barriers:  Patient recently admitted. CSW will continue to follow and assess for appropriate referrals and possible discharge planning.    Reason for Continuation of Hospitalization: Depression Hallucinations Medication stabilization Suicidal ideation   Estimated Length of Stay:  2-3 days  Last 3 [place] Suicide Severity Risk Score: Flowsheet Row Ad[facility] (Current[facility] Most recent reading at  03/03/2023 11:45 PM ED from 03/03/2023 in [facility] Most recent reading at 03/03/2023  5:44 PM ED from 02/16/2023 in [facility] Emergency Department at [facility] Most recent reading at 02/16/2023 11:42 AM  C-SSRS RISK CATEGORY High Risk High Risk No Risk       Last PHQ 2/9 Scores:    02/10/2022    2:07 PM 09/09/2021   11:11 AM 08/27/2021    9:59 AM  Depression screen PHQ 2/9  Decreased Interest 0 3 1  Down, Depressed, Hopeless 0 0 0  PHQ - 2 Score 0 3 1  Altered sleeping  3 3  Tired, decreased energy  3 3  Change in appetite  3 3  Feeling bad  or failure about yourself   0 0  Trouble concentrating  2 2  Moving slowly or fidgety/restless  1 1  Suicidal thoughts  0 0  PHQ-9 Score  15 13  Difficult doing work/chores  Somewhat difficult Somewhat difficult    Scribe for Treatment Team: [name], [place]RA[place] 03/09/2023 2:17 PM

## 2023-03-09 NOTE — Group Note (Signed)
 Date:  03/09/2023 Time:  5:27 PM  Group Topic/Focus:  Self Care:   The focus of this group is to help patients understand the importance of self-care in order to improve or restore emotional, physical, spiritual, interpersonal, and financial health.    Participation Level:  Minimal  Participation Quality:  Appropriate   Raymond Mcclure 03/09/2023, 5:27 PM

## 2023-03-09 NOTE — [facility] Group Notes (Signed)

## 2023-03-10 NOTE — [facility] Suicide Risk Assessment (Signed)
 [facility] INPATIENT:  Family/Significant Other Suicide Prevention Education  Suicide Prevention Education:  Contact Attempts: Raymond Mcclure, daughter, [phone] , (name of family member/significant other) has been identified by the patient as the family member/significant other with whom the patient will be residing, and identified as the person(s) who will aid the patient in the event of a mental health crisis.  With written consent from the patient, two attempts were made to provide suicide prevention education, prior to and/or following the patient's discharge.  We were unsuccessful in providing suicide prevention education.  A suicide education pamphlet was given to the patient to share with family/significant other.  Date and time of third attempt: 03/10/23 @ 1012  Answered phone and then hung up.   [name] 03/10/2023, 10:12 AM

## 2023-03-10 NOTE — Plan of Care (Signed)
   Problem: Education: Goal: Knowledge of [facility] Education information/materials will improve Outcome: Progressing   Problem: Activity: Goal: Interest or engagement in activities will improve Outcome: Progressing   Problem: Coping: Goal: Ability to verbalize frustrations and anger appropriately will improve Outcome: Progressing

## 2023-03-10 NOTE — [facility] Group Notes (Signed)
 [facility] Group Notes:  (Nursing/MHT/Case Management/Adjunct)  Date:  03/10/2023  Time:  9:36 PM  Type of Therapy:   Wrap-up group  Participation Level:  Active  Participation Quality:  Appropriate  Affect:  Appropriate  Cognitive:  Appropriate  Insight:  Appropriate  Engagement in Group:  Engaged  Modes of Intervention:  Education  Summary of Progress/Problems: Pt goal to pay his bills. Met goal. Rated day 9/10.  Grayce LITTIE Essex 03/10/2023, 9:36 PM

## 2023-03-10 NOTE — Progress Notes (Signed)
 D: Patient is alert, oriented, pleasant, and cooperative. Denies SI, HI, AVH, and verbally contracts for safety. Patient reports he slept good last night without sleeping medication. Patient reports his appetite as fair, energy level as normal, and concentration as poor. Patient rates his depression 3/10, hopelessness 10/10, and anxiety 3/10.    A: Scheduled medications administered per MD order. Support provided. Patient educated on safety on the unit and medications. Routine safety checks every 15 minutes. Patient stated understanding to tell nurse about any new physical symptoms. Patient understands to tell staff of any needs.     R: No adverse drug reactions noted. Patient remains safe at this time and will continue to monitor.    03/10/23 1000  Psych Admission Type (Psych Patients Only)  Admission Status Voluntary  Psychosocial Assessment  Patient Complaints None  Eye Contact Fair  Facial Expression Animated  Affect Appropriate to circumstance  Speech Logical/coherent  Interaction Assertive  Motor Activity Slow  Appearance/Hygiene Unremarkable  Behavior Characteristics Cooperative;Appropriate to situation  Mood Pleasant  Thought Process  Coherency WDL  Content WDL  Delusions None reported or observed  Perception WDL  Hallucination None reported or observed  Judgment Poor  Confusion None  Danger to Self  Current suicidal ideation? Denies  Agreement Not to Harm Self Yes  Description of Agreement verbal  Danger to Others  Danger to Others None reported or observed

## 2023-03-10 NOTE — Group Note (Signed)
 LCSW Group Therapy Note  Group Date: 03/10/2023 Start Time: 1100 End Time: 1200  Participation:  patient was present.  He slept through part of the group activity and discussions.    Type:  Group Therapy  Topic:  Nurturing Your Mind and Body Through Calm  Objective:  Learn techniques for managing stress through body relaxation, mindfulness, and self-compassion.  Goals: Use body relaxation techniques, such as Box Breathing and Progressive Muscle Relaxation, to reduce physical tension. Practice mindfulness to break the cycle of overthinking and mental chatter. Embrace self-compassion to handle stress with kindness and resilience.  Summary:  Today's session focused on calming the body with relaxation techniques, breaking the cycle of stress with mindfulness, and using self-compassion to manage challenges more gracefully.  These tools help reduce stress and foster a balanced, peaceful mindset.  Therapeutic Modalities:  Elements of CBT (cognitive restructuring)  Elements of DBT (box breathing, progressive body relaxation, mindfulness, acceptance)    Toneshia Coello O Alaya Iverson, LCSWA 03/10/2023  1:39 PM

## 2023-03-10 NOTE — Group Note (Signed)
 Date:  03/10/2023 Time:  2:08 PM  Group Topic/Focus:  Goals Group:   The focus of this group is to help patients establish daily goals to achieve during treatment and discuss how the patient can incorporate goal setting into their daily lives to aide in recovery. Orientation:   The focus of this group is to educate the patient on the purpose and policies of crisis stabilization and provide a format to answer questions about their admission.  The group details unit policies and expectations of patients while admitted.    Participation Level:  Active  Participation Quality:  Appropriate  Affect:  Appropriate  Cognitive:  Appropriate  Insight: Appropriate  Engagement in Group:  Engaged  Modes of Intervention:  Discussion  Additional Comments:    Emin Foree D Que Meneely 03/10/2023, 2:08 PM

## 2023-03-10 NOTE — Plan of Care (Signed)

## 2023-03-10 NOTE — Progress Notes (Signed)
     03/10/2023       3:14 PM   Raymond Mcclure   Type of Note: [facility]  Pt was accepted to [facility] and will admit there on Monday, 03/13/22 at 9:30AM.  Signed:  Carollynn Pennywell, LCSW-A 03/10/2023  3:14 PM

## 2023-03-10 NOTE — Group Note (Signed)
 Date:  03/10/2023 Time:  2:41 PM  Group Topic/Focus:  Emotional Education:   The focus of this group is to discuss boundaries, different types of boundaries, as well as healthy ways to communicate with others.    Participation Level:  Active  Participation Quality:  Appropriate  Affect:  Appropriate  Cognitive:  Appropriate  Insight: Appropriate  Engagement in Group:  Engaged  Modes of Intervention:  Discussion and Education  Additional Comments:    Raymond Mcclure 03/10/2023, 2:41 PM

## 2023-03-10 NOTE — Progress Notes (Signed)
   03/09/23 2045  Psych Admission Type (Psych Patients Only)  Admission Status Voluntary  Psychosocial Assessment  Patient Complaints Depression  Eye Contact Fair  Facial Expression Animated;Anxious  Affect Appropriate to circumstance  Speech Logical/coherent  Interaction Assertive  Motor Activity Slow  Appearance/Hygiene Unremarkable  Behavior Characteristics Appropriate to situation;Cooperative  Mood Pleasant  Thought Process  Coherency WDL  Content WDL  Delusions None reported or observed  Perception WDL  Hallucination None reported or observed  Judgment Poor  Confusion None  Danger to Self  Current suicidal ideation? Denies  Agreement Not to Harm Self Yes  Description of Agreement verbal  Danger to Others  Danger to Others None reported or observed

## 2023-03-10 NOTE — Progress Notes (Signed)
   03/10/23 2100  Psych Admission Type (Psych Patients Only)  Admission Status Voluntary  Psychosocial Assessment  Patient Complaints None  Eye Contact Fair  Facial Expression Animated  Affect Appropriate to circumstance  Speech Logical/coherent  Interaction Assertive  Motor Activity Slow  Appearance/Hygiene Unremarkable  Behavior Characteristics Cooperative;Appropriate to situation  Mood Pleasant  Thought Process  Coherency WDL  Content WDL  Delusions None reported or observed  Perception WDL  Hallucination None reported or observed  Judgment Poor  Confusion None  Danger to Self  Current suicidal ideation? Denies  Description of Suicide Plan no plan  Agreement Not to Harm Self Yes  Description of Agreement verbal  Danger to Others  Danger to Others None reported or observed

## 2023-03-10 NOTE — Progress Notes (Addendum)
 [facility] MD Progress Note  03/10/2023 9:42 AM Raymond Mcclure  MRN:  [id number] Subjective:  Raymond Mcclure is a 58 y.o. male  with a past psychiatric history of bipolar 1 disorder. Patient initially arrived to [facility] on 1/30 for suicidal ideation in the setting of command auditory hallucinations and substance use (cocaine, cannabis), and admitted to [facility] on 1/31 for acute safety concerns and stabilization of acute on chronic psychiatric conditions. 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   The patient's chart was reviewed and nursing notes were reviewed. Vitals signs: stable. The patient's case was discussed in multidisciplinary team meeting. Per [facility], patient was taking medications appropriately . The following as needed medications were given: tylenol  2x, atarax , advil . Per nursing, patient complains of headache and attended group sessions.   The patient was seen in the group room, no acute distress. On assessment, the patient feels alright today. Patient feels the group sessions have been helpful for him. When asked about speaking with family, he notes speaking to his ex wife regarding one of the bills that he needs to pay.   Patient reports having good sleep, stating he did not need to use trazodone  last night.  Patient reports good appetite. Patient feels that the medications have been helpful with his mood and reports headache that start after breakfast yesterday. He describes it as a bandlike sensation and denies the HA being triggered by an aura. He notes improvement with the HA when he took ibuprofen .    Patient denies current SI, HI, AVH. I discussed the plan of awaiting [facility]'s response regarding the CPAP.   Principal Problem: Bipolar disorder, mixed (HCC) Diagnosis: Active Problems:    Schizoaffective disorder, depressive type (HCC)   Stimulant use disorder   Alcohol use disorder, severe, dependence (HCC)  Total Time spent with patient: 20 minutes  Past Psychiatric History: Current psychiatrist: None Current therapist: denied. Previous psychiatric diagnoses: Schizoaffective disorder, bipolar type Current psychiatric medications: Patient appears to be taking Wellbutrin  and naltrexone , has been off medications for a 2 to 3 weeks (later clarified, within a few months) Psychiatric medication history/compliance: Abilify , naltrexone , Wellbutrin  Psychiatric hospitalization(s): Previously hospitalized 04/2021 at [facility] for suicide attempt (attempting to jump off a parking deck) and substance use. Psychotherapy history: none. Neuromodulation history: none. History of suicide (obtained from HPI): 4 previous suicide attempts, last 2 years ago (OD on fentanyl ) requiring hospitalization History of homicide or aggression (obtained in HPI): Per chart review, patient had thoughts of choking out my grandson who was bothering him.  Reported irritability and agitation in caring for his demented mother.  Per public offender registry, patient was imprisoned from 1999-2005.  Past Medical History:  Past Medical History:  Diagnosis Date   Cluster headaches    Drug abuse (HCC)    Hypertension    Migraines    Obesity    Tachycardia     Past Surgical History:  Procedure Laterality Date   CARDIAC CATHETERIZATION     LOOP RECORDER IMPLANT     WRIST SURGERY     nerve repair   Family History: History reviewed. No pertinent family history. Family Psychiatric  History: Psychiatric diagnoses: Daughter has bipolar disorder.  Cousin has a history of schizophrenia. Suicide history: denied.  Violence/aggression:  Says entire family is violent towards each other. Social History:  Social History   Substance and  Sexual Activity  Alcohol Use Yes   Comment: 1 Gallon/Day     Social History    Substance and Sexual Activity  Drug Use Yes   Types: Crack cocaine, Fentanyl , Cocaine, Marijuana   Comment: Daily Use    Social History   Socioeconomic History   Marital status: Legally Separated    Spouse name: Not on file   Number of children: Not on file   Years of education: Not on file   Highest education level: Not on file  Occupational History   Not on file  Tobacco Use   Smoking status: Some Days    Types: Cigars   Smokeless tobacco: Never   Tobacco comments:    Couple of days .  Smokes when stressed.  Vaping Use   Vaping status: Every Day  Substance and Sexual Activity   Alcohol use: Yes    Comment: 1 Gallon/Day   Drug use: Yes    Types: Crack cocaine, Fentanyl , Cocaine, Marijuana    Comment: Daily Use   Sexual activity: Not Currently    Comment: crack  Other Topics Concern   Not on file  Social History Narrative   Not on file   Social Drivers of Health   Financial Resource Strain: Low Risk  (02/10/2022)   Overall Financial Resource Strain (CARDIA)    Difficulty of Paying Living Expenses: Not hard at all  Food Insecurity: Food Insecurity Present (03/03/2023)   Hunger Vital Sign    Worried About Programme Researcher, Broadcasting/film/video in the Last Year: Often true    Ran Out of Food in the Last Year: Often true  Transportation Needs: Unmet Transportation Needs (03/03/2023)   PRAPARE - Administrator, Civil Service (Medical): Yes    Lack of Transportation (Non-Medical): Yes  Physical Activity: Not on file  Stress: No Stress Concern Present (02/10/2022)   [employer] of Occupational Health - Occupational Stress Questionnaire    Feeling of Stress : Not at all  Social Connections: Not on file   Additional Social History:                         Sleep: Poor  Appetite:  Good  Current Medications: Current Facility-Administered Medications  Medication Dose Route Frequency Provider Last Rate Last Admin   acetaminophen  (TYLENOL ) tablet 650 mg   650 mg Oral Q6H PRN Trudy Carwin, NP   650 mg at 03/09/23 2113   alum & mag hydroxide-simeth (MAALOX/MYLANTA) 200-200-20 MG/5ML suspension 30 mL  30 mL Oral Q4H PRN Trudy Carwin, NP   30 mL at 03/04/23 1948   aspirin  chewable tablet 81 mg  81 mg Oral Daily Massengill, Rankin, MD   81 mg at 03/10/23 0759   atorvastatin  (LIPITOR) tablet 10 mg  10 mg Oral Daily Massengill, Rankin, MD   10 mg at 03/10/23 0759   diltiazem  (CARDIZEM  CD) 24 hr capsule 240 mg  240 mg Oral Daily Massengill, Rankin, MD   240 mg at 03/10/23 0759   escitalopram  (LEXAPRO ) tablet 5 mg  5 mg Oral Daily Reyes Fifield B, MD   5 mg at 03/10/23 0759   famotidine  (PEPCID ) tablet 20 mg  20 mg Oral BID McQuilla, Jai B, MD   20 mg at 03/10/23 0759   hydrOXYzine  (ATARAX ) tablet 25 mg  25 mg Oral TID PRN Trudy Carwin, NP   25 mg at 03/09/23 2113   ibuprofen  (ADVIL ) tablet 200 mg  200 mg Oral Q8H PRN  Shrika Milos B, MD   200 mg at 03/10/23 9189   isosorbide  mononitrate (IMDUR ) 24 hr tablet 30 mg  30 mg Oral Daily Massengill, Rankin, MD   30 mg at 03/10/23 9240   losartan  (COZAAR ) tablet 50 mg  50 mg Oral Daily Massengill, Rankin, MD   50 mg at 03/10/23 9240   magnesium  hydroxide (MILK OF MAGNESIA) suspension 30 mL  30 mL Oral Daily PRN Paytan Recine B, MD       meclizine  (ANTIVERT ) tablet 25 mg  25 mg Oral TID PRN Massengill, Nathan, MD       multivitamin with minerals tablet 1 tablet  1 tablet Oral Daily Massengill, Nathan, MD   1 tablet at 03/10/23 0759   naltrexone  (DEPADE) tablet 50 mg  50 mg Oral QHS Hero Kulish B, MD       nicotine  (NICODERM CQ  - dosed in mg/24 hours) patch 14 mg  14 mg Transdermal Daily Trudy Carwin, NP   14 mg at 03/10/23 0800   OLANZapine  (ZYPREXA ) injection 10 mg  10 mg Intramuscular TID PRN Trudy Carwin, NP       OLANZapine  (ZYPREXA ) injection 5 mg  5 mg Intramuscular TID PRN Trudy Carwin, NP       OLANZapine  zydis (ZYPREXA ) disintegrating tablet 5 mg  5 mg Oral TID PRN Trudy Carwin, NP        polyethylene glycol (MIRALAX  / GLYCOLAX ) packet 17 g  17 g Oral Daily McQuilla, Jai B, MD   17 g at 03/10/23 9240   risperiDONE  (RISPERDAL ) tablet 3 mg  3 mg Oral QHS McQuilla, Jai B, MD   3 mg at 03/09/23 2113   senna (SENOKOT) tablet 8.6 mg  1 tablet Oral QHS PRN Coen Miyasato B, MD       thiamine  (Vitamin B-1) tablet 100 mg  100 mg Oral Daily Massengill, Rankin, MD   100 mg at 03/10/23 0759   traZODone  (DESYREL ) tablet 50 mg  50 mg Oral QHS PRN Trudy Carwin, NP   50 mg at 03/08/23 2106   umeclidinium-vilanterol (ANORO ELLIPTA ) 62.5-25 MCG/ACT 1 puff  1 puff Inhalation Daily Landy Veva CROME, RPH   1 puff at 03/10/23 9240    Lab Results:  No results found for this or any previous visit (from the past 48 hours).    Blood Alcohol level:  Lab Results  Component Value Date   ETH <10 03/03/2023   ETH <10 02/16/2023    Metabolic Disorder Labs: Lab Results  Component Value Date   HGBA1C 5.7 (H) 03/03/2023   MPG 116.89 03/03/2023   Lab Results  Component Value Date   PROLACTIN 31.2 (H) 03/08/2023   Lab Results  Component Value Date   CHOL 159 03/03/2023   TRIG 65 03/03/2023   HDL 39 (L) 03/03/2023   CHOLHDL 4.1 03/03/2023   VLDL 13 03/03/2023   LDLCALC 107 (H) 03/03/2023   LDLCALC (H) 07/15/2006    102        Total Cholesterol/HDL:CHD Risk Coronary Heart Disease Risk Table                     Men   Women  1/2 Average Risk   3.4   3.3    Physical Findings: AIMS:  , ,  ,  ,    CIWA:  CIWA-Ar Total: 1 COWS:     Musculoskeletal: Strength & Muscle Tone: within normal limits Gait & Station: normal Patient leans: N/A  Psychiatric Specialty Exam:  Presentation  General Appearance:  Fairly Groomed; Appropriate for Environment  Eye Contact: Fair  Speech: Clear and Coherent; Slow  Speech Volume: Normal  Handedness: Right   Mood and Affect  Mood: Euthymic  Affect: Congruent; Restricted   Thought Process  Thought Processes: Coherent; Linear; Goal  Directed  Descriptions of Associations:Intact  Orientation:Full (Time, Place and Person)  Thought Content:Logical  History of Schizophrenia/Schizoaffective disorder: yes Duration of Psychotic Symptoms:Less than six months  Hallucinations:Hallucinations: None  Ideas of Reference:None  Suicidal Thoughts:Suicidal Thoughts: No  Homicidal Thoughts:Homicidal Thoughts: No   Sensorium  Memory: Remote Good  Judgment: Fair  Insight: Fair   Chartered Certified Accountant: Fair  Attention Span: Fair  Recall: [employer] of Knowledge: Fair  Language: Fair   Psychomotor Activity  Psychomotor Activity: Psychomotor Activity: Normal   Assets  Assets: Resilience; Desire for Improvement   Sleep  Sleep: Sleep: Fair    Physical Exam: Physical Exam Constitutional:      Appearance: Normal appearance.  HENT:     Head: Normocephalic and atraumatic.  Pulmonary:     Effort: Pulmonary effort is normal.  Neurological:     Mental Status: He is alert and oriented to person, place, and time.    Review of Systems  Neurological:  Positive for headaches.  Psychiatric/Behavioral:  Negative for suicidal ideas.    Blood pressure 132/83, pulse 94, temperature 98.4 F (36.9 C), temperature source Oral, resp. rate 16, height 5' 10 (1.778 m), weight 113.9 kg, SpO2 96%. Body mass index is 36.01 kg/m.   Treatment Plan Summary: Daily contact with patient to assess and evaluate symptoms and progress in treatment and Medication management    ASSESSMENT:    Diagnoses / Active Problems: Schizoaffective disorder, depressive type Stimulant use disorder, cocaine type Cannabis use disorder PTSD    Patient is improving regarding depression and auditory and visual hallucinations. Patient becomes preoccupied with physical symptoms which has been largely affecting patient's mood here in the hospital. We are coordinating his transition to an inpatient rehab facility as he  would like to continue his substance use management. Regarding his CPAP, Raymond Mcclure does not need to use his CPAP machine while getting substance use treatment. He has not been using a CPAP while in the hospital and has been sleeping well with no issues.  Patient has been accepted to [facility] and likely plan for discharge tomorrow.  PLAN:   Safety and Monitoring: -  VOLUNTARY  admission to inpatient psychiatric unit for safety, stabilization and treatment. - Daily contact with patient to assess and evaluate symptoms and progress in treatment - Patient's case to be discussed in multi-disciplinary team meeting -  Observation Level : q15 minute checks -  Vital signs:  q12 hours -  Precautions: suicide, elopement, and assault   2. Psychiatric Diagnoses and Treatment:     # Schizoaffective disorder, bipolar type, in depressive episode # PTSD - Continue Risperdal  3 mg nightly for psychosis and mood stabilization - Continue Lexapro  5 mg daily for depression  - Increase Naltrexone  to 50 mg qhs for substance cravings - The risks/benefits/side-effects/alternatives to this medication were discussed in detail with the patient and time was given for questions. The patient consents to medication trial.  - Metabolic profile and EKG monitoring obtained while on an atypical antipsychotic  BMI: 36.01 TSH: 0.697 Lipid panel: LDL 107 HbgA1c: 5.7 QTc: 430 - Encouraged patient to participate in unit milieu and in scheduled group therapies  - Short Term Goals: Ability to demonstrate self-control  will improve, Ability to identify and develop effective coping behaviors will improve, and Ability to identify triggers associated with substance abuse/mental health issues will improve - Long Term Goals: Improvement in symptoms so as ready for discharge   Other PRNS:    Other labs reviewed on admission: WBC 12.2 H. CMP WNL.  UDS positive for cocaine and marijuana.               3. Medical Issues Being Addressed:    #Headache -Start Advil  200 mg q8H for moderate pain  #Constipation  -Continue senna 8.6 mg daily PRN at bedtime -Received lactulose  twice dose and relieved his constipation -Milk of Mag 30 mL PRN  -Miralax  17 g daily  # Tobacco Use Disorder  - Nicotine  patch 21mg /24 hours ordered  - Smoking cessation encouraged.   # Hypertension - Restarting home meds including diltiazem  240, Imdur  30, losartan  50 mg   #Hyperlipidemia - Restart Lipitor 10 mg   #Arthrosclerosis #Left vertebral artery, carotid artery stenosis - Restart home aspirin  81 mg   #Obstructive sleep apnea -Encouraged patient to retrieve CPAP from [facility] when able  GERD - Continue famotidine  20 mg twice daily   4. Discharge Planning:    - Estimated discharge date: 5 to 7 days - Social work and case management to assist with discharge planning and identification of hospital follow-up needs prior to discharge. - Discharge concerns: Need to establish a safety plan; medication compliance and effectiveness. - Discharge goals: Going to [facility]   I certify that inpatient services furnished can reasonably be expected to improve the patient's condition.    Ismael KATHEE Franco, MD 03/10/2023, 9:42 AM

## 2023-03-11 DIAGNOSIS — F3160 Bipolar disorder, current episode mixed, unspecified: Secondary | ICD-10-CM | POA: Diagnosis not present

## 2023-03-11 NOTE — Progress Notes (Signed)
   03/11/23 2100  Psych Admission Type (Psych Patients Only)  Admission Status Voluntary  Psychosocial Assessment  Patient Complaints None  Eye Contact Fair  Facial Expression Animated  Affect Appropriate to circumstance  Speech Logical/coherent  Interaction Assertive  Motor Activity Slow  Appearance/Hygiene Unremarkable  Behavior Characteristics Cooperative;Calm  Mood Pleasant  Thought Process  Coherency WDL  Content WDL  Delusions None reported or observed  Perception WDL  Hallucination None reported or observed  Judgment Poor  Confusion None  Danger to Self  Current suicidal ideation? Denies  Description of Suicide Plan None  Agreement Not to Harm Self Yes  Description of Agreement Verbal contract for safety  Danger to Others  Danger to Others None reported or observed

## 2023-03-11 NOTE — Plan of Care (Signed)
  Problem: Education: Goal: Knowledge of [facility] Education information/materials will improve Outcome: Progressing Goal: Emotional status will improve Outcome: Progressing Goal: Mental status will improve Outcome: Progressing Goal: Verbalization of understanding the information provided will improve Outcome: Progressing   Problem: Activity: Goal: Interest or engagement in activities will improve Outcome: Progressing   Problem: Coping: Goal: Ability to verbalize frustrations and anger appropriately will improve Outcome: Progressing Goal: Ability to demonstrate self-control will improve Outcome: Progressing   Problem: Health Behavior/Discharge Planning: Goal: Identification of resources available to assist in meeting health care needs will improve Outcome: Progressing Goal: Compliance with treatment plan for underlying cause of condition will improve Outcome: Progressing

## 2023-03-11 NOTE — Group Note (Signed)
 Date:  03/11/2023 Time:  5:21 PM  Group Topic/Focus:  Dimensions of Wellness:   The focus of this group is to introduce the topic of wellness and discuss the role each dimension of wellness plays in total health.    Participation Level:  Active  Participation Quality:  Appropriate  Affect:  Appropriate  Cognitive:  Appropriate  Insight: Appropriate  Engagement in Group:  Engaged  Modes of Intervention:  Activity and Socialization  Additional Comments:   Pt attended and actively participated in the Social Wellness group. Pt practiced active listening, problem solving and teamwork to complete the social wellness activity.  Addison HERO Daivon Rayos 03/11/2023, 5:21 PM

## 2023-03-11 NOTE — Group Note (Signed)
 Recreation Therapy Group Note   Group Topic:Problem Solving  Group Date: 03/11/2023 Start Time: 0934 End Time: 0956 Facilitators: Annalisa Colonna-McCall, LRT,CTRS Location: [address] Dayroom   Group Topic: Communication, Team Building, Problem Solving  Goal Area(s) Addresses:  Patient will effectively work with peer towards shared goal.  Patient will identify skills used to make activity successful.  Patient will identify how skills used during activity can be used to reach post d/c goals.   Intervention: STEM Activity  Activity: Stage Manager. In teams of 3-5, patients were given 12 plastic drinking straws and an equal length of masking tape. Using the materials provided, patients were asked to build a landing pad to catch a golf ball dropped from approximately 5 feet in the air. All materials were required to be used by the team in their design. LRT facilitated post-activity discussion.  Education: Pharmacist, Community, Scientist, Physiological, Discharge Planning   Education Outcome: Acknowledges education/In group clarification offered/Needs additional education.    Affect/Mood: Appropriate   Participation Level: Engaged   Participation Quality: Independent   Behavior: Appropriate   Speech/Thought Process: Focused   Insight: Good   Judgement: Good   Modes of Intervention: Problem-solving   Patient Response to Interventions:  Engaged   Education Outcome:  In group clarification offered    Clinical Observations/Individualized Feedback: Pt was engaged and shared the lead with peer. Pt exhibited a good sense of humor. Pt along with peer, were successful in completing landing pad.    Plan: Continue to engage patient in RT group sessions 2-3x/week.   Nicky Kras-McCall, LRT,CTRS 03/11/2023 12:56 PM

## 2023-03-11 NOTE — Progress Notes (Signed)
 [facility] MD Progress Note  03/11/2023 8:35 AM Raymond Mcclure  MRN:  [id number] Subjective:  Raymond Mcclure is a 58 y.o. male  with a past psychiatric history of bipolar 1 disorder. Patient initially arrived to [facility] on 1/30 for suicidal ideation in the setting of command auditory hallucinations and substance use (cocaine, cannabis), and admitted to [facility] on 1/31 for acute safety concerns and stabilization of acute on chronic psychiatric conditions. 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   The patient's chart was reviewed and nursing notes were reviewed. Vitals signs: BP 150/89. The patient's case was discussed in multidisciplinary team meeting. Per [facility], patient was taking medications appropriately . The following as needed medications were given:Advil . Per nursing, patient is calm and cooperative and attended group sessions.   The patient was seen in the group room, no acute distress. On assessment, the patient feels good today. Patient feels the group sessions have been helpful and he was able to pay his bills yesterday so he feels accomplished.   Patient reports having good sleep, he denies vivid dreams about using which in turn he says he does not have cravings.  Patient reports good appetite. Patient feels that the medications have been helpful for his mood and denies adverse effects. He does feel congested, denies fever or sore throat.    Patient denies current SI, HI, AVH.    Principal Problem: Bipolar disorder, mixed (HCC) Diagnosis: Active Problems:   Schizoaffective disorder, depressive type (HCC)   Stimulant use disorder   Alcohol use disorder, severe, dependence (HCC)  Total Time spent with patient: 20 minutes  Past Psychiatric History: Current psychiatrist: None Current therapist:  denied. Previous psychiatric diagnoses: Schizoaffective disorder, bipolar type Current psychiatric medications: Patient appears to be taking Wellbutrin  and naltrexone , has been off medications for a 2 to 3 weeks (later clarified, within a few months) Psychiatric medication history/compliance: Abilify , naltrexone , Wellbutrin  Psychiatric hospitalization(s): Previously hospitalized 04/2021 at [facility] for suicide attempt (attempting to jump off a parking deck) and substance use. Psychotherapy history: none. Neuromodulation history: none. History of suicide (obtained from HPI): 4 previous suicide attempts, last 2 years ago (OD on fentanyl ) requiring hospitalization History of homicide or aggression (obtained in HPI): Per chart review, patient had thoughts of choking out my grandson who was bothering him.  Reported irritability and agitation in caring for his demented mother.  Per public offender registry, patient was imprisoned from 1999-2005.  Past Medical History:  Past Medical History:  Diagnosis Date   Cluster headaches    Drug abuse (HCC)    Hypertension    Migraines    Obesity    Tachycardia     Past Surgical History:  Procedure Laterality Date   CARDIAC CATHETERIZATION     LOOP RECORDER IMPLANT     WRIST SURGERY     nerve repair   Family History: History reviewed. No pertinent family history. Family Psychiatric  History: Psychiatric diagnoses: Daughter has bipolar disorder.  Cousin has a history of schizophrenia. Suicide history: denied.  Violence/aggression:  Says entire family is violent towards each other. Social History:  Social History   Substance and Sexual Activity  Alcohol Use Yes   Comment: 1 Gallon/Day     Social History   Substance and Sexual Activity  Drug Use Yes   Types: Crack cocaine, Fentanyl , Cocaine, Marijuana   Comment: Daily  Use    Social History   Socioeconomic History   Marital status: Legally Separated    Spouse name: Not on file    Number of children: Not on file   Years of education: Not on file   Highest education level: Not on file  Occupational History   Not on file  Tobacco Use   Smoking status: Some Days    Types: Cigars   Smokeless tobacco: Never   Tobacco comments:    Couple of days .  Smokes when stressed.  Vaping Use   Vaping status: Every Day  Substance and Sexual Activity   Alcohol use: Yes    Comment: 1 Gallon/Day   Drug use: Yes    Types: Crack cocaine, Fentanyl , Cocaine, Marijuana    Comment: Daily Use   Sexual activity: Not Currently    Comment: crack  Other Topics Concern   Not on file  Social History Narrative   Not on file   Social Drivers of Health   Financial Resource Strain: Low Risk  (02/10/2022)   Overall Financial Resource Strain (CARDIA)    Difficulty of Paying Living Expenses: Not hard at all  Food Insecurity: Food Insecurity Present (03/03/2023)   Hunger Vital Sign    Worried About Programme Researcher, Broadcasting/film/video in the Last Year: Often true    Ran Out of Food in the Last Year: Often true  Transportation Needs: Unmet Transportation Needs (03/03/2023)   PRAPARE - Administrator, Civil Service (Medical): Yes    Lack of Transportation (Non-Medical): Yes  Physical Activity: Not on file  Stress: No Stress Concern Present (02/10/2022)   [employer] of Occupational Health - Occupational Stress Questionnaire    Feeling of Stress : Not at all  Social Connections: Not on file    Current Medications: Current Facility-Administered Medications  Medication Dose Route Frequency Provider Last Rate Last Admin   acetaminophen  (TYLENOL ) tablet 650 mg  650 mg Oral Q6H PRN Trudy Carwin, NP   650 mg at 03/09/23 2113   alum & mag hydroxide-simeth (MAALOX/MYLANTA) 200-200-20 MG/5ML suspension 30 mL  30 mL Oral Q4H PRN Trudy Carwin, NP   30 mL at 03/11/23 0754   aspirin  chewable tablet 81 mg  81 mg Oral Daily Massengill, Rankin, MD   81 mg at 03/11/23 0755   atorvastatin  (LIPITOR)  tablet 10 mg  10 mg Oral Daily Massengill, Rankin, MD   10 mg at 03/11/23 0755   diltiazem  (CARDIZEM  CD) 24 hr capsule 240 mg  240 mg Oral Daily Massengill, Nathan, MD   240 mg at 03/11/23 0755   escitalopram  (LEXAPRO ) tablet 5 mg  5 mg Oral Daily Tate Zagal B, MD   5 mg at 03/11/23 0755   famotidine  (PEPCID ) tablet 20 mg  20 mg Oral BID McQuilla, Jai B, MD   20 mg at 03/11/23 0755   hydrOXYzine  (ATARAX ) tablet 25 mg  25 mg Oral TID PRN Trudy Carwin, NP   25 mg at 03/09/23 2113   ibuprofen  (ADVIL ) tablet 200 mg  200 mg Oral Q8H PRN Vennessa Affinito B, MD   200 mg at 03/10/23 9189   isosorbide  mononitrate (IMDUR ) 24 hr tablet 30 mg  30 mg Oral Daily Massengill, Rankin, MD   30 mg at 03/11/23 0755   losartan  (COZAAR ) tablet 50 mg  50 mg Oral Daily Massengill, Rankin, MD   50 mg at 03/11/23 0755   magnesium  hydroxide (MILK OF MAGNESIA) suspension 30 mL  30 mL Oral  Daily PRN Tyechia Allmendinger B, MD       meclizine  (ANTIVERT ) tablet 25 mg  25 mg Oral TID PRN Massengill, Nathan, MD       multivitamin with minerals tablet 1 tablet  1 tablet Oral Daily Massengill, Nathan, MD   1 tablet at 03/11/23 0755   naltrexone  (DEPADE) tablet 50 mg  50 mg Oral QHS Warrene Kapfer B, MD   50 mg at 03/10/23 2121   nicotine  (NICODERM CQ  - dosed in mg/24 hours) patch 14 mg  14 mg Transdermal Daily Trudy Carwin, NP   14 mg at 03/11/23 9243   OLANZapine  (ZYPREXA ) injection 10 mg  10 mg Intramuscular TID PRN Trudy Carwin, NP       OLANZapine  (ZYPREXA ) injection 5 mg  5 mg Intramuscular TID PRN Trudy Carwin, NP       OLANZapine  zydis (ZYPREXA ) disintegrating tablet 5 mg  5 mg Oral TID PRN Trudy Carwin, NP       polyethylene glycol (MIRALAX  / GLYCOLAX ) packet 17 g  17 g Oral Daily McQuilla, Jai B, MD   17 g at 03/10/23 0759   risperiDONE  (RISPERDAL ) tablet 3 mg  3 mg Oral QHS McQuilla, Jai B, MD   3 mg at 03/10/23 2120   senna (SENOKOT) tablet 8.6 mg  1 tablet Oral QHS PRN Lakeith Careaga B, MD       thiamine  (Vitamin B-1)  tablet 100 mg  100 mg Oral Daily Massengill, Nathan, MD   100 mg at 03/11/23 0755   traZODone  (DESYREL ) tablet 50 mg  50 mg Oral QHS PRN Trudy Carwin, NP   50 mg at 03/08/23 2106   umeclidinium-vilanterol (ANORO ELLIPTA ) 62.5-25 MCG/ACT 1 puff  1 puff Inhalation Daily Landy Veva CROME, RPH   1 puff at 03/11/23 9245    Lab Results:  No results found for this or any previous visit (from the past 48 hours).    Blood Alcohol level:  Lab Results  Component Value Date   ETH <10 03/03/2023   ETH <10 02/16/2023    Metabolic Disorder Labs: Lab Results  Component Value Date   HGBA1C 5.7 (H) 03/03/2023   MPG 116.89 03/03/2023   Lab Results  Component Value Date   PROLACTIN 31.2 (H) 03/08/2023   Lab Results  Component Value Date   CHOL 159 03/03/2023   TRIG 65 03/03/2023   HDL 39 (L) 03/03/2023   CHOLHDL 4.1 03/03/2023   VLDL 13 03/03/2023   LDLCALC 107 (H) 03/03/2023   LDLCALC (H) 07/15/2006    102        Total Cholesterol/HDL:CHD Risk Coronary Heart Disease Risk Table                     Men   Women  1/2 Average Risk   3.4   3.3    Physical Findings: AIMS:  , ,  ,  ,    CIWA:  CIWA-Ar Total: 0 COWS:     Musculoskeletal: Strength & Muscle Tone: within normal limits Gait & Station: normal Patient leans: N/A  Psychiatric Specialty Exam:   Presentation  General Appearance:  Appropriate for Environment; Casual; Fairly Groomed  Eye Contact: Good  Speech: Clear and Coherent; Normal Rate  Speech Volume: Normal  Handedness: Right   Mood and Affect  Mood: Euthymic  Affect: Congruent; Appropriate; Full Range   Thought Process  Thought Processes: Coherent; Goal Directed; Linear  Descriptions of Associations:Intact  Orientation:Full (Time, Place and Person)  Thought Content:Logical  History of Schizophrenia/Schizoaffective disorder: yes Duration of Psychotic Symptoms: >6 months Hallucinations:Hallucinations: None  Ideas of  Reference:None  Suicidal Thoughts:Suicidal Thoughts: No  Homicidal Thoughts:Homicidal Thoughts: No   Sensorium  Memory: Immediate Good; Recent Good; Remote Good  Judgment: Good  Insight: Fair   Art Therapist  Concentration: Good  Attention Span: Good  Recall: Good  Fund of Knowledge: Good  Language: Good   Psychomotor Activity  Psychomotor Activity: Psychomotor Activity: Normal   Assets  Assets: Communication Skills; Desire for Improvement; Resilience   Sleep  Sleep: Sleep: Good    Physical Exam: Physical Exam Constitutional:      Appearance: Normal appearance.  HENT:     Head: Normocephalic and atraumatic.  Pulmonary:     Effort: Pulmonary effort is normal.  Neurological:     Mental Status: He is alert and oriented to person, place, and time.    Review of Systems  Gastrointestinal:  Negative for constipation.  Neurological:  Negative for headaches.  Psychiatric/Behavioral:  Negative for suicidal ideas.    Blood pressure (!) 150/89, pulse 93, temperature 97.8 F (36.6 C), temperature source Oral, resp. rate 16, height 5' 10 (1.778 m), weight 113.9 kg, SpO2 100%. Body mass index is 36.01 kg/m.   Treatment Plan Summary: Daily contact with patient to assess and evaluate symptoms and progress in treatment and Medication management    ASSESSMENT:    Diagnoses / Active Problems: Schizoaffective disorder, depressive type Stimulant use disorder, cocaine type Cannabis use disorder PTSD     PLAN:   Safety and Monitoring: -  VOLUNTARY  admission to inpatient psychiatric unit for safety, stabilization and treatment. - Daily contact with patient to assess and evaluate symptoms and progress in treatment - Patient's case to be discussed in multi-disciplinary team meeting -  Observation Level : q15 minute checks -  Vital signs:  q12 hours -  Precautions: suicide, elopement, and assault   2. Psychiatric Diagnoses and Treatment:      # Schizoaffective disorder, bipolar type, in depressive episode # PTSD - Continue Risperdal  3 mg nightly for psychosis and mood stabilization - Continue Lexapro  5 mg daily for depression  - Continue Naltrexone  50 mg qhs for substance cravings - The risks/benefits/side-effects/alternatives to this medication were discussed in detail with the patient and time was given for questions. The patient consents to medication trial.  - Metabolic profile and EKG monitoring obtained while on an atypical antipsychotic  BMI: 36.01 TSH: 0.697 Lipid panel: LDL 107 HbgA1c: 5.7 QTc: 430 - Encouraged patient to participate in unit milieu and in scheduled group therapies  - Short Term Goals: Ability to demonstrate self-control will improve, Ability to identify and develop effective coping behaviors will improve, and Ability to identify triggers associated with substance abuse/mental health issues will improve - Long Term Goals: Improvement in symptoms so as ready for discharge   Other PRNS:    Other labs reviewed on admission: WBC 12.2 H. CMP WNL.  UDS positive for cocaine and marijuana.               3. Medical Issues Being Addressed:   #Headache -Continue Advil  200 mg q8H for moderate pain  #Constipation  -Continue senna 8.6 mg daily PRN at bedtime -Received lactulose  twice dose and relieved his constipation -Milk of Mag 30 mL PRN  -Miralax  17 g daily  # Tobacco Use Disorder  - Nicotine  patch 21mg /24 hours ordered  - Smoking cessation encouraged.   # Hypertension - Restarting home meds including diltiazem  240, Imdur   30, losartan  50 mg   #Hyperlipidemia - Restart Lipitor 10 mg   #Arthrosclerosis #Left vertebral artery, carotid artery stenosis - Restart home aspirin  81 mg   #Obstructive sleep apnea -Encouraged patient to retrieve CPAP from [facility] when able  GERD - Continue famotidine  20 mg twice daily   4. Discharge Planning:    - Estimated discharge date: 5 to 7 days - Social  work and case management to assist with discharge planning and identification of hospital follow-up needs prior to discharge. - Discharge concerns: Need to establish a safety plan; medication compliance and effectiveness. - Discharge goals: Going to [facility] on 2/10   I certify that inpatient services furnished can reasonably be expected to improve the patient's condition.    Ismael KATHEE Franco, MD 03/11/2023, 8:35 AM

## 2023-03-11 NOTE — Group Note (Signed)
 Date:  03/11/2023 Time:  1:39 PM  Group Topic/Focus:  Goals Group:   The focus of this group is to help patients establish daily goals to achieve during treatment and discuss how the patient can incorporate goal setting into their daily lives to aide in recovery. Orientation:   The focus of this group is to educate the patient on the purpose and policies of crisis stabilization and provide a format to answer questions about their admission.  The group details unit policies and expectations of patients while admitted.    Participation Level:  Active  Participation Quality:  Appropriate  Affect:  Appropriate  Cognitive:  Appropriate  Insight: Appropriate  Engagement in Group:  Engaged  Modes of Intervention:  Discussion, Orientation, and Rapport Building  Additional Comments:   Pt attended and participated in the Orientation and Goals group. Pt attentive and supportive of peers as each inquired about medication, program structure and expectations. Pt personal goal for today is to reduce worry and concern about his daughters wellness.  Raymond Mcclure Addaline Peplinski 03/11/2023, 1:39 PM

## 2023-03-11 NOTE — Group Note (Signed)
 Date:  03/11/2023 Time:  9:29 PM  Group Topic/Focus:  AA Meeting    Participation Level:  Active  Participation Quality:  Appropriate  Affect:  Appropriate  Cognitive:  Appropriate  Insight: Appropriate  Engagement in Group:  Engaged  Modes of Intervention:  Socialization and Support  Additional Comments:  Patient attended AA   Eward Mace 03/11/2023, 9:29 PM

## 2023-03-11 NOTE — Plan of Care (Signed)
  Problem: Activity: Goal: Interest or engagement in activities will improve Outcome: Progressing Goal: Sleeping patterns will improve Outcome: Progressing   Problem: Coping: Goal: Ability to verbalize frustrations and anger appropriately will improve Outcome: Progressing Goal: Ability to demonstrate self-control will improve Outcome: Progressing   Problem: Safety: Goal: Periods of time without injury will increase Outcome: Progressing   Problem: Education: Goal: Mental status will improve Outcome: Progressing

## 2023-03-11 NOTE — Progress Notes (Signed)
   03/11/23 0915  Psych Admission Type (Psych Patients Only)  Admission Status Voluntary  Psychosocial Assessment  Patient Complaints None  Eye Contact Fair  Facial Expression Animated  Affect Appropriate to circumstance  Speech Logical/coherent  Interaction Assertive  Motor Activity Slow  Appearance/Hygiene Unremarkable  Behavior Characteristics Appropriate to situation;Cooperative  Mood Pleasant  Thought Process  Coherency WDL  Content WDL  Delusions None reported or observed  Perception WDL  Hallucination None reported or observed  Judgment Poor  Confusion None  Danger to Self  Current suicidal ideation? Denies  Agreement Not to Harm Self Yes  Description of Agreement Verbal  Danger to Others  Danger to Others None reported or observed

## 2023-03-12 DIAGNOSIS — F3160 Bipolar disorder, current episode mixed, unspecified: Secondary | ICD-10-CM | POA: Diagnosis not present

## 2023-03-12 LAB — RESP PANEL BY RT-PCR (RSV, FLU A&B, COVID)  RVPGX2
Influenza A by PCR: NEGATIVE
Influenza B by PCR: NEGATIVE
Resp Syncytial Virus by PCR: NEGATIVE
SARS Coronavirus 2 by RT PCR: POSITIVE — AB

## 2023-03-12 MED ORDER — ESCITALOPRAM OXALATE 10 MG PO TABS
10.0000 mg | ORAL_TABLET | Freq: Every day | ORAL | Status: DC
  Administered 2023-03-13 – 2023-03-14 (×2): 10 mg via ORAL
  Filled 2023-03-12: qty 1
  Filled 2023-03-12 (×2): qty 21
  Filled 2023-03-12: qty 1

## 2023-03-12 MED ORDER — PSEUDOEPHEDRINE HCL 30 MG PO TABS
60.0000 mg | ORAL_TABLET | Freq: Four times a day (QID) | ORAL | Status: DC | PRN
  Administered 2023-03-13: 60 mg via ORAL
  Filled 2023-03-12: qty 2

## 2023-03-12 MED ORDER — MENTHOL 3 MG MT LOZG
1.0000 | LOZENGE | OROMUCOSAL | Status: DC | PRN
  Administered 2023-03-12 – 2023-03-13 (×3): 3 mg via ORAL
  Filled 2023-03-12 (×2): qty 9

## 2023-03-12 NOTE — [facility] Group Notes (Signed)
 [facility] LCSW Group Therapy Note  Date/Time:  1:30pm- 2:30 pm  Type of Therapy and Topic:  Group Therapy:  Self-Soothing  Participation Level:  Active   Description of Group This process group involved a discussion with and between patients about self-soothing.   The difference between healthy and unhealthy coping skills was described, then examples were elicited from group members for healthy and unhealthy self-soothing techniques.  This then was followed by a discussion about self-soothing, what it is, how important it is, and why we choose the coping techniques we choose.  Participants were encouraged to think about self-soothing as necessary and positive.  Therapeutic Goals Patient will identify and describe one healthy and one unhealthy self-soothing technique they often use Patient will participate in generating ideas about healthy self-soothing options for both during this hospital stay and when they return to the community Patients will be supportive of one another and receive support from others Patients will understand the need all humans have to self-soothe  Summary of Patient Progress:  The patient expressed he sooth himself with the five sense with  Smell: food cooking Taste: food Touch: rocks Sight: [employer]: jazz music   Therapeutic Modalities Brief Solution-Focused Therapy Psychoeducation

## 2023-03-12 NOTE — Progress Notes (Signed)
 CSW was informed patient has covid, CSW called [facility] to verify entering facility with a positive test result.  CSW spoke to [place], [place] share that patient has to test negative before entering facility. [place] share that they will hold his room and also asked to call back on Monday to check on weekday's policy to get a better understanding.

## 2023-03-12 NOTE — Plan of Care (Signed)
  Problem: Education: Goal: Knowledge of [place] [facility] Education information/materials will improve Outcome: Progressing Goal: Emotional status will improve Outcome: Progressing Goal: Mental status will improve Outcome: Progressing Goal: Verbalization of understanding the information provided will improve Outcome: Progressing   Problem: Activity: Goal: Interest or engagement in activities will improve Outcome: Progressing Goal: Sleeping patterns will improve Outcome: Progressing   Problem: Coping: Goal: Ability to verbalize frustrations and anger appropriately will improve Outcome: Progressing Goal: Ability to demonstrate self-control will improve Outcome: Progressing   Problem: Health Behavior/Discharge Planning: Goal: Identification of resources available to assist in meeting health care needs will improve Outcome: Progressing Goal: Compliance with treatment plan for underlying cause of condition will improve Outcome: Progressing   Problem: Physical Regulation: Goal: Ability to maintain clinical measurements within normal limits will improve Outcome: Progressing   Problem: Safety: Goal: Periods of time without injury will increase Outcome: Progressing   Problem: Education: Goal: Knowledge of risk factors and measures for prevention of condition will improve Outcome: Progressing   Problem: Coping: Goal: Psychosocial and spiritual needs will be supported Outcome: Progressing   Problem: Respiratory: Goal: Will maintain a patent airway Outcome: Progressing Goal: Complications related to the disease process, condition or treatment will be avoided or minimized Outcome: Progressing

## 2023-03-12 NOTE — Progress Notes (Signed)
 Pt placed in no roommate room.   Airborne precautions implemented.  Pt awake and alert.  Was given a throat lozenge for sore throat. No further distress noted.

## 2023-03-12 NOTE — Plan of Care (Addendum)
 Saw that patient's respiratory panel came back COVID positive. Pulled patient out of group and informed that he would have to isolate. Placed patient on airborne precautions. Informed charge nurse. Also informed provider taking care of patient's roommate.

## 2023-03-12 NOTE — Group Note (Signed)
 Date:  03/12/2023 Time:  9:23 PM  Group Topic/Focus:  Wrap-Up Group:   The focus of this group is to help patients review their daily goal of treatment and discuss progress on daily workbooks.    Additional Comments:  Pt was unable to attend wrap up group this evening, due to Covid+ precautions.   Raymond Mcclure Pouch 03/12/2023, 9:23 PM

## 2023-03-12 NOTE — Progress Notes (Addendum)
 [facility] MD Progress Note  03/12/2023 6:48 AM Raymond Mcclure  MRN:  [id number] Subjective:  Raymond Mcclure is a 58 y.o. male  with a past psychiatric history of bipolar 1 disorder. Patient initially arrived to [facility] on 1/30 for suicidal ideation in the setting of command auditory hallucinations and substance use (cocaine, cannabis), and admitted to [facility] on 1/31 for acute safety concerns and stabilization of acute on chronic psychiatric conditions. 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   The patient's chart was reviewed and nursing notes were reviewed. Vitals signs: BP 134/83. The patient's case was discussed in multidisciplinary team meeting. Per [facility], patient was taking medications appropriately . The following as needed medications were given: maalox, tylenol . Per nursing, patient attended 4/4 groups.   The patient was seen in the millieu, no acute distress. On assessment, the patient reports sore throat and runny nose that started yesterday. Discussed will obtain respiratory viral panel and give symptomatic treatment (sudafed, lozenge). Otherwise reports regular BM, good appetite. States that his mood is better because he feels more energetic and feels that the meds are helping. Reports initial HA as AE but reports HA have resolved. Denies any additional dreams or lightheadedness. Looking forward to going to [facility] on Monday. Discussed will increase his lexapro  to 10mg  for depressive sx.   Patient denies current SI, HI, AVH.  Principal Problem: Bipolar disorder, mixed (HCC) Diagnosis: Active Problems:   Schizoaffective disorder, depressive type (HCC)   Stimulant use disorder   Alcohol use disorder, severe, dependence (HCC)  Total Time spent with patient: 20 minutes  Past Psychiatric History: Current  psychiatrist: None Current therapist: denied. Previous psychiatric diagnoses: Schizoaffective disorder, bipolar type Current psychiatric medications: Patient appears to be taking Wellbutrin  and naltrexone , has been off medications for a 2 to 3 weeks (later clarified, within a few months) Psychiatric medication history/compliance: Abilify , naltrexone , Wellbutrin  Psychiatric hospitalization(s): Previously hospitalized 04/2021 at [facility] for suicide attempt (attempting to jump off a parking deck) and substance use. Psychotherapy history: none. Neuromodulation history: none. History of suicide (obtained from HPI): 4 previous suicide attempts, last 2 years ago (OD on fentanyl ) requiring hospitalization History of homicide or aggression (obtained in HPI): Per chart review, patient had thoughts of choking out my grandson who was bothering him.  Reported irritability and agitation in caring for his demented mother.  Per public offender registry, patient was imprisoned from 1999-2005.  Past Medical History:  Past Medical History:  Diagnosis Date   Cluster headaches    Drug abuse (HCC)    Hypertension    Migraines    Obesity    Tachycardia     Past Surgical History:  Procedure Laterality Date   CARDIAC CATHETERIZATION     LOOP RECORDER IMPLANT     WRIST SURGERY     nerve repair   Family History: History reviewed. No pertinent family history. Family Psychiatric  History: Psychiatric diagnoses: Daughter has bipolar disorder.  Cousin has a history of schizophrenia. Suicide history: denied.  Violence/aggression:  Says entire family is violent towards each other. Social History:  Social History   Substance and Sexual Activity  Alcohol Use Yes   Comment: 1 Gallon/Day     Social History   Substance and Sexual Activity  Drug Use Yes   Types: Crack cocaine, Fentanyl , Cocaine, Marijuana   Comment: Daily Use  Social History   Socioeconomic History   Marital status: Legally  Separated    Spouse name: Not on file   Number of children: Not on file   Years of education: Not on file   Highest education level: Not on file  Occupational History   Not on file  Tobacco Use   Smoking status: Some Days    Types: Cigars   Smokeless tobacco: Never   Tobacco comments:    Couple of days .  Smokes when stressed.  Vaping Use   Vaping status: Every Day  Substance and Sexual Activity   Alcohol use: Yes    Comment: 1 Gallon/Day   Drug use: Yes    Types: Crack cocaine, Fentanyl , Cocaine, Marijuana    Comment: Daily Use   Sexual activity: Not Currently    Comment: crack  Other Topics Concern   Not on file  Social History Narrative   Not on file   Social Drivers of Health   Financial Resource Strain: Low Risk  (02/10/2022)   Overall Financial Resource Strain (CARDIA)    Difficulty of Paying Living Expenses: Not hard at all  Food Insecurity: Food Insecurity Present (03/03/2023)   Hunger Vital Sign    Worried About Programme Researcher, Broadcasting/film/video in the Last Year: Often true    Ran Out of Food in the Last Year: Often true  Transportation Needs: Unmet Transportation Needs (03/03/2023)   PRAPARE - Administrator, Civil Service (Medical): Yes    Lack of Transportation (Non-Medical): Yes  Physical Activity: Not on file  Stress: No Stress Concern Present (02/10/2022)   [employer] of Occupational Health - Occupational Stress Questionnaire    Feeling of Stress : Not at all  Social Connections: Not on file    Current Medications: Current Facility-Administered Medications  Medication Dose Route Frequency Provider Last Rate Last Admin   acetaminophen  (TYLENOL ) tablet 650 mg  650 mg Oral Q6H PRN Trudy Carwin, NP   650 mg at 03/11/23 1306   alum & mag hydroxide-simeth (MAALOX/MYLANTA) 200-200-20 MG/5ML suspension 30 mL  30 mL Oral Q4H PRN Trudy Carwin, NP   30 mL at 03/11/23 1620   aspirin  chewable tablet 81 mg  81 mg Oral Daily Massengill, Rankin, MD   81 mg at  03/11/23 0755   atorvastatin  (LIPITOR) tablet 10 mg  10 mg Oral Daily Massengill, Nathan, MD   10 mg at 03/11/23 0755   diltiazem  (CARDIZEM  CD) 24 hr capsule 240 mg  240 mg Oral Daily Massengill, Rankin, MD   240 mg at 03/11/23 0755   escitalopram  (LEXAPRO ) tablet 5 mg  5 mg Oral Daily Hoang, Daniela B, MD   5 mg at 03/11/23 0755   famotidine  (PEPCID ) tablet 20 mg  20 mg Oral BID McQuilla, Jai B, MD   20 mg at 03/11/23 1621   hydrOXYzine  (ATARAX ) tablet 25 mg  25 mg Oral TID PRN Trudy Carwin, NP   25 mg at 03/09/23 2113   ibuprofen  (ADVIL ) tablet 200 mg  200 mg Oral Q8H PRN Hoang, Daniela B, MD   200 mg at 03/10/23 9189   isosorbide  mononitrate (IMDUR ) 24 hr tablet 30 mg  30 mg Oral Daily Massengill, Rankin, MD   30 mg at 03/11/23 0755   losartan  (COZAAR ) tablet 50 mg  50 mg Oral Daily Massengill, Rankin, MD   50 mg at 03/11/23 0755   magnesium  hydroxide (MILK OF MAGNESIA) suspension 30 mL  30 mL Oral Daily PRN Hoang, Daniela  B, MD       meclizine  (ANTIVERT ) tablet 25 mg  25 mg Oral TID PRN Massengill, Nathan, MD       multivitamin with minerals tablet 1 tablet  1 tablet Oral Daily Massengill, Nathan, MD   1 tablet at 03/11/23 0755   naltrexone  (DEPADE) tablet 50 mg  50 mg Oral QHS Hoang, Daniela B, MD   50 mg at 03/11/23 2124   nicotine  (NICODERM CQ  - dosed in mg/24 hours) patch 14 mg  14 mg Transdermal Daily Trudy Carwin, NP   14 mg at 03/11/23 0756   OLANZapine  (ZYPREXA ) injection 10 mg  10 mg Intramuscular TID PRN Trudy Carwin, NP       OLANZapine  (ZYPREXA ) injection 5 mg  5 mg Intramuscular TID PRN Trudy Carwin, NP       OLANZapine  zydis (ZYPREXA ) disintegrating tablet 5 mg  5 mg Oral TID PRN Trudy Carwin, NP       polyethylene glycol (MIRALAX  / GLYCOLAX ) packet 17 g  17 g Oral Daily McQuilla, Jai B, MD   17 g at 03/10/23 0759   risperiDONE  (RISPERDAL ) tablet 3 mg  3 mg Oral QHS McQuilla, Jai B, MD   3 mg at 03/11/23 2124   senna (SENOKOT) tablet 8.6 mg  1 tablet Oral QHS PRN Hoang,  Daniela B, MD       thiamine  (Vitamin B-1) tablet 100 mg  100 mg Oral Daily Massengill, Nathan, MD   100 mg at 03/11/23 0755   traZODone  (DESYREL ) tablet 50 mg  50 mg Oral QHS PRN Trudy Carwin, NP   50 mg at 03/08/23 2106   umeclidinium-vilanterol (ANORO ELLIPTA ) 62.5-25 MCG/ACT 1 puff  1 puff Inhalation Daily Landy Veva CROME, RPH   1 puff at 03/11/23 9245    Lab Results:  No results found for this or any previous visit (from the past 48 hours).    Blood Alcohol level:  Lab Results  Component Value Date   ETH <10 03/03/2023   ETH <10 02/16/2023    Metabolic Disorder Labs: Lab Results  Component Value Date   HGBA1C 5.7 (H) 03/03/2023   MPG 116.89 03/03/2023   Lab Results  Component Value Date   PROLACTIN 31.2 (H) 03/08/2023   Lab Results  Component Value Date   CHOL 159 03/03/2023   TRIG 65 03/03/2023   HDL 39 (L) 03/03/2023   CHOLHDL 4.1 03/03/2023   VLDL 13 03/03/2023   LDLCALC 107 (H) 03/03/2023   LDLCALC (H) 07/15/2006    102        Total Cholesterol/HDL:CHD Risk Coronary Heart Disease Risk Table                     Men   Women  1/2 Average Risk   3.4   3.3    Physical Findings: AIMS:  , ,  ,  ,    CIWA:  CIWA-Ar Total: 0 COWS:     No stiffness or cogwheeling on exam.   Musculoskeletal: Strength & Muscle Tone: within normal limits Gait & Station: normal Patient leans: N/A  Psychiatric Specialty Exam:   Presentation  General Appearance:  Appropriate for Environment; Casual; Fairly Groomed  Eye Contact: Good  Speech: Clear and Coherent; Normal Rate  Speech Volume: Normal  Handedness: Right   Mood and Affect  Mood: Better, more energetic  Affect: Congruent; Appropriate; Full Range   Thought Process  Thought Processes: Coherent; Goal Directed; Linear  Descriptions of Associations:Intact  Orientation:Full (Time,  Place and Person)  Thought Content:Logical  History of Schizophrenia/Schizoaffective disorder: yes Duration of  Psychotic Symptoms: >6 months Hallucinations:Hallucinations: None  Ideas of Reference:None  Suicidal Thoughts:Suicidal Thoughts: No  Homicidal Thoughts:Homicidal Thoughts: No   Sensorium  Memory: Immediate Good; Recent Good; Remote Good  Judgment: Good  Insight: Fair   Art Therapist  Concentration: Good  Attention Span: Good  Recall: Good  Fund of Knowledge: Good  Language: Good   Psychomotor Activity  Psychomotor Activity: Psychomotor Activity: Normal   Assets  Assets: Communication Skills; Desire for Improvement; Resilience   Sleep  Sleep: Sleep: Good    Physical Exam: Physical Exam Constitutional:      Appearance: Normal appearance.  HENT:     Head: Normocephalic and atraumatic.  Pulmonary:     Effort: Pulmonary effort is normal.  Neurological:     Mental Status: He is alert and oriented to person, place, and time.    Review of Systems  HENT:  Positive for congestion and sore throat.   Gastrointestinal:  Negative for constipation.  Neurological:  Negative for headaches.  Psychiatric/Behavioral:  Negative for suicidal ideas.    Blood pressure 134/83, pulse 81, temperature 97.7 F (36.5 C), temperature source Oral, resp. rate 18, height 5' 10 (1.778 m), weight 113.9 kg, SpO2 98%. Body mass index is 36.01 kg/m.   Treatment Plan Summary: Daily contact with patient to assess and evaluate symptoms and progress in treatment and Medication management  Patient now reporting sore throat along with stuffy nose. Will obtain viral panel and give patient symptomatic treatment. Patient appears to be improving psychiatrically, will increase his lexapro  for depressive sx.   ASSESSMENT:    Diagnoses / Active Problems: Schizoaffective disorder, depressive type Stimulant use disorder, cocaine type Cannabis use disorder PTSD     PLAN:   Safety and Monitoring: -  VOLUNTARY  admission to inpatient psychiatric unit for safety,  stabilization and treatment. - Daily contact with patient to assess and evaluate symptoms and progress in treatment - Patient's case to be discussed in multi-disciplinary team meeting -  Observation Level : q15 minute checks -  Vital signs:  q12 hours -  Precautions: suicide, elopement, and assault   2. Psychiatric Diagnoses and Treatment:     # Schizoaffective disorder, bipolar type, in depressive episode # PTSD - Continue Risperdal  3 mg nightly for psychosis and mood stabilization - Increase Lexapro  10 mg daily for depression  - Continue Naltrexone  50 mg qhs for substance cravings - The risks/benefits/side-effects/alternatives to this medication were discussed in detail with the patient and time was given for questions. The patient consents to medication trial.  - Metabolic profile and EKG monitoring obtained while on an atypical antipsychotic  BMI: 36.01 TSH: 0.697 Lipid panel: LDL 107 HbgA1c: 5.7 QTc: 430 - Encouraged patient to participate in unit milieu and in scheduled group therapies  - Short Term Goals: Ability to demonstrate self-control will improve, Ability to identify and develop effective coping behaviors will improve, and Ability to identify triggers associated with substance abuse/mental health issues will improve - Long Term Goals: Improvement in symptoms so as ready for discharge   Other PRNS:    Other labs reviewed on admission: WBC 12.2 H. CMP WNL.  UDS positive for cocaine and marijuana.               3. Medical Issues Being Addressed:   #Headache -Continue Advil  200 mg q8H for moderate pain  #Constipation  -Continue senna 8.6 mg daily PRN at bedtime -Received  lactulose  twice dose and relieved his constipation -Milk of Mag 30 mL PRN  -Miralax  17 g daily  # Tobacco Use Disorder  - Nicotine  patch 21mg /24 hours ordered  - Smoking cessation encouraged.   # Hypertension - Restarting home meds including diltiazem  240, Imdur  30, losartan  50 mg    #Hyperlipidemia - Restart Lipitor 10 mg   #Arthrosclerosis #Left vertebral artery, carotid artery stenosis - Restart home aspirin  81 mg   #Obstructive sleep apnea -Encouraged patient to retrieve CPAP from [facility] when able  GERD - Continue famotidine  20 mg twice daily  #Sore throat, congestion -f/u respiratory viral panel -PRN sudafed, throat lozenge   4. Discharge Planning:    - Estimated discharge date: 5 to 7 days - Social work and case management to assist with discharge planning and identification of hospital follow-up needs prior to discharge. - Discharge concerns: Need to establish a safety plan; medication compliance and effectiveness. - Discharge goals: Going to [facility] on 2/10   I certify that inpatient services furnished can reasonably be expected to improve the patient's condition.    Corean Minor, MD, PGY-2 03/12/2023, 6:48 AM

## 2023-03-12 NOTE — [facility] Group Notes (Signed)
 [facility] Group Notes:  (Nursing/MHT/Case Management/Adjunct)  Date:  03/12/2023  Time:  9:19 AM  Type of Therapy:   Goals  Participation Level:  Active  Participation Quality:  Appropriate  Affect:  Appropriate  Cognitive:  Appropriate  Insight:  Appropriate  Engagement in Group:  Engaged  Modes of Intervention:  Discussion, Orientation, and Socialization  Summary of Progress/Problems:  Raymond Mcclure 03/12/2023, 9:19 AM

## 2023-03-12 NOTE — Progress Notes (Signed)
   03/12/23 1600  Psych Admission Type (Psych Patients Only)  Admission Status Voluntary  Psychosocial Assessment  Patient Complaints None  Eye Contact Fair  Facial Expression Animated  Affect Appropriate to circumstance  Speech Logical/coherent  Interaction Assertive  Motor Activity Slow  Appearance/Hygiene Unremarkable  Behavior Characteristics Cooperative;Calm  Mood Pleasant  Thought Process  Coherency WDL  Content WDL  Delusions None reported or observed  Perception WDL  Hallucination None reported or observed  Judgment Poor  Confusion None  Danger to Self  Current suicidal ideation? Denies  Description of Suicide Plan none  Agreement Not to Harm Self Yes  Description of Agreement verbal  Danger to Others  Danger to Others None reported or observed   Pt came to nursing staff this morning around 11 complaining of sore throat. Pt also spoke with provider.  Pt was tested for covid and results were positive.  Pt was instructed to remain in his room with airborne precautions implemented.  SW spoke with [facility] to make them aware.

## 2023-03-12 NOTE — Progress Notes (Signed)
   03/12/23 2030  Psych Admission Type (Psych Patients Only)  Admission Status Voluntary  Psychosocial Assessment  Patient Complaints Sadness (Pt presents with some sadness but trying to be optimistic for the future.  He stated, I had a lot of energy earlier but now since I'm positive for covid I feel down.  My discharge is on hold.  I'm managing though.  I have something to read.)  Eye Contact Fair  Facial Expression Other (Comment) (congruent with thought process)  Affect Appropriate to circumstance  Speech Logical/coherent  Interaction Assertive  Motor Activity Slow  Appearance/Hygiene Unremarkable  Behavior Characteristics Cooperative;Appropriate to situation;Calm  Mood Pleasant;Euthymic  Thought Process  Coherency WDL  Content WDL  Delusions None reported or observed  Perception WDL  Hallucination None reported or observed  Judgment Impaired  Confusion None  Danger to Self  Current suicidal ideation? Denies  Agreement Not to Harm Self Yes  Description of Agreement Verbal  Danger to Others  Danger to Others None reported or observed

## 2023-03-12 NOTE — Plan of Care (Signed)
  Problem: Health Behavior/Discharge Planning: Goal: Compliance with treatment plan for underlying cause of condition will improve Outcome: Progressing   Problem: Physical Regulation: Goal: Ability to maintain clinical measurements within normal limits will improve Outcome: Progressing   Problem: Education: Goal: Knowledge of risk factors and measures for prevention of condition will improve Outcome: Progressing

## 2023-03-12 NOTE — [facility] Group Notes (Signed)
 Pt participated in group activity

## 2023-03-13 DIAGNOSIS — F3160 Bipolar disorder, current episode mixed, unspecified: Secondary | ICD-10-CM | POA: Diagnosis not present

## 2023-03-13 NOTE — Progress Notes (Signed)
 [facility] MD Progress Note  03/13/2023 6:40 AM Raymond Mcclure  MRN:  [id number] Subjective:  Raymond Mcclure is a 58 y.o. male  with a past psychiatric history of bipolar 1 disorder. Patient initially arrived to [facility] on 1/30 for suicidal ideation in the setting of command auditory hallucinations and substance use (cocaine, cannabis), and admitted to [facility] on 1/31 for acute safety concerns and stabilization of acute on chronic psychiatric conditions. 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   The patient's chart was reviewed and nursing notes were reviewed. Vitals signs: BP 142/86, HR 105. The patient's case was discussed in multidisciplinary team meeting. Per [facility], patient was taking medications appropriately. Patient took PRN cepacol, tylenol . Per nursing, patient attended all groups prior to positive COVID test. [facility] informed CSW that patient has to test negative before entering facility.  The patient was seen in his room, no acute distress. He reports he is doing okay, feel throat not as sore but still reporting stuffy nose. He reports he got dizzy with walking a little. Encouraged to drink fluids. He reports he slept well. He reports no changes in appetite. Reports normal BM. Denies AE from medications. Reports his mood is okay. Denies SI/HI/AVH. Makes joke that he wishes he was having hallucinations so he could have someone to talk to. Reports meds are helping he is having more sleep and less crazy dreams, also less cravings. Reports he lives with elderly mom so can't go back to her but could go back to house in [place] where his wife is. Discussed how [facility] was asking for negative COVID test and we are unsure if they are holding a bed. Discussed SW said that they plan to call tomorrow to obtain finalized plan  from [facility] and can determine disposition from there. He reports he could go back to [facility] tomorrow depending on what the plan is from [place].    Patient denies current SI, HI, AVH.  Principal Problem: Bipolar disorder, mixed (HCC) Diagnosis: Active Problems:   Schizoaffective disorder, depressive type (HCC)   Stimulant use disorder   Alcohol use disorder, severe, dependence (HCC)  Total Time spent with patient: 20 minutes  Past Psychiatric History: Current psychiatrist: None Current therapist: denied. Previous psychiatric diagnoses: Schizoaffective disorder, bipolar type Current psychiatric medications: Patient appears to be taking Wellbutrin  and naltrexone , has been off medications for a 2 to 3 weeks (later clarified, within a few months) Psychiatric medication history/compliance: Abilify , naltrexone , Wellbutrin  Psychiatric hospitalization(s): Previously hospitalized 04/2021 at [facility] for suicide attempt (attempting to jump off a parking deck) and substance use. Psychotherapy history: none. Neuromodulation history: none. History of suicide (obtained from HPI): 4 previous suicide attempts, last 2 years ago (OD on fentanyl ) requiring hospitalization History of homicide or aggression (obtained in HPI): Per chart review, patient had thoughts of choking out my grandson who was bothering him.  Reported irritability and agitation in caring for his demented mother.  Per public offender registry, patient was imprisoned from 1999-2005.  Past Medical History:  Past Medical History:  Diagnosis Date   Cluster headaches    Drug abuse (HCC)    Hypertension    Migraines    Obesity    Tachycardia     Past Surgical History:  Procedure Laterality Date   CARDIAC CATHETERIZATION     LOOP RECORDER IMPLANT     WRIST SURGERY  nerve repair   Family History: History reviewed. No pertinent family history. Family Psychiatric  History: Psychiatric diagnoses: Daughter has bipolar disorder.   Cousin has a history of schizophrenia. Suicide history: denied.  Violence/aggression:  Says entire family is violent towards each other. Social History:  Social History   Substance and Sexual Activity  Alcohol Use Yes   Comment: 1 Gallon/Day     Social History   Substance and Sexual Activity  Drug Use Yes   Types: Crack cocaine, Fentanyl , Cocaine, Marijuana   Comment: Daily Use    Social History   Socioeconomic History   Marital status: Legally Separated    Spouse name: Not on file   Number of children: Not on file   Years of education: Not on file   Highest education level: Not on file  Occupational History   Not on file  Tobacco Use   Smoking status: Some Days    Types: Cigars   Smokeless tobacco: Never   Tobacco comments:    Couple of days .  Smokes when stressed.  Vaping Use   Vaping status: Every Day  Substance and Sexual Activity   Alcohol use: Yes    Comment: 1 Gallon/Day   Drug use: Yes    Types: Crack cocaine, Fentanyl , Cocaine, Marijuana    Comment: Daily Use   Sexual activity: Not Currently    Comment: crack  Other Topics Concern   Not on file  Social History Narrative   Not on file   Social Drivers of Health   Financial Resource Strain: Low Risk  (02/10/2022)   Overall Financial Resource Strain (CARDIA)    Difficulty of Paying Living Expenses: Not hard at all  Food Insecurity: Food Insecurity Present (03/03/2023)   Hunger Vital Sign    Worried About Programme Researcher, Broadcasting/film/video in the Last Year: Often true    Ran Out of Food in the Last Year: Often true  Transportation Needs: Unmet Transportation Needs (03/03/2023)   PRAPARE - Administrator, Civil Service (Medical): Yes    Lack of Transportation (Non-Medical): Yes  Physical Activity: Not on file  Stress: No Stress Concern Present (02/10/2022)   [employer] of Occupational Health - Occupational Stress Questionnaire    Feeling of Stress : Not at all  Social Connections: Not on file     Current Medications: Current Facility-Administered Medications  Medication Dose Route Frequency Provider Last Rate Last Admin   acetaminophen  (TYLENOL ) tablet 650 mg  650 mg Oral Q6H PRN Trudy Carwin, NP   650 mg at 03/12/23 2028   alum & mag hydroxide-simeth (MAALOX/MYLANTA) 200-200-20 MG/5ML suspension 30 mL  30 mL Oral Q4H PRN Trudy Carwin, NP   30 mL at 03/11/23 1620   aspirin  chewable tablet 81 mg  81 mg Oral Daily Massengill, Rankin, MD   81 mg at 03/12/23 9185   atorvastatin  (LIPITOR) tablet 10 mg  10 mg Oral Daily Massengill, Rankin, MD   10 mg at 03/12/23 9185   diltiazem  (CARDIZEM  CD) 24 hr capsule 240 mg  240 mg Oral Daily Massengill, Rankin, MD   240 mg at 03/12/23 0816   escitalopram  (LEXAPRO ) tablet 10 mg  10 mg Oral Daily Kassidie Hendriks, MD       famotidine  (PEPCID ) tablet 20 mg  20 mg Oral BID McQuilla, Jai B, MD   20 mg at 03/12/23 1637   hydrOXYzine  (ATARAX ) tablet 25 mg  25 mg Oral TID PRN Trudy Carwin, NP   25 mg at  03/09/23 2113   ibuprofen  (ADVIL ) tablet 200 mg  200 mg Oral Q8H PRN Hoang, Daniela B, MD   200 mg at 03/10/23 9189   isosorbide  mononitrate (IMDUR ) 24 hr tablet 30 mg  30 mg Oral Daily Massengill, Rankin, MD   30 mg at 03/12/23 9185   losartan  (COZAAR ) tablet 50 mg  50 mg Oral Daily Massengill, Rankin, MD   50 mg at 03/12/23 9185   magnesium  hydroxide (MILK OF MAGNESIA) suspension 30 mL  30 mL Oral Daily PRN Hoang, Daniela B, MD       meclizine  (ANTIVERT ) tablet 25 mg  25 mg Oral TID PRN Johny Rankin, MD       menthol -cetylpyridinium (CEPACOL) lozenge 3 mg  1 lozenge Oral PRN Yvetta Drotar, MD   3 mg at 03/12/23 2045   multivitamin with minerals tablet 1 tablet  1 tablet Oral Daily Massengill, Rankin, MD   1 tablet at 03/12/23 9180   naltrexone  (DEPADE) tablet 50 mg  50 mg Oral QHS Hoang, Daniela B, MD   50 mg at 03/12/23 2027   nicotine  (NICODERM CQ  - dosed in mg/24 hours) patch 14 mg  14 mg Transdermal Daily Trudy Carwin, NP   14 mg at  03/12/23 9183   OLANZapine  (ZYPREXA ) injection 10 mg  10 mg Intramuscular TID PRN Trudy Carwin, NP       OLANZapine  (ZYPREXA ) injection 5 mg  5 mg Intramuscular TID PRN Trudy Carwin, NP       OLANZapine  zydis (ZYPREXA ) disintegrating tablet 5 mg  5 mg Oral TID PRN Trudy Carwin, NP       polyethylene glycol (MIRALAX  / GLYCOLAX ) packet 17 g  17 g Oral Daily McQuilla, Jai B, MD   17 g at 03/10/23 0759   pseudoephedrine  (SUDAFED) tablet 60 mg  60 mg Oral Q6H PRN Twain Stenseth, MD       risperiDONE  (RISPERDAL ) tablet 3 mg  3 mg Oral QHS McQuilla, Jai B, MD   3 mg at 03/12/23 2027   senna (SENOKOT) tablet 8.6 mg  1 tablet Oral QHS PRN Hoang, Daniela B, MD       thiamine  (Vitamin B-1) tablet 100 mg  100 mg Oral Daily Massengill, Rankin, MD   100 mg at 03/12/23 9180   traZODone  (DESYREL ) tablet 50 mg  50 mg Oral QHS PRN Trudy Carwin, NP   50 mg at 03/08/23 2106   umeclidinium-vilanterol (ANORO ELLIPTA ) 62.5-25 MCG/ACT 1 puff  1 puff Inhalation Daily Landy Veva CROME, RPH   1 puff at 03/12/23 9182    Lab Results:  Results for orders placed or performed during the hospital encounter of 03/03/23 (from the past 48 hours)  Resp panel by RT-PCR (RSV, Flu A&B, Covid) Anterior Nasal Swab     Status: Abnormal   Collection Time: 03/12/23 10:38 AM   Specimen: Anterior Nasal Swab  Result Value Ref Range   SARS Coronavirus 2 by RT PCR POSITIVE (A) NEGATIVE    Comment: (NOTE) SARS-CoV-2 target nucleic acids are DETECTED.  The SARS-CoV-2 RNA is generally detectable in upper respiratory specimens during the acute phase of infection. Positive results are indicative of the presence of the identified virus, but do not rule out bacterial infection or co-infection with other pathogens not detected by the test. Clinical correlation with patient history and other diagnostic information is necessary to determine patient infection status. The expected result is Negative.  Fact Sheet for  Patients: bloggercourse.com  Fact Sheet for Healthcare Providers: seriousbroker.it  This  test is not yet approved or cleared by the [place]  FDA and  has been authorized for detection and/or diagnosis of SARS-CoV-2 by FDA under an Emergency Use Authorization (EUA).  This EUA will remain in effect (meaning this test can be used) for the duration of  the COVID-19 declaration under Section 564(b)(1) of the A ct, 21 U.S.C. section 360bbb-3(b)(1), unless the authorization is terminated or revoked sooner.     Influenza A by PCR NEGATIVE NEGATIVE   Influenza B by PCR NEGATIVE NEGATIVE    Comment: (NOTE) The Xpert Xpress SARS-CoV-2/FLU/RSV plus assay is intended as an aid in the diagnosis of influenza from Nasopharyngeal swab specimens and should not be used as a sole basis for treatment. Nasal washings and aspirates are unacceptable for Xpert Xpress SARS-CoV-2/FLU/RSV testing.  Fact Sheet for Patients: bloggercourse.com  Fact Sheet for Healthcare Providers: seriousbroker.it  This test is not yet approved or cleared by the [place]  FDA and has been authorized for detection and/or diagnosis of SARS-CoV-2 by FDA under an Emergency Use Authorization (EUA). This EUA will remain in effect (meaning this test can be used) for the duration of the COVID-19 declaration under Section 564(b)(1) of the Act, 21 U.S.C. section 360bbb-3(b)(1), unless the authorization is terminated or revoked.     Resp Syncytial Virus by PCR NEGATIVE NEGATIVE    Comment: (NOTE) Fact Sheet for Patients: bloggercourse.com  Fact Sheet for Healthcare Providers: seriousbroker.it  This test is not yet approved or cleared by the [place]  FDA and has been authorized for detection and/or diagnosis of SARS-CoV-2 by FDA under an Emergency Use Authorization  (EUA). This EUA will remain in effect (meaning this test can be used) for the duration of the COVID-19 declaration under Section 564(b)(1) of the Act, 21 U.S.C. section 360bbb-3(b)(1), unless the authorization is terminated or revoked.  Performed at [facility], [address] [place], [place] [address]       Blood Alcohol level:  Lab Results  Component Value Date   [facility] <10 03/03/2023   ETH <10 02/16/2023    Metabolic Disorder Labs: Lab Results  Component Value Date   HGBA1C 5.7 (H) 03/03/2023   MPG 116.89 03/03/2023   Lab Results  Component Value Date   PROLACTIN 31.2 (H) 03/08/2023   Lab Results  Component Value Date   CHOL 159 03/03/2023   TRIG 65 03/03/2023   HDL 39 (L) 03/03/2023   CHOLHDL 4.1 03/03/2023   VLDL 13 03/03/2023   LDLCALC 107 (H) 03/03/2023   LDLCALC (H) 07/15/2006    102        Total Cholesterol/HDL:CHD Risk Coronary Heart Disease Risk Table                     Men   Women  1/2 Average Risk   3.4   3.3    Physical Findings: AIMS:  , ,  ,  ,    CIWA:  CIWA-Ar Total: 0 COWS:     No stiffness or cogwheeling on exam.   Musculoskeletal: Strength & Muscle Tone: within normal limits Gait & Station: normal Patient leans: N/A  Psychiatric Specialty Exam:   Presentation  General Appearance:  Appropriate for Environment; Casual; Fairly Groomed  Eye Contact: Good  Speech: Clear and Coherent; Normal Rate  Speech Volume: Normal  Handedness: Right   Mood and Affect  Mood: Doing okay  Affect: Congruent; Appropriate; Full Range   Thought Process  Thought Processes: Coherent; Goal Directed; Linear  Descriptions of Associations:Intact  Orientation:Full (Time, Place and Person)  Thought Content:Logical  History of Schizophrenia/Schizoaffective disorder: yes Duration of Psychotic Symptoms: >6 months Hallucinations: None  Ideas of Reference:None  Suicidal Thoughts: None  Homicidal Thoughts:  None   Sensorium  Memory: Immediate Good; Recent Good; Remote Good  Judgment: Good  Insight: Fair   Art Therapist  Concentration: Good  Attention Span: Good  Recall: Good  Fund of Knowledge: Good  Language: Good   Psychomotor Activity  Psychomotor Activity: Normal   Assets  Assets: Communication Skills; Desire for Improvement; Resilience   Sleep  Sleep: Sleep: Good    Physical Exam: Physical Exam Constitutional:      Appearance: Normal appearance.  HENT:     Head: Normocephalic and atraumatic.  Pulmonary:     Effort: Pulmonary effort is normal.  Neurological:     Mental Status: He is alert and oriented to person, place, and time.    Review of Systems  HENT:  Positive for congestion and sore throat.   Gastrointestinal:  Negative for constipation.  Neurological:  Negative for headaches.  Psychiatric/Behavioral:  Negative for suicidal ideas.    Blood pressure (!) 142/86, pulse (!) 105, temperature 98.1 F (36.7 C), temperature source Oral, resp. rate 18, height 5' 10 (1.778 m), weight 113.9 kg, SpO2 98%. Body mass index is 36.01 kg/m.   Treatment Plan Summary: Daily contact with patient to assess and evaluate symptoms and progress in treatment and Medication management  Patient positive for COVID. Disposition pending given patient has to test negative before going to rehab facility per SW. SW discussed plan to call Monday to determine what the policy is. Otherwise patient's mood is stable, patient reports that he can go back to home in [place] and wait there for [facility] depending on what their policy is.   ASSESSMENT:    Diagnoses / Active Problems: Schizoaffective disorder, depressive type Stimulant use disorder, cocaine type Cannabis use disorder PTSD     PLAN:   Safety and Monitoring: -  VOLUNTARY  admission to inpatient psychiatric unit for safety, stabilization and treatment. - Daily contact with patient to assess and  evaluate symptoms and progress in treatment - Patient's case to be discussed in multi-disciplinary team meeting -  Observation Level : q15 minute checks -  Vital signs:  q12 hours -  Precautions: suicide, elopement, and assault   2. Psychiatric Diagnoses and Treatment:     # Schizoaffective disorder, bipolar type, in depressive episode # PTSD - Continue Risperdal  3 mg nightly for psychosis and mood stabilization - Increase Lexapro  10 mg daily for depression  - Continue Naltrexone  50 mg qhs for substance cravings - The risks/benefits/side-effects/alternatives to this medication were discussed in detail with the patient and time was given for questions. The patient consents to medication trial.  - Metabolic profile and EKG monitoring obtained while on an atypical antipsychotic  BMI: 36.01 TSH: 0.697 Lipid panel: LDL 107 HbgA1c: 5.7 QTc: 430 - Encouraged patient to participate in unit milieu and in scheduled group therapies  - Short Term Goals: Ability to demonstrate self-control will improve, Ability to identify and develop effective coping behaviors will improve, and Ability to identify triggers associated with substance abuse/mental health issues will improve - Long Term Goals: Improvement in symptoms so as ready for discharge   Other PRNS:    Other labs reviewed on admission: WBC 12.2 H. CMP WNL.  UDS positive for cocaine and marijuana.  3. Medical Issues Being Addressed:   #Headache -Continue Advil  200 mg q8H for moderate pain  #Constipation  -Continue senna 8.6 mg daily PRN at bedtime -Received lactulose  twice dose and relieved his constipation -Milk of Mag 30 mL PRN  -Miralax  17 g daily  # Tobacco Use Disorder  - Nicotine  patch 21mg /24 hours ordered  - Smoking cessation encouraged.   # Hypertension - Restarting home meds including diltiazem  240, Imdur  30, losartan  50 mg   #Hyperlipidemia - Restart Lipitor 10 mg   #Arthrosclerosis #Left vertebral  artery, carotid artery stenosis - Restart home aspirin  81 mg   #Obstructive sleep apnea -Encouraged patient to retrieve CPAP from [facility] when able  GERD - Continue famotidine  20 mg twice daily  #COVID positive -PRN sudafed, throat lozenge   4. Discharge Planning:    - Estimated discharge date: 5 to 7 days - Social work and case management to assist with discharge planning and identification of hospital follow-up needs prior to discharge. - Discharge concerns: Need to establish a safety plan; medication compliance and effectiveness. - Discharge goals: Pending going to [facility] due to positive COVID, has to test negative prior to being accepted. SW will f/u re weekday policy.   I certify that inpatient services furnished can reasonably be expected to improve the patient's condition.    Corean Minor, MD, PGY-2 03/13/2023, 6:40 AM

## 2023-03-13 NOTE — Group Note (Signed)
 Date:  03/13/2023 Time:  4:28 PM  Group Topic/Focus:  Goals Group:   The focus of this group is to help patients establish daily goals to achieve during treatment and discuss how the patient can incorporate goal setting into their daily lives to aide in recovery. Orientation:   The focus of this group is to educate the patient on the purpose and policies of crisis stabilization and provide a format to answer questions about their admission.  The group details unit policies and expectations of patients while admitted.    Participation Level:  Did Not Attend  Participation Quality:   n/a  Affect:   n/a  Cognitive:   n/a  Insight: None  Engagement in Group:   n/a  Modes of Intervention:   n/a  Additional Comments:   Pt did not attend.   Addison HERO Ana Woodroof 03/13/2023, 4:28 PM

## 2023-03-13 NOTE — Plan of Care (Signed)
 Nurse discussed anxiety, depression and coping skills with patient.

## 2023-03-13 NOTE — Group Note (Signed)
 Date:  03/14/2023 Time:  11:36 AM  Group Topic/Focus:  Emotional Education:   The focus of this group is to discuss what feelings/emotions are, and how they are experienced. Managing Feelings:   The focus of this group is to identify what feelings patients have difficulty handling and develop a plan to handle them in a healthier way upon discharge.    Participation Level:  Did Not Attend  Participation Quality:   n/a  Affect:   n/a  Cognitive:   n/a  Insight: None  Engagement in Group:   n/a  Modes of Intervention:   n/a  Additional Comments:   Pt did not attend.   Addison HERO Kyndall Amero 03/14/2023, 11:36 AM

## 2023-03-13 NOTE — Progress Notes (Signed)
 D:  Patient denied SI and HI, contracts for safety.  Denied A&V hallucinations. A:   Medications administered per MD orders.  Emotional support and encouragement give. R:  Safety maintained with 15 minute checks.

## 2023-03-13 NOTE — [facility] Group Notes (Signed)
 [facility] Group Notes:  (Nursing/MHT/Case Management/Adjunct)  Date:  03/13/2023  Time:  8:37 PM  Type of Therapy:   Wrap-up group  Participation Level:  Did Not Attend  Participation Quality:    Affect:    Cognitive:    Insight:    Engagement in Group:    Modes of Intervention:    Summary of Progress/Problems: Pt unable to attend group due to medical reason.  Grayce LITTIE Essex 03/13/2023, 8:37 PM

## 2023-03-14 DIAGNOSIS — F3160 Bipolar disorder, current episode mixed, unspecified: Secondary | ICD-10-CM | POA: Diagnosis not present

## 2023-03-14 MED ORDER — HYDROXYZINE HCL 25 MG PO TABS: 25.0000 mg | ORAL_TABLET | Freq: Three times a day (TID) | ORAL | 0 refills | Status: DC | PRN

## 2023-03-14 MED ORDER — ATORVASTATIN CALCIUM 10 MG PO TABS: 10.0000 mg | ORAL_TABLET | Freq: Every day | ORAL | 0 refills | Status: DC

## 2023-03-14 MED ORDER — UMECLIDINIUM-VILANTEROL 62.5-25 MCG/ACT IN AEPB: 1.0000 | INHALATION_SPRAY | Freq: Every day | RESPIRATORY_TRACT | 0 refills | Status: DC

## 2023-03-14 MED ORDER — DILTIAZEM HCL ER COATED BEADS 240 MG PO CP24: 240.0000 mg | ORAL_CAPSULE | Freq: Every day | ORAL | 0 refills | Status: DC

## 2023-03-14 MED ORDER — RISPERIDONE 3 MG PO TABS: 3.0000 mg | ORAL_TABLET | Freq: Every day | ORAL | 0 refills | Status: DC

## 2023-03-14 MED ORDER — POLYETHYLENE GLYCOL 3350 17 G PO PACK: 17.0000 g | PACK | Freq: Every day | ORAL | 0 refills | Status: DC

## 2023-03-14 MED ORDER — ESCITALOPRAM OXALATE 10 MG PO TABS: 10.0000 mg | ORAL_TABLET | Freq: Every day | ORAL | 0 refills | Status: DC

## 2023-03-14 MED ORDER — ISOSORBIDE MONONITRATE ER 30 MG PO TB24
30.0000 mg | ORAL_TABLET | Freq: Every day | ORAL | 0 refills | Status: DC
Start: 2023-03-14 — End: 2023-09-10

## 2023-03-14 MED ORDER — NICOTINE 14 MG/24HR TD PT24
14.0000 mg | MEDICATED_PATCH | Freq: Every day | TRANSDERMAL | 0 refills | Status: DC
Start: 2023-03-15 — End: 2023-09-10

## 2023-03-14 MED ORDER — LOSARTAN POTASSIUM 50 MG PO TABS: 50.0000 mg | ORAL_TABLET | Freq: Every day | ORAL | 0 refills | Status: DC

## 2023-03-14 MED ORDER — TRAZODONE HCL 50 MG PO TABS: 50.0000 mg | ORAL_TABLET | Freq: Every evening | ORAL | 0 refills | Status: DC | PRN

## 2023-03-14 MED ORDER — CVS ASPIRIN ADULT LOW DOSE 81 MG PO CHEW: 81.0000 mg | CHEWABLE_TABLET | Freq: Every day | ORAL | 0 refills | Status: DC

## 2023-03-14 MED ORDER — NALTREXONE HCL 50 MG PO TABS: 50.0000 mg | ORAL_TABLET | Freq: Every day | ORAL | 0 refills | Status: DC

## 2023-03-14 NOTE — Progress Notes (Signed)
   03/13/23 2200  Psych Admission Type (Psych Patients Only)  Admission Status Voluntary  Psychosocial Assessment  Patient Complaints Anxiety  Eye Contact Fair  Facial Expression Anxious  Affect Appropriate to circumstance  Speech Logical/coherent  Interaction Assertive  Motor Activity Slow  Appearance/Hygiene Unremarkable  Behavior Characteristics Cooperative;Appropriate to situation  Mood Pleasant  Thought Process  Coherency WDL  Content WDL  Delusions None reported or observed  Perception WDL  Hallucination None reported or observed  Judgment Poor  Confusion None  Danger to Self  Current suicidal ideation? Denies  Agreement Not to Harm Self Yes  Description of Agreement verbal  Danger to Others  Danger to Others None reported or observed

## 2023-03-14 NOTE — Progress Notes (Signed)
 Patient ID: Raymond Mcclure, male   DOB: [birth year], 58 y.o.   MRN: [id number] Discharge instruction, prescription and follow up appointments reviewed with patient, pt verbalized understanding. Pt's belongings returned, pt dc via [address]

## 2023-03-14 NOTE — Plan of Care (Signed)
   Problem: Education: Goal: Mental status will improve Outcome: Progressing Goal: Verbalization of understanding the information provided will improve Outcome: Progressing   Problem: Activity: Goal: Interest or engagement in activities will improve Outcome: Progressing Goal: Sleeping patterns will improve Outcome: Progressing

## 2023-03-14 NOTE — Plan of Care (Signed)
  Problem: Education: Goal: Knowledge of [facility] Education information/materials will improve Outcome: Adequate for Discharge Goal: Emotional status will improve Outcome: Adequate for Discharge Goal: Mental status will improve 03/14/2023 1317 by Media Spikes, RN Outcome: Adequate for Discharge 03/14/2023 0815 by Media Spikes, RN Outcome: Progressing Goal: Verbalization of understanding the information provided will improve 03/14/2023 1317 by Media Spikes, RN Outcome: Adequate for Discharge 03/14/2023 0815 by Media Spikes, RN Outcome: Progressing

## 2023-03-14 NOTE — [facility] Suicide Risk Assessment (Signed)
 [facility] INPATIENT:  Family/Significant Other Suicide Prevention Education  Suicide Prevention Education:  Contact Attempts: Raymond Mcclure (wife) [phone], (name of family member/significant other) has been identified by the patient as the family member/significant other with whom the patient will be residing, and identified as the person(s) who will aid the patient in the event of a mental health crisis.  With written consent from the patient, two attempts were made to provide suicide prevention education, prior to and/or following the patient's discharge.  We were unsuccessful in providing suicide prevention education.  A suicide education pamphlet was given to the patient to share with family/significant other.  Date and time of attempts: 03/13/22 at 0900 and 1013AM.   LVM, will continue to attempt as discharge is now anticipated for this afternoon.   [name] 03/14/2023, 10:14 AM

## 2023-03-14 NOTE — Plan of Care (Signed)
  Problem: Education: Goal: Knowledge of [facility] Education information/materials will improve 03/14/2023 1327 by Media Spikes, RN Outcome: Adequate for Discharge 03/14/2023 1317 by Media Spikes, RN Outcome: Adequate for Discharge Goal: Emotional status will improve 03/14/2023 1327 by Media Spikes, RN Outcome: Adequate for Discharge 03/14/2023 1317 by Media Spikes, RN Outcome: Adequate for Discharge Goal: Mental status will improve 03/14/2023 1327 by Media Spikes, RN Outcome: Adequate for Discharge 03/14/2023 1317 by Media Spikes, RN Outcome: Adequate for Discharge 03/14/2023 0815 by Media Spikes, RN Outcome: Progressing Goal: Verbalization of understanding the information provided will improve 03/14/2023 1327 by Media Spikes, RN Outcome: Adequate for Discharge 03/14/2023 1317 by Media Spikes, RN Outcome: Adequate for Discharge 03/14/2023 0815 by Media Spikes, RN Outcome: Progressing

## 2023-03-14 NOTE — Group Note (Signed)
 Recreation Therapy Group Note   Group Topic:Stress Management  Group Date: 03/14/2023 Start Time: 0935 End Time: 0954 Facilitators: Aubre Quincy-McCall, LRT,CTRS Location: [address] Dayroom   Group Topic: Stress Management   Goal Area(s) Addresses:  Patient will identify positive stress management techniques. Patient will identify benefits of using stress management post d/c.   Intervention: Insight Timer App   Activity: Meditation. LRT played a meditation that focused on reducing stress, anxiety and worry. Patients were to focus on their breathing and allow the meditation to relax and calm them in order to get the full affect of the meditation.     Education:  Stress Management, Discharge Planning.    Education Outcome: Acknowledges Education   Affect/Mood: N/A   Participation Level: Did not attend    Clinical Observations/Individualized Feedback:      Plan: Continue to engage patient in RT group sessions 2-3x/week.   Nerissa Constantin-McCall, LRT,CTRS 03/14/2023 12:34 PM

## 2023-03-14 NOTE — [facility] Suicide Risk Assessment (Signed)
 Suicide Risk Assessment  Discharge Assessment    [facility] Discharge Suicide Risk Assessment   Principal Problem: Bipolar disorder, mixed [facility]) Discharge Diagnoses: Active Problems:   Schizoaffective disorder, depressive type (HCC)   Stimulant use disorder   Alcohol use disorder, severe, dependence (HCC)   Total Time spent with patient: 30 minutes  Raymond Mcclure is a 58 y.o. male with a past psychiatric history of bipolar 1 disorder. Patient initially arrived to [facility] on 1/30 for suicidal ideation in the setting of command auditory hallucinations and substance use (cocaine, cannabis), and admitted to [facility] on 1/31 for acute safety concerns and stabilization of acute on chronic psychiatric conditions. 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   Hospital Course  During the patient's hospitalization, patient had extensive initial psychiatric evaluation, and follow-up psychiatric evaluations every day.   Psychiatric diagnoses provided upon initial assessment: Active Problems:   Schizoaffective disorder, depressive type (HCC)   Stimulant use disorder   Alcohol use disorder, severe, dependence (HCC)    The following medications were managed:   Upon admission, the following medications were changed / started / discontinued: - Begin Risperdal  1 mg nightly for psychosis and mood stabilization.   During the patient's stay, the following medications were changed / started / discontinued, with final adjustments by discharge: -- Started lexapro  5 mg and increased to 10 mg -- Started Naltrexone  25 mg nightly and increased to 50 mg   During the hospitalization, patient had the following lab / imaging / testing abnormalities which require further evaluation / management / treatment: Lipid panel: LDL  107 HbgA1c: 5.7     Patient's care was discussed during the interdisciplinary team meeting every day during the hospitalization.   The patient denies any side effects to prescribed psychiatric medication.   Gradually, patient started adjusting to milieu. The patient was evaluated each day by a clinical provider to ascertain response to treatment. Improvement was noted by the patient's report of decreasing symptoms, improved sleep and appetite, affect, medication tolerance, behavior, and participation in unit programming.  Patient was asked each day to complete a self inventory noting mood, mental status, pain, new symptoms, anxiety and concerns.     Symptoms were reported as significantly decreased or resolved completely by discharge.    On day of discharge, the patient reports that their mood is stable. The patient denied having suicidal thoughts for more than 48 hours prior to discharge.  Patient denies having homicidal thoughts.  Patient denies having auditory hallucinations.  Patient denies any visual hallucinations or other symptoms of psychosis. The patient was motivated to continue taking medication with a goal of continued improvement in mental health.    The patient reports their target psychiatric symptoms of depression and substance use cravings responded well to the psychiatric medications, and the patient reports overall benefit other psychiatric hospitalization. Supportive psychotherapy was provided to the patient. The patient also participated in regular group therapy while hospitalized. Coping skills, problem solving as well as relaxation therapies were also part of the unit programming.   Labs were reviewed with the patient, and abnormal results were discussed with the patient.   The patient is able to verbalize their individual safety plan to this provider.   Behavioral Events: none   Restraints: none   # It is recommended to the patient to continue psychiatric medications  as prescribed, after  discharge from the hospital.     # It is recommended to the patient to follow up with your outpatient psychiatric provider and PCP.   # It was discussed with the patient, the impact of alcohol, drugs, tobacco have been there overall psychiatric and medical wellbeing, and total abstinence from substance use was recommended to the patient.   # Prescriptions provided or sent directly to preferred pharmacy at discharge. Patient agreeable to plan. Given opportunity to ask questions. Appears to feel comfortable with discharge.    # In the event of worsening symptoms, the patient is instructed to call the crisis hotline, 911 and or go to the nearest ED for appropriate evaluation and treatment of symptoms. To follow-up with primary care provider for other medical issues, concerns and or health care needs   # Patient was discharged home with a plan to follow up as noted below.   Physical Findings: AIMS:  , ,  ,  ,    CIWA:  CIWA-Ar Total: 0 COWS:      Mental Status Exam: Psychiatric Status Exam   Presentation  General Appearance: Appropriate for Environment; Casual; Fairly Groomed     Eye Contact:Good     Speech:Clear and Coherent; Normal Rate     Speech Volume:Normal     Handedness:Right     Mood and Affect  Mood:Euthymic     Affect:Congruent; Appropriate; Full Range       Thought Process  Thought Processes:Coherent; Goal Directed; Linear     Duration of Psychotic Symptoms: N/A   Past Diagnosis of Schizophrenia or Psychoactive disorder: No data recorded Descriptions of Associations:Intact     Orientation:Full (Time, Place and Person)     Thought Content:Logical     Hallucinations:Hallucinations: None     Ideas of Reference:None     Suicidal Thoughts:Suicidal Thoughts: No     Homicidal Thoughts:Homicidal Thoughts: No       Sensorium  Memory:Immediate Good; Recent Good; Remote Good     Judgment:Good     Insight:Good        Executive Functions  Concentration:Good     Attention Span:Good     Recall:Good     Fund of Knowledge:Good     Language:Good       Psychomotor Activity  Psychomotor Activity:Psychomotor Activity: Normal       Assets  Assets:Communication Skills; Desire for Improvement; Resilience       Physical Exam  General: Pleasant, well-appearing. No acute distress. Pulmonary: Normal effort. No wheezing or rales. Skin: No obvious rash or lesions. Neuro: A&Ox3.No focal deficit.     Review of Systems  Positive sore throat  Blood pressure 116/77, pulse 86, temperature 98.3 F (36.8 C), temperature source Oral, resp. rate 20, height 5\' 10"  (1.778 m), weight 113.9 kg, SpO2 99%. Body mass index is 36.01 kg/m.  Mental Status Per Nursing Assessment::   On Admission:  NA  Demographic Factors:  Male Loss Factors: Decline in physical health Historical Factors: Impulsivity Risk Reduction Factors:   Living with another person, especially a relative, Positive social support, and Positive coping skills or problem solving skills   Continued Clinical Symptoms:  Depression:   Recent sense of peace/wellbeing Alcohol/Substance Abuse/Dependencies  Cognitive Features That Contribute To Risk:  Closed-mindedness    Suicide Risk:  Mild: There are no identifiable suicide plans, no associated intent, mild dysphoria and related symptoms, good self-control (both objective and subjective assessment), few other risk factors, and identifiable protective factors, including available and accessible social support.  Follow-up Information     [facility] Follow up.   Specialty: Addiction Medicine Why: You have been accepted to this facility for substance use treatment. You will admit to this facility on Monday 03/18/23 at 10:30AM. You will receive medication mangement and therapy. Contact information: [address] [place]  [address] [phone]         [facility] Follow up.   Why: Please call this provider to schedule a hospital follow up appointment, for therapy and medication management outpatient services after discharging from [facility]. Contact information: [address], [place], [place] [address]  Phone: [phone]                Plan Of Care/Follow-up recommendations:  [facility] for 2/14, make sure to bring medications with you when going to [facility]   Activity: as tolerated   Diet: heart healthy   # It is recommended to the patient to continue psychiatric medications as prescribed, after discharge from the hospital.     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   # Prescriptions provided or sent directly to preferred pharmacy at discharge. Patient agreeable to plan. Given opportunity to ask questions. Appears to feel comfortable with discharge.    # In the event of worsening symptoms, the patient is instructed to call the crisis hotline, 911, and or go to the nearest ED for appropriate evaluation and treatment of symptoms. To follow-up with primary care provider for other medical issues, concerns and or health care needs   Joice Nares, MD 03/14/2023, 10:17 AM

## 2023-03-14 NOTE — [facility] Suicide Risk Assessment (Signed)
 [facility] INPATIENT:  Family/Significant Other Suicide Prevention Education  Suicide Prevention Education:  Contact Attempts: Raymond Mcclure (wife) [phone] , (name of family member/significant other) has been identified by the patient as the family member/significant other with whom the patient will be residing, and identified as the person(s) who will aid the patient in the event of a mental health crisis.  With written consent from the patient, two attempts were made to provide suicide prevention education, prior to and/or following the patient's discharge.  We were unsuccessful in providing suicide prevention education.  A suicide education pamphlet was given to the patient to share with family/significant other.  Date and time of attempt: 12:00PM  Unable to reach wife, per MD pt will discharge this afternoon at 2:00PM and are aware of contacted attempts. Safety planning will be completed with pt before discharge.   [name] 03/14/2023, 12:53 PM

## 2023-03-14 NOTE — Plan of Care (Signed)
  Problem: Education: Goal: Knowledge of [place] [facility] Education information/materials will improve 03/14/2023 1327 by Media Spikes, RN Outcome: Adequate for Discharge 03/14/2023 1327 by Media Spikes, RN Outcome: Adequate for Discharge 03/14/2023 1317 by Media Spikes, RN Outcome: Adequate for Discharge Goal: Emotional status will improve 03/14/2023 1327 by Media Spikes, RN Outcome: Adequate for Discharge 03/14/2023 1327 by Media Spikes, RN Outcome: Adequate for Discharge 03/14/2023 1317 by Media Spikes, RN Outcome: Adequate for Discharge Goal: Mental status will improve 03/14/2023 1327 by Media Spikes, RN Outcome: Adequate for Discharge 03/14/2023 1327 by Media Spikes, RN Outcome: Adequate for Discharge 03/14/2023 1317 by Media Spikes, RN Outcome: Adequate for Discharge 03/14/2023 0815 by Media Spikes, RN Outcome: Progressing Goal: Verbalization of understanding the information provided will improve 03/14/2023 1327 by Media Spikes, RN Outcome: Adequate for Discharge 03/14/2023 1327 by Media Spikes, RN Outcome: Adequate for Discharge 03/14/2023 1317 by Media Spikes, RN Outcome: Adequate for Discharge 03/14/2023 0815 by Media Spikes, RN Outcome: Progressing

## 2023-03-14 NOTE — Discharge Summary (Signed)
 Physician Discharge Summary Note Patient:  Raymond Mcclure is an 58 y.o., male MRN:  [id number] DOB:  Dec 22, 1965 Patient phone:  [phone] (home)  Patient address:   [address] [place] [address],  Total Time spent with patient: 30 minutes  Date of Admission:  03/03/2023 Date of Discharge: 03/14/2023  Reason for Admission:  Raymond Mcclure is a 58 y.o. male with a past psychiatric history of bipolar 1 disorder. Patient initially arrived to [facility] on 1/30 for suicidal ideation in the setting of command auditory hallucinations and substance use (cocaine, cannabis), and admitted to [facility] on 1/31 for acute safety concerns and stabilization of acute on chronic psychiatric conditions. 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   Principal Problem: Bipolar disorder, mixed [facility]) Discharge Diagnoses: Active Problems:   Schizoaffective disorder, depressive type (HCC)   Stimulant use disorder   Alcohol use disorder, severe, dependence (HCC)   Past Psychiatric History: Current psychiatrist: None Current therapist: denied. Previous psychiatric diagnoses: Schizoaffective disorder, bipolar type Current psychiatric medications: Patient appears to be taking Wellbutrin  and naltrexone , has been off medications for a 2 to 3 weeks (later clarified, within a few months) Psychiatric medication history/compliance: Abilify , naltrexone , Wellbutrin  Psychiatric hospitalization(s): Previously hospitalized 04/2021 at [facility] for suicide attempt (attempting to jump off a parking deck) and substance use. Psychotherapy history: none. Neuromodulation history: none. History of suicide (obtained from HPI): 4 previous suicide attempts, last 2 years ago (OD on fentanyl ) requiring hospitalization History of  homicide or aggression (obtained in HPI): Per chart review, patient had thoughts of "choking out my grandson" who was bothering him.  Reported irritability and agitation in caring for his demented mother.  Per public offender registry, patient was imprisoned from 1999-2005.   Past Medical History:      Past Medical History:  Diagnosis Date   Cluster headaches     Drug abuse (HCC)     Hypertension     Migraines     Obesity     Tachycardia               Past Surgical History:  Procedure Laterality Date   CARDIAC CATHETERIZATION       LOOP RECORDER IMPLANT       WRIST SURGERY        nerve repair        Family History:  History reviewed. No pertinent family history.     Family Psychiatric  History: Psychiatric diagnoses: Daughter has bipolar disorder.  Cousin has a history of schizophrenia. Suicide history: denied.  Violence/aggression:  Says entire family is violent towards each other. Social History:  Social History        Substance and Sexual Activity  Alcohol Use Yes    Comment: 1 Gallon/Day     Social History        Substance and Sexual Activity  Drug Use Yes   Types: "Crack" cocaine, Fentanyl , Cocaine, Marijuana    Comment: Daily Use    Social History         Socioeconomic History   Marital status: Legally Separated      Spouse name: Not on file   Number of children: Not on file   Years of education: Not on file   Highest education level: Not on file  Occupational History   Not on file  Tobacco Use  Smoking status: Some Days      Types: Cigars   Smokeless tobacco: Never   Tobacco comments:      Couple of days .  Smokes when stressed.  Vaping Use   Vaping status: Every Day  Substance and Sexual Activity   Alcohol use: Yes      Comment: 1 Gallon/Day   Drug use: Yes      Types: "Crack" cocaine, Fentanyl , Cocaine, Marijuana      Comment: Daily Use   Sexual activity: Not Currently      Comment: crack  Other Topics Concern   Not on file   Social History Narrative   Not on file    Hospital Course:    During the patient's hospitalization, patient had extensive initial psychiatric evaluation, and follow-up psychiatric evaluations every day.  Psychiatric diagnoses provided upon initial assessment: Active Problems:   Schizoaffective disorder, depressive type (HCC)   Stimulant use disorder   Alcohol use disorder, severe, dependence (HCC)   The following medications were managed:  Upon admission, the following medications were changed / started / discontinued: - Begin Risperdal  1 mg nightly for psychosis and mood stabilization.  During the patient's stay, the following medications were changed / started / discontinued, with final adjustments by discharge: -- Started lexapro  5 mg and increased to 10 mg -- Started Naltrexone  25 mg nightly and increased to 50 mg  During the hospitalization, patient had the following lab / imaging / testing abnormalities which require further evaluation / management / treatment: Lipid panel: LDL 107 HbgA1c: 5.7   Patient's care was discussed during the interdisciplinary team meeting every day during the hospitalization.  The patient denies any side effects to prescribed psychiatric medication.  Gradually, patient started adjusting to milieu. The patient was evaluated each day by a clinical provider to ascertain response to treatment. Improvement was noted by the patient's report of decreasing symptoms, improved sleep and appetite, affect, medication tolerance, behavior, and participation in unit programming.  Patient was asked each day to complete a self inventory noting mood, mental status, pain, new symptoms, anxiety and concerns.    Symptoms were reported as significantly decreased or resolved completely by discharge.   On day of discharge, the patient reports that their mood is stable. The patient denied having suicidal thoughts for more than 48 hours prior to discharge.  Patient denies  having homicidal thoughts.  Patient denies having auditory hallucinations.  Patient denies any visual hallucinations or other symptoms of psychosis. The patient was motivated to continue taking medication with a goal of continued improvement in mental health.   The patient reports their target psychiatric symptoms of depression and substance use cravings responded well to the psychiatric medications, and the patient reports overall benefit other psychiatric hospitalization. Supportive psychotherapy was provided to the patient. The patient also participated in regular group therapy while hospitalized. Coping skills, problem solving as well as relaxation therapies were also part of the unit programming.  Labs were reviewed with the patient, and abnormal results were discussed with the patient.  The patient is able to verbalize their individual safety plan to this provider.  Behavioral Events: none  Restraints: none  # It is recommended to the patient to continue psychiatric medications as prescribed, after discharge from the hospital.    # It is recommended to the patient to follow up with your outpatient psychiatric provider and PCP.  # It was discussed with the patient, the impact of alcohol, drugs, tobacco have been there overall psychiatric  and medical wellbeing, and total abstinence from substance use was recommended to the patient.  # Prescriptions provided or sent directly to preferred pharmacy at discharge. Patient agreeable to plan. Given opportunity to ask questions. Appears to feel comfortable with discharge.    # In the event of worsening symptoms, the patient is instructed to call the crisis hotline, 911 and or go to the nearest ED for appropriate evaluation and treatment of symptoms. To follow-up with primary care provider for other medical issues, concerns and or health care needs  # Patient was discharged home with a plan to follow up as noted below.  Physical Findings: AIMS:   , ,  ,  ,    CIWA:  CIWA-Ar Total: 0 COWS:     Mental Status Exam: Psychiatric Status Exam  Presentation  General Appearance: Appropriate for Environment; Casual; Fairly Groomed   Eye Contact:Good   Speech:Clear and Coherent; Normal Rate   Speech Volume:Normal   Handedness:Right   Mood and Affect  Mood:Euthymic   Affect:Congruent; Appropriate; Full Range    Thought Process  Thought Processes:Coherent; Goal Directed; Linear   Duration of Psychotic Symptoms: N/A  Past Diagnosis of Schizophrenia or Psychoactive disorder: No data recorded Descriptions of Associations:Intact   Orientation:Full (Time, Place and Person)   Thought Content:Logical   Hallucinations:Hallucinations: None   Ideas of Reference:None   Suicidal Thoughts:Suicidal Thoughts: No   Homicidal Thoughts:Homicidal Thoughts: No    Sensorium  Memory:Immediate Good; Recent Good; Remote Good   Judgment:Good   Insight:Good    Executive Functions  Concentration:Good   Attention Span:Good   Recall:Good   Fund of Knowledge:Good   Language:Good    Psychomotor Activity  Psychomotor Activity:Psychomotor Activity: Normal    Assets  Assets:Communication Skills; Desire for Improvement; Resilience    Physical Exam  General: Pleasant, well-appearing. No acute distress. Pulmonary: Normal effort. No wheezing or rales. Skin: No obvious rash or lesions. Neuro: A&Ox3.No focal deficit.   Review of Systems  Positive sore throat  Blood pressure 116/77, pulse 86, temperature 98.3 F (36.8 C), temperature source Oral, resp. rate 20, height 5\' 10"  (1.778 m), weight 113.9 kg, SpO2 99%. Body mass index is 36.01 kg/m.  Assets  Assets:Communication Skills; Desire for Improvement; Resilience   Social History   Tobacco Use  Smoking Status Some Days   Types: Cigars  Smokeless Tobacco Never  Tobacco Comments   Couple of days .  Smokes when stressed.   Tobacco  Cessation:  A prescription for an FDA-approved tobacco cessation medication provided at discharge  Blood Alcohol level:  Lab Results  Component Value Date   [facility] <10 03/03/2023   ETH <10 02/16/2023    Metabolic Disorder Labs:  Lab Results  Component Value Date   HGBA1C 5.7 (H) 03/03/2023   MPG 116.89 03/03/2023   Lab Results  Component Value Date   PROLACTIN 31.2 (H) 03/08/2023   Lab Results  Component Value Date   CHOL 159 03/03/2023   TRIG 65 03/03/2023   HDL 39 (L) 03/03/2023   CHOLHDL 4.1 03/03/2023   VLDL 13 03/03/2023   LDLCALC 107 (H) 03/03/2023   LDLCALC (H) 07/15/2006    102        Total Cholesterol/HDL:CHD Risk Coronary Heart Disease Risk Table                     Men   Women  1/2 Average Risk   3.4   3.3     Is patient on multiple antipsychotic  therapies at discharge:  No   Has Patient had three or more failed trials of antipsychotic monotherapy by history:  No  Recommended Plan for Multiple Antipsychotic Therapies: NA   Allergies as of 03/14/2023       Reactions   Bee Venom Anaphylaxis   Peanut-containing Drug Products Anaphylaxis   Covid-19 (mrna) Vaccine Other (See Comments)   "Blacked out" after 1st vaccine; after 2nd vaccine, syncope again        Medication List     STOP taking these medications    ARIPiprazole  10 MG tablet Commonly known as: ABILIFY    Bevespi Aerosphere 9-4.8 MCG/ACT Aero Generic drug: Glycopyrrolate-Formoterol   buPROPion  300 MG 24 hr tablet Commonly known as: WELLBUTRIN  XL   meclizine  25 MG tablet Commonly known as: ANTIVERT    omeprazole  20 MG capsule Commonly known as: PRILOSEC   ondansetron  4 MG tablet Commonly known as: Zofran    Vitamin D (Ergocalciferol) 1.25 MG (50000 UNIT) Caps capsule Commonly known as: DRISDOL       TAKE these medications      Indication  atorvastatin  10 MG tablet Commonly known as: LIPITOR Take 1 tablet (10 mg total) by mouth daily.  Indication: High Amount of Fats in  the Blood   [facility] Aspirin  Adult Low Dose 81 MG chewable tablet Generic drug: aspirin  Chew 1 tablet (81 mg total) by mouth daily.  Indication: Disease of the Peripheral Arteries   diltiazem  240 MG 24 hr capsule Commonly known as: CARDIZEM  CD Take 1 capsule (240 mg total) by mouth daily.  Indication: High Blood Pressure   escitalopram  10 MG tablet Commonly known as: LEXAPRO  Take 1 tablet (10 mg total) by mouth daily. Start taking on: March 15, 2023  Indication: Major Depressive Disorder   hydrOXYzine  25 MG tablet Commonly known as: ATARAX  Take 1 tablet (25 mg total) by mouth 3 (three) times daily as needed for anxiety.  Indication: Feeling Anxious   isosorbide  mononitrate 30 MG 24 hr tablet Commonly known as: IMDUR  Take 1 tablet (30 mg total) by mouth daily. What changed: how to take this  Indication: Stable Angina Pectoris   losartan  50 MG tablet Commonly known as: COZAAR  Take 1 tablet (50 mg total) by mouth daily. Start taking on: March 15, 2023 What changed:  medication strength See the new instructions.  Indication: High Blood Pressure   naltrexone  50 MG tablet Commonly known as: DEPADE Take 1 tablet (50 mg total) by mouth at bedtime. What changed: when to take this  Indication: Abuse or Misuse of Alcohol   nicotine  14 mg/24hr patch Commonly known as: NICODERM CQ  - dosed in mg/24 hours Place 1 patch (14 mg total) onto the skin daily. Start taking on: March 15, 2023  Indication: Nicotine  Addiction   polyethylene glycol 17 g packet Commonly known as: MIRALAX  / GLYCOLAX  Take 17 g by mouth daily. Start taking on: March 15, 2023  Indication: Constipation   risperiDONE  3 MG tablet Commonly known as: RISPERDAL  Take 1 tablet (3 mg total) by mouth at bedtime.  Indication: Major Depressive Disorder   traZODone  50 MG tablet Commonly known as: DESYREL  Take 1 tablet (50 mg total) by mouth at bedtime as needed for sleep.  Indication: Trouble Sleeping    umeclidinium-vilanterol 62.5-25 MCG/ACT Aepb Commonly known as: ANORO ELLIPTA  Inhale 1 puff into the lungs daily. Start taking on: March 15, 2023  Indication: Chronic Obstructive Lung Disease        Follow-up Information     [facility] Follow up.   Specialty: Addiction Medicine Why: You have been accepted to this facility for substance use treatment. You will admit to this facility on Monday 03/18/23 at 10:30AM. You will receive medication mangement and therapy. Contact information: [address] [place] [address] [phone]         [facility] Follow up.   Why: Please call this provider to schedule a hospital follow up appointment, for therapy and medication management outpatient services after discharging from [facility]. Contact information: [address], [place], [place] [address]  Phone: [phone]                Discharge recommendations:  [facility] for 2/14, make sure to bring medications with you when going to [facility]  Activity: as tolerated  Diet: heart healthy  # It is recommended to the patient to continue psychiatric medications as prescribed, after discharge from the hospital.     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   # Prescriptions provided or sent directly to preferred pharmacy at discharge. Patient agreeable to plan. Given opportunity to ask questions. Appears to feel comfortable with discharge.    # In the event of worsening symptoms, the patient is instructed to call the crisis hotline, 911, and or go to the nearest ED for appropriate evaluation and treatment of symptoms. To follow-up with primary care provider for other medical issues, concerns and or health care needs  Patient agrees with D/C instructions and plan.   I discussed my assessment, planned testing and intervention for the patient with Dr. Linnie Riches who agrees with my formulated course of action.   Signed: Joice Nares, MD, PGY-2 03/14/2023, 10:07 AM

## 2023-03-14 NOTE — Group Note (Signed)
 Date:  03/14/2023 Time:  11:56 AM  Group Topic/Focus:  Goals Group:   The focus of this group is to help patients establish daily goals to achieve during treatment and discuss how the patient can incorporate goal setting into their daily lives to aide in recovery. Orientation:   The focus of this group is to educate the patient on the purpose and policies of crisis stabilization and provide a format to answer questions about their admission.  The group details unit policies and expectations of patients while admitted.    Participation Level:  Did Not Attend  Participation Quality:   n/a  Affect:   n/a  Cognitive:   n/a  Insight: None  Engagement in Group:   n/a  Modes of Intervention:   n/a  Additional Comments:   Pt did not attend.  Raymond Mcclure 03/14/2023, 11:56 AM

## 2023-03-14 NOTE — Progress Notes (Signed)
   03/14/23 0740  Psych Admission Type (Psych Patients Only)  Admission Status Voluntary  Psychosocial Assessment  Patient Complaints None  Eye Contact Fair  Facial Expression Anxious  Affect Appropriate to circumstance  Speech Logical/coherent  Interaction Assertive  Motor Activity Slow  Appearance/Hygiene Unremarkable  Behavior Characteristics Cooperative;Appropriate to situation  Mood Pleasant  Thought Process  Coherency WDL  Content WDL  Delusions None reported or observed  Perception WDL  Hallucination None reported or observed  Judgment Poor  Confusion None  Danger to Self  Current suicidal ideation? Denies  Agreement Not to Harm Self Yes  Description of Agreement verbal  Danger to Others  Danger to Others None reported or observed

## 2023-03-14 NOTE — Progress Notes (Signed)
 [facility] MD Progress Note  03/14/2023 8:46 AM LAKSH CIANI  MRN:  [id number] Subjective:  Raymond Mcclure is a 58 y.o. male  with a past psychiatric history of bipolar 1 disorder. Patient initially arrived to [facility] on 1/30 for suicidal ideation in the setting of command auditory hallucinations and substance use (cocaine, cannabis), and admitted to [facility] on 1/31 for acute safety concerns and stabilization of acute on chronic psychiatric conditions. 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   The patient's chart was reviewed and nursing notes were reviewed. Vitals signs: stable. The patient's case was discussed in multidisciplinary team meeting. Per [facility], patient refusing miralax , otherwise was taking medications appropriately. Patient took PRN cepacol, atarax , advil , sudafed, trazodone . [facility] informed CSW that patient has to test negative before entering facility.  The patient was seen in his room, no acute distress. On assessment, the patient feels "good" today. Patient feels the group sessions have been good but he has not been able to go recently due to positive COVID. When asked about speaking with family, he notes speaking with his wife and sister. He states his plan is for him to go home to [place] until his appointment in [place] on Friday.   Patient reports having good sleep.  Patient reports good appetite. Patient feels that the medications have been helpful and denies adverse effects. Regarding symptoms of infection, he states only having a sore throat.   Patient denies current SI, HI, AVH.   Principal Problem: Bipolar disorder, mixed (HCC) Diagnosis: Active Problems:   Schizoaffective disorder, depressive type (HCC)   Stimulant use disorder   Alcohol use disorder, severe, dependence (HCC)  Total  Time spent with patient: 20 minutes  Past Psychiatric History: Current psychiatrist: None Current therapist: denied. Previous psychiatric diagnoses: Schizoaffective disorder, bipolar type Current psychiatric medications: Patient appears to be taking Wellbutrin  and naltrexone , has been off medications for a 2 to 3 weeks (later clarified, within a few months) Psychiatric medication history/compliance: Abilify , naltrexone , Wellbutrin  Psychiatric hospitalization(s): Previously hospitalized 04/2021 at [facility] for suicide attempt (attempting to jump off a parking deck) and substance use. Psychotherapy history: none. Neuromodulation history: none. History of suicide (obtained from HPI): 4 previous suicide attempts, last 2 years ago (OD on fentanyl ) requiring hospitalization History of homicide or aggression (obtained in HPI): Per chart review, patient had thoughts of "choking out my grandson" who was bothering him.  Reported irritability and agitation in caring for his demented mother.  Per public offender registry, patient was imprisoned from 1999-2005.  Past Medical History:  Past Medical History:  Diagnosis Date   Cluster headaches    Drug abuse (HCC)    Hypertension    Migraines    Obesity    Tachycardia     Past Surgical History:  Procedure Laterality Date   CARDIAC CATHETERIZATION     LOOP RECORDER IMPLANT     WRIST SURGERY     nerve repair   Family History: History reviewed. No pertinent family history. Family Psychiatric  History: Psychiatric diagnoses: Daughter has bipolar disorder.  Cousin has a history of schizophrenia. Suicide history: denied.  Violence/aggression:  Says entire family is violent towards each other. Social History:  Social History   Substance and Sexual Activity  Alcohol Use Yes   Comment: 1 Gallon/Day     Social History   Substance and Sexual Activity  Drug Use Yes   Types: "Crack" cocaine, Fentanyl , Cocaine, Marijuana   Comment: Daily Use     Social History   Socioeconomic History   Marital status: Legally Separated    Spouse name: Not on file   Number of children: Not on file   Years of education: Not on file   Highest education level: Not on file  Occupational History   Not on file  Tobacco Use   Smoking status: Some Days    Types: Cigars   Smokeless tobacco: Never   Tobacco comments:    Couple of days .  Smokes when stressed.  Vaping Use   Vaping status: Every Day  Substance and Sexual Activity   Alcohol use: Yes    Comment: 1 Gallon/Day   Drug use: Yes    Types: "Crack" cocaine, Fentanyl , Cocaine, Marijuana    Comment: Daily Use   Sexual activity: Not Currently    Comment: crack  Other Topics Concern   Not on file  Social History Narrative   Not on file   Social Drivers of Health   Financial Resource Strain: Low Risk  (02/10/2022)   Overall Financial Resource Strain (CARDIA)    Difficulty of Paying Living Expenses: Not hard at all  Food Insecurity: Food Insecurity Present (03/03/2023)   Hunger Vital Sign    Worried About Running Out of Food in the Last Year: Often true    Ran Out of Food in the Last Year: Often true  Transportation Needs: Unmet Transportation Needs (03/03/2023)   PRAPARE - Administrator, Civil Service (Medical): Yes    Lack of Transportation (Non-Medical): Yes  Physical Activity: Not on file  Stress: No Stress Concern Present (02/10/2022)   [employer] of Occupational Health - Occupational Stress Questionnaire    Feeling of Stress : Not at all  Social Connections: Not on file    Current Medications: Current Facility-Administered Medications  Medication Dose Route Frequency Provider Last Rate Last Admin   acetaminophen  (TYLENOL ) tablet 650 mg  650 mg Oral Q6H PRN Dorthea Gauze, NP   650 mg at 03/12/23 2028   alum & mag hydroxide-simeth (MAALOX/MYLANTA) 200-200-20 MG/5ML suspension 30 mL  30 mL Oral Q4H PRN Dorthea Gauze, NP   30 mL at 03/11/23 1620   aspirin   chewable tablet 81 mg  81 mg Oral Daily Massengill, Elana Grayer, MD   81 mg at 03/14/23 2956   atorvastatin  (LIPITOR) tablet 10 mg  10 mg Oral Daily Massengill, Elana Grayer, MD   10 mg at 03/14/23 2130   diltiazem  (CARDIZEM  CD) 24 hr capsule 240 mg  240 mg Oral Daily Massengill, Elana Grayer, MD   240 mg at 03/14/23 8657   escitalopram  (LEXAPRO ) tablet 10 mg  10 mg Oral Daily Chien, Stephanie, MD   10 mg at 03/14/23 8469   famotidine  (PEPCID ) tablet 20 mg  20 mg Oral BID McQuilla, Jai B, MD   20 mg at 03/14/23 6295   hydrOXYzine  (ATARAX ) tablet 25 mg  25 mg Oral TID PRN Dorthea Gauze, NP   25 mg at 03/13/23 1607   ibuprofen  (ADVIL ) tablet 200 mg  200 mg Oral Q8H PRN Karlin Binion B, MD   200 mg at 03/13/23 1607   isosorbide  mononitrate (IMDUR ) 24 hr tablet 30 mg  30 mg Oral Daily Massengill, Elana Grayer, MD   30 mg at 03/14/23 2841   losartan  (COZAAR ) tablet 50 mg  50 mg Oral Daily Eleanore Grey, MD   50 mg at 03/14/23 [phone]  magnesium  hydroxide (MILK OF MAGNESIA) suspension 30 mL  30 mL Oral Daily PRN Aaliya Maultsby B, MD       meclizine  (ANTIVERT ) tablet 25 mg  25 mg Oral TID PRN Eleanore Grey, MD       menthol -cetylpyridinium (CEPACOL) lozenge 3 mg  1 lozenge Oral PRN Chien, Stephanie, MD   3 mg at 03/13/23 1155   multivitamin with minerals tablet 1 tablet  1 tablet Oral Daily Massengill, Elana Grayer, MD   1 tablet at 03/14/23 4098   naltrexone  (DEPADE) tablet 50 mg  50 mg Oral QHS Jackelin Correia B, MD   50 mg at 03/13/23 2053   nicotine  (NICODERM CQ  - dosed in mg/24 hours) patch 14 mg  14 mg Transdermal Daily Dorthea Gauze, NP   14 mg at 03/14/23 1191   OLANZapine  (ZYPREXA ) injection 10 mg  10 mg Intramuscular TID PRN Dorthea Gauze, NP       OLANZapine  (ZYPREXA ) injection 5 mg  5 mg Intramuscular TID PRN Dorthea Gauze, NP       OLANZapine  zydis (ZYPREXA ) disintegrating tablet 5 mg  5 mg Oral TID PRN Dorthea Gauze, NP       polyethylene glycol (MIRALAX  / GLYCOLAX ) packet 17 g  17 g Oral Daily McQuilla, Jai B,  MD   17 g at 03/14/23 4782   pseudoephedrine  (SUDAFED) tablet 60 mg  60 mg Oral Q6H PRN Chien, Stephanie, MD   60 mg at 03/13/23 0713   risperiDONE  (RISPERDAL ) tablet 3 mg  3 mg Oral QHS McQuilla, Jai B, MD   3 mg at 03/13/23 2053   senna (SENOKOT) tablet 8.6 mg  1 tablet Oral QHS PRN Machell Wirthlin B, MD       thiamine  (Vitamin B-1) tablet 100 mg  100 mg Oral Daily Massengill, Elana Grayer, MD   100 mg at 03/14/23 9562   traZODone  (DESYREL ) tablet 50 mg  50 mg Oral QHS PRN Dorthea Gauze, NP   50 mg at 03/13/23 2053   umeclidinium-vilanterol (ANORO ELLIPTA ) 62.5-25 MCG/ACT 1 puff  1 puff Inhalation Daily Smiley Dung, RPH   1 puff at 03/14/23 1308    Lab Results:  Results for orders placed or performed during the hospital encounter of 03/03/23 (from the past 48 hours)  Resp panel by RT-PCR (RSV, Flu A&B, Covid) Anterior Nasal Swab     Status: Abnormal   Collection Time: 03/12/23 10:38 AM   Specimen: Anterior Nasal Swab  Result Value Ref Range   SARS Coronavirus 2 by RT PCR POSITIVE (A) NEGATIVE    Comment: (NOTE) SARS-CoV-2 target nucleic acids are DETECTED.  The SARS-CoV-2 RNA is generally detectable in upper respiratory specimens during the acute phase of infection. Positive results are indicative of the presence of the identified virus, but do not rule out bacterial infection or co-infection with other pathogens not detected by the test. Clinical correlation with patient history and other diagnostic information is necessary to determine patient infection status. The expected result is Negative.  Fact Sheet for Patients: BloggerCourse.com  Fact Sheet for Healthcare Providers: SeriousBroker.it  This test is not yet approved or cleared by the [place]  FDA and  has been authorized for detection and/or diagnosis of SARS-CoV-2 by FDA under an Emergency Use Authorization (EUA).  This EUA will remain in effect (meaning this test can be  used) for the duration of  the COVID-19 declaration under Section 564(b)(1) of the A ct, 21 U.S.C. section 360bbb-3(b)(1), unless the authorization is terminated or revoked sooner.  Influenza A by PCR NEGATIVE NEGATIVE   Influenza B by PCR NEGATIVE NEGATIVE    Comment: (NOTE) The Xpert Xpress SARS-CoV-2/FLU/RSV plus assay is intended as an aid in the diagnosis of influenza from Nasopharyngeal swab specimens and should not be used as a sole basis for treatment. Nasal washings and aspirates are unacceptable for Xpert Xpress SARS-CoV-2/FLU/RSV testing.  Fact Sheet for Patients: BloggerCourse.com  Fact Sheet for Healthcare Providers: SeriousBroker.it  This test is not yet approved or cleared by the [place]  FDA and has been authorized for detection and/or diagnosis of SARS-CoV-2 by FDA under an Emergency Use Authorization (EUA). This EUA will remain in effect (meaning this test can be used) for the duration of the COVID-19 declaration under Section 564(b)(1) of the Act, 21 U.S.C. section 360bbb-3(b)(1), unless the authorization is terminated or revoked.     Resp Syncytial Virus by PCR NEGATIVE NEGATIVE    Comment: (NOTE) Fact Sheet for Patients: BloggerCourse.com  Fact Sheet for Healthcare Providers: SeriousBroker.it  This test is not yet approved or cleared by the [place]  FDA and has been authorized for detection and/or diagnosis of SARS-CoV-2 by FDA under an Emergency Use Authorization (EUA). This EUA will remain in effect (meaning this test can be used) for the duration of the COVID-19 declaration under Section 564(b)(1) of the Act, 21 U.S.C. section 360bbb-3(b)(1), unless the authorization is terminated or revoked.  Performed at [facility], [address] [place], [place] [address]       Blood Alcohol level:  Lab Results   Component Value Date   [facility] <10 03/03/2023   ETH <10 02/16/2023    Metabolic Disorder Labs: Lab Results  Component Value Date   HGBA1C 5.7 (H) 03/03/2023   MPG 116.89 03/03/2023   Lab Results  Component Value Date   PROLACTIN 31.2 (H) 03/08/2023   Lab Results  Component Value Date   CHOL 159 03/03/2023   TRIG 65 03/03/2023   HDL 39 (L) 03/03/2023   CHOLHDL 4.1 03/03/2023   VLDL 13 03/03/2023   LDLCALC 107 (H) 03/03/2023   LDLCALC (H) 07/15/2006    102        Total Cholesterol/HDL:CHD Risk Coronary Heart Disease Risk Table                     Men   Women  1/2 Average Risk   3.4   3.3    Physical Findings: AIMS:  , ,  ,  ,    CIWA:  CIWA-Ar Total: 0 COWS:     No stiffness or cogwheeling on exam.   Musculoskeletal: Strength & Muscle Tone: within normal limits Gait & Station: normal Patient leans: N/A  Psychiatric Specialty Exam:   Psychiatric Status Exam  Presentation  General Appearance: Appropriate for Environment; Casual; Fairly Groomed   Eye Contact:Good   Speech:Clear and Coherent; Normal Rate   Speech Volume:Normal   Handedness:Right   Mood and Affect  Mood:Euthymic   Affect:Congruent; Appropriate; Full Range    Thought Process  Thought Processes:Coherent; Goal Directed; Linear   Duration of Psychotic Symptoms: <6 months Past Diagnosis of Schizophrenia or Psychoactive disorder: Yes Descriptions of Associations:Intact   Orientation:Full (Time, Place and Person)   Thought Content:Logical   Hallucinations:Hallucinations: None   Ideas of Reference:None   Suicidal Thoughts:Suicidal Thoughts: No   Homicidal Thoughts:Homicidal Thoughts: No    Sensorium  Memory:Immediate Good; Recent Good; Remote Good   Judgment:Good   Insight:Good    Executive Functions  Concentration:Good  Attention Span:Good   Recall:Good   Fund of Knowledge:Good   Language:Good    Psychomotor Activity  Psychomotor  Activity:Psychomotor Activity: Normal    Assets  Assets:Communication Skills; Desire for Improvement; Resilience      Physical Exam: Physical Exam Constitutional:      Appearance: Normal appearance.  HENT:     Head: Normocephalic and atraumatic.  Pulmonary:     Effort: Pulmonary effort is normal.  Neurological:     Mental Status: He is alert and oriented to person, place, and time.    Review of Systems  HENT:  Positive for sore throat.   Gastrointestinal:  Negative for constipation.  Neurological:  Negative for headaches.  Psychiatric/Behavioral:  Negative for suicidal ideas.    Blood pressure 116/77, pulse 86, temperature 98.3 F (36.8 C), temperature source Oral, resp. rate 20, height 5\' 10"  (1.778 m), weight 113.9 kg, SpO2 99%. Body mass index is 36.01 kg/m.   Treatment Plan Summary: Daily contact with patient to assess and evaluate symptoms and progress in treatment and Medication management  Patient positive for COVID. Disposition pending given patient has to test negative before going to rehab facility per SW. SW discussed plan to call Monday to determine what the policy is. Otherwise patient's mood is stable, patient reports that he can go back to home in [place] and wait there for [facility] depending on what their policy is.   ASSESSMENT:    Diagnoses / Active Problems: Schizoaffective disorder, depressive type Stimulant use disorder, cocaine type Cannabis use disorder PTSD     PLAN:   Safety and Monitoring: -  VOLUNTARY  admission to inpatient psychiatric unit for safety, stabilization and treatment. - Daily contact with patient to assess and evaluate symptoms and progress in treatment - Patient's case to be discussed in multi-disciplinary team meeting -  Observation Level : q15 minute checks -  Vital signs:  q12 hours -  Precautions: suicide, elopement, and assault   2. Psychiatric Diagnoses and Treatment:     # Schizoaffective disorder, bipolar type,  in depressive episode # PTSD - Continue Risperdal  3 mg nightly for psychosis and mood stabilization - Continue Lexapro  10 mg daily for depression  - Continue Naltrexone  50 mg qhs for substance cravings - The risks/benefits/side-effects/alternatives to this medication were discussed in detail with the patient and time was given for questions. The patient consents to medication trial.  - Metabolic profile and EKG monitoring obtained while on an atypical antipsychotic  BMI: 36.01 TSH: 0.697 Lipid panel: LDL 107 HbgA1c: 5.7 QTc: 430 - Encouraged patient to participate in unit milieu and in scheduled group therapies  - Short Term Goals: Ability to demonstrate self-control will improve, Ability to identify and develop effective coping behaviors will improve, and Ability to identify triggers associated with substance abuse/mental health issues will improve - Long Term Goals: Improvement in symptoms so as ready for discharge   Other PRNS:    Other labs reviewed on admission: WBC 12.2 H. CMP WNL.  UDS positive for cocaine and marijuana.               3. Medical Issues Being Addressed:   #Headache -Continue Advil  200 mg q8H for moderate pain  #Constipation  -Continue senna 8.6 mg daily PRN at bedtime -Received lactulose  twice dose and relieved his constipation -Milk of Mag 30 mL PRN  -Miralax  17 g daily  # Tobacco Use Disorder  - Nicotine  patch 21mg /24 hours ordered  - Smoking cessation encouraged.   # Hypertension -  Restarting home meds including diltiazem  240, Imdur  30, losartan  50 mg   #Hyperlipidemia - Restart Lipitor 10 mg   #Arthrosclerosis #Left vertebral artery, carotid artery stenosis - Restart home aspirin  81 mg   #Obstructive sleep apnea -Encouraged patient to retrieve CPAP from [facility] when able  GERD - Continue famotidine  20 mg twice daily  #COVID positive -PRN sudafed, throat lozenge   4. Discharge Planning:    - Estimated discharge date: 5 to 7 days -  Social work and case management to assist with discharge planning and identification of hospital follow-up needs prior to discharge. - Discharge concerns: Need to establish a safety plan; medication compliance and effectiveness. - Discharge goals: [facility] will hold his bed for Friday    I certify that inpatient services furnished can reasonably be expected to improve the patient's condition.    Joice Nares, MD, PGY-2 03/14/2023, 8:46 AM

## 2023-03-14 NOTE — Plan of Care (Signed)
  Problem: Education: Goal: Knowledge of [place] [facility] Education information/materials will improve 03/14/2023 1328 by Media Spikes, RN Outcome: Adequate for Discharge 03/14/2023 1327 by Media Spikes, RN Outcome: Adequate for Discharge 03/14/2023 1327 by Media Spikes, RN Outcome: Adequate for Discharge 03/14/2023 1317 by Media Spikes, RN Outcome: Adequate for Discharge Goal: Emotional status will improve 03/14/2023 1328 by Media Spikes, RN Outcome: Adequate for Discharge 03/14/2023 1327 by Media Spikes, RN Outcome: Adequate for Discharge 03/14/2023 1327 by Media Spikes, RN Outcome: Adequate for Discharge 03/14/2023 1317 by Media Spikes, RN Outcome: Adequate for Discharge Goal: Mental status will improve 03/14/2023 1328 by Media Spikes, RN Outcome: Adequate for Discharge 03/14/2023 1327 by Media Spikes, RN Outcome: Adequate for Discharge 03/14/2023 1327 by Media Spikes, RN Outcome: Adequate for Discharge 03/14/2023 1317 by Media Spikes, RN Outcome: Adequate for Discharge 03/14/2023 0815 by Media Spikes, RN Outcome: Progressing Goal: Verbalization of understanding the information provided will improve 03/14/2023 1328 by Media Spikes, RN Outcome: Adequate for Discharge 03/14/2023 1327 by Media Spikes, RN Outcome: Adequate for Discharge 03/14/2023 1327 by Media Spikes, RN Outcome: Adequate for Discharge 03/14/2023 1317 by Media Spikes, RN Outcome: Adequate for Discharge 03/14/2023 0815 by Media Spikes, RN Outcome: Progressing

## 2023-03-14 NOTE — Transportation (Signed)
 03/14/2023  Euel Herring DOB: 1965/12/05 MRN: [id number]   RIDER WAIVER AND RELEASE OF LIABILITY  For the purposes of helping with transportation needs, [facility] partners with outside transportation providers (taxi companies, [place], Catering manager.) to give [place] patients or other approved people the choice of on-demand rides [employer]") to our buildings for non-emergency visits.  By using [employer], I, the person signing this document, on behalf of myself and/or any legal minors (in my care using the [employer]), agree:  Science writer given to me are supplied by independent, outside transportation providers who do not work for, or have any affiliation with, [employer]. [place] is not a transportation company. [place] has no control over the quality or safety of the rides I get using [employer]. [place] has no control over whether any outside ride will happen on time or not. [facility] gives no guarantee on the reliability, quality, safety, or availability on any rides, or that no mistakes will happen. I know and accept that traveling by vehicle (car, truck, SVU, [employer], bus, taxi, etc.) has risks of serious injuries such as disability, being paralyzed, and death. I know and agree the risk of using [employer] is mine alone, and not [employer]. Transport Services are provided "as is" and as are available. The transportation providers are in charge for all inspections and care of the vehicles used to provide these rides. I agree not to take legal action against [facility], its agents, employees, officers, directors, representatives, insurers, attorneys, assigns, successors, subsidiaries, and affiliates at any time for any reasons related directly or indirectly to using [employer]. I also agree not to take legal action against [place] or its affiliates for any injury, death, or damage to property caused by or related to using  [employer]. I have read this Waiver and Release of Liability, and I understand the terms used in it and their legal meaning. This Waiver is freely and voluntarily given with the understanding that my right (or any legal minors) to legal action against [place] relating to [employer] is knowingly given up to use these services.   I attest that I read the Ride Waiver and Release of Liability to Euel Herring, gave Mr. Bergthold the opportunity to ask questions and answered the questions asked (if any). I affirm that Euel Herring then provided consent for assistance with transportation.

## 2023-03-14 NOTE — Progress Notes (Signed)
  [facility] Adult Case Management Discharge Plan :  Will you be returning to the same living situation after discharge:  No. Pt will be returning home with wife at discharge in [place] At discharge, do you have transportation home?: Yes,  CSW arranged [employer] taxi [place] for 2:00PM, pt requesting to take train back home from there Do you have the ability to pay for your medications: Yes,  pt has active [facility] health insurance  Release of information consent forms completed and in the chart;  Patient's signature needed at discharge.  Patient to Follow up at:  Follow-up Information     [facility] Follow up.   Specialty: Addiction Medicine Why: You have been accepted to this facility for substance use treatment. You will admit to this facility on Monday 03/18/23 at 10:30AM. You will receive medication mangement and therapy. Contact information: [address] [place] [address] [phone]         [facility] Follow up.   Why: Please call this provider to schedule a hospital follow up appointment, for therapy and medication management outpatient services after discharging from [facility]. Contact information: [address], [place], [place] [address]  Phone: [phone]                Next level of care provider has access to [facility] Link:no  Safety Planning and Suicide Prevention discussed: Yes,  completed with pt. Unable to reach Pleasant Yap (wife) [phone]  or Baboucarr Singerman, daughter (payee) [phone].     Has patient been referred to the Quitline?: Patient refused referral for treatment  Patient has been referred for addiction treatment: Yes, patient will begin substance use treatment with [facility] on Friday, 03/18/23 at 10:30AM  [name], LCSWA 03/14/2023, 9:56 AM

## 2023-03-14 NOTE — Plan of Care (Signed)
   Problem: Education: Goal: Knowledge of [facility] Education information/materials will improve Outcome: Progressing   Problem: Activity: Goal: Interest or engagement in activities will improve Outcome: Progressing   Problem: Coping: Goal: Ability to verbalize frustrations and anger appropriately will improve Outcome: Progressing

## 2023-03-14 NOTE — [facility] Suicide Risk Assessment (Signed)
 [facility] INPATIENT:  Family/Significant Other Suicide Prevention Education  Suicide Prevention Education:  Education Completed; Raymond Mcclure (wife) [phone] ,  (name of family member/significant other) has been identified by the patient as the family member/significant other with whom the patient will be residing, and identified as the person(s) who will aid the patient in the event of a mental health crisis (suicidal ideations/suicide attempt).  With written consent from the patient, the family member/significant other has been provided the following suicide prevention education, prior to the and/or following the discharge of the patient.  Was able to connect with pt wife, no safety concerns about pt returning home. Wife aware pt will get transportation from the bus depot to [place], states no concerns regarding this either. No firearms or weapons in the home.   The suicide prevention education provided includes the following: Suicide risk factors Suicide prevention and interventions National Suicide Hotline telephone number [facility] assessment telephone number [facility] Emergency Assistance 911 [facility] and/or Residential Mobile Crisis Unit telephone number  Request made of family/significant other to: Remove weapons (e.g., guns, rifles, knives), all items previously/currently identified as safety concern.   Remove drugs/medications (over-the-counter, prescriptions, illicit drugs), all items previously/currently identified as a safety concern.  The family member/significant other verbalizes understanding of the suicide prevention education information provided.  The family member/significant other agrees to remove the items of safety concern listed above.  Raymond Mcclure 03/14/2023, 1:35 PM

## 2023-03-16 ENCOUNTER — Encounter: Payer: Self-pay | Admitting: Student

## 2023-03-30 ENCOUNTER — Institutional Professional Consult (permissible substitution) (INDEPENDENT_AMBULATORY_CARE_PROVIDER_SITE_OTHER): Payer: Medicare Other | Admitting: Otolaryngology

## 2023-05-30 ENCOUNTER — Other Ambulatory Visit (HOSPITAL_COMMUNITY): Payer: Self-pay

## 2023-05-31 ENCOUNTER — Telehealth: Payer: Self-pay | Admitting: Neurology

## 2023-05-31 ENCOUNTER — Ambulatory Visit (INDEPENDENT_AMBULATORY_CARE_PROVIDER_SITE_OTHER): Payer: 59 | Admitting: Neurology

## 2023-05-31 DIAGNOSIS — Z91199 Patient's noncompliance with other medical treatment and regimen due to unspecified reason: Secondary | ICD-10-CM

## 2023-05-31 NOTE — Telephone Encounter (Signed)
 PATIENT NO SHOWED NEW_PATIENT APPOINTMENT. If he does reschedule, please make sure you review the no-show policy with him thank you

## 2023-05-31 NOTE — Progress Notes (Signed)
 PATIENT NO SHOWED NEW_PATIENT APPOINTMENT. Below is pre-charting in case he reschedules. If he does reschedule, please make sure you review the no-show policy with him thank you    02/16/2023: MRI brain CLINICAL DATA:  Neuro deficit, acute, stroke suspected   EXAM: MRI HEAD WITHOUT CONTRAST   TECHNIQUE: Multiplanar, multiecho pulse sequences of the brain and surrounding structures were obtained without intravenous contrast.   COMPARISON:  Same day CT Head.   FINDINGS: Brain: No acute infarction, hemorrhage, hydrocephalus, extra-axial collection or mass lesion.   Vascular: Attenuated left intradural vertebral artery flow void.   Skull and upper cervical spine: Normal marrow signal.   Sinuses/Orbits: Significant paranasal sinus mucosal thickening. No acute orbital findings.   Other: No mastoid effusions.   IMPRESSION: 1. No evidence of acute intracranial abnormality. 2. Attenuated left intradural vertebral artery which could be due to significant stenosis or artifact. Recommend follow-up CTA or MRA to further evaluate. 3. Significant paranasal sinus mucosal thickening.  CTA H&N:  CLINICAL DATA:  Headache and blurred vision to the left eye with intermittent numbness in the left arm.   EXAM: CT ANGIOGRAPHY HEAD AND NECK WITH AND WITHOUT CONTRAST   TECHNIQUE: Multidetector CT imaging of the head and neck was performed using the standard protocol during bolus administration of intravenous contrast. Multiplanar CT image reconstructions and MIPs were obtained to evaluate the vascular anatomy. Carotid stenosis measurements (when applicable) are obtained utilizing NASCET criteria, using the distal internal carotid diameter as the denominator.   RADIATION DOSE REDUCTION: This exam was performed according to the departmental dose-optimization program which includes automated exposure control, adjustment of the mA and/or kV according to patient size and/or use of iterative  reconstruction technique.   CONTRAST:  75mL OMNIPAQUE  IOHEXOL  350 MG/ML SOLN   COMPARISON:  Brain MRI from earlier today   FINDINGS: CTA NECK FINDINGS   Aortic arch: Unremarkable with 3 vessel branching   Right carotid system: Vessels are smoothly contoured and widely patent. ICA looping below the skull base.   Left carotid system: Vessels are smoothly contoured and widely patent without clear atheromatous change. ICA looping below the skull base. The left ICA is smaller than the right due to circle-of-Willis anatomy.   Vertebral arteries: Mild right vertebral artery dominance. The subclavian arteries are smoothly contoured and widely patent. Both vertebral arteries are widely patent in the neck.   Skeleton: Unremarkable   Other neck: Left more than right sinusitis as noted on prior brain MRI.   Upper chest: Clear apical lungs.   Review of the MIP images confirms the above findings   CTA HEAD FINDINGS   Anterior circulation: Atheromatous calcification of the cavernous carotids with 50% mid cavernous stenosis on the left by sagittal reformats. No proximal branch occlusion, flow reducing stenosis, aneurysm, or beading.   Posterior circulation: Prior brain MRI findings likely related to a mixed density plaque involving the left V4 segment causing high-grade stenosis. Robust flow in the right vertebral artery and basilar. No branch occlusion, beading, or aneurysm. Mild to moderate stenosis at the right P2 segment.   Venous sinuses: Unremarkable.   Anatomic variants: As above   Review of the MIP images confirms the above findings   IMPRESSION: 1. No emergent finding. 2. 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 3. 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     Latest Ref Rng & Units 03/03/2023    4:40 PM 02/16/2023    1:16 PM 02/16/2023  1:10 PM  CBC  WBC 4.0 - 10.5 K/uL 12.2   11.6   Hemoglobin 13.0 - 17.0 g/dL  16.1  09.6  04.5   Hematocrit 39.0 - 52.0 % 43.5  41.0  42.0   Platelets 150 - 400 K/uL 198   432       Latest Ref Rng & Units 03/03/2023    4:40 PM 02/16/2023    1:16 PM 02/16/2023    1:10 PM  CMP  Glucose 70 - 99 mg/dL 87  82  84   BUN 6 - 20 mg/dL 11  13  12    Creatinine 0.61 - 1.24 mg/dL 4.09  8.11  9.14   Sodium 135 - 145 mmol/L 139  140  139   Potassium 3.5 - 5.1 mmol/L 4.1  3.9  3.8   Chloride 98 - 111 mmol/L 104  103  104   CO2 22 - 32 mmol/L 22   24   Calcium  8.9 - 10.3 mg/dL 9.8   9.1   Total Protein 6.5 - 8.1 g/dL 7.2   6.8   Total Bilirubin 0.0 - 1.2 mg/dL 1.2   0.5   Alkaline Phos 38 - 126 U/L 93   66   AST 15 - 41 U/L 27   24   ALT 0 - 44 U/L 21   21
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
## 2023-06-10 ENCOUNTER — Ambulatory Visit: Payer: Medicare PPO | Attending: Cardiology | Admitting: Cardiology

## 2023-06-13 ENCOUNTER — Encounter: Payer: Self-pay | Admitting: Cardiology

## 2023-06-14 ENCOUNTER — Encounter: Payer: Self-pay | Admitting: Cardiology
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
## 2023-07-03 IMAGING — DX DG CHEST 2V
2 series · 2 of 2 positions shown · non-contrast
Comparison: Chest x-ray 07/24/2014

CLINICAL DATA: Chest pain.

EXAM:
CHEST - 2 VIEW

[chest pa]
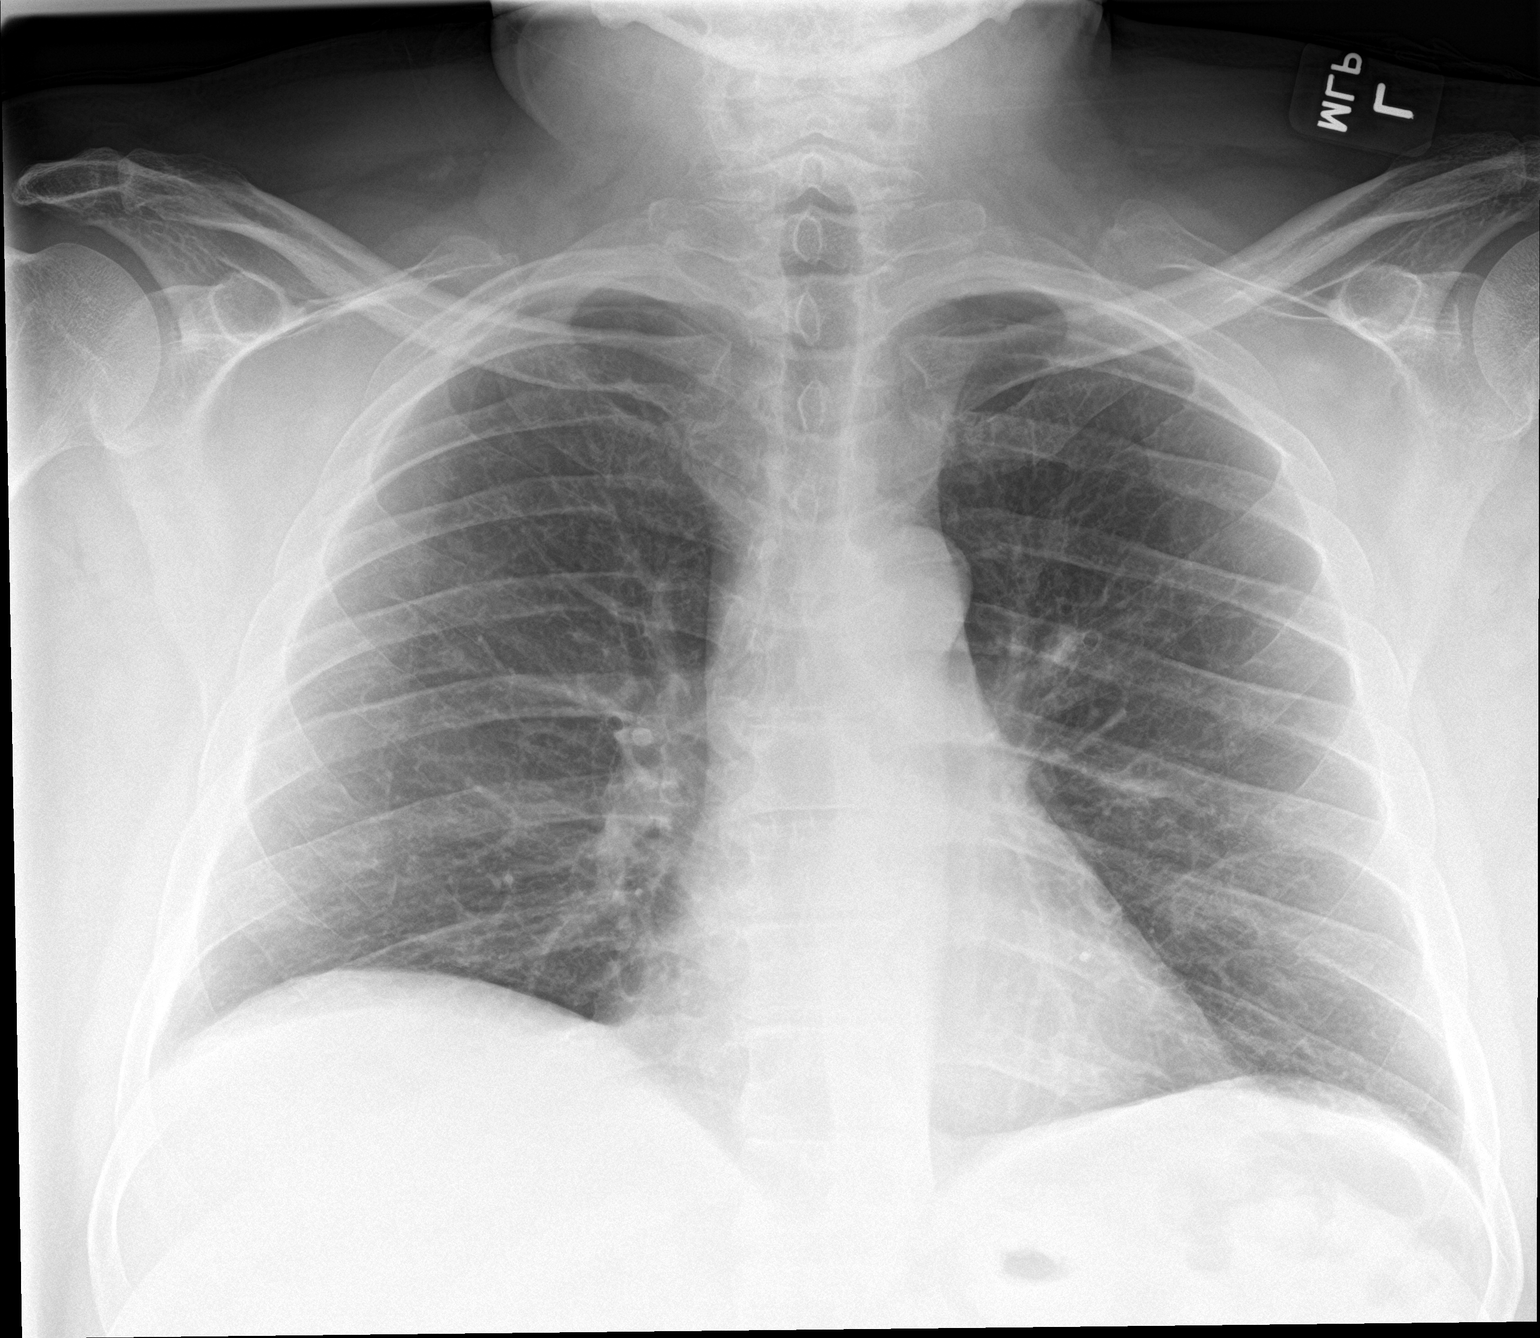

[chest lat]
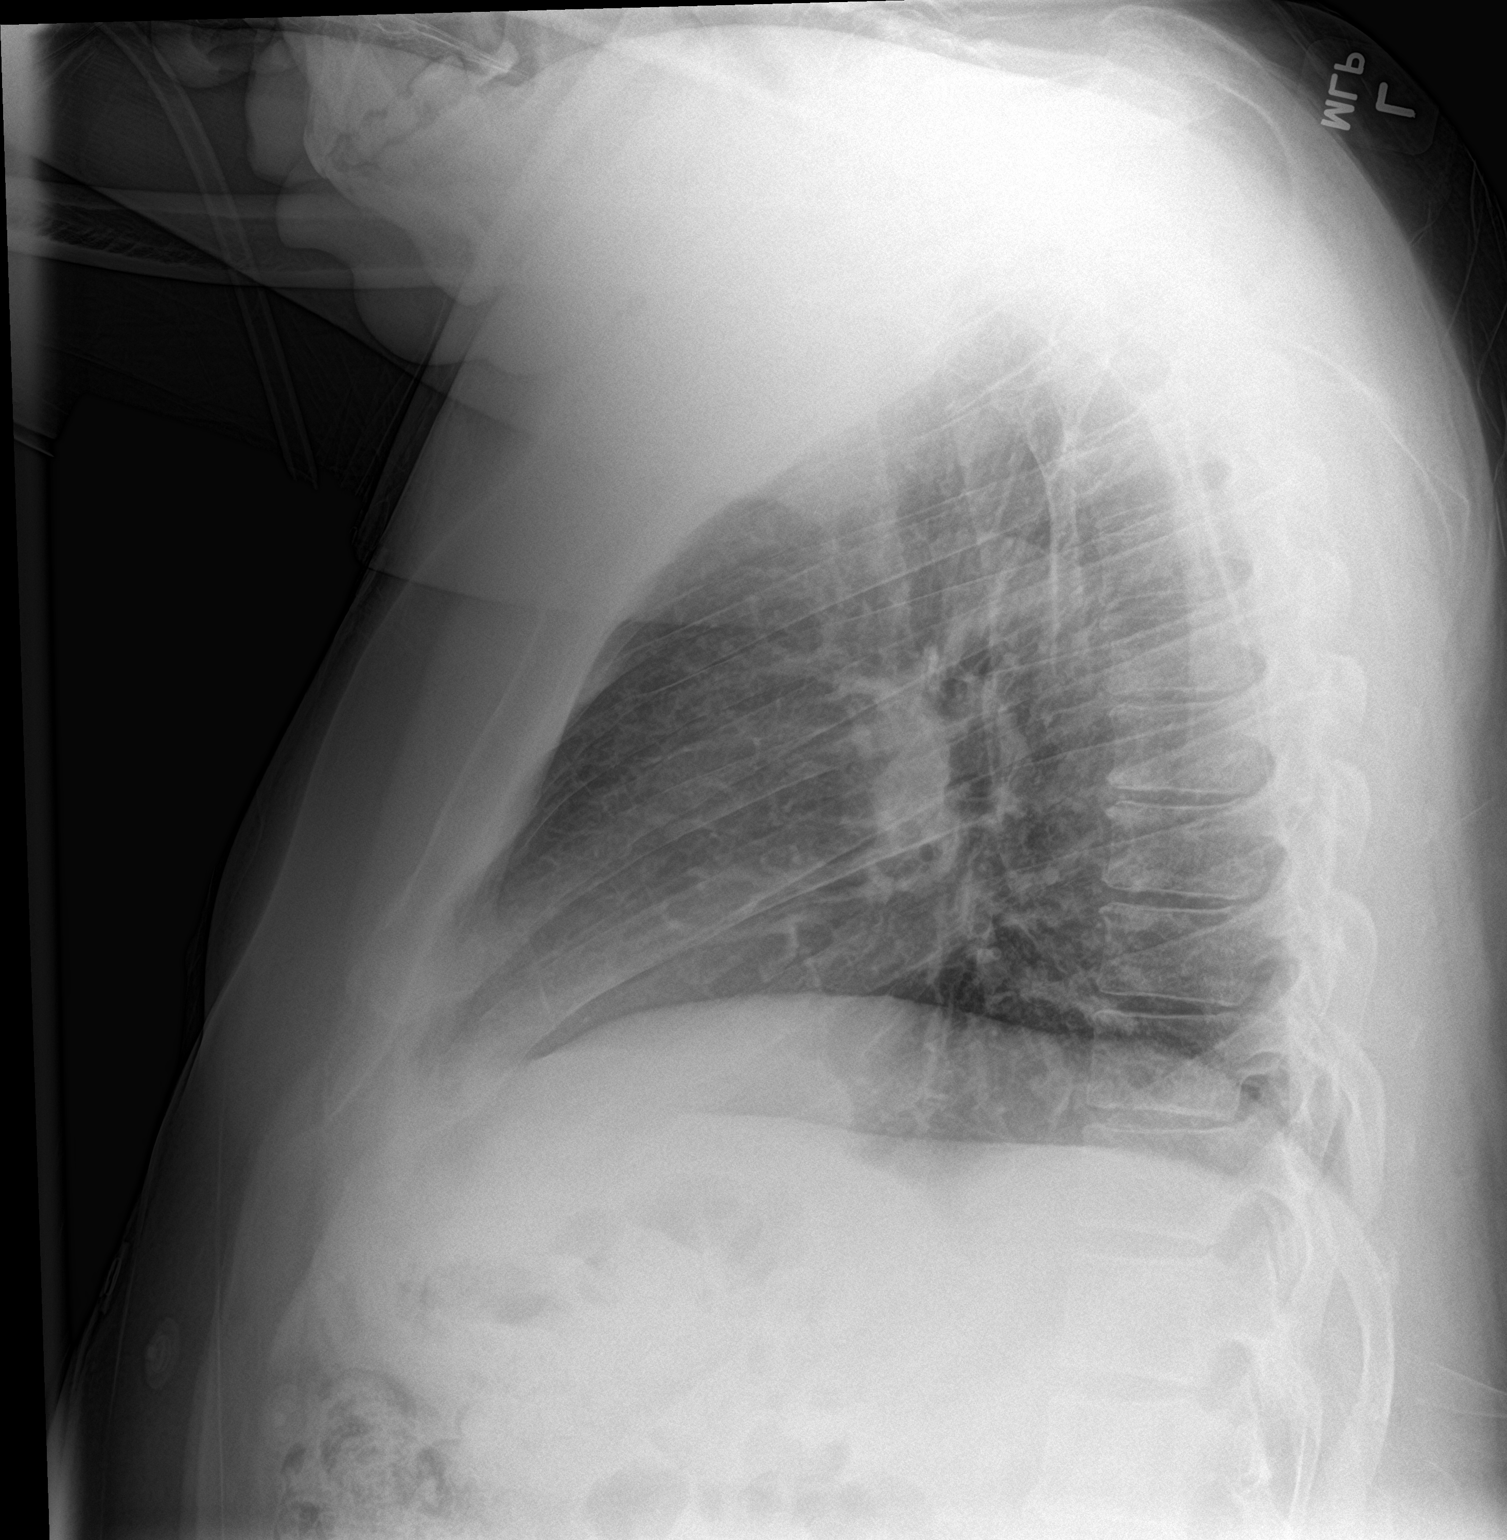

[2 of 2 positions shown; findings below may reference images not displayed]

FINDINGS: The heart size and mediastinal contours are within normal limits.
Both lungs are clear. The visualized skeletal structures are
unremarkable.
IMPRESSION: No active cardiopulmonary disease.
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
## 2023-09-01 ENCOUNTER — Emergency Department (HOSPITAL_COMMUNITY)
Admission: EM | Admit: 2023-09-01 | Discharge: 2023-09-02 | Disposition: A | Discharge status: Other Acute Inpatient Hospital | Attending: Emergency Medicine | Admitting: Emergency Medicine

## 2023-09-01 ENCOUNTER — Other Ambulatory Visit: Payer: Self-pay

## 2023-09-01 ENCOUNTER — Encounter (HOSPITAL_COMMUNITY): Payer: Self-pay | Admitting: Psychiatry

## 2023-09-01 DIAGNOSIS — Z79899 Other long term (current) drug therapy: Secondary | ICD-10-CM | POA: Insufficient documentation

## 2023-09-01 DIAGNOSIS — Z91148 Patient's other noncompliance with medication regimen for other reason: Secondary | ICD-10-CM | POA: Insufficient documentation

## 2023-09-01 DIAGNOSIS — F319 Bipolar disorder, unspecified: Secondary | ICD-10-CM | POA: Diagnosis not present

## 2023-09-01 DIAGNOSIS — R45851 Suicidal ideations: Secondary | ICD-10-CM | POA: Diagnosis not present

## 2023-09-01 DIAGNOSIS — F1410 Cocaine abuse, uncomplicated: Secondary | ICD-10-CM | POA: Diagnosis not present

## 2023-09-01 DIAGNOSIS — F3130 Bipolar disorder, current episode depressed, mild or moderate severity, unspecified: Secondary | ICD-10-CM | POA: Diagnosis present

## 2023-09-01 DIAGNOSIS — F1910 Other psychoactive substance abuse, uncomplicated: Secondary | ICD-10-CM | POA: Diagnosis not present

## 2023-09-01 DIAGNOSIS — R440 Auditory hallucinations: Secondary | ICD-10-CM | POA: Insufficient documentation

## 2023-09-01 DIAGNOSIS — Z91010 Allergy to peanuts: Secondary | ICD-10-CM | POA: Diagnosis not present

## 2023-09-01 DIAGNOSIS — Z7982 Long term (current) use of aspirin: Secondary | ICD-10-CM | POA: Diagnosis not present

## 2023-09-01 DIAGNOSIS — Y900 Blood alcohol level of less than 20 mg/100 ml: Secondary | ICD-10-CM | POA: Diagnosis not present

## 2023-09-01 DIAGNOSIS — I10 Essential (primary) hypertension: Secondary | ICD-10-CM | POA: Diagnosis not present

## 2023-09-01 LAB — RAPID URINE DRUG SCREEN, HOSP PERFORMED
Amphetamines: NOT DETECTED
Barbiturates: NOT DETECTED
Benzodiazepines: NOT DETECTED
Cocaine: POSITIVE — AB
Opiates: NOT DETECTED
Tetrahydrocannabinol: NOT DETECTED

## 2023-09-01 LAB — COMPREHENSIVE METABOLIC PANEL WITH GFR
ALT: 25 U/L (ref 0–44)
AST: 27 U/L (ref 15–41)
Albumin: 3.9 g/dL (ref 3.5–5.0)
Alkaline Phosphatase: 106 U/L (ref 38–126)
Anion gap: 9 (ref 5–15)
BUN: 11 mg/dL (ref 6–20)
CO2: 23 mmol/L (ref 22–32)
Calcium: 9.5 mg/dL (ref 8.9–10.3)
Chloride: 108 mmol/L (ref 98–111)
Creatinine, Ser: 1.05 mg/dL (ref 0.61–1.24)
GFR, Estimated: >60 mL/min (ref >60)
Glucose, Bld: 101 mg/dL — ABNORMAL HIGH (ref 70–99)
Potassium: 4.2 mmol/L (ref 3.5–5.1)
Sodium: 140 mmol/L (ref 135–145)
Total Bilirubin: 1 mg/dL (ref 0.0–1.2)
Total Protein: 7.8 g/dL (ref 6.5–8.1)

## 2023-09-01 LAB — CBC
HCT: 45.2 % (ref 39.0–52.0)
Hemoglobin: 15.3 g/dL (ref 13.0–17.0)
MCH: 31.9 pg (ref 26.0–34.0)
MCHC: 33.8 g/dL (ref 30.0–36.0)
MCV: 94.4 fL (ref 80.0–100.0)
Platelets: 324 K/uL (ref 150–400)
RBC: 4.79 MIL/uL (ref 4.22–5.81)
RDW: 13.1 % (ref 11.5–15.5)
WBC: 9.8 K/uL (ref 4.0–10.5)
nRBC: 0 % (ref 0.0–0.2)

## 2023-09-01 LAB — ETHANOL: Alcohol, Ethyl (B): <15 mg/dL (ref <15)

## 2023-09-01 NOTE — ED Notes (Signed)
 Pts clothing, shoes, and telephone placed in labeled belongings bag, pt wanded by security.

## 2023-09-01 NOTE — Progress Notes (Signed)
 Pt has been accepted to [facility] 09/01/2023 . Bed assignment:161   Pt meets inpatient criteria per Starlyn Patron, NP  Attending Physician will be Dr. Cole   Report can be called to: [phone]   Care Team Notified: [facility] Luke Sprang, RN, Newell Ka, EMT

## 2023-09-01 NOTE — ED Provider Notes (Signed)
 [place] EMERGENCY DEPARTMENT AT [facility] Provider Note   CSN: [id number] Arrival date & time: 09/01/23  1319     Patient presents with: Suicidal   Raymond Mcclure is a 58 y.o. male who presents to the emergency department with a chief complaint of suicidal ideation.  Patient states that he recently lost his mother and saw a picture of her earlier today at home and he began to have thoughts of hurting himself.  Patient states that he does have a psychiatric history but has stopped all medicines at home, stating that he could get them if he wanted but he just does not want to take them anymore.  He does appreciate auditory as well as visual hallucinations.  Stating that he has voices that just talking to his head, but cannot tell me exactly what they are saying.  He also states that the visual hallucinations he sees are people walking around him and sometimes getting close to him.  He denies these hallucinations telling him to kill himself or harm himself.  Denies a current plan. Does appreciate 4 previous suicide attempts including trying to overdose or take bad drugs, jump off a bridge, and jump off of a parking deck. Patient denies current medical complaint otherwise.  He also does appreciate cocaine use, with his most recent episode being this morning.  Past medical history significant for bipolar affective disorder, dyslipidemia, anxiety state, polysubstance abuse, suicidal ideation, cocaine induced mood disorder, schizoaffective disorder, alcohol use disorder, stimulant use disorder, etc. Denies homicidal ideations.   HPI     Prior to Admission medications   Medication Sig Start Date End Date Taking? Authorizing Provider  [facility] ASPIRIN  ADULT LOW DOSE 81 MG chewable tablet Chew 1 tablet (81 mg total) by mouth daily. 03/14/23  Yes Izella Ismael NOVAK, MD  atorvastatin  (LIPITOR) 10 MG tablet Take 1 tablet (10 mg total) by mouth daily. Patient not taking: Reported on 09/01/2023 03/14/23    Izella Ismael NOVAK, MD  diltiazem  (CARDIZEM  CD) 240 MG 24 hr capsule Take 1 capsule (240 mg total) by mouth daily. Patient not taking: Reported on 09/01/2023 03/14/23   Izella Ismael NOVAK, MD  escitalopram  (LEXAPRO ) 10 MG tablet Take 1 tablet (10 mg total) by mouth daily. Patient not taking: Reported on 09/01/2023 03/15/23   Izella Ismael NOVAK, MD  hydrOXYzine  (ATARAX ) 25 MG tablet Take 1 tablet (25 mg total) by mouth 3 (three) times daily as needed for anxiety. Patient not taking: Reported on 09/01/2023 03/14/23   Izella Ismael NOVAK, MD  isosorbide  mononitrate (IMDUR ) 30 MG 24 hr tablet Take 1 tablet (30 mg total) by mouth daily. Patient not taking: Reported on 09/01/2023 03/14/23   Izella Ismael NOVAK, MD  losartan  (COZAAR ) 50 MG tablet Take 1 tablet (50 mg total) by mouth daily. Patient not taking: Reported on 09/01/2023 03/15/23   Izella Ismael NOVAK, MD  naltrexone  (DEPADE) 50 MG tablet Take 1 tablet (50 mg total) by mouth at bedtime. Patient not taking: Reported on 09/01/2023 03/14/23   Izella Ismael NOVAK, MD  nicotine  (NICODERM CQ  - DOSED IN MG/24 HOURS) 14 mg/24hr patch Place 1 patch (14 mg total) onto the skin daily. Patient not taking: Reported on 09/01/2023 03/15/23   Hoang, Daniela B, MD  polyethylene glycol (MIRALAX  / GLYCOLAX ) 17 g packet Take 17 g by mouth daily. Patient not taking: Reported on 09/01/2023 03/15/23   Izella Ismael NOVAK, MD  risperiDONE  (RISPERDAL ) 3 MG tablet Take 1 tablet (3 mg total) by mouth  at bedtime. Patient not taking: Reported on 09/01/2023 03/14/23   Izella Ismael NOVAK, MD  traZODone  (DESYREL ) 50 MG tablet Take 1 tablet (50 mg total) by mouth at bedtime as needed for sleep. Patient not taking: Reported on 09/01/2023 03/14/23   Hoang, Daniela B, MD  umeclidinium-vilanterol (ANORO ELLIPTA ) 62.5-25 MCG/ACT AEPB Inhale 1 puff into the lungs daily. Patient not taking: Reported on 09/01/2023 03/15/23   Hoang, Daniela B, MD    Allergies: Bee venom, Peanut-containing drug products, and Covid-19  (mrna) vaccine    Review of Systems  Psychiatric/Behavioral:  Positive for suicidal ideas.     Updated Vital Signs BP (!) 148/87   Pulse 100   Temp 98.1 F (36.7 C)   Resp 15   Ht 5' 10 (1.778 m)   Wt 113.9 kg   SpO2 97%   BMI 36.03 kg/m   Physical Exam Vitals and nursing note reviewed.  Constitutional:      General: He is not in acute distress.    Appearance: Normal appearance. He is not ill-appearing, toxic-appearing or diaphoretic.  HENT:     Head: Normocephalic and atraumatic.  Eyes:     General: No scleral icterus. Pulmonary:     Effort: Pulmonary effort is normal. No respiratory distress.  Musculoskeletal:        General: Normal range of motion.  Skin:    General: Skin is warm and dry.     Capillary Refill: Capillary refill takes less than 2 seconds.  Neurological:     General: No focal deficit present.     Mental Status: He is alert and oriented to person, place, and time.  Psychiatric:        Attention and Perception: Attention normal.        Mood and Affect: Mood is depressed.        Speech: Speech normal.        Behavior: Behavior normal.        Thought Content: Thought content includes suicidal ideation. Thought content does not include homicidal ideation. Thought content does not include homicidal or suicidal plan.     (all labs ordered are listed, but only abnormal results are displayed) Labs Reviewed  COMPREHENSIVE METABOLIC PANEL WITH GFR - Abnormal; Notable for the following components:      Result Value   Glucose, Bld 101 (*)    All other components within normal limits  RAPID URINE DRUG SCREEN, HOSP PERFORMED - Abnormal; Notable for the following components:   Cocaine POSITIVE (*)    All other components within normal limits  ETHANOL  CBC    EKG: None  Radiology: No results found.   Procedures   Medications Ordered in the ED - No data to display                                  Medical Decision Making Amount and/or  Complexity of Data Reviewed Labs: ordered.  Risk Decision regarding hospitalization.   Patient presents to the ED for concern of suicidal ideation, this involves an extensive number of treatment options, and is a complaint that carries with it a high risk of complications and morbidity.  The differential diagnosis includes suicidal ideation, homicidal ideation, auditory visual hallucinations, noncompliance with medications, psychiatric diagnosis, emotional response, etc.   Co morbidities that complicate the patient evaluation  bipolar affective disorder, dyslipidemia, anxiety state, polysubstance abuse, suicidal ideation, cocaine induced mood disorder, schizoaffective disorder, alcohol  use disorder, stimulant use disorder   Lab Tests:  I Ordered, and personally interpreted labs.  The pertinent results include: CBC and CMP unremarkable, rapid UDS positive for cocaine, ethanol less than 15   Medicines ordered and prescription drug management:  I have reviewed the patients home medicines and have made adjustments as needed   Test Considered:  none   Critical Interventions:  none   Consultations Obtained:  I requested consultation with the TTS team,  and discussed lab and imaging findings as well as pertinent plan - they recommend: Inpatient admission   Problem List / ED Course:  58 year old male, extensive psychiatric history, here for suicidal ideations Denies medical complaint, vital signs stable Patient admits to suicidal ideations with no active plan, admits to auditory and visual hallucinations, no homicidal ideations Patient states that if he leaves the hospital and goes home he is afraid of what he may do to himself including hurting himself Patient states that he has stopped all psychiatric medications outpatient, high clinical suspicion that this may be the cause for acute breakthrough symptoms as well as possible emotional response related to passing of  mother Due to patient's diagnosed psychiatric illnesses and noncompliance of medications as well as acute suicidal ideations, past suicide attempts, IVC placed in the emergency department, medical clearance labs obtained, TTS consulted who recommend inpatient admission Psych hold orders placed including diet for patient, I did not reorder any of the patient's home medications as he states he has been noncompliant for some time, we will leave this up to the psychiatric team Patient admitted to behavioral health unit for ongoing diagnosis and treatment, patient medically cleared Most likely diagnosis at this time is worsening psychiatric symptoms due to medication noncompliance as well as acute emotional response related to passing of mother, I do think that inpatient stay is necessary to stabilize this patient to prevent self-harm and worsening symptoms   Reevaluation:  After the interventions noted above, I reevaluated the patient and found that they have :stayed the same   Social Determinants of Health:  Multiple psychiatric diagnosis, substance abuse   Dispostion:  After consideration of the diagnostic results and the patients response to treatment, I feel that the patent would benefit from admission to the behavioral health unit for ongoing diagnosis and treatment.     Final diagnoses:  Suicidal ideation  Noncompliance with medications    ED Discharge Orders     None          Janetta Terrall JULIANNA DEVONNA 09/01/23 2325    Garrick Charleston, MD 09/03/23 1426
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
## 2023-09-01 NOTE — ED Notes (Signed)
 Pt has one bag of belongings at nurses station [phone]

## 2023-09-01 NOTE — ED Triage Notes (Signed)
 Pt states that he has been feeling depressed for 3 days; pt recently seen a picture of his mom hanging up in the house and started being depressed. Pt reports wanting to commit suicide several times. Pt reports weed and coke use along with liquor.

## 2023-09-01 NOTE — ED Notes (Signed)
 igned      Pt has been accepted to [facility] 09/01/2023 . Bed assignment:161    Pt meets inpatient criteria per Starlyn Patron, NP   Attending Physician will be Dr. Cole    Report can be called to: [phone]    Care Team Notified: [facility] Luke Sprang, RN, Newell Ka, EMT         Electronically signed by Fate Mix, LCSW at 09/01/2023 11:13 PM

## 2023-09-02 ENCOUNTER — Ambulatory Visit (HOSPITAL_COMMUNITY)
Admission: EM | Admit: 2023-09-02 | Discharge: 2023-09-02 | Disposition: A | Discharge status: Other Acute Inpatient Hospital | Source: Intra-hospital

## 2023-09-02 ENCOUNTER — Other Ambulatory Visit (INDEPENDENT_AMBULATORY_CARE_PROVIDER_SITE_OTHER)
Admission: EM | Admit: 2023-09-02 | Discharge: 2023-09-10 | Disposition: A | Discharge status: Other Acute Inpatient Hospital | Source: Intra-hospital | Attending: Psychiatry | Admitting: Psychiatry

## 2023-09-02 DIAGNOSIS — R079 Chest pain, unspecified: Secondary | ICD-10-CM | POA: Diagnosis present

## 2023-09-02 DIAGNOSIS — F250 Schizoaffective disorder, bipolar type: Secondary | ICD-10-CM | POA: Insufficient documentation

## 2023-09-02 DIAGNOSIS — F1910 Other psychoactive substance abuse, uncomplicated: Secondary | ICD-10-CM | POA: Insufficient documentation

## 2023-09-02 DIAGNOSIS — I10 Essential (primary) hypertension: Secondary | ICD-10-CM | POA: Diagnosis not present

## 2023-09-02 DIAGNOSIS — Z79899 Other long term (current) drug therapy: Secondary | ICD-10-CM | POA: Diagnosis not present

## 2023-09-02 DIAGNOSIS — F251 Schizoaffective disorder, depressive type: Secondary | ICD-10-CM

## 2023-09-02 DIAGNOSIS — Z91010 Allergy to peanuts: Secondary | ICD-10-CM | POA: Diagnosis not present

## 2023-09-02 DIAGNOSIS — I2510 Atherosclerotic heart disease of native coronary artery without angina pectoris: Secondary | ICD-10-CM | POA: Diagnosis not present

## 2023-09-02 DIAGNOSIS — R45851 Suicidal ideations: Secondary | ICD-10-CM

## 2023-09-02 DIAGNOSIS — Z7982 Long term (current) use of aspirin: Secondary | ICD-10-CM | POA: Diagnosis not present

## 2023-09-02 DIAGNOSIS — R0602 Shortness of breath: Secondary | ICD-10-CM | POA: Diagnosis not present

## 2023-09-02 MED ORDER — LOPERAMIDE HCL 2 MG PO CAPS: 2.0000 mg | ORAL_CAPSULE | ORAL | Status: AC | PRN

## 2023-09-02 MED ORDER — NICOTINE 14 MG/24HR TD PT24
14.0000 mg | MEDICATED_PATCH | Freq: Every day | TRANSDERMAL | Status: DC
  Administered 2023-09-03 – 2023-09-09 (×7): 14 mg via TRANSDERMAL
  Filled 2023-09-02 (×7): qty 1

## 2023-09-02 MED ORDER — METHOCARBAMOL 500 MG PO TABS: 500.0000 mg | ORAL_TABLET | Freq: Three times a day (TID) | ORAL | Status: AC | PRN

## 2023-09-02 MED ORDER — NAPROXEN 500 MG PO TABS: 500.0000 mg | ORAL_TABLET | Freq: Two times a day (BID) | ORAL | Status: AC | PRN

## 2023-09-02 MED ORDER — ALUM & MAG HYDROXIDE-SIMETH 200-200-20 MG/5ML PO SUSP
30.0000 mL | ORAL | Status: DC | PRN
  Administered 2023-09-10: 30 mL via ORAL
  Filled 2023-09-02: qty 30

## 2023-09-02 MED ORDER — PANTOPRAZOLE SODIUM 40 MG PO TBEC
40.0000 mg | DELAYED_RELEASE_TABLET | Freq: Every day | ORAL | Status: DC
  Administered 2023-09-02 – 2023-09-09 (×8): 40 mg via ORAL
  Filled 2023-09-02 (×8): qty 1

## 2023-09-02 MED ORDER — TRAZODONE HCL 50 MG PO TABS: 50.0000 mg | ORAL_TABLET | Freq: Every evening | ORAL | Status: DC | PRN

## 2023-09-02 MED ORDER — ESCITALOPRAM OXALATE 10 MG PO TABS
10.0000 mg | ORAL_TABLET | Freq: Every day | ORAL | Status: DC
  Administered 2023-09-02 – 2023-09-09 (×8): 10 mg via ORAL
  Filled 2023-09-02 (×8): qty 1

## 2023-09-02 MED ORDER — HALOPERIDOL 5 MG PO TABS: 5.0000 mg | ORAL_TABLET | Freq: Three times a day (TID) | ORAL | Status: DC | PRN

## 2023-09-02 MED ORDER — ATORVASTATIN CALCIUM 10 MG PO TABS
10.0000 mg | ORAL_TABLET | Freq: Every day | ORAL | Status: DC
  Administered 2023-09-02 – 2023-09-09 (×8): 10 mg via ORAL
  Filled 2023-09-02 (×8): qty 1

## 2023-09-02 MED ORDER — LOSARTAN POTASSIUM 50 MG PO TABS
50.0000 mg | ORAL_TABLET | Freq: Every day | ORAL | Status: DC
  Administered 2023-09-02 – 2023-09-04 (×3): 50 mg via ORAL
  Filled 2023-09-02 (×3): qty 1

## 2023-09-02 MED ORDER — MAGNESIUM HYDROXIDE 400 MG/5ML PO SUSP: 30.0000 mL | Freq: Every day | ORAL | Status: DC | PRN

## 2023-09-02 MED ORDER — DIPHENHYDRAMINE HCL 50 MG PO CAPS: 50.0000 mg | ORAL_CAPSULE | Freq: Three times a day (TID) | ORAL | Status: DC | PRN

## 2023-09-02 MED ORDER — OLANZAPINE 10 MG IM SOLR: 5.0000 mg | Freq: Three times a day (TID) | INTRAMUSCULAR | Status: DC | PRN

## 2023-09-02 MED ORDER — ACETAMINOPHEN 325 MG PO TABS
650.0000 mg | ORAL_TABLET | Freq: Four times a day (QID) | ORAL | Status: DC | PRN
  Administered 2023-09-06: 650 mg via ORAL
  Filled 2023-09-02: qty 2

## 2023-09-02 MED ORDER — RISPERIDONE 1 MG PO TABS
1.0000 mg | ORAL_TABLET | Freq: Two times a day (BID) | ORAL | Status: DC
  Administered 2023-09-02 – 2023-09-09 (×16): 1 mg via ORAL
  Filled 2023-09-02 (×16): qty 1

## 2023-09-02 MED ORDER — ONDANSETRON 4 MG PO TBDP: 4.0000 mg | ORAL_TABLET | Freq: Four times a day (QID) | ORAL | Status: AC | PRN

## 2023-09-02 MED ORDER — OLANZAPINE 10 MG IM SOLR: 10.0000 mg | Freq: Three times a day (TID) | INTRAMUSCULAR | Status: DC | PRN

## 2023-09-02 MED ORDER — HYDROXYZINE HCL 25 MG PO TABS
25.0000 mg | ORAL_TABLET | Freq: Four times a day (QID) | ORAL | Status: AC | PRN
  Administered 2023-09-02: 25 mg via ORAL
  Filled 2023-09-02: qty 1

## 2023-09-02 MED ORDER — ASPIRIN 81 MG PO CHEW
81.0000 mg | CHEWABLE_TABLET | Freq: Every day | ORAL | Status: DC
  Administered 2023-09-02 – 2023-09-09 (×8): 81 mg via ORAL
  Filled 2023-09-02 (×8): qty 1

## 2023-09-02 MED ORDER — UMECLIDINIUM-VILANTEROL 62.5-25 MCG/ACT IN AEPB
1.0000 | INHALATION_SPRAY | Freq: Every day | RESPIRATORY_TRACT | Status: DC
  Administered 2023-09-02 – 2023-09-09 (×8): 1 via RESPIRATORY_TRACT
  Filled 2023-09-02: qty 14

## 2023-09-02 MED ORDER — DICYCLOMINE HCL 20 MG PO TABS: 20.0000 mg | ORAL_TABLET | Freq: Four times a day (QID) | ORAL | Status: AC | PRN
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
## 2023-09-02 NOTE — Group Note (Signed)
 Group Topic: Understanding Self  Group Date: 09/02/2023 Start Time: 1930 End Time: 2000 Facilitators: Anice Benton LABOR, NT  Department: [facility]  Number of Participants: 10  Group Focus: clarity of thought Treatment Modality:  Cognitive Behavioral Therapy Interventions utilized were assignment (Thankful Worksheet) Purpose: explore maladaptive thinking  Name: Raymond Mcclure Date of Birth: 16-Sep-1965  MR: [id number]    Level of Participation: active Quality of Participation: cooperative Interactions with others: gave feedback Mood/Affect: appropriate Triggers (if applicable): None Cognition: coherent/clear Progress: Moderate Response: Good Plan: follow-up needed  Patients Problems:  Patient Active Problem List   Diagnosis Date Noted   Schizoaffective disorder, depressive type (HCC) 03/04/2023   Stimulant use disorder 03/04/2023   Alcohol use disorder, severe, dependence (HCC) 03/04/2023   Pulmonary nodules 02/12/2022   History of radiofrequency ablation procedure for cardiac arrhythmia 08/27/2021   Primary hypertension 08/27/2021   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

## 2023-09-02 NOTE — ED Notes (Signed)
 Assumed care of patient this am, patient resting quietly in bed,  pleasant and cooperative upon approach denied and denied self harm thoughts, plan or intentions. There is no overt discomfort noted

## 2023-09-02 NOTE — ED Notes (Signed)
 Patient admitted to [facility] this am for  ETOH detox. Patient denies SI/HI/AVH at the moment. On arrival, pt AOX4 and tiring.Oriented to the unit and unit rules.Skin assessment WNL. Environment secured per policy. Peanut butter sandwich was given to pt per request. Patient made comfortable in bed. Will monitor for safety.

## 2023-09-02 NOTE — ED Provider Notes (Signed)
 Facility Based Crisis Admission H&P  Date: 09/02/23 Patient Name: Raymond Mcclure MRN: [id number] Chief Complaint: substance abuse  Diagnoses:  Final diagnoses:  Polysubstance abuse (HCC)  Recurrent major depressive disorder, remission status unspecified (HCC)  Suicidal ideation    HPI: Raymond Mcclure, 58 y/o male with a history of polysubstance abuse, suicidal ideation, depression, anxiety, presented to [facility] as a transfer from [facility]. patient is here for substance abuse detox.  According to the patient he has been using crack cocaine on a daily basis $100-$200 each time.  According to him he also drinks but every now and then.  Patient is currently living by himself and reported that he has a disability.  Copied notes from psych consult: Raymond Mcclure is a 58 y.o. male admitted: Presented to the EDfor 09/01/2023  1:21 PM for increased depression, anxiety, substance abuse and suicidal ideations. He carries the psychiatric diagnoses of Schizoaffective disorder,  Bipolar with Depression, Polysubstance abuse  and has a past medical history of  Dyslipidemia, mild CAD, HTN.    His current presentation of increased depression, anxiety and suicidal ideations  is most consistent with his past psychiatric diagnosis of depression and past inpatient treatments. He meets criteria for admission to inpatient,  based on presenting symptoms, his current precipitating factors,  and past psychiatric history. No  current outpatient psychotropic medications reported but was prescribed medications in the past  and historically he has had a therapeutic  response to these medications. He was not  compliant with medications prior to admission as evidenced by his increasing symptoms, leading to this admission. On initial examination, patient  is restless, sad, depressed, tearful, and endorsing thoughts of suicide as well as hallucinations. Please see plan below for detailed recommendations.   Face-to-face  evaluation of patient, patient is alert and oriented x 4, speech is clear, maintaining minimal eye contact.  Patient denies SI, HI, AVH or paranoia at this present moment.  Patient reports using crack cocaine on a daily basis. Patient is also IVC from [facility].  Per the IVC patient is in the ED due to suicidal ideation patient recently lost his mother and saw a picture of her and this upset him.  Patient states stated that he begun to have thoughts of hurting himself.  He admitted to 4 previous suicide attempts.  Drinking and trying to overdose jumping off a bridge.  According to patient he is afraid he will go through with hurting himself if he stays home.  Patient stated he has stopped all of his home medication including his psych medicines.  Admitted to auditory and visual hallucination.  States he hear voices in the mornings.  History of bipolar affective disorder polysubstance abuse, suicidal ideation schizoaffective disorder alcohol use disorder.  Patient was recommended for [facility] admission   PHQ 2-9:  Flowsheet Row ED from 09/02/2023 in [facility] Office Visit from 09/09/2021 in [facility] [place]. [facility] Office Visit from 08/27/2021 in [facility] Of [place]  Thoughts that you would be better off dead, or of hurting yourself in some way Several days Not at all Not at all  PHQ-9 Total Score 14 15 13     Flowsheet Row ED from 09/01/2023 in [facility] Emergency Department at [facility] Most recent reading at 09/01/2023  4:09 PM Admission (Discharged) [facility] Most recent reading at 03/03/2023 11:45 PM ED from 03/03/2023 in [facility] Most recent reading at 03/03/2023  5:44 PM  C-SSRS RISK CATEGORY High Risk High Risk High Risk      Total Time spent with patient: 20  minutes  Musculoskeletal  Strength & Muscle Tone: within normal limits Gait & Station: normal Patient leans: N/A  Psychiatric Specialty Exam  Presentation General Appearance:  Casual; Appropriate for Environment  Eye Contact: Fair  Speech: Clear and Coherent  Speech Volume: Normal  Handedness: Right   Mood and Affect  Mood: Euphoric  Affect: Congruent   Thought Process  Thought Processes: Coherent  Descriptions of Associations:Intact  Orientation:Full (Time, Place and Person)  Thought Content:WDL  Diagnosis of Schizophrenia or Schizoaffective disorder in past: No data recorded  Hallucinations:Hallucinations: None  Ideas of Reference:None  Suicidal Thoughts:Suicidal Thoughts: No  Homicidal Thoughts:Homicidal Thoughts: No   Sensorium  Memory: Immediate Fair  Judgment: Fair  Insight: Fair   Art therapist  Concentration: Fair  Attention Span: Fair  Recall: Fair  Fund of Knowledge: Fair  Language: Fair   Psychomotor Activity  Psychomotor Activity: Psychomotor Activity: Normal   Assets  Assets: Desire for Improvement; Resilience; Vocational/Educational   Sleep  Sleep: Sleep: Fair Number of Hours of Sleep: 8   Nutritional Assessment (For OBS and FBC admissions only) Has the patient had a weight loss or gain of 10 pounds or more in the last 3 months?: No Has the patient had a decrease in food intake/or appetite?: No Does the patient have dental problems?: No Does the patient have eating habits or behaviors that may be indicators of an eating disorder including binging or inducing vomiting?: No Has the patient recently lost weight without trying?: 0 Has the patient been eating poorly because of a decreased appetite?: 0 Malnutrition Screening Tool Score: 0    Physical Exam HENT:     Head: Normocephalic.     Nose: Nose normal.  Eyes:     Pupils: Pupils are equal, round, and reactive to light.  Cardiovascular:      Rate and Rhythm: Normal rate.  Pulmonary:     Effort: Pulmonary effort is normal.  Musculoskeletal:        General: Normal range of motion.     Cervical back: Normal range of motion.  Neurological:     General: No focal deficit present.     Mental Status: He is alert.  Psychiatric:        Mood and Affect: Mood normal.        Behavior: Behavior normal.        Thought Content: Thought content normal.        Judgment: Judgment normal.    Review of Systems  Constitutional: Negative.   HENT: Negative.    Eyes: Negative.   Respiratory: Negative.    Cardiovascular: Negative.   Gastrointestinal: Negative.   Genitourinary: Negative.   Musculoskeletal: Negative.   Skin: Negative.   Neurological: Negative.   Psychiatric/Behavioral:  Positive for depression and substance abuse. The patient is nervous/anxious.     There were no vitals taken for this visit. There is no height or weight on file to calculate BMI.  Past Psychiatric History: Schizoaffective disorder, polysubstance abuse, alcohol abuse  Is the patient at risk to self? No  Has the patient been a risk to self in the past 6 months? Yes .    Has the patient been a risk to self within the distant  past? Yes   Is the patient a risk to others? No   Has the patient been a risk to others in the past 6 months? No   Has the patient been a risk to others within the distant past? No   Past Medical History: see chart Family History: Unknown Social History: Cocaine, polysubstance  Last Labs:  Admission on 09/01/2023, Discharged on 09/02/2023  Component Date Value Ref Range Status   Sodium 09/01/2023 140  135 - 145 mmol/L Final   Potassium 09/01/2023 4.2  3.5 - 5.1 mmol/L Final   Chloride 09/01/2023 108  98 - 111 mmol/L Final   CO2 09/01/2023 23  22 - 32 mmol/L Final   Glucose, Bld 09/01/2023 101 (H)  70 - 99 mg/dL Final   Glucose reference range applies only to samples taken after fasting for at least 8 hours.   BUN  09/01/2023 11  6 - 20 mg/dL Final   Creatinine, Ser 09/01/2023 1.05  0.61 - 1.24 mg/dL Final   Calcium  09/01/2023 9.5  8.9 - 10.3 mg/dL Final   Total Protein 92/68/7974 7.8  6.5 - 8.1 g/dL Final   Albumin 92/68/7974 3.9  3.5 - 5.0 g/dL Final   AST 92/68/7974 27  15 - 41 U/L Final   ALT 09/01/2023 25  0 - 44 U/L Final   Alkaline Phosphatase 09/01/2023 106  38 - 126 U/L Final   Total Bilirubin 09/01/2023 1.0  0.0 - 1.2 mg/dL Final   GFR, Estimated 09/01/2023 >60  >60 mL/min Final   Comment: (NOTE) Calculated using the CKD-EPI Creatinine Equation (2021)    Anion gap 09/01/2023 9  5 - 15 Final   Performed at [facility], [address], [place], [place] [address]   Alcohol, Ethyl (B) 09/01/2023 <15  <15 mg/dL Final   Comment: (NOTE) For medical purposes only. Performed at [facility] [address], [place], [place] [address]    WBC 09/01/2023 9.8  4.0 - 10.5 K/uL Final   RBC 09/01/2023 4.79  4.22 - 5.81 MIL/uL Final   Hemoglobin 09/01/2023 15.3  13.0 - 17.0 g/dL Final   HCT 92/68/7974 45.2  39.0 - 52.0 % Final   MCV 09/01/2023 94.4  80.0 - 100.0 fL Final   MCH 09/01/2023 31.9  26.0 - 34.0 pg Final   MCHC 09/01/2023 33.8  30.0 - 36.0 g/dL Final   RDW 92/68/7974 13.1  11.5 - 15.5 % Final   Platelets 09/01/2023 324  150 - 400 K/uL Final   nRBC 09/01/2023 0.0  0.0 - 0.2 % Final   Performed at [facility], [address], [place], [place] [address]   Opiates 09/01/2023 NONE DETECTED  NONE DETECTED Final   Cocaine 09/01/2023 POSITIVE (A)  NONE DETECTED Final   Benzodiazepines 09/01/2023 NONE DETECTED  NONE DETECTED Final   Amphetamines 09/01/2023 NONE DETECTED  NONE DETECTED Final   Tetrahydrocannabinol 09/01/2023 NONE DETECTED  NONE DETECTED Final   Barbiturates 09/01/2023 NONE DETECTED  NONE DETECTED Final   Comment: (NOTE) DRUG SCREEN FOR MEDICAL PURPOSES ONLY.  IF CONFIRMATION IS NEEDED FOR ANY PURPOSE, NOTIFY  LAB WITHIN 5 DAYS.  LOWEST DETECTABLE LIMITS FOR URINE DRUG SCREEN Drug Class                     Cutoff (ng/mL) Amphetamine and metabolites    1000 Barbiturate and metabolites    200 Benzodiazepine  200 Opiates and metabolites        300 Cocaine and metabolites        300 THC                            50 Performed at [facility], [address], [place], [place] [address]   Admission on 03/03/2023, Discharged on 03/14/2023  Component Date Value Ref Range Status   Prolactin 03/08/2023 31.2 (H)  3.6 - 25.2 ng/mL Final   Comment: (NOTE) Performed At: [facility] [address] [place], [place] [id number] Jennette Shorter MD Ey:[id number]    SARS Coronavirus 2 by RT PCR 03/12/2023 POSITIVE (A)  NEGATIVE Final   Comment: (NOTE) SARS-CoV-2 target nucleic acids are DETECTED.  The SARS-CoV-2 RNA is generally detectable in upper respiratory specimens during the acute phase of infection. Positive results are indicative of the presence of the identified virus, but do not rule out bacterial infection or co-infection with other pathogens not detected by the test. Clinical correlation with patient history and other diagnostic information is necessary to determine patient infection status. The expected result is Negative.  Fact Sheet for Patients: BloggerCourse.com  Fact Sheet for Healthcare Providers: SeriousBroker.it  This test is not yet approved or cleared by the [place]  FDA and  has been authorized for detection and/or diagnosis of SARS-CoV-2 by FDA under an Emergency Use Authorization (EUA).  This EUA will remain in effect (meaning this test can be used) for the duration of  the COVID-19 declaration under Section 564(b)(1) of the A                          ct, 21 U.S.C. section 360bbb-3(b)(1), unless the authorization is terminated or revoked sooner.     Influenza A by PCR  03/12/2023 NEGATIVE  NEGATIVE Final   Influenza B by PCR 03/12/2023 NEGATIVE  NEGATIVE Final   Comment: (NOTE) The Xpert Xpress SARS-CoV-2/FLU/RSV plus assay is intended as an aid in the diagnosis of influenza from Nasopharyngeal swab specimens and should not be used as a sole basis for treatment. Nasal washings and aspirates are unacceptable for Xpert Xpress SARS-CoV-2/FLU/RSV testing.  Fact Sheet for Patients: BloggerCourse.com  Fact Sheet for Healthcare Providers: SeriousBroker.it  This test is not yet approved or cleared by the [place]  FDA and has been authorized for detection and/or diagnosis of SARS-CoV-2 by FDA under an Emergency Use Authorization (EUA). This EUA will remain in effect (meaning this test can be used) for the duration of the COVID-19 declaration under Section 564(b)(1) of the Act, 21 U.S.C. section 360bbb-3(b)(1), unless the authorization is terminated or revoked.     Resp Syncytial Virus by PCR 03/12/2023 NEGATIVE  NEGATIVE Final   Comment: (NOTE) Fact Sheet for Patients: BloggerCourse.com  Fact Sheet for Healthcare Providers: SeriousBroker.it  This test is not yet approved or cleared by the [place]  FDA and has been authorized for detection and/or diagnosis of SARS-CoV-2 by FDA under an Emergency Use Authorization (EUA). This EUA will remain in effect (meaning this test can be used) for the duration of the COVID-19 declaration under Section 564(b)(1) of the Act, 21 U.S.C. section 360bbb-3(b)(1), unless the authorization is terminated or revoked.  Performed at [facility], [address], [place], [place] [address]     Allergies: Bee venom, Peanut-containing drug products, and Covid-19 (mrna) vaccine  Medications:  Facility Ordered Medications  Medication  acetaminophen  (TYLENOL ) tablet 650 mg   alum & mag  hydroxide-simeth (MAALOX/MYLANTA) 200-200-20 MG/5ML suspension 30 mL   magnesium  hydroxide (MILK OF MAGNESIA) suspension 30 mL   haloperidol (HALDOL) tablet 5 mg   And   diphenhydrAMINE  (BENADRYL ) capsule 50 mg   OLANZapine  (ZYPREXA ) injection 5 mg   OLANZapine  (ZYPREXA ) injection 10 mg   dicyclomine  (BENTYL ) tablet 20 mg   hydrOXYzine  (ATARAX ) tablet 25 mg   loperamide  (IMODIUM ) capsule 2-4 mg   methocarbamol  (ROBAXIN ) tablet 500 mg   naproxen  (NAPROSYN ) tablet 500 mg   ondansetron  (ZOFRAN -ODT) disintegrating tablet 4 mg   PTA Medications  Medication Sig   [employer] ASPIRIN  ADULT LOW DOSE 81 MG chewable tablet Chew 1 tablet (81 mg total) by mouth daily.   atorvastatin  (LIPITOR) 10 MG tablet Take 1 tablet (10 mg total) by mouth daily. (Patient not taking: Reported on 09/01/2023)   diltiazem  (CARDIZEM  CD) 240 MG 24 hr capsule Take 1 capsule (240 mg total) by mouth daily. (Patient not taking: Reported on 09/01/2023)   isosorbide  mononitrate (IMDUR ) 30 MG 24 hr tablet Take 1 tablet (30 mg total) by mouth daily. (Patient not taking: Reported on 09/01/2023)   losartan  (COZAAR ) 50 MG tablet Take 1 tablet (50 mg total) by mouth daily. (Patient not taking: Reported on 09/01/2023)   escitalopram  (LEXAPRO ) 10 MG tablet Take 1 tablet (10 mg total) by mouth daily. (Patient not taking: Reported on 09/01/2023)   hydrOXYzine  (ATARAX ) 25 MG tablet Take 1 tablet (25 mg total) by mouth 3 (three) times daily as needed for anxiety. (Patient not taking: Reported on 09/01/2023)   nicotine  (NICODERM CQ  - DOSED IN MG/24 HOURS) 14 mg/24hr patch Place 1 patch (14 mg total) onto the skin daily. (Patient not taking: Reported on 09/01/2023)   risperiDONE  (RISPERDAL ) 3 MG tablet Take 1 tablet (3 mg total) by mouth at bedtime. (Patient not taking: Reported on 09/01/2023)   traZODone  (DESYREL ) 50 MG tablet Take 1 tablet (50 mg total) by mouth at bedtime as needed for sleep. (Patient not taking: Reported on 09/01/2023)   polyethylene  glycol (MIRALAX  / GLYCOLAX ) 17 g packet Take 17 g by mouth daily. (Patient not taking: Reported on 09/01/2023)   naltrexone  (DEPADE) 50 MG tablet Take 1 tablet (50 mg total) by mouth at bedtime. (Patient not taking: Reported on 09/01/2023)   umeclidinium-vilanterol (ANORO ELLIPTA ) 62.5-25 MCG/ACT AEPB Inhale 1 puff into the lungs daily. (Patient not taking: Reported on 09/01/2023)    Long Term Goals: Improvement in symptoms so as ready for discharge  Short Term Goals: Patient will verbalize feelings in meetings with treatment team members., Patient will attend at least of 50% of the groups daily., Pt will complete the PHQ9 on admission, day 3 and discharge., Patient will participate in completing the [place] Suicide Severity Rating Scale, Patient will score a low risk of violence for 24 hours prior to discharge, and Patient will take medications as prescribed daily.  Medical Decision Making  [facility] unit    Recommendations  Based on my evaluation the patient does not appear to have an emergency medical condition.  Gaither Pouch, NP 09/02/23  2:00 AM

## 2023-09-02 NOTE — ED Notes (Signed)
 Pt pleasant and cooperative, engaged in activities and thoughts seems clear. Denies self harm thoughts but endorses A/H.

## 2023-09-02 NOTE — BH Assessment (Signed)
 LCSW met with patient to assess current mood, affect, physical state, and inquire about needs/goals while here in [facility] and after discharge. Patient reports he presented to [facility] due to cocaine and alcohol use and needing detox and seeking residential treatment program.   Patient stated he was drinking a fifth of liquor daily and using one gram of crack cocaine daily. Patient stated he lives alone in his home and recently allowed his niece to say with him. Patient stated he had about 27 years sobriety combined with 10 years incarcerated and began back using crack and alcohol in 2010 and has not stopped since then. Patient also receives SSDI for heart issues. Patient does have a PCP at [facility] but reported he had not been taking any of his medications.   Patient reports his current goal is to seek residential placement for substance use. Patient denies any prior history of outpatient or inpatient substance abuse treatment. Patient currently denies any SI/HI/AVH.  Patient aware that LCSW will send referrals out for review and will follow up to provide updates as received. Patient expressed understanding and appreciation of LCSW assistance. No other needs were reported at this time by patient.

## 2023-09-03 DIAGNOSIS — R079 Chest pain, unspecified: Secondary | ICD-10-CM | POA: Diagnosis not present

## 2023-09-03 MED ORDER — NALTREXONE HCL 50 MG PO TABS
50.0000 mg | ORAL_TABLET | Freq: Every day | ORAL | Status: DC
  Administered 2023-09-03 – 2023-09-09 (×7): 50 mg via ORAL
  Filled 2023-09-03 (×7): qty 1

## 2023-09-03 NOTE — Group Note (Signed)
 Group Topic: Social Support  Group Date: 09/03/2023 Start Time: 1400 End Time: 1500 Facilitators: Nena Lesley PARAS, NT  Department: [facility]  Number of Participants: 6  Group Focus: check in, communication, and coping skills Treatment Modality:  Psychoeducation Interventions utilized were support Purpose: enhance coping skills and explore maladaptive thinking  Name: Raymond Mcclure Date of Birth: 1965-11-08  MR: [id number]    Level of Participation: PT did not attend group Quality of Participation: N/A Interactions with others: N/A Mood/Affect: N/A Triggers (if applicable): N/A Cognition: N/A Progress: None Response: PT did not attend group Plan: patient will be encouraged to come to future groups  Patients Problems:  Patient Active Problem List   Diagnosis Date Noted   Schizoaffective disorder, depressive type (HCC) 03/04/2023   Stimulant use disorder 03/04/2023   Alcohol use disorder, severe, dependence (HCC) 03/04/2023   Pulmonary nodules 02/12/2022   History of radiofrequency ablation procedure for cardiac arrhythmia 08/27/2021   Primary hypertension 08/27/2021   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

## 2023-09-03 NOTE — Group Note (Signed)
 Group Topic: Wellness  Group Date: 09/03/2023 Start Time: 0900 End Time: 1000 Facilitators: Winfred Byers, LPN; Herold Lajuana NOVAK, RN  Department: [facility]  Number of Participants: 9  Group Focus: nursing group Treatment Modality:  Psychoeducation Interventions utilized were clarification, patient education Purpose: increase insight  Name: Raymond Mcclure Date of Birth: Apr 04, 1965  MR: [id number]    Level of Participation: active Quality of Participation: attentive and cooperative Interactions with others: gave feedback Mood/Affect: appropriate Triggers (if applicable): N/A Cognition: coherent/clear and insightful Progress: Gaining insight Response: Patient verbalized understanding. Plan: patient will be encouraged to verbally express any questions or concerns they have about their medication and report any side effects they experience after taking their medications.  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

## 2023-09-03 NOTE — ED Notes (Signed)
 Pt observed in the dayroom and interacting appropriately on the milieu. He denies SI/HI. He stated that he always hears voices, but they are pleasant. He denies physical symptoms. He is seen eating and drinking fluids well. He is safe on the unit at this time with Q15 minute safety checks in place.

## 2023-09-03 NOTE — ED Notes (Signed)

## 2023-09-03 NOTE — ED Notes (Signed)
 Pt is observed watching television in the dayroom. Pt denies SI/HI. He states that he has been hearing voices since I was young, he states that they are pleasant. He voiced no physical complaints to this Clinical research associate. Pt is safe on the unit at this time with Q 15 min safety checks in place.

## 2023-09-03 NOTE — ED Notes (Signed)
 The patient is sitting in the dayroom, watching television, and socializing with other pts. No distress noted. Environment is secured. Plan of care ongoing, no further concerns as of present. Patient expresses no other needs at this time.
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
## 2023-09-03 NOTE — Group Note (Signed)
 Group Topic: Recovery Basics  Group Date: 09/03/2023 Start Time: 0800 End Time: 0900 Facilitators: Rolinda Monta ORN, NT  Department: [facility]  Number of Participants: 6  Group Focus: chemical dependency education, chemical dependency issues, co-dependency, and community group Treatment Modality:  Cognitive Behavioral Therapy Interventions utilized were patient education, story telling, and support Purpose: enhance coping skills, express feelings, and increase insight  Name: Raymond Mcclure Date of Birth: August 15, 1965  MR: [id number]    Level of Participation: active Quality of Participation: attentive, cooperative, and engaged Interactions with others: gave feedback Mood/Affect: bright and positive Triggers (if applicable): n/a Cognition: goal directed and insightful Progress: Gaining insight Response: n/a Plan: patient will be encouraged to attend all scheduled meetings.  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

## 2023-09-03 NOTE — ED Notes (Signed)
 Patient resting quietly in bed with eyes closed with unlabored breathing. Q 15 minute safety checks remain in place.  Pt remains safe on the unit at this time.

## 2023-09-04 DIAGNOSIS — R45851 Suicidal ideations: Secondary | ICD-10-CM

## 2023-09-04 DIAGNOSIS — F251 Schizoaffective disorder, depressive type: Secondary | ICD-10-CM

## 2023-09-04 DIAGNOSIS — R079 Chest pain, unspecified: Secondary | ICD-10-CM | POA: Diagnosis not present

## 2023-09-04 DIAGNOSIS — F1910 Other psychoactive substance abuse, uncomplicated: Secondary | ICD-10-CM | POA: Diagnosis not present

## 2023-09-04 MED ORDER — LOSARTAN POTASSIUM 50 MG PO TABS
25.0000 mg | ORAL_TABLET | Freq: Every day | ORAL | Status: DC
  Administered 2023-09-05 – 2023-09-09 (×5): 25 mg via ORAL
  Filled 2023-09-04 (×5): qty 1

## 2023-09-04 NOTE — ED Notes (Signed)
 Pt observed lying in bed. Eyes closed respirations even and non labored. NAD q 15 minute observations continue for safety.

## 2023-09-04 NOTE — ED Notes (Signed)
 Pt is observed watching television in the dayroom and eating snack. Pt denies SI/HI. He continues to endorse audible hallucinations as hearing voices that he says are pleasant. He voiced no physical complaints to this Clinical research associate. Pt is safe on the unit at this time with Q 15 min safety checks in place.

## 2023-09-04 NOTE — ED Provider Notes (Addendum)
 Behavioral Health Progress Note  Date and Time: 09/04/2023 2:41 PM Name: Raymond Mcclure MRN:  [id number]  ID:58 y/o male with a past psychiatric history of polysubstance abuse (cocaine, cannabis), with a bipolar 1 disorder.  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 presented to [facility] as a transfer from [facility] for substance abuse detox.  Patient with most recent admission to [facility] in 03/2023 and was discharge with diagnoses of :  Schizoaffective disorder, depressive type (HCC)   Stimulant use disorder   Alcohol use disorder, severe, dependence (HCC)  Patient was discharge at that time on Risperdal  1 mg BID, Lexapro  10 mg qdaily and Naltrexone  50 mg qdaily  Subjective:  Patient reports AH are no longer audible. Denies SI or HI. Patient with continued HTN intermittently however reports sensation of dizziness when standing. Denies headache, blurred vision or chest pain. Discussed orthostats and that I did not restart anything other than losartan  due to patient's poor compliance with medication prior to presentation. Will lower losartan  due to dizziness and increase as tolerated. Reports mood, sleep and appetite are improving and he denies SI, HI or AVH. Reports mood is depressed. Patient reports history of a suicide attempts in February 2025 when he overdosed on medications. Reports fair appetite and sleep.  Diagnosis:  Final diagnoses:  Polysubstance abuse (HCC)  Suicidal ideation  Schizoaffective disorder, depressive type with good prognostic features (HCC)    Total Time spent with patient: 30 minutes  Past Psychiatric History:  Prior diagnoses: Schizoaffective disorder, bipolar type, schiozaffective disorder, depressed type Prior  psychiatric  medications: Patient appears to be taking Wellbutrin  and naltrexone , Risperdal  1 mg BID Psychiatric medication history/compliance: Abilify , naltrexone , Wellbutrin  Psychiatric hospitalization(s): Patient last hospitalized at [facility] in 03/2023 as noted aovePreviously hospitalized 04/2021 at [facility] for suicide attempt (attempting to jump off a parking deck) and substance use. Psychotherapy history: none. Neuromodulation history: none. History of suicide (obtained from HPI): 4 previous suicide attempts, last 2 years ago (OD on fentanyl ) requiring hospitalization History of homicide or aggression: Per public offender registry, patient was imprisoned from 1999-2005.  Past Medical History:  Past Medical History:  Diagnosis Date   Cluster headaches    Drug abuse (HCC)    Hypertension    Migraines    Obesity    Tachycardia     Family History:  Daughter has bipolar disorder.  Cousin has a history of schizophrenia. Suicide history: denied.   Social History: children live with mother in [place] , limited social support. 2 sisters. Lives in his mother's house with niece and received disability.  Additional Social History:                         Sleep: Poor  Appetite:  Poor  Current Medications:  Current Facility-Administered Medications  Medication Dose Route Frequency Provider Last Rate Last Admin   acetaminophen  (TYLENOL ) tablet 650 mg  650 mg Oral Q6H PRN Trudy Carwin, NP       alum & mag hydroxide-simeth (MAALOX/MYLANTA) 200-200-20 MG/5ML suspension 30 mL  30 mL Oral Q4H PRN Trudy Carwin, NP       aspirin  chewable tablet 81 mg  81 mg Oral Daily Asenath Balash, MD   81 mg at 09/03/23 0941   atorvastatin  (LIPITOR) tablet 10 mg  10 mg Oral Daily Arleny Kruger,  Lakyia Behe, MD   10 mg at 09/04/23 1106   dicyclomine  (BENTYL ) tablet 20 mg  20 mg Oral Q6H PRN Trudy Carwin, NP       haloperidol  (HALDOL ) tablet 5 mg  5 mg Oral TID PRN Trudy Carwin, NP       And   diphenhydrAMINE  (BENADRYL )  capsule 50 mg  50 mg Oral TID PRN Trudy Carwin, NP       escitalopram  (LEXAPRO ) tablet 10 mg  10 mg Oral Daily Kayvan Hoefling, MD   10 mg at 09/04/23 1106   hydrOXYzine  (ATARAX ) tablet 25 mg  25 mg Oral Q6H PRN Trudy Carwin, NP   25 mg at 09/02/23 0207   loperamide  (IMODIUM ) capsule 2-4 mg  2-4 mg Oral PRN Trudy Carwin, NP       losartan  (COZAAR ) tablet 50 mg  50 mg Oral Daily Ashleen Demma, MD   50 mg at 09/04/23 1106   magnesium  hydroxide (MILK OF MAGNESIA) suspension 30 mL  30 mL Oral Daily PRN Trudy Carwin, NP       methocarbamol  (ROBAXIN ) tablet 500 mg  500 mg Oral Q8H PRN Trudy Carwin, NP       naltrexone  (DEPADE) tablet 50 mg  50 mg Oral Daily Jaysiah Marchetta, MD   50 mg at 09/04/23 1106   naproxen  (NAPROSYN ) tablet 500 mg  500 mg Oral BID PRN Trudy Carwin, NP       nicotine  (NICODERM CQ  - dosed in mg/24 hours) patch 14 mg  14 mg Transdermal Daily Shontia Gillooly, MD   14 mg at 09/04/23 1108   OLANZapine  (ZYPREXA ) injection 10 mg  10 mg Intramuscular TID PRN Trudy Carwin, NP       OLANZapine  (ZYPREXA ) injection 5 mg  5 mg Intramuscular TID PRN Trudy Carwin, NP       ondansetron  (ZOFRAN -ODT) disintegrating tablet 4 mg  4 mg Oral Q6H PRN Trudy Carwin, NP       pantoprazole  (PROTONIX ) EC tablet 40 mg  40 mg Oral Daily Verle Brillhart, MD   40 mg at 09/04/23 1106   risperiDONE  (RISPERDAL ) tablet 1 mg  1 mg Oral BID Caitlynne Harbeck, MD   1 mg at 09/04/23 1106   traZODone  (DESYREL ) tablet 50 mg  50 mg Oral QHS PRN Greidy Sherard, MD       umeclidinium-vilanterol (ANORO ELLIPTA ) 62.5-25 MCG/ACT 1 puff  1 puff Inhalation Daily Enza Shone, MD   1 puff at 09/04/23 1137   Current Outpatient Medications  Medication Sig Dispense Refill   atorvastatin  (LIPITOR) 10 MG tablet Take 1 tablet (10 mg total) by mouth daily. (Patient not taking: Reported on 09/01/2023) 30 tablet 0   [facility] ASPIRIN  ADULT LOW DOSE 81 MG chewable tablet Chew 1 tablet (81 mg total) by mouth daily. 30 tablet 0   escitalopram   (LEXAPRO ) 10 MG tablet Take 1 tablet (10 mg total) by mouth daily. (Patient not taking: Reported on 09/01/2023) 30 tablet 0   hydrOXYzine  (ATARAX ) 25 MG tablet Take 1 tablet (25 mg total) by mouth 3 (three) times daily as needed for anxiety. (Patient not taking: Reported on 09/01/2023) 45 tablet 0   isosorbide  mononitrate (IMDUR ) 30 MG 24 hr tablet Take 1 tablet (30 mg total) by mouth daily. (Patient not taking: Reported on 09/01/2023) 30 tablet 0   losartan  (COZAAR ) 50 MG tablet Take 1 tablet (50 mg total) by mouth daily. (Patient not taking: Reported on 09/01/2023) 30 tablet 0   naltrexone  (DEPADE) 50 MG tablet Take 1  tablet (50 mg total) by mouth at bedtime. (Patient not taking: Reported on 09/01/2023) 30 tablet 0   nicotine  (NICODERM CQ  - DOSED IN MG/24 HOURS) 14 mg/24hr patch Place 1 patch (14 mg total) onto the skin daily. (Patient not taking: Reported on 09/01/2023) 28 patch 0   polyethylene glycol (MIRALAX  / GLYCOLAX ) 17 g packet Take 17 g by mouth daily. (Patient not taking: Reported on 09/01/2023) 30 each 0   risperiDONE  (RISPERDAL ) 3 MG tablet Take 1 tablet (3 mg total) by mouth at bedtime. (Patient not taking: Reported on 09/01/2023) 30 tablet 0   traZODone  (DESYREL ) 50 MG tablet Take 1 tablet (50 mg total) by mouth at bedtime as needed for sleep. (Patient not taking: Reported on 09/01/2023) 15 tablet 0   umeclidinium-vilanterol (ANORO ELLIPTA ) 62.5-25 MCG/ACT AEPB Inhale 1 puff into the lungs daily. (Patient not taking: Reported on 09/01/2023) 30 each 0    Labs  Lab Results:  Admission on 09/01/2023, Discharged on 09/02/2023  Component Date Value Ref Range Status   Sodium 09/01/2023 140  135 - 145 mmol/L Final   Potassium 09/01/2023 4.2  3.5 - 5.1 mmol/L Final   Chloride 09/01/2023 108  98 - 111 mmol/L Final   CO2 09/01/2023 23  22 - 32 mmol/L Final   Glucose, Bld 09/01/2023 101 (H)  70 - 99 mg/dL Final   Glucose reference range applies only to samples taken after fasting for at least 8  hours.   BUN 09/01/2023 11  6 - 20 mg/dL Final   Creatinine, Ser 09/01/2023 1.05  0.61 - 1.24 mg/dL Final   Calcium  09/01/2023 9.5  8.9 - 10.3 mg/dL Final   Total Protein 92/68/7974 7.8  6.5 - 8.1 g/dL Final   Albumin 92/68/7974 3.9  3.5 - 5.0 g/dL Final   AST 92/68/7974 27  15 - 41 U/L Final   ALT 09/01/2023 25  0 - 44 U/L Final   Alkaline Phosphatase 09/01/2023 106  38 - 126 U/L Final   Total Bilirubin 09/01/2023 1.0  0.0 - 1.2 mg/dL Final   GFR, Estimated 09/01/2023 >60  >60 mL/min Final   Comment: (NOTE) Calculated using the CKD-EPI Creatinine Equation (2021)    Anion gap 09/01/2023 9  5 - 15 Final   Performed at [facility], [address], [place], [place] [address]   Alcohol, Ethyl (B) 09/01/2023 <15  <15 mg/dL Final   Comment: (NOTE) For medical purposes only. Performed at [facility], [address], [place], [place] [address]    WBC 09/01/2023 9.8  4.0 - 10.5 K/uL Final   RBC 09/01/2023 4.79  4.22 - 5.81 MIL/uL Final   Hemoglobin 09/01/2023 15.3  13.0 - 17.0 g/dL Final   HCT 92/68/7974 45.2  39.0 - 52.0 % Final   MCV 09/01/2023 94.4  80.0 - 100.0 fL Final   MCH 09/01/2023 31.9  26.0 - 34.0 pg Final   MCHC 09/01/2023 33.8  30.0 - 36.0 g/dL Final   RDW 92/68/7974 13.1  11.5 - 15.5 % Final   Platelets 09/01/2023 324  150 - 400 K/uL Final   nRBC 09/01/2023 0.0  0.0 - 0.2 % Final   Performed at [facility], [address], [place], [place] [address]   Opiates 09/01/2023 NONE DETECTED  NONE DETECTED Final   Cocaine 09/01/2023 POSITIVE (A)  NONE DETECTED Final   Benzodiazepines 09/01/2023 NONE DETECTED  NONE DETECTED Final   Amphetamines 09/01/2023 NONE DETECTED  NONE DETECTED Final  Tetrahydrocannabinol 09/01/2023 NONE DETECTED  NONE DETECTED Final   Barbiturates 09/01/2023 NONE DETECTED  NONE DETECTED Final   Comment: (NOTE) DRUG SCREEN FOR MEDICAL PURPOSES ONLY.  IF CONFIRMATION IS NEEDED FOR ANY PURPOSE,  NOTIFY LAB WITHIN 5 DAYS.  LOWEST DETECTABLE LIMITS FOR URINE DRUG SCREEN Drug Class                     Cutoff (ng/mL) Amphetamine and metabolites    1000 Barbiturate and metabolites    200 Benzodiazepine                 200 Opiates and metabolites        300 Cocaine and metabolites        300 THC                            50 Performed at [facility], [address] [place], [place] [address]   Admission on 03/03/2023, Discharged on 03/14/2023  Component Date Value Ref Range Status   Prolactin 03/08/2023 31.2 (H)  3.6 - 25.2 ng/mL Final   Comment: (NOTE) Performed At: [facility] [address] [place], [place] [id number] Jennette Shorter MD Ey:[id number]    SARS Coronavirus 2 by RT PCR 03/12/2023 POSITIVE (A)  NEGATIVE Final   Comment: (NOTE) SARS-CoV-2 target nucleic acids are DETECTED.  The SARS-CoV-2 RNA is generally detectable in upper respiratory specimens during the acute phase of infection. Positive results are indicative of the presence of the identified virus, but do not rule out bacterial infection or co-infection with other pathogens not detected by the test. Clinical correlation with patient history and other diagnostic information is necessary to determine patient infection status. The expected result is Negative.  Fact Sheet for Patients: BloggerCourse.com  Fact Sheet for Healthcare Providers: SeriousBroker.it  This test is not yet approved or cleared by the [place]  FDA and  has been authorized for detection and/or diagnosis of SARS-CoV-2 by FDA under an Emergency Use Authorization (EUA).  This EUA will remain in effect (meaning this test can be used) for the duration of  the COVID-19 declaration under Section 564(b)(1) of the A                          ct, 21 U.S.C. section 360bbb-3(b)(1), unless the authorization is terminated or revoked sooner.     Influenza A by  PCR 03/12/2023 NEGATIVE  NEGATIVE Final   Influenza B by PCR 03/12/2023 NEGATIVE  NEGATIVE Final   Comment: (NOTE) The Xpert Xpress SARS-CoV-2/FLU/RSV plus assay is intended as an aid in the diagnosis of influenza from Nasopharyngeal swab specimens and should not be used as a sole basis for treatment. Nasal washings and aspirates are unacceptable for Xpert Xpress SARS-CoV-2/FLU/RSV testing.  Fact Sheet for Patients: BloggerCourse.com  Fact Sheet for Healthcare Providers: SeriousBroker.it  This test is not yet approved or cleared by the [place]  FDA and has been authorized for detection and/or diagnosis of SARS-CoV-2 by FDA under an Emergency Use Authorization (EUA). This EUA will remain in effect (meaning this test can be used) for the duration of the COVID-19 declaration under Section 564(b)(1) of the Act, 21 U.S.C. section 360bbb-3(b)(1), unless the authorization is terminated or revoked.     Resp Syncytial Virus by PCR 03/12/2023 NEGATIVE  NEGATIVE Final   Comment: (NOTE) Fact Sheet for Patients: BloggerCourse.com  Fact Sheet for Healthcare Providers: SeriousBroker.it  This test is not yet approved or cleared by the [place]  FDA and has been authorized for detection and/or diagnosis of SARS-CoV-2 by FDA under an Emergency Use Authorization (EUA). This EUA will remain in effect (meaning this test can be used) for the duration of the COVID-19 declaration under Section 564(b)(1) of the Act, 21 U.S.C. section 360bbb-3(b)(1), unless the authorization is terminated or revoked.  Performed at [facility], [address], [place], [place] [address]     Blood Alcohol level:  Lab Results  Component Value Date   [facility] <15 09/01/2023   ETH <10 03/03/2023    Metabolic Disorder Labs: Lab Results  Component Value Date   HGBA1C 5.7 (H) 03/03/2023    MPG 116.89 03/03/2023   Lab Results  Component Value Date   PROLACTIN 31.2 (H) 03/08/2023   Lab Results  Component Value Date   CHOL 159 03/03/2023   TRIG 65 03/03/2023   HDL 39 (L) 03/03/2023   CHOLHDL 4.1 03/03/2023   VLDL 13 03/03/2023   LDLCALC 107 (H) 03/03/2023   LDLCALC (H) 07/15/2006    102        Total Cholesterol/HDL:CHD Risk Coronary Heart Disease Risk Table                     Men   Women  1/2 Average Risk   3.4   3.3    Therapeutic Lab Levels: No results found for: LITHIUM No results found for: VALPROATE No results found for: CBMZ  Physical Findings   AUDIT    Flowsheet Row [facility]  Alcohol Use Disorder Identification Test Final Score (AUDIT) 30   GAD-7    Flowsheet Row Office Visit from 08/13/2021 in [facility] [place]. [facility] Office Visit from 06/12/2021 in [facility]  Total GAD-7 Score 16 20   PHQ2-9    Flowsheet Row ED from 09/02/2023 in [facility] Office Visit from 02/10/2022 in [facility] - A Dept Of [place]. [facility] Office Visit from 09/09/2021 in [facility] - A Dept Of [place]. [facility] Office Visit from 08/27/2021 in [facility] - A Dept Of [place]. [facility] Office Visit from 08/20/2021 in [facility] - A Dept Of [place]. [facility]  PHQ-2 Total Score 3 0 3 1 6   PHQ-9 Total Score 14 -- 15 13 27    Flowsheet Row ED from 09/02/2023 in [facility] ED from 09/01/2023 in [facility] Emergency Department at [facility] Admission (Discharged) [facility]  C-SSRS RISK CATEGORY Low Risk High Risk High Risk     Musculoskeletal  Strength & Muscle Tone: within normal  limits Gait & Station: normal Patient leans: N/A  Psychiatric Specialty Exam  Presentation  General Appearance:  Casual; Appropriate for Environment  Eye Contact: Fair  Speech: Clear and Coherent  Speech Volume: Normal  Handedness: Right   Mood and Affect  Mood: Depressed  Affect: Congruent   Thought Process  Thought Processes: Coherent  Descriptions of Associations:Intact  Orientation:Full (Time, Place and Person)  Thought Content:WDL  Diagnosis of Schizophrenia or Schizoaffective disorder in past: yes  Hallucinations: denies AVH today Ideas of Reference:None  Suicidal Thoughts:denies SI today  Homicidal Thoughts:denies HI today   Sensorium  Memory: Immediate Fair  Judgment: Fair  Insight: Fair   Chartered certified accountant: Fair  Attention Span: Fair  Recall: [employer] of Knowledge: Fair  Language: Fair   Psychomotor Activity  Psychomotor Activity: No data recorded   Assets  Assets: Desire for Improvement; Resilience; Vocational/Educational   Sleep  Sleep: No data recorded  Estimated Sleeping Duration (Last 24 Hours): 14.00-15.50 hours  No data recorded   Physical Exam   General: Well developed, well nourished, overweight [place] male  Pupils: Normal at 3mm Respiratory: Breathing is unlabored.  Cardiovascular: No edema.  Language: No anomia, no aphasia Muscle strength and tone-pt moving all extremities.  Gait not assessed as pt remained in bed.  Neuro: Facial muscles are symmetric. Pt without tremor, no evidence of hyperarousal.  Review of Systems  Constitutional: Negative.   HENT: Negative.    Eyes: Negative.   Respiratory: Negative.    Cardiovascular: Negative.   Gastrointestinal: Negative.   Genitourinary: Negative.   Musculoskeletal: Negative.   Skin: Negative.   Neurological: Negative.   Endo/Heme/Allergies: Negative.    Blood pressure (!) 146/90, pulse 91, temperature 98.1  F (36.7 C), temperature source Oral, resp. rate 16, SpO2 97%. There is no height or weight on file to calculate BMI.  Treatment Plan Summary: Daily contact with patient to assess and evaluate symptoms and progress in treatment, Medication management, and Plan as below  58 y/o male with a past psychiatric history of polysubstance abuse (cocaine, cannabis), with a bipolar 1 disorder.  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 presented to [facility] as a transfer from [facility] for substance abuse detox.  8/1: Patient presenting with worsening depression, SI and AVH in context of cocaine abuse and medication noncompliance. Reports prior benefit from medications given at last discharge from [facility] and would like to restart. Multiple prior suicide attempt and I will complete second exam for IVC due to elevated acute risk for suicide. Denies suicidal plan or intent today. Denies Command AH today.  8/2: tolerating medications well and reporting improvement in mood. Denies SI or AVH today. Patient reports depression. Agreeable to restarting naltrexone  today. BP is improving  8/3: PATIENT intermittently normotensive and hypertension. Reports dizziness when standing and would like to try lower dose of losartan  to see if this resolves. Has not taken losartan  and Imdur  for many months and noncompliant with followup. Denies SI, HI or AVH today. Reports benefit from restarting Risperdal  and Lexapro .  #Schizoaffective disorder depressed type -cont Risperdal  1 mg BID -cont Lexapro  10 mg qdaily  #cocaine abuse -inpatient rehab referral  #Alcohol abuse - CIWA of zero at this time and patient denies any withdrawal symptoms Restart naltrexone  50 mg qdaily as patient notes this  has previously been helpful  Medical -Asa 81 mg daily for CAD, Atorvastatin  for HLD, inhaler restarted,   HTN- reduce Losartan  to 25 qdaily for HTN due to patient feeling dizzy (will increase further as tolerated),  patient states he has not followed up or taken any of his medical medications for months, will resume Cardizem  IMDUR  only if if above medications are tolerated well Regginald Pask, MD 09/04/2023 2:41 PM

## 2023-09-04 NOTE — Group Note (Signed)
 Group Topic: Healthy Self Image and Positive Change  Group Date: 09/04/2023 Start Time: 2000 End Time: 2030 Facilitators: Verdon Jacqualyn BRAVO, NT  Department: [facility]  Number of Participants: 10  Group Focus: self-awareness Treatment Modality:  Individual Therapy Interventions utilized were group exercise Purpose: reinforce self-care  Name: Raymond Mcclure Date of Birth: 12/21/1965  MR: [id number]    Level of Participation: n/a Quality of Participation: n/a Interactions with others: n/a Mood/Affect: n/a Triggers (if applicable): n/a Cognition: n/a Progress: Other Response: n/a Plan: patient will be encouraged to participate in group  Patients Problems:  Patient Active Problem List   Diagnosis Date Noted   Schizoaffective disorder, depressive type (HCC) 03/04/2023   Stimulant use disorder 03/04/2023   Alcohol use disorder, severe, dependence (HCC) 03/04/2023   Pulmonary nodules 02/12/2022   History of radiofrequency ablation procedure for cardiac arrhythmia 08/27/2021   Primary hypertension 08/27/2021   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

## 2023-09-04 NOTE — ED Notes (Signed)
 Patient resting quietly in bed with eyes closed with unlabored breathing. Q 15 minute safety checks remain in place.  Pt remains safe on the unit at this time.

## 2023-09-04 NOTE — Group Note (Signed)
 Group Topic: Social Support  Group Date: 09/04/2023 Start Time: 0830 End Time: 0910 Facilitators: Celinda Suzen NOVAK, NT  Department: [facility]  Number of Participants: 7  Group Focus: goals/reality orientation Treatment Modality:  Psychoeducation Interventions utilized were support Purpose: increase insight and regain self-worth  Name: Raymond Mcclure Date of Birth: 06-May-1965  MR: [id number]    Level of Participation: PT did not attend group Quality of Participation: No participation Interactions with others: No interaction Mood/Affect: n/a Triggers (if applicable): n/a Cognition: n/a Progress: Other Response: No response Plan: follow-up needed and patient will be encouraged to attend group  Patients Problems:  Patient Active Problem List   Diagnosis Date Noted   Schizoaffective disorder, depressive type (HCC) 03/04/2023   Stimulant use disorder 03/04/2023   Alcohol use disorder, severe, dependence (HCC) 03/04/2023   Pulmonary nodules 02/12/2022   History of radiofrequency ablation procedure for cardiac arrhythmia 08/27/2021   Primary hypertension 08/27/2021   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

## 2023-09-04 NOTE — ED Notes (Signed)
 Pt has been observable in milieu.  Reported feeling nauseous.  Received saltine crackers and ginger ale.  Endorses voices.  Denies command hallucinations. Denied current SI plan and intent.   Q 15 minute observations for safety continue

## 2023-09-05 DIAGNOSIS — R079 Chest pain, unspecified: Secondary | ICD-10-CM | POA: Diagnosis not present

## 2023-09-05 MED ORDER — MELATONIN 5 MG PO TABS
5.0000 mg | ORAL_TABLET | Freq: Every day | ORAL | Status: DC
  Administered 2023-09-05 – 2023-09-09 (×5): 5 mg via ORAL
  Filled 2023-09-05 (×6): qty 1

## 2023-09-05 NOTE — Discharge Planning (Signed)
 SW received call from [facility] stating he was denied based on mental health concerns. Received call from [facility] who stated that patient stated he used a CPAP and does not have. SW spoke with patient and he stated that he was supposed to use it but has not in a very long time and this machine was in [place]. Per Meagan at [facility] he would not be able to come unless his medical orders or documentation indicated he no longer needed this DME. SW made him aware and will continue to send out referrals to other non local programs per patients agreement.

## 2023-09-05 NOTE — ED Notes (Signed)
 Pt is observable in milieu.   Denied SI and HI.  Pt endorses hearing voices at baseline.  Observed interacting with peers appropriately Q 15 minute observations for safety continue

## 2023-09-05 NOTE — ED Provider Notes (Signed)
 Behavioral Health Progress Note  Date and Time: 09/05/2023 12:19 PM Name: Raymond Mcclure MRN:  [id number]  Subjective:  Raymond Mcclure was seen in the common area on rounds. He feels that he is doing good overall, but is still having some cravings. His sleep is still not restful. He is eating okay. Nausea and vomiting have results. He is still hearing voices but they are less disruptive. He is hoping for residential treatment.   Diagnosis:  Final diagnoses:  Polysubstance abuse (HCC)  Suicidal ideation  Schizoaffective disorder, depressive type with good prognostic features (HCC)    Total Time spent with patient: 15 minutes  Past Psychiatric History: Prior diagnoses: Schizoaffective disorder, bipolar type, schiozaffective disorder, depressed type Prior  psychiatric medications: Patient appears to be taking Wellbutrin  and naltrexone , Risperdal  1 mg BID Psychiatric medication history/compliance: Abilify , naltrexone , Wellbutrin  Psychiatric hospitalization(s): Patient last hospitalized at [facility] in 03/2023 as noted aovePreviously hospitalized 04/2021 at [facility] for suicide attempt (attempting to jump off a parking deck) and substance use. Psychotherapy history: none. Neuromodulation history: none. History of suicide (obtained from HPI): 4 previous suicide attempts, last 2 years ago (OD on fentanyl ) requiring hospitalization History of homicide or aggression: Per public offender registry, patient was imprisoned from 1999-2005. Past Medical History: see chart  Family History:  Daughter has bipolar disorder.  Cousin has a history of schizophrenia. Suicide history: denied.    Social History: children live with mother in [place] , limited social support. 2 sisters. Lives in his mother's house with niece and received disability.    Sleep: Poor  Appetite:  Fair  Current Medications:  Current Facility-Administered Medications  Medication Dose Route Frequency Provider Last Rate Last Admin    acetaminophen  (TYLENOL ) tablet 650 mg  650 mg Oral Q6H PRN Trudy Carwin, NP       alum & mag hydroxide-simeth (MAALOX/MYLANTA) 200-200-20 MG/5ML suspension 30 mL  30 mL Oral Q4H PRN Trudy Carwin, NP       aspirin  chewable tablet 81 mg  81 mg Oral Daily Zouev, Dmitri, MD   81 mg at 09/05/23 0957   atorvastatin  (LIPITOR) tablet 10 mg  10 mg Oral Daily Zouev, Dmitri, MD   10 mg at 09/05/23 0957   dicyclomine  (BENTYL ) tablet 20 mg  20 mg Oral Q6H PRN Trudy Carwin, NP       haloperidol  (HALDOL ) tablet 5 mg  5 mg Oral TID PRN Trudy Carwin, NP       And   diphenhydrAMINE  (BENADRYL ) capsule 50 mg  50 mg Oral TID PRN Trudy Carwin, NP       escitalopram  (LEXAPRO ) tablet 10 mg  10 mg Oral Daily Zouev, Dmitri, MD   10 mg at 09/05/23 0957   hydrOXYzine  (ATARAX ) tablet 25 mg  25 mg Oral Q6H PRN Trudy Carwin, NP   25 mg at 09/02/23 0207   loperamide  (IMODIUM ) capsule 2-4 mg  2-4 mg Oral PRN Trudy Carwin, NP       losartan  (COZAAR ) tablet 25 mg  25 mg Oral Daily Zouev, Dmitri, MD   25 mg at 09/05/23 0957   magnesium  hydroxide (MILK OF MAGNESIA) suspension 30 mL  30 mL Oral Daily PRN Trudy Carwin, NP       methocarbamol  (ROBAXIN ) tablet 500 mg  500 mg Oral Q8H PRN Trudy Carwin, NP       naltrexone  (DEPADE) tablet 50 mg  50 mg Oral Daily Zouev, Dmitri, MD   50 mg at 09/05/23 0957   naproxen  (NAPROSYN ) tablet 500 mg  500 mg Oral BID PRN Trudy Carwin, NP       nicotine  (NICODERM CQ  - dosed in mg/24 hours) patch 14 mg  14 mg Transdermal Daily Zouev, Dmitri, MD   14 mg at 09/05/23 1002   OLANZapine  (ZYPREXA ) injection 10 mg  10 mg Intramuscular TID PRN Trudy Carwin, NP       OLANZapine  (ZYPREXA ) injection 5 mg  5 mg Intramuscular TID PRN Trudy Carwin, NP       ondansetron  (ZOFRAN -ODT) disintegrating tablet 4 mg  4 mg Oral Q6H PRN Trudy Carwin, NP       pantoprazole  (PROTONIX ) EC tablet 40 mg  40 mg Oral Daily Zouev, Dmitri, MD   40 mg at 09/05/23 0957   risperiDONE  (RISPERDAL ) tablet 1 mg  1 mg Oral BID  Zouev, Dmitri, MD   1 mg at 09/05/23 0957   traZODone  (DESYREL ) tablet 50 mg  50 mg Oral QHS PRN Zouev, Dmitri, MD       umeclidinium-vilanterol (ANORO ELLIPTA ) 62.5-25 MCG/ACT 1 puff  1 puff Inhalation Daily Zouev, Dmitri, MD   1 puff at 09/05/23 1001   Current Outpatient Medications  Medication Sig Dispense Refill   atorvastatin  (LIPITOR) 10 MG tablet Take 1 tablet (10 mg total) by mouth daily. (Patient not taking: Reported on 09/01/2023) 30 tablet 0   [facility] ASPIRIN  ADULT LOW DOSE 81 MG chewable tablet Chew 1 tablet (81 mg total) by mouth daily. 30 tablet 0   escitalopram  (LEXAPRO ) 10 MG tablet Take 1 tablet (10 mg total) by mouth daily. (Patient not taking: Reported on 09/01/2023) 30 tablet 0   hydrOXYzine  (ATARAX ) 25 MG tablet Take 1 tablet (25 mg total) by mouth 3 (three) times daily as needed for anxiety. (Patient not taking: Reported on 09/01/2023) 45 tablet 0   isosorbide  mononitrate (IMDUR ) 30 MG 24 hr tablet Take 1 tablet (30 mg total) by mouth daily. (Patient not taking: Reported on 09/01/2023) 30 tablet 0   losartan  (COZAAR ) 50 MG tablet Take 1 tablet (50 mg total) by mouth daily. (Patient not taking: Reported on 09/01/2023) 30 tablet 0   naltrexone  (DEPADE) 50 MG tablet Take 1 tablet (50 mg total) by mouth at bedtime. (Patient not taking: Reported on 09/01/2023) 30 tablet 0   nicotine  (NICODERM CQ  - DOSED IN MG/24 HOURS) 14 mg/24hr patch Place 1 patch (14 mg total) onto the skin daily. (Patient not taking: Reported on 09/01/2023) 28 patch 0   polyethylene glycol (MIRALAX  / GLYCOLAX ) 17 g packet Take 17 g by mouth daily. (Patient not taking: Reported on 09/01/2023) 30 each 0   risperiDONE  (RISPERDAL ) 3 MG tablet Take 1 tablet (3 mg total) by mouth at bedtime. (Patient not taking: Reported on 09/01/2023) 30 tablet 0   traZODone  (DESYREL ) 50 MG tablet Take 1 tablet (50 mg total) by mouth at bedtime as needed for sleep. (Patient not taking: Reported on 09/01/2023) 15 tablet 0   umeclidinium-vilanterol  (ANORO ELLIPTA ) 62.5-25 MCG/ACT AEPB Inhale 1 puff into the lungs daily. (Patient not taking: Reported on 09/01/2023) 30 each 0    Labs  Lab Results:  Admission on 09/01/2023, Discharged on 09/02/2023  Component Date Value Ref Range Status   Sodium 09/01/2023 140  135 - 145 mmol/L Final   Potassium 09/01/2023 4.2  3.5 - 5.1 mmol/L Final   Chloride 09/01/2023 108  98 - 111 mmol/L Final   CO2 09/01/2023 23  22 - 32 mmol/L Final   Glucose, Bld 09/01/2023 101 (H)  70 - 99 mg/dL  Final   Glucose reference range applies only to samples taken after fasting for at least 8 hours.   BUN 09/01/2023 11  6 - 20 mg/dL Final   Creatinine, Ser 09/01/2023 1.05  0.61 - 1.24 mg/dL Final   Calcium  09/01/2023 9.5  8.9 - 10.3 mg/dL Final   Total Protein 92/68/7974 7.8  6.5 - 8.1 g/dL Final   Albumin 92/68/7974 3.9  3.5 - 5.0 g/dL Final   AST 92/68/7974 27  15 - 41 U/L Final   ALT 09/01/2023 25  0 - 44 U/L Final   Alkaline Phosphatase 09/01/2023 106  38 - 126 U/L Final   Total Bilirubin 09/01/2023 1.0  0.0 - 1.2 mg/dL Final   GFR, Estimated 09/01/2023 >60  >60 mL/min Final   Comment: (NOTE) Calculated using the CKD-EPI Creatinine Equation (2021)    Anion gap 09/01/2023 9  5 - 15 Final   Performed at [facility], [address] [place], [place] [address]   Alcohol, Ethyl (B) 09/01/2023 <15  <15 mg/dL Final   Comment: (NOTE) For medical purposes only. Performed at [facility], [address], [place], [place] [address]    WBC 09/01/2023 9.8  4.0 - 10.5 K/uL Final   RBC 09/01/2023 4.79  4.22 - 5.81 MIL/uL Final   Hemoglobin 09/01/2023 15.3  13.0 - 17.0 g/dL Final   HCT 92/68/7974 45.2  39.0 - 52.0 % Final   MCV 09/01/2023 94.4  80.0 - 100.0 fL Final   MCH 09/01/2023 31.9  26.0 - 34.0 pg Final   MCHC 09/01/2023 33.8  30.0 - 36.0 g/dL Final   RDW 92/68/7974 13.1  11.5 - 15.5 % Final   Platelets 09/01/2023 324  150 - 400 K/uL Final   nRBC 09/01/2023 0.0  0.0 - 0.2  % Final   Performed at [facility], [address] [place], [place] [address]   Opiates 09/01/2023 NONE DETECTED  NONE DETECTED Final   Cocaine 09/01/2023 POSITIVE (A)  NONE DETECTED Final   Benzodiazepines 09/01/2023 NONE DETECTED  NONE DETECTED Final   Amphetamines 09/01/2023 NONE DETECTED  NONE DETECTED Final   Tetrahydrocannabinol 09/01/2023 NONE DETECTED  NONE DETECTED Final   Barbiturates 09/01/2023 NONE DETECTED  NONE DETECTED Final   Comment: (NOTE) DRUG SCREEN FOR MEDICAL PURPOSES ONLY.  IF CONFIRMATION IS NEEDED FOR ANY PURPOSE, NOTIFY LAB WITHIN 5 DAYS.  LOWEST DETECTABLE LIMITS FOR URINE DRUG SCREEN Drug Class                     Cutoff (ng/mL) Amphetamine and metabolites    1000 Barbiturate and metabolites    200 Benzodiazepine                 200 Opiates and metabolites        300 Cocaine and metabolites        300 THC                            50 Performed at [facility], [address], [place], [place] [address]   Admission on 03/03/2023, Discharged on 03/14/2023  Component Date Value Ref Range Status   Prolactin 03/08/2023 31.2 (H)  3.6 - 25.2 ng/mL Final   Comment: (NOTE) Performed At: [facility] [address] [place], [place] [id number] Jennette Shorter MD Ey:[id number]    SARS Coronavirus 2 by RT PCR 03/12/2023 POSITIVE (A)  NEGATIVE Final  Comment: (NOTE) SARS-CoV-2 target nucleic acids are DETECTED.  The SARS-CoV-2 RNA is generally detectable in upper respiratory specimens during the acute phase of infection. Positive results are indicative of the presence of the identified virus, but do not rule out bacterial infection or co-infection with other pathogens not detected by the test. Clinical correlation with patient history and other diagnostic information is necessary to determine patient infection status. The expected result is Negative.  Fact Sheet for  Patients: BloggerCourse.com  Fact Sheet for Healthcare Providers: SeriousBroker.it  This test is not yet approved or cleared by the [place]  FDA and  has been authorized for detection and/or diagnosis of SARS-CoV-2 by FDA under an Emergency Use Authorization (EUA).  This EUA will remain in effect (meaning this test can be used) for the duration of  the COVID-19 declaration under Section 564(b)(1) of the A                          ct, 21 U.S.C. section 360bbb-3(b)(1), unless the authorization is terminated or revoked sooner.     Influenza A by PCR 03/12/2023 NEGATIVE  NEGATIVE Final   Influenza B by PCR 03/12/2023 NEGATIVE  NEGATIVE Final   Comment: (NOTE) The Xpert Xpress SARS-CoV-2/FLU/RSV plus assay is intended as an aid in the diagnosis of influenza from Nasopharyngeal swab specimens and should not be used as a sole basis for treatment. Nasal washings and aspirates are unacceptable for Xpert Xpress SARS-CoV-2/FLU/RSV testing.  Fact Sheet for Patients: BloggerCourse.com  Fact Sheet for Healthcare Providers: SeriousBroker.it  This test is not yet approved or cleared by the [place]  FDA and has been authorized for detection and/or diagnosis of SARS-CoV-2 by FDA under an Emergency Use Authorization (EUA). This EUA will remain in effect (meaning this test can be used) for the duration of the COVID-19 declaration under Section 564(b)(1) of the Act, 21 U.S.C. section 360bbb-3(b)(1), unless the authorization is terminated or revoked.     Resp Syncytial Virus by PCR 03/12/2023 NEGATIVE  NEGATIVE Final   Comment: (NOTE) Fact Sheet for Patients: BloggerCourse.com  Fact Sheet for Healthcare Providers: SeriousBroker.it  This test is not yet approved or cleared by the [place]  FDA and has been authorized for detection  and/or diagnosis of SARS-CoV-2 by FDA under an Emergency Use Authorization (EUA). This EUA will remain in effect (meaning this test can be used) for the duration of the COVID-19 declaration under Section 564(b)(1) of the Act, 21 U.S.C. section 360bbb-3(b)(1), unless the authorization is terminated or revoked.  Performed at [facility], [address], [place], [place] [address]     Blood Alcohol level:  Lab Results  Component Value Date   [facility] <15 09/01/2023   ETH <10 03/03/2023    Metabolic Disorder Labs: Lab Results  Component Value Date   HGBA1C 5.7 (H) 03/03/2023   MPG 116.89 03/03/2023   Lab Results  Component Value Date   PROLACTIN 31.2 (H) 03/08/2023   Lab Results  Component Value Date   CHOL 159 03/03/2023   TRIG 65 03/03/2023   HDL 39 (L) 03/03/2023   CHOLHDL 4.1 03/03/2023   VLDL 13 03/03/2023   LDLCALC 107 (H) 03/03/2023   LDLCALC (H) 07/15/2006    102        Total Cholesterol/HDL:CHD Risk Coronary Heart Disease Risk Table                     Men   Women  1/2 Average Risk   3.4   3.3    Therapeutic Lab Levels: No results found for: LITHIUM No results found for: VALPROATE No results found for: CBMZ  Physical Findings   AUDIT    Flowsheet Row [facility]  Alcohol Use Disorder Identification Test Final Score (AUDIT) 30   GAD-7    Flowsheet Row Office Visit from 08/13/2021 in [facility] Of [place]. [facility] Office Visit from 06/12/2021 in [facility] [place]  Total GAD-7 Score 16 20   PHQ2-9    Flowsheet Row ED from 09/02/2023 in [facility] Office Visit from 02/10/2022 in [facility] Of [place]. [facility] Office Visit from 09/09/2021 in [facility] - A Dept Of [place]. [facility] Office Visit from  08/27/2021 in [facility] - A Dept Of [place]. [facility] Office Visit from 08/20/2021 in [facility]  PHQ-2 Total Score 3 0 3 1 6   PHQ-9 Total Score 14 -- 15 13 27    Flowsheet Row ED from 09/02/2023 in [facility] ED from 09/01/2023 in  [facility] Admission (Discharged) [facility]  C-SSRS RISK CATEGORY Low Risk High Risk High Risk     Musculoskeletal  Strength & Muscle Tone: within normal limits Gait & Station: normal Patient leans: N/A  Psychiatric Specialty Exam  Presentation  General Appearance:  Casual  Eye Contact: Good  Speech: Normal Rate  Speech Volume: Normal  Handedness: Right   Mood and Affect  Mood: Euthymic  Affect: Blunt   Thought Process  Thought Processes: Linear  Descriptions of Associations:Intact  Orientation:Full (Time, Place and Person)  Thought Content:Logical  Diagnosis of Schizophrenia or Schizoaffective disorder in past: No data recorded   Hallucinations:Hallucinations: Auditory Description of Auditory Hallucinations: non-command  Ideas of Reference:None  Suicidal Thoughts:Suicidal Thoughts: No  Homicidal Thoughts:Homicidal Thoughts: No   Sensorium  Memory: Immediate Good; Recent Fair; Remote Fair  Judgment: Fair  Insight: Fair   Art therapist  Concentration: Fair  Attention Span: Fair  Recall: Fair  Fund of Knowledge: Fair  Language: Good   Psychomotor Activity  Psychomotor Activity: Psychomotor Activity: Normal   Assets  Assets: Desire for Improvement   Sleep  Sleep: Sleep: Poor  Estimated Sleeping Duration (Last 24 Hours): 11.25-14.00 hours  No data recorded  Physical Exam  Physical Exam ROS Blood pressure (!) 158/80, pulse 85, temperature 98.1 F (36.7 C),  temperature source Oral, resp. rate 18, SpO2 97%. There is no height or weight on file to calculate BMI.  Treatment Plan Summary: Treatment Plan Summary: Daily contact with patient to assess and evaluate symptoms and progress in treatment, Medication management, and Plan as below   58 y/o male with a past psychiatric history of polysubstance abuse (cocaine, cannabis), with a bipolar 1 disorder.  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 [facility] to [facility] transfer from [facility] for substance abuse detox.   8/1: Patient presenting with worsening depression, SI and AVH in context  of cocaine abuse and medication noncompliance. Reports prior benefit from medications given at last discharge from [facility] and would like to restart. Multiple prior suicide attempt and I will complete second exam for IVC due to elevated acute risk for suicide. Denies suicidal plan or intent today. Denies Command AH today.   8/2: tolerating medications well and reporting improvement in mood. Denies SI or AVH today. Patient reports depression. Agreeable to restarting naltrexone  today. BP is improving   8/3: PATIENT intermittently normotensive and hypertension. Reports dizziness when standing and would like to try lower dose of losartan  to see if this resolves. Has not taken losartan  and Imdur  for many months and noncompliant with followup. Denies SI, HI or AVH today. Reports benefit from restarting Risperdal  and Lexapro .   #Schizoaffective disorder depressed type -cont Risperdal  1 mg BID -cont Lexapro  10 mg qdaily   #cocaine abuse -inpatient rehab referral   #Alcohol abuse - CIWA of zero at this time and patient denies any withdrawal symptoms Restart naltrexone  50 mg qdaily as  patient notes this has previously been helpful   Medical -Asa 81 mg daily for CAD, Atorvastatin  for HLD, inhaler restarted,    HTN- reduced Losartan  to 25 qdaily for HTN due to patient feeling dizzy (will increase further as tolerated),  patient states he has not followed up or taken any of his medical medications for months, will resume Cardizem  IMDUR  only if if above medications are tolerated well  Corean Anette Potters, MD 09/05/2023 12:19 PM

## 2023-09-05 NOTE — ED Notes (Signed)
 Pt presents sitting in dayroom eating breakfast.  Reports feeling better this AM Q 15 minute observation for safety continue

## 2023-09-05 NOTE — ED Notes (Signed)
 Patient resting quietly in bed with eyes closed and unlabored breathing. In no acute distress. Q 15 minute safety checks remain in place.

## 2023-09-05 NOTE — ED Notes (Signed)
 Patient resting quietly in bed with eyes closed with unlabored breathing. Q 15 minute safety checks remain in place.

## 2023-09-05 NOTE — Group Note (Signed)
 Group Topic: Coping Skills  Group Date: 09/05/2023 Start Time: 0300 End Time: 0400 Facilitators: [place] Stephane HERO, [place]  Department: [facility]  Number of Participants: 6  Group Focus: activities of daily living skills, affirmation, anger management, anxiety, and coping skills Treatment Modality:  Psychoeducation Interventions utilized were exploration, mental fitness, patient education, and support Purpose: enhance coping skills, explore maladaptive thinking, express feelings, and reinforce self-care  Name: Raymond Mcclure Date of Birth: 05/11/1965  MR: [id number]    Level of Participation: when cued Quality of Participation: attentive and cooperative Interactions with others: was quiet but attentive Mood/Affect: appropriate, bright, and positive Triggers (if applicable): n/a Cognition: coherent/clear, concrete, and insightful Progress: Moderate Response: was engaged but quiet Plan: patient will be encouraged to give feedback  Patients Problems:  Patient Active Problem List   Diagnosis Date Noted   Schizoaffective disorder, depressive type (HCC) 03/04/2023   Stimulant use disorder 03/04/2023   Alcohol use disorder, severe, dependence (HCC) 03/04/2023   Pulmonary nodules 02/12/2022   History of radiofrequency ablation procedure for cardiac arrhythmia 08/27/2021   Primary hypertension 08/27/2021   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

## 2023-09-05 NOTE — Group Note (Signed)
 Group Topic: Change and Accountability  Group Date: 09/05/2023 Start Time: 2000 End Time: 2030 Facilitators: Anice Benton LABOR, NT  Department: [facility]  Number of Participants: 5  Group Focus: psychiatric education Treatment Modality:  Patient-Centered Therapy Interventions utilized were assignment Purpose: explore maladaptive thinking  Name: Raymond Mcclure Date of Birth: [birth year]  MR: [id number]    Level of Participation: active Quality of Participation: cooperative Interactions with others: gave feedback Mood/Affect: appropriate Triggers (if applicable): None Cognition: coherent/clear Progress: Moderate Response: Good Plan: follow-up needed  Patients Problems:  Patient Active Problem List   Diagnosis Date Noted   Schizoaffective disorder, depressive type (HCC) 03/04/2023   Stimulant use disorder 03/04/2023   Alcohol use disorder, severe, dependence (HCC) 03/04/2023   Pulmonary nodules 02/12/2022   History of radiofrequency ablation procedure for cardiac arrhythmia 08/27/2021   Primary hypertension 08/27/2021   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

## 2023-09-05 NOTE — Group Note (Signed)
 Group Topic: Wellness  Group Date: 09/05/2023 Start Time: 1030 End Time: 1100 Facilitators: Carletha Iha, RN  Department: [facility]  Number of Participants: 7  Group Focus: nursing group Treatment Modality:  Psychoeducation Interventions utilized were patient education Purpose: Medication education  Name: Raymond Mcclure Date of Birth: [birth year]  MR: [id number]    Level of Participation: active Quality of Participation: cooperative Interactions with others: gave feedback Mood/Affect: appropriate Triggers (if applicable):  Cognition: coherent/clear Progress: Gaining insight Response:  Plan: patient will be encouraged to patient will be encouraged to continue to take medications after discharge  Patients Problems:  Patient Active Problem List   Diagnosis Date Noted   Schizoaffective disorder, depressive type (HCC) 03/04/2023   Stimulant use disorder 03/04/2023   Alcohol use disorder, severe, dependence (HCC) 03/04/2023   Pulmonary nodules 02/12/2022   History of radiofrequency ablation procedure for cardiac arrhythmia 08/27/2021   Primary hypertension 08/27/2021   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

## 2023-09-05 NOTE — ED Notes (Signed)
 Pt noted to be OOB majority of shift.  Pt also noted attending groups.  Calm and pleasant.   Q 15 minute observations for safety continue

## 2023-09-06 DIAGNOSIS — R079 Chest pain, unspecified: Secondary | ICD-10-CM | POA: Diagnosis not present

## 2023-09-06 NOTE — ED Notes (Signed)
 Pt is A & O x 4. Pt is oriented to the unit and provided with meal. Medication has been administered to him and pt is lying on his bed. Pt denies SI/HI/AVH.

## 2023-09-06 NOTE — ED Notes (Signed)
 Pt endorses auditory hallucinations where  he hears voices and sound which are pleasant to his hearing. Pt reports hearing his phone ringing as well as he making a call when he is sleeping.

## 2023-09-06 NOTE — ED Notes (Signed)
 Patient is sleeping. Respirations equal and unlabored. No change in assessment or acuity. Routine safety checks conducted according to facility protocol.

## 2023-09-06 NOTE — ED Provider Notes (Signed)
 Behavioral Health Progress Note  Date and Time: 09/06/2023 10:51 AM Name: Raymond Mcclure MRN:  [id number]  Subjective:  Vedanth was seen in the common area on rounds. He is sleeping and eating okay. No medication side effects today. He has not had any hallucinations so far today. No current thoughts of harm to self or others. Future orientation is improved. He feels overall fine, and is hoping for residential rehab.   Diagnosis:  Final diagnoses:  Polysubstance abuse (HCC)  Suicidal ideation  Schizoaffective disorder, depressive type with good prognostic features (HCC)    Total Time spent with patient: 15 minutes   Past Psychiatric History: Prior diagnoses: Schizoaffective disorder, bipolar type, schiozaffective disorder, depressed type Prior  psychiatric medications: Patient appears to be taking Wellbutrin  and naltrexone , Risperdal  1 mg BID Psychiatric medication history/compliance: Abilify , naltrexone , Wellbutrin  Psychiatric hospitalization(s): Patient last hospitalized at [facility] in 03/2023 as noted aovePreviously hospitalized 04/2021 at [facility] for suicide attempt (attempting to jump off a parking deck) and substance use. History of suicide (obtained from HPI): 4 previous suicide attempts, last 2 years ago (OD on fentanyl ) requiring hospitalization History of homicide or aggression: Per public offender registry, patient was imprisoned from 1999-2005. Past Medical History: see chart  Family History:  Daughter has bipolar disorder.  Cousin has a history of schizophrenia. Suicide history: denied.    Social History: children live with mother in [place] , limited social support. 2 sisters. Lives in his mother's house with niece and received disability.  Sleep: Good  Appetite:  Good  Current Medications:  Current Facility-Administered Medications  Medication Dose Route Frequency Provider Last Rate Last Admin   acetaminophen  (TYLENOL ) tablet 650 mg  650 mg Oral Q6H PRN Trudy Carwin, NP        alum & mag hydroxide-simeth (MAALOX/MYLANTA) 200-200-20 MG/5ML suspension 30 mL  30 mL Oral Q4H PRN Trudy Carwin, NP       aspirin  chewable tablet 81 mg  81 mg Oral Daily Zouev, Dmitri, MD   81 mg at 09/06/23 1032   atorvastatin  (LIPITOR) tablet 10 mg  10 mg Oral Daily Zouev, Dmitri, MD   10 mg at 09/06/23 1033   dicyclomine  (BENTYL ) tablet 20 mg  20 mg Oral Q6H PRN Trudy Carwin, NP       haloperidol  (HALDOL ) tablet 5 mg  5 mg Oral TID PRN Trudy Carwin, NP       And   diphenhydrAMINE  (BENADRYL ) capsule 50 mg  50 mg Oral TID PRN Trudy Carwin, NP       escitalopram  (LEXAPRO ) tablet 10 mg  10 mg Oral Daily Zouev, Dmitri, MD   10 mg at 09/06/23 1032   hydrOXYzine  (ATARAX ) tablet 25 mg  25 mg Oral Q6H PRN Trudy Carwin, NP   25 mg at 09/02/23 0207   loperamide  (IMODIUM ) capsule 2-4 mg  2-4 mg Oral PRN Trudy Carwin, NP       losartan  (COZAAR ) tablet 25 mg  25 mg Oral Daily Zouev, Dmitri, MD   25 mg at 09/06/23 1032   magnesium  hydroxide (MILK OF MAGNESIA) suspension 30 mL  30 mL Oral Daily PRN Trudy Carwin, NP       melatonin tablet 5 mg  5 mg Oral QHS Estill Llerena, Corean Massa, MD   5 mg at 09/05/23 2210   methocarbamol  (ROBAXIN ) tablet 500 mg  500 mg Oral Q8H PRN Trudy Carwin, NP       naltrexone  (DEPADE) tablet 50 mg  50 mg Oral Daily Zouev, Dmitri, MD  50 mg at 09/06/23 1032   naproxen  (NAPROSYN ) tablet 500 mg  500 mg Oral BID PRN Trudy Carwin, NP       nicotine  (NICODERM CQ  - dosed in mg/24 hours) patch 14 mg  14 mg Transdermal Daily Zouev, Dmitri, MD   14 mg at 09/06/23 1037   OLANZapine  (ZYPREXA ) injection 10 mg  10 mg Intramuscular TID PRN Trudy Carwin, NP       OLANZapine  (ZYPREXA ) injection 5 mg  5 mg Intramuscular TID PRN Trudy Carwin, NP       ondansetron  (ZOFRAN -ODT) disintegrating tablet 4 mg  4 mg Oral Q6H PRN Trudy Carwin, NP       pantoprazole  (PROTONIX ) EC tablet 40 mg  40 mg Oral Daily Zouev, Dmitri, MD   40 mg at 09/06/23 1032   risperiDONE  (RISPERDAL ) tablet 1 mg  1  mg Oral BID Zouev, Dmitri, MD   1 mg at 09/06/23 1032   traZODone  (DESYREL ) tablet 50 mg  50 mg Oral QHS PRN Zouev, Dmitri, MD       umeclidinium-vilanterol (ANORO ELLIPTA ) 62.5-25 MCG/ACT 1 puff  1 puff Inhalation Daily Zouev, Dmitri, MD   1 puff at 09/06/23 1033   Current Outpatient Medications  Medication Sig Dispense Refill   atorvastatin  (LIPITOR) 10 MG tablet Take 1 tablet (10 mg total) by mouth daily. (Patient not taking: Reported on 09/01/2023) 30 tablet 0   [facility] ASPIRIN  ADULT LOW DOSE 81 MG chewable tablet Chew 1 tablet (81 mg total) by mouth daily. 30 tablet 0   escitalopram  (LEXAPRO ) 10 MG tablet Take 1 tablet (10 mg total) by mouth daily. (Patient not taking: Reported on 09/01/2023) 30 tablet 0   hydrOXYzine  (ATARAX ) 25 MG tablet Take 1 tablet (25 mg total) by mouth 3 (three) times daily as needed for anxiety. (Patient not taking: Reported on 09/01/2023) 45 tablet 0   isosorbide  mononitrate (IMDUR ) 30 MG 24 hr tablet Take 1 tablet (30 mg total) by mouth daily. (Patient not taking: Reported on 09/01/2023) 30 tablet 0   losartan  (COZAAR ) 50 MG tablet Take 1 tablet (50 mg total) by mouth daily. (Patient not taking: Reported on 09/01/2023) 30 tablet 0   naltrexone  (DEPADE) 50 MG tablet Take 1 tablet (50 mg total) by mouth at bedtime. (Patient not taking: Reported on 09/01/2023) 30 tablet 0   nicotine  (NICODERM CQ  - DOSED IN MG/24 HOURS) 14 mg/24hr patch Place 1 patch (14 mg total) onto the skin daily. (Patient not taking: Reported on 09/01/2023) 28 patch 0   polyethylene glycol (MIRALAX  / GLYCOLAX ) 17 g packet Take 17 g by mouth daily. (Patient not taking: Reported on 09/01/2023) 30 each 0   risperiDONE  (RISPERDAL ) 3 MG tablet Take 1 tablet (3 mg total) by mouth at bedtime. (Patient not taking: Reported on 09/01/2023) 30 tablet 0   traZODone  (DESYREL ) 50 MG tablet Take 1 tablet (50 mg total) by mouth at bedtime as needed for sleep. (Patient not taking: Reported on 09/01/2023) 15 tablet 0    umeclidinium-vilanterol (ANORO ELLIPTA ) 62.5-25 MCG/ACT AEPB Inhale 1 puff into the lungs daily. (Patient not taking: Reported on 09/01/2023) 30 each 0    Labs  Lab Results:  Admission on 09/01/2023, Discharged on 09/02/2023  Component Date Value Ref Range Status   Sodium 09/01/2023 140  135 - 145 mmol/L Final   Potassium 09/01/2023 4.2  3.5 - 5.1 mmol/L Final   Chloride 09/01/2023 108  98 - 111 mmol/L Final   CO2 09/01/2023 23  22 - 32 mmol/L  Final   Glucose, Bld 09/01/2023 101 (H)  70 - 99 mg/dL Final   Glucose reference range applies only to samples taken after fasting for at least 8 hours.   BUN 09/01/2023 11  6 - 20 mg/dL Final   Creatinine, Ser 09/01/2023 1.05  0.61 - 1.24 mg/dL Final   Calcium  09/01/2023 9.5  8.9 - 10.3 mg/dL Final   Total Protein 92/68/7974 7.8  6.5 - 8.1 g/dL Final   Albumin 92/68/7974 3.9  3.5 - 5.0 g/dL Final   AST 92/68/7974 27  15 - 41 U/L Final   ALT 09/01/2023 25  0 - 44 U/L Final   Alkaline Phosphatase 09/01/2023 106  38 - 126 U/L Final   Total Bilirubin 09/01/2023 1.0  0.0 - 1.2 mg/dL Final   GFR, Estimated 09/01/2023 >60  >60 mL/min Final   Comment: (NOTE) Calculated using the CKD-EPI Creatinine Equation (2021)    Anion gap 09/01/2023 9  5 - 15 Final   Performed at [facility], [address], [place], [place] [address]   Alcohol, Ethyl (B) 09/01/2023 <15  <15 mg/dL Final   Comment: (NOTE) For medical purposes only. Performed at [facility], [address], [place], [place] [address]    WBC 09/01/2023 9.8  4.0 - 10.5 K/uL Final   RBC 09/01/2023 4.79  4.22 - 5.81 MIL/uL Final   Hemoglobin 09/01/2023 15.3  13.0 - 17.0 g/dL Final   HCT 92/68/7974 45.2  39.0 - 52.0 % Final   MCV 09/01/2023 94.4  80.0 - 100.0 fL Final   MCH 09/01/2023 31.9  26.0 - 34.0 pg Final   MCHC 09/01/2023 33.8  30.0 - 36.0 g/dL Final   RDW 92/68/7974 13.1  11.5 - 15.5 % Final   Platelets 09/01/2023 324  150 - 400 K/uL Final   nRBC  09/01/2023 0.0  0.0 - 0.2 % Final   Performed at [facility], [address], [place], [place] [address]   Opiates 09/01/2023 NONE DETECTED  NONE DETECTED Final   Cocaine 09/01/2023 POSITIVE (A)  NONE DETECTED Final   Benzodiazepines 09/01/2023 NONE DETECTED  NONE DETECTED Final   Amphetamines 09/01/2023 NONE DETECTED  NONE DETECTED Final   Tetrahydrocannabinol 09/01/2023 NONE DETECTED  NONE DETECTED Final   Barbiturates 09/01/2023 NONE DETECTED  NONE DETECTED Final   Comment: (NOTE) DRUG SCREEN FOR MEDICAL PURPOSES ONLY.  IF CONFIRMATION IS NEEDED FOR ANY PURPOSE, NOTIFY LAB WITHIN 5 DAYS.  LOWEST DETECTABLE LIMITS FOR URINE DRUG SCREEN Drug Class                     Cutoff (ng/mL) Amphetamine and metabolites    1000 Barbiturate and metabolites    200 Benzodiazepine                 200 Opiates and metabolites        300 Cocaine and metabolites        300 THC                            50 Performed at [facility], [address], [place], [place] [address]   Admission on 03/03/2023, Discharged on 03/14/2023  Component Date Value Ref Range Status   Prolactin 03/08/2023 31.2 (H)  3.6 - 25.2 ng/mL Final   Comment: (NOTE) Performed At: [facility] [address] [employer], [place] [id number] Jennette Shorter MD Ey:[id number]  SARS Coronavirus 2 by RT PCR 03/12/2023 POSITIVE (A)  NEGATIVE Final   Comment: (NOTE) SARS-CoV-2 target nucleic acids are DETECTED.  The SARS-CoV-2 RNA is generally detectable in upper respiratory specimens during the acute phase of infection. Positive results are indicative of the presence of the identified virus, but do not rule out bacterial infection or co-infection with other pathogens not detected by the test. Clinical correlation with patient history and other diagnostic information is necessary to determine patient infection status. The expected result is Negative.  Fact Sheet for  Patients: BloggerCourse.com  Fact Sheet for Healthcare Providers: SeriousBroker.it  This test is not yet approved or cleared by the [place]  FDA and  has been authorized for detection and/or diagnosis of SARS-CoV-2 by FDA under an Emergency Use Authorization (EUA).  This EUA will remain in effect (meaning this test can be used) for the duration of  the COVID-19 declaration under Section 564(b)(1) of the A                          ct, 21 U.S.C. section 360bbb-3(b)(1), unless the authorization is terminated or revoked sooner.     Influenza A by PCR 03/12/2023 NEGATIVE  NEGATIVE Final   Influenza B by PCR 03/12/2023 NEGATIVE  NEGATIVE Final   Comment: (NOTE) The Xpert Xpress SARS-CoV-2/FLU/RSV plus assay is intended as an aid in the diagnosis of influenza from Nasopharyngeal swab specimens and should not be used as a sole basis for treatment. Nasal washings and aspirates are unacceptable for Xpert Xpress SARS-CoV-2/FLU/RSV testing.  Fact Sheet for Patients: BloggerCourse.com  Fact Sheet for Healthcare Providers: SeriousBroker.it  This test is not yet approved or cleared by the [place]  FDA and has been authorized for detection and/or diagnosis of SARS-CoV-2 by FDA under an Emergency Use Authorization (EUA). This EUA will remain in effect (meaning this test can be used) for the duration of the COVID-19 declaration under Section 564(b)(1) of the Act, 21 U.S.C. section 360bbb-3(b)(1), unless the authorization is terminated or revoked.     Resp Syncytial Virus by PCR 03/12/2023 NEGATIVE  NEGATIVE Final   Comment: (NOTE) Fact Sheet for Patients: BloggerCourse.com  Fact Sheet for Healthcare Providers: SeriousBroker.it  This test is not yet approved or cleared by the [place]  FDA and has been authorized for detection  and/or diagnosis of SARS-CoV-2 by FDA under an Emergency Use Authorization (EUA). This EUA will remain in effect (meaning this test can be used) for the duration of the COVID-19 declaration under Section 564(b)(1) of the Act, 21 U.S.C. section 360bbb-3(b)(1), unless the authorization is terminated or revoked.  Performed at [facility], [address], [place], [place] [address]     Blood Alcohol level:  Lab Results  Component Value Date   [facility] <15 09/01/2023   ETH <10 03/03/2023    Metabolic Disorder Labs: Lab Results  Component Value Date   HGBA1C 5.7 (H) 03/03/2023   MPG 116.89 03/03/2023   Lab Results  Component Value Date   PROLACTIN 31.2 (H) 03/08/2023   Lab Results  Component Value Date   CHOL 159 03/03/2023   TRIG 65 03/03/2023   HDL 39 (L) 03/03/2023   CHOLHDL 4.1 03/03/2023   VLDL 13 03/03/2023   LDLCALC 107 (H) 03/03/2023   LDLCALC (H) 07/15/2006    102        Total Cholesterol/HDL:CHD Risk Coronary Heart Disease Risk Table  Men   Women  1/2 Average Risk   3.4   3.3    Therapeutic Lab Levels: No results found for: LITHIUM No results found for: VALPROATE No results found for: CBMZ  Physical Findings   AUDIT    Flowsheet Row [facility]  Alcohol Use Disorder Identification Test Final Score (AUDIT) 30   GAD-7    Flowsheet Row Office Visit from 08/13/2021 in [facility] [place]. [facility] Office Visit from 06/12/2021 in [facility]  Total GAD-7 Score 16 20   PHQ2-9    Flowsheet Row ED from 09/02/2023 in [facility] Office Visit from 02/10/2022 in [facility] [place]. [facility] Office Visit from 09/09/2021 in [facility] [place]. [facility] Office Visit from  08/27/2021 in [facility] - [facility] Dept Of [place]. [facility] Office Visit from 08/20/2021 in [facility] [place]. [facility]  PHQ-2 Total Score 3 0 3 1 6   PHQ-9 Total Score 14 -- 15 13 27    Flowsheet Row ED from 09/02/2023 in [facility] ED from 09[facility]01/2023 in [facility] Department [facility] Admission (Discharged) [facility]  C-SSRS RISK CATEGORY Low Risk High Risk High Risk     Musculoskeletal  Strength & Muscle Tone: within normal limits Gait & Station: normal Patient leans: N/A  Psychiatric Specialty Exam  Presentation  General Appearance:  Casual  Eye Contact: Good  Speech: Normal Rate  Speech Volume: Normal  Handedness: Right   Mood and Affect  Mood: Euthymic  Affect: Congruent   Thought Process  Thought Processes: Linear  Descriptions of Associations:Intact  Orientation:Full (Time, Place and Person)  Thought Content:Logical  Diagnosis of Schizophrenia or Schizoaffective disorder in past: No data recorded   Hallucinations:Hallucinations: None Description of Auditory Hallucinations: non-command  Ideas of Reference:None  Suicidal Thoughts:Suicidal Thoughts: No  Homicidal Thoughts:Homicidal Thoughts: No   Sensorium  Memory: Immediate Good; Recent Fair; Remote Fair  Judgment: Fair  Insight: Fair   Art therapist  Concentration: Fair  Attention Span: Fair  Recall: Fair  Fund of Knowledge: Good  Language: Good   Psychomotor Activity  Psychomotor Activity: Psychomotor Activity: Normal   Assets  Assets: Communication Skills; Desire for Improvement; Leisure Time   Sleep  Sleep: Sleep: Good  Estimated Sleeping Duration (Last 24 Hours): 9.75 hours  No data recorded  Physical Exam  Physical Exam ROS Blood pressure 137/85, pulse 93, temperature  98.4 F (36.9 C), temperature source Oral, resp. rate 20, SpO2 95%. There is no height or weight on file to calculate BMI.  Treatment Plan Summary: Daily contact with patient to assess and evaluate symptoms and progress in treatment, Medication management, and Plan as below   58 y/o male with a past psychiatric history of polysubstance abuse (cocaine, cannabis), with a bipolar 1 disorder.  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 presented to [facility] as a transfer from [facility] for substance abuse detox.   #Schizoaffective disorder depressed type -cont Risperdal   1 mg BID -cont Lexapro  10 mg qdaily   #cocaine abuse -inpatient rehab referral pending   #Alcohol abuse - CIWA finished. Restarted naltrexone  50 mg qdaily    Medical -Asa 81 mg daily for CAD, Atorvastatin  for HLD, inhaler restarted,    HTN- Losartan  to 25 qdaily for HTN    Corean Anette Potters, MD 09/06/2023 10:51 AM

## 2023-09-06 NOTE — ED Notes (Signed)
 Pt OOB in dayroom watching TV  calm and cooperative.  Pt stated he didn't need his Risperdal  anymore because he isn't hearing voices.  Medication education provided regarding indication for Risperdal  and the importance of continuing the medications even if he is feeling better.  Pt verbalized understanding, Denied SI/HI and A/V hallucinations Q 15 minute observations for safety continue

## 2023-09-06 NOTE — ED Notes (Signed)
 Pt observed lying in bed. Eyes closed respirations even and non labored. NAD q 15 minute observations continue for safety.

## 2023-09-06 NOTE — ED Notes (Signed)
 Pt observed in dayroom watching TV brighter affect noted.  Q 15 minute observations for safety continue
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
## 2023-09-06 NOTE — Discharge Planning (Signed)
 Received call back from [facility] at [facility] that they would not be able to accept this patient at this time due to his mental health needs.  SW spoke with provider and a respiratory consult was ordered. Lisa at [facility] is aware that this was ordered and is needed to verify if patient needs CPAP which is not typically allowed at residential treatment. Will update patient and continue to follow.

## 2023-09-06 NOTE — Group Note (Signed)
 Group Topic: Change and Accountability  Group Date: 09/06/2023 Start Time: 1030 End Time: 1130 Facilitators: Alyse Leilani LABOR, NT  Department: [facility]  Number of Participants: 5  Group Focus: chemical dependency education, chemical dependency issues, and clarity of thought Treatment Modality:  Leisure Development and Solution-Focused Therapy Interventions utilized were group exercise Purpose: regain self-worth and reinforce self-care  Name: Raymond Mcclure Date of Birth: [birth year]  MR: [id number]    Level of Participation: active Quality of Participation: attentive Interactions with others: gave feedback Mood/Affect: bright Triggers (if applicable): none Cognition: coherent/clear Progress: Gaining insight Response: none Plan: patient will be encouraged to keep attending groups even when you are discharged  Patients Problems:  Patient Active Problem List   Diagnosis Date Noted   Schizoaffective disorder, depressive type (HCC) 03/04/2023   Stimulant use disorder 03/04/2023   Alcohol use disorder, severe, dependence (HCC) 03/04/2023   Pulmonary nodules 02/12/2022   History of radiofrequency ablation procedure for cardiac arrhythmia 08/27/2021   Primary hypertension 08/27/2021   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

## 2023-09-06 NOTE — Group Note (Signed)
 Group Topic: Relapse and Recovery  Group Date: 09/06/2023 Start Time: 1941 End Time: 2040 Facilitators: Joan Plowman B  Department: [facility]  Number of Participants: 4  Group Focus: abuse issues, acceptance, and activities of daily living skills Treatment Modality:  Spiritual Interventions utilized were leisure development, patient education, story telling, and support Purpose: express feelings, relapse prevention strategies, and trigger / craving management  Name: Raymond Mcclure Date of Birth: 03/25/1965  MR: [id number]    Level of Participation: active Quality of Participation: attentive and cooperative Interactions with others: gave feedback Mood/Affect: appropriate Triggers (if applicable): NA Cognition: coherent/clear Progress: Gaining insight Response: NA Plan: patient will be encouraged to keep going to groups.   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

## 2023-09-06 NOTE — Group Note (Signed)
 Group Topic: Communication  Group Date: 09/06/2023 Start Time: 1000 End Time: 1030 Facilitators: Carletha Iha, RN  Department: [facility]  Number of Participants: 6  Group Focus: nursing group Treatment Modality:  Psychoeducation Interventions utilized were patient education Purpose: enhance coping skills  Name: Raymond Mcclure Date of Birth: 1965-06-16  MR: [id number]    Level of Participation: when cued Quality of Participation: cooperative Interactions with others:   minimal Mood/Affect: appropriate Triggers (if applicable):  Cognition: concrete Progress: Gaining insight Response:  Plan: patient will be encouraged to use practice coping skills after discharge  Patients Problems:  Patient Active Problem List   Diagnosis Date Noted   Schizoaffective disorder, depressive type (HCC) 03/04/2023   Stimulant use disorder 03/04/2023   Alcohol use disorder, severe, dependence (HCC) 03/04/2023   Pulmonary nodules 02/12/2022   History of radiofrequency ablation procedure for cardiac arrhythmia 08/27/2021   Primary hypertension 08/27/2021   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

## 2023-09-07 DIAGNOSIS — R079 Chest pain, unspecified: Secondary | ICD-10-CM | POA: Diagnosis not present

## 2023-09-07 MED ORDER — DICLOFENAC SODIUM 1 % EX GEL
4.0000 g | Freq: Two times a day (BID) | CUTANEOUS | Status: DC
  Administered 2023-09-07 – 2023-09-09 (×4): 4 g via TOPICAL
  Filled 2023-09-07 (×2): qty 100

## 2023-09-07 MED ORDER — MONTELUKAST SODIUM 10 MG PO TABS
10.0000 mg | ORAL_TABLET | Freq: Once | ORAL | Status: AC
  Administered 2023-09-07: 10 mg via ORAL
  Filled 2023-09-07: qty 1

## 2023-09-07 NOTE — ED Notes (Signed)
 Patient is awake and alert sitting in the day room eating breakfast.  No complaints at this time.  Will monitor.

## 2023-09-07 NOTE — ED Notes (Signed)
 Patient is sleeping. Respirations equal and unlabored. No change in assessment or acuity. Routine safety checks conducted according to facility protocol.

## 2023-09-07 NOTE — Care Management (Signed)
 [facility] Care Management   Declined - [facility], per Megan due to having a C-Pap machine.

## 2023-09-07 NOTE — ED Notes (Signed)
 Patient is sleeping. Respirations equal and unlabored, skin warm and dry. No change in assessment or acuity. Routine safety checks conducted according to facility protocol.

## 2023-09-07 NOTE — ED Provider Notes (Signed)
 Behavioral Health Progress Note  Date and Time: 09/07/2023 3:36 PM Name: Raymond Mcclure MRN:  [id number]  Subjective:  Raymond Mcclure was seen in the common area on rounds. He is feeling alright overall. Sleep has improved with the melatonin. He is having some neck and shoulder pain. He describes a feeling of fullness and sloshing sound in his left ear. It is not voices or music, appears to be fluid from allergies by description. No news on placement yet.   Diagnosis:  Final diagnoses:  Polysubstance abuse (HCC)  Suicidal ideation  Schizoaffective disorder, depressive type with good prognostic features (HCC)    Total Time spent with patient: 15 minutes  Past Psychiatric History: Prior diagnoses: Schizoaffective disorder, bipolar type, schiozaffective disorder, depressed type Prior  psychiatric medications: Patient appears to be taking Wellbutrin  and naltrexone , Risperdal  1 mg BID Psychiatric medication history/compliance: Abilify , naltrexone , Wellbutrin  Psychiatric hospitalization(s): Patient last hospitalized at [facility] in 03/2023 as noted aovePreviously hospitalized 04/2021 at [facility] for suicide attempt (attempting to jump off a parking deck) and substance use. History of suicide (obtained from HPI): 4 previous suicide attempts, last 2 years ago (OD on fentanyl ) requiring hospitalization History of homicide or aggression: Per public offender registry, patient was imprisoned from 1999-2005. Past Medical History: see chart  Family History:  Daughter has bipolar disorder.  Cousin has a history of schizophrenia. Suicide history: denied.    Social History: children live with mother in [place] , limited social support. 2 sisters. Lives in his mother's house with niece and received disability.  Sleep: Fair  Appetite:  Good  Current Medications:  Current Facility-Administered Medications  Medication Dose Route Frequency Provider Last Rate Last Admin   acetaminophen  (TYLENOL ) tablet 650 mg  650 mg  Oral Q6H PRN Trudy Carwin, NP   650 mg at 09/06/23 2126   alum & mag hydroxide-simeth (MAALOX/MYLANTA) 200-200-20 MG/5ML suspension 30 mL  30 mL Oral Q4H PRN Trudy Carwin, NP       aspirin  chewable tablet 81 mg  81 mg Oral Daily Zouev, Dmitri, MD   81 mg at 09/07/23 0933   atorvastatin  (LIPITOR) tablet 10 mg  10 mg Oral Daily Zouev, Dmitri, MD   10 mg at 09/07/23 0933   haloperidol  (HALDOL ) tablet 5 mg  5 mg Oral TID PRN Trudy Carwin, NP       And   diphenhydrAMINE  (BENADRYL ) capsule 50 mg  50 mg Oral TID PRN Trudy Carwin, NP       escitalopram  (LEXAPRO ) tablet 10 mg  10 mg Oral Daily Zouev, Dmitri, MD   10 mg at 09/07/23 9066   losartan  (COZAAR ) tablet 25 mg  25 mg Oral Daily Zouev, Dmitri, MD   25 mg at 09/07/23 0933   magnesium  hydroxide (MILK OF MAGNESIA) suspension 30 mL  30 mL Oral Daily PRN Trudy Carwin, NP       melatonin tablet 5 mg  5 mg Oral QHS Marylen Zuk, Corean Massa, MD   5 mg at 09/06/23 2126   naltrexone  (DEPADE) tablet 50 mg  50 mg Oral Daily Zouev, Dmitri, MD   50 mg at 09/07/23 0933   nicotine  (NICODERM CQ  - dosed in mg/24 hours) patch 14 mg  14 mg Transdermal Daily Zouev, Dmitri, MD   14 mg at 09/07/23 9066   OLANZapine  (ZYPREXA ) injection 10 mg  10 mg Intramuscular TID PRN Trudy Carwin, NP       OLANZapine  (ZYPREXA ) injection 5 mg  5 mg Intramuscular TID PRN Trudy Carwin, NP  pantoprazole  (PROTONIX ) EC tablet 40 mg  40 mg Oral Daily Zouev, Dmitri, MD   40 mg at 09/07/23 0933   risperiDONE  (RISPERDAL ) tablet 1 mg  1 mg Oral BID Zouev, Dmitri, MD   1 mg at 09/07/23 9066   traZODone  (DESYREL ) tablet 50 mg  50 mg Oral QHS PRN Zouev, Dmitri, MD       umeclidinium-vilanterol (ANORO ELLIPTA ) 62.5-25 MCG/ACT 1 puff  1 puff Inhalation Daily Zouev, Dmitri, MD   1 puff at 09/07/23 0934   Current Outpatient Medications  Medication Sig Dispense Refill   atorvastatin  (LIPITOR) 10 MG tablet Take 1 tablet (10 mg total) by mouth daily. (Patient not taking: Reported on 09/01/2023)  30 tablet 0   [facility] ASPIRIN  ADULT LOW DOSE 81 MG chewable tablet Chew 1 tablet (81 mg total) by mouth daily. 30 tablet 0   escitalopram  (LEXAPRO ) 10 MG tablet Take 1 tablet (10 mg total) by mouth daily. (Patient not taking: Reported on 09/01/2023) 30 tablet 0   hydrOXYzine  (ATARAX ) 25 MG tablet Take 1 tablet (25 mg total) by mouth 3 (three) times daily as needed for anxiety. (Patient not taking: Reported on 09/01/2023) 45 tablet 0   isosorbide  mononitrate (IMDUR ) 30 MG 24 hr tablet Take 1 tablet (30 mg total) by mouth daily. (Patient not taking: Reported on 09/01/2023) 30 tablet 0   losartan  (COZAAR ) 50 MG tablet Take 1 tablet (50 mg total) by mouth daily. (Patient not taking: Reported on 09/01/2023) 30 tablet 0   naltrexone  (DEPADE) 50 MG tablet Take 1 tablet (50 mg total) by mouth at bedtime. (Patient not taking: Reported on 09/01/2023) 30 tablet 0   nicotine  (NICODERM CQ  - DOSED IN MG/24 HOURS) 14 mg/24hr patch Place 1 patch (14 mg total) onto the skin daily. (Patient not taking: Reported on 09/01/2023) 28 patch 0   polyethylene glycol (MIRALAX  / GLYCOLAX ) 17 g packet Take 17 g by mouth daily. (Patient not taking: Reported on 09/01/2023) 30 each 0   risperiDONE  (RISPERDAL ) 3 MG tablet Take 1 tablet (3 mg total) by mouth at bedtime. (Patient not taking: Reported on 09/01/2023) 30 tablet 0   traZODone  (DESYREL ) 50 MG tablet Take 1 tablet (50 mg total) by mouth at bedtime as needed for sleep. (Patient not taking: Reported on 09/01/2023) 15 tablet 0   umeclidinium-vilanterol (ANORO ELLIPTA ) 62.5-25 MCG/ACT AEPB Inhale 1 puff into the lungs daily. (Patient not taking: Reported on 09/01/2023) 30 each 0    Labs  Lab Results:  Admission on 09/01/2023, Discharged on 09/02/2023  Component Date Value Ref Range Status   Sodium 09/01/2023 140  135 - 145 mmol/L Final   Potassium 09/01/2023 4.2  3.5 - 5.1 mmol/L Final   Chloride 09/01/2023 108  98 - 111 mmol/L Final   CO2 09/01/2023 23  22 - 32 mmol/L Final   Glucose,  Bld 09/01/2023 101 (H)  70 - 99 mg/dL Final   Glucose reference range applies only to samples taken after fasting for at least 8 hours.   BUN 09/01/2023 11  6 - 20 mg/dL Final   Creatinine, Ser 09/01/2023 1.05  0.61 - 1.24 mg/dL Final   Calcium  09/01/2023 9.5  8.9 - 10.3 mg/dL Final   Total Protein 92/68/7974 7.8  6.5 - 8.1 g/dL Final   Albumin 92/68/7974 3.9  3.5 - 5.0 g/dL Final   AST 92/68/7974 27  15 - 41 U/L Final   ALT 09/01/2023 25  0 - 44 U/L Final   Alkaline Phosphatase 09/01/2023 106  38 - 126 U/L Final   Total Bilirubin 09/01/2023 1.0  0.0 - 1.2 mg/dL Final   GFR, Estimated 09/01/2023 >60  >60 mL/min Final   Comment: (NOTE) Calculated using the CKD-EPI Creatinine Equation (2021)    Anion gap 09/01/2023 9  5 - 15 Final   Performed at [facility], [address] [place], [place] [address]   Alcohol, Ethyl (B) 09/01/2023 <15  <15 mg/dL Final   Comment: (NOTE) For medical purposes only. Performed at [facility], [address], [place], [place] [address]    WBC 09/01/2023 9.8  4.0 - 10.5 K/uL Final   RBC 09/01/2023 4.79  4.22 - 5.81 MIL/uL Final   Hemoglobin 09/01/2023 15.3  13.0 - 17.0 g/dL Final   HCT 92/68/7974 45.2  39.0 - 52.0 % Final   MCV 09/01/2023 94.4  80.0 - 100.0 fL Final   MCH 09/01/2023 31.9  26.0 - 34.0 pg Final   MCHC 09/01/2023 33.8  30.0 - 36.0 g/dL Final   RDW 92/68/7974 13.1  11.5 - 15.5 % Final   Platelets 09/01/2023 324  150 - 400 K/uL Final   nRBC 09/01/2023 0.0  0.0 - 0.2 % Final   Performed at [facility], [address] [place], [place] [address]   Opiates 09/01/2023 NONE DETECTED  NONE DETECTED Final   Cocaine 09/01/2023 POSITIVE (A)  NONE DETECTED Final   Benzodiazepines 09/01/2023 NONE DETECTED  NONE DETECTED Final   Amphetamines 09/01/2023 NONE DETECTED  NONE DETECTED Final   Tetrahydrocannabinol 09/01/2023 NONE DETECTED  NONE DETECTED Final   Barbiturates 09/01/2023 NONE DETECTED   NONE DETECTED Final   Comment: (NOTE) DRUG SCREEN FOR MEDICAL PURPOSES ONLY.  IF CONFIRMATION IS NEEDED FOR ANY PURPOSE, NOTIFY LAB WITHIN 5 DAYS.  LOWEST DETECTABLE LIMITS FOR URINE DRUG SCREEN Drug Class                     Cutoff (ng/mL) Amphetamine and metabolites    1000 Barbiturate and metabolites    200 Benzodiazepine                 200 Opiates and metabolites        300 Cocaine and metabolites        300 THC                            50 Performed at [facility], [address], [place], [place] [address]   Admission on 03/03/2023, Discharged on 03/14/2023  Component Date Value Ref Range Status   Prolactin 03/08/2023 31.2 (H)  3.6 - 25.2 ng/mL Final   Comment: (NOTE) Performed At: [facility] [address] [place], [place] [id number] Jennette Shorter MD Ey:[id number]    SARS Coronavirus 2 by RT PCR 03/12/2023 POSITIVE (A)  NEGATIVE Final   Comment: (NOTE) SARS-CoV-2 target nucleic acids are DETECTED.  The SARS-CoV-2 RNA is generally detectable in upper respiratory specimens during the acute phase of infection. Positive results are indicative of the presence of the identified virus, but do not rule out bacterial infection or co-infection with other pathogens not detected by the test. Clinical correlation with patient history and other diagnostic information is necessary to determine patient infection status. The expected result is Negative.  Fact Sheet for Patients: BloggerCourse.com  Fact Sheet for Healthcare Providers: SeriousBroker.it  This test is not yet approved or cleared by the [place]  FDA and  has been authorized for detection and/or diagnosis of SARS-CoV-2 by FDA under an Emergency Use Authorization (EUA).  This EUA will remain in effect (meaning this test can be used) for the duration of  the COVID-19 declaration under Section 564(b)(1) of the A                           ct, 21 U.S.C. section 360bbb-3(b)(1), unless the authorization is terminated or revoked sooner.     Influenza A by PCR 03/12/2023 NEGATIVE  NEGATIVE Final   Influenza B by PCR 03/12/2023 NEGATIVE  NEGATIVE Final   Comment: (NOTE) The Xpert Xpress SARS-CoV-2/FLU/RSV plus assay is intended as an aid in the diagnosis of influenza from Nasopharyngeal swab specimens and should not be used as a sole basis for treatment. Nasal washings and aspirates are unacceptable for Xpert Xpress SARS-CoV-2/FLU/RSV testing.  Fact Sheet for Patients: BloggerCourse.com  Fact Sheet for Healthcare Providers: SeriousBroker.it  This test is not yet approved or cleared by the [place]  FDA and has been authorized for detection and/or diagnosis of SARS-CoV-2 by FDA under an Emergency Use Authorization (EUA). This EUA will remain in effect (meaning this test can be used) for the duration of the COVID-19 declaration under Section 564(b)(1) of the Act, 21 U.S.C. section 360bbb-3(b)(1), unless the authorization is terminated or revoked.     Resp Syncytial Virus by PCR 03/12/2023 NEGATIVE  NEGATIVE Final   Comment: (NOTE) Fact Sheet for Patients: BloggerCourse.com  Fact Sheet for Healthcare Providers: SeriousBroker.it  This test is not yet approved or cleared by the [place]  FDA and has been authorized for detection and/or diagnosis of SARS-CoV-2 by FDA under an Emergency Use Authorization (EUA). This EUA will remain in effect (meaning this test can be used) for the duration of the COVID-19 declaration under Section 564(b)(1) of the Act, 21 U.S.C. section 360bbb-3(b)(1), unless the authorization is terminated or revoked.  Performed at [facility], [address], [place], [place] [address]     Blood Alcohol level:  Lab Results  Component Value Date   [facility] <15 09/01/2023    ETH <10 03/03/2023    Metabolic Disorder Labs: Lab Results  Component Value Date   HGBA1C 5.7 (H) 03/03/2023   MPG 116.89 03/03/2023   Lab Results  Component Value Date   PROLACTIN 31.2 (H) 03/08/2023   Lab Results  Component Value Date   CHOL 159 03/03/2023   TRIG 65 03/03/2023   HDL 39 (L) 03/03/2023   CHOLHDL 4.1 03/03/2023   VLDL 13 03/03/2023   LDLCALC 107 (H) 03/03/2023   LDLCALC (H) 07/15/2006    102        Total Cholesterol/HDL:CHD Risk Coronary Heart Disease Risk Table                     Men   Women  1/2 Average Risk   3.4   3.3    Therapeutic Lab Levels: No results found for: LITHIUM No results found for: VALPROATE No results found for: CBMZ  Physical Findings   AUDIT    Flowsheet Row [facility]  Alcohol Use Disorder Identification Test Final Score (AUDIT) 30   GAD-7    Flowsheet Row Office Visit from 08/13/2021 in [facility] Dept Of [place]. [facility] Office Visit from 06/12/2021 in [facility]  Total GAD-7 Score  16 20   PHQ2-9    Flowsheet Row ED from 09/02/2023 in [facility] Office Visit from 02/10/2022 in [facility] - A Dept Of [place]. [facility] Office Visit from 09/09/2021 in [facility] - A Dept Of [place]. [facility] Office Visit from 08/27/2021 in [facility] - A Dept Of [place]. [facility] Office Visit from 08/20/2021 in [facility] - A Dept Of . [facility]  PHQ-2 Total Score 3 0 3 1 6   PHQ-9 Total Score 14 -- 15 13 27    Flowsheet Row ED from 09/02/2023 in [facility] ED from 09/01/2023 in [facility] Emergency Department at [facility] Admission (Discharged) [facility]  C-SSRS RISK CATEGORY Low Risk High Risk High Risk     Musculoskeletal  Strength & Muscle Tone: within normal limits Gait & Station: normal Patient leans: N/A  Psychiatric Specialty Exam  Presentation  General Appearance:  Casual  Eye Contact: Good  Speech: Normal Rate  Speech Volume: Normal  Handedness: Right   Mood and Affect  Mood: Euthymic  Affect: Congruent   Thought Process  Thought Processes: Linear  Descriptions of Associations:Intact  Orientation:Full (Time, Place and Person)  Thought Content:Logical  Diagnosis of Schizophrenia or Schizoaffective disorder in past: No data recorded   Hallucinations:Hallucinations: None  Ideas of Reference:None  Suicidal Thoughts:Suicidal Thoughts: No  Homicidal Thoughts:Homicidal Thoughts: No   Sensorium  Memory: Immediate Good; Recent Fair; Remote Fair  Judgment: Fair  Insight: Fair   Art therapist  Concentration: Fair  Attention Span: Fair  Recall: Fair  Fund of Knowledge: Good  Language: Good   Psychomotor Activity  Psychomotor Activity: Psychomotor Activity: Normal   Assets  Assets: Communication Skills; Desire for Improvement; Leisure Time   Sleep  Sleep: Sleep: Good  Estimated Sleeping Duration (Last 24 Hours): 8.75-10.50 hours  No data recorded  Physical Exam  Physical Exam Vitals and nursing note reviewed.  Constitutional:      Appearance: Normal appearance.  HENT:     Head: Normocephalic and atraumatic.  Eyes:     Extraocular Movements: Extraocular movements intact.  Pulmonary:     Effort: Pulmonary effort is normal.  Musculoskeletal:        General: Normal range of motion.     Cervical back: Normal range of motion.  Neurological:     General: No focal deficit present.     Mental Status: He is alert and oriented to person, place, and time.  Psychiatric:        Behavior: Behavior normal.    Review of Systems  Constitutional:  Negative  for chills and fever.  HENT:  Positive for congestion.   Respiratory:  Negative for cough.   Cardiovascular:  Negative for chest pain.  Gastrointestinal:  Negative for constipation, diarrhea, nausea and vomiting.  Genitourinary:  Negative for dysuria.  Musculoskeletal:  Positive for joint pain and myalgias.  Psychiatric/Behavioral:  Negative for hallucinations and suicidal ideas.    Blood pressure 132/86, pulse 80, temperature 97.8 F (36.6 C), resp. rate 18, SpO2 100%. There is no height or weight on file to calculate BMI.  Treatment Plan Summary: Daily contact with patient to assess and evaluate symptoms and progress in treatment, Medication management, and Plan as below   58 y/o male with a past psychiatric history of polysubstance abuse (  cocaine, cannabis), with a bipolar 1 disorder.  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 presented to [facility] as a transfer from [facility] for substance abuse detox.    #Schizoaffective disorder depressed type -cont Risperdal  1 mg BID -cont Lexapro  10 mg qdaily   #cocaine abuse -inpatient rehab referral pending   #Alcohol abuse - CIWA finished. Restarted naltrexone  50 mg qdaily    Medical -Asa 81 mg daily for CAD, Atorvastatin  for HLD, inhaler restarted -consult placed for RT regarding CPAP use/need, but may not be available   HTN- Losartan  to 25 qdaily for HTN   Corean Anette Potters, MD 09/07/2023 3:36 PM

## 2023-09-07 NOTE — Group Note (Signed)
 Group Topic: Recovery Basics  Group Date: 09/07/2023 Start Time: 2000 End Time: 2030 Facilitators: Verdon Jacqualyn BRAVO, NT  Department: [facility]  Number of Participants: 5  Group Focus: substance abuse education Treatment Modality:  Patient-Centered Therapy Interventions utilized were group exercise Purpose: relapse prevention strategies  Name: Raymond Mcclure Date of Birth: 1965/11/03  MR: [id number]    Level of Participation: active Quality of Participation: cooperative Interactions with others: gave feedback Mood/Affect: appropriate Triggers (if applicable): n/a Cognition: coherent/clear Progress: Moderate Response: n/a Plan: follow-up needed  Patients Problems:  Patient Active Problem List   Diagnosis Date Noted   Schizoaffective disorder, depressive type (HCC) 03/04/2023   Stimulant use disorder 03/04/2023   Alcohol use disorder, severe, dependence (HCC) 03/04/2023   Pulmonary nodules 02/12/2022   History of radiofrequency ablation procedure for cardiac arrhythmia 08/27/2021   Primary hypertension 08/27/2021   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

## 2023-09-07 NOTE — Group Note (Signed)
 Group Topic: Recovery Basics  Group Date: 09/07/2023 Start Time: 1210 End Time: 1235 Facilitators: Stanly Stabile, RN  Department: [facility]  Number of Participants: 3  Group Focus: chemical dependency education, chemical dependency issues, clarity of thought, and coping skills Treatment Modality:  Behavior Modification Therapy Interventions utilized were clarification, exploration, group exercise, and patient education Purpose: express feelings, express irrational fears, improve communication skills, increase insight, regain self-worth, and reinforce self-care  Name: Raymond Mcclure Date of Birth: 29-Jul-1965  MR: [id number]    Level of Participation: moderate Quality of Participation: attentive and cooperative Interactions with others: gave feedback Mood/Affect: appropriate Triggers (if applicable):   Cognition: goal directed Progress: Gaining insight Response:   Plan: follow-up needed  Patients Problems:  Patient Active Problem List   Diagnosis Date Noted   Schizoaffective disorder, depressive type (HCC) 03/04/2023   Stimulant use disorder 03/04/2023   Alcohol use disorder, severe, dependence (HCC) 03/04/2023   Pulmonary nodules 02/12/2022   History of radiofrequency ablation procedure for cardiac arrhythmia 08/27/2021   Primary hypertension 08/27/2021   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

## 2023-09-07 NOTE — Group Note (Signed)
 Group Topic: Recovery Basics  Group Date: 09/07/2023 Start Time: 1415 End Time: 1515 Facilitators: Laneta Renea POUR, NT; Derycke, Monico RAMAN, NT  Department: [facility]  Number of Participants: 3  Group Focus: substance abuse education Treatment Modality:  Psychoeducation Interventions utilized were patient education Purpose: enhance coping skills, increase insight, and trigger / craving management  Name: Raymond Mcclure Date of Birth: 1965-07-27  MR: [id number]    Level of Participation: active Quality of Participation: attentive Interactions with others: gave feedback Mood/Affect: appropriate Triggers (if applicable): none Cognition: coherent/clear Progress: Gaining insight Response: AA meeting  Plan: patient will be encouraged to attend other groups  Patients Problems:  Patient Active Problem List   Diagnosis Date Noted   Schizoaffective disorder, depressive type (HCC) 03/04/2023   Stimulant use disorder 03/04/2023   Alcohol use disorder, severe, dependence (HCC) 03/04/2023   Pulmonary nodules 02/12/2022   History of radiofrequency ablation procedure for cardiac arrhythmia 08/27/2021   Primary hypertension 08/27/2021   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

## 2023-09-07 NOTE — ED Notes (Signed)
 Pt is in the dayroom watching TV with peers. Pt denies SI/HI/AVH. Pt reports, not wanting any sleeping pill because they make him have weird dreams. Pt has no further complain. No acute distress noted.

## 2023-09-07 NOTE — ED Notes (Signed)
 Patient is sleeping. Respirations equal and unlabored, skin warm and dry, NAD. No change in assessment or acuity. Routine safety checks conducted according to facility protocol.

## 2023-09-08 DIAGNOSIS — R079 Chest pain, unspecified: Secondary | ICD-10-CM | POA: Diagnosis not present

## 2023-09-08 NOTE — Group Note (Signed)
 Group Topic: Relapse and Recovery  Group Date: 09/08/2023 Start Time: 0830 End Time: 0930 Facilitators: Stanly Stabile, RN  Department: [facility]  Number of Participants: 5  Group Focus: chemical dependency education, chemical dependency issues, clarity of thought, and coping skills Treatment Modality:  Behavior Modification Therapy Interventions utilized were patient education Purpose: enhance coping skills, explore maladaptive thinking, express feelings, express irrational fears, improve communication skills, increase insight, regain self-worth, reinforce self-care, relapse prevention strategies, and trigger / craving management  Name: Raymond Mcclure Date of Birth: 01-30-1966  MR: [id number]    Level of Participation: moderate Quality of Participation: attentive and cooperative Interactions with others: gave feedback Mood/Affect: appropriate Triggers (if applicable):   Cognition: coherent/clear and goal directed Progress: Gaining insight Response:   Plan: follow-up needed  Patients Problems:  Patient Active Problem List   Diagnosis Date Noted   Schizoaffective disorder, depressive type (HCC) 03/04/2023   Stimulant use disorder 03/04/2023   Alcohol use disorder, severe, dependence (HCC) 03/04/2023   Pulmonary nodules 02/12/2022   History of radiofrequency ablation procedure for cardiac arrhythmia 08/27/2021   Primary hypertension 08/27/2021   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

## 2023-09-08 NOTE — Group Note (Signed)
 Group Topic: Relapse and Recovery  Group Date: 09/08/2023 Start Time: 8069 End Time: 2000 Facilitators: Verdon Jacqualyn BRAVO, NT  Department: [facility]  Number of Participants: 5  Group Focus: relapse prevention Treatment Modality:  Individual Therapy Interventions utilized were group exercise Purpose: relapse prevention strategies  Name: Raymond Mcclure Date of Birth: 1966-01-22  MR: [id number]    Level of Participation: active Quality of Participation: cooperative Interactions with others: gave feedback Mood/Affect: appropriate Triggers (if applicable): n/a Cognition: coherent/clear Progress: Moderate Response: goal- is to listen, and learn to be quiet, and ask my daughter for help if need it Plan: follow-up needed  Patients Problems:  Patient Active Problem List   Diagnosis Date Noted   Schizoaffective disorder, depressive type (HCC) 03/04/2023   Stimulant use disorder 03/04/2023   Alcohol use disorder, severe, dependence (HCC) 03/04/2023   Pulmonary nodules 02/12/2022   History of radiofrequency ablation procedure for cardiac arrhythmia 08/27/2021   Primary hypertension 08/27/2021   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

## 2023-09-08 NOTE — ED Provider Notes (Signed)
 Behavioral Health Progress Note  Date and Time: 09/08/2023 12:45 PM Name: CAILEN MIHALIK MRN:  [id number]  Subjective:  Larron was seen today in the common area. He did not sleep as well last night so is a bit tired today. He is eating okay. His shoulder pain is better with the voltaren . He still is hoping for residential rehab. No current hallucinations, thoughts of harm to self or others.   Diagnosis:  Final diagnoses:  Polysubstance abuse (HCC)  Suicidal ideation  Schizoaffective disorder, depressive type with good prognostic features (HCC)    Total Time spent with patient: 15 minutes  Past Psychiatric History: Prior diagnoses: Schizoaffective disorder, bipolar type, schiozaffective disorder, depressed type Prior  psychiatric medications: Patient appears to be taking Wellbutrin  and naltrexone , Risperdal  1 mg BID Psychiatric medication history/compliance: Abilify , naltrexone , Wellbutrin  Psychiatric hospitalization(s): Patient last hospitalized at [facility] in 03/2023 as noted aovePreviously hospitalized 04/2021 at [facility] for suicide attempt (attempting to jump off a parking deck) and substance use. History of suicide (obtained from HPI): 4 previous suicide attempts, last 2 years ago (OD on fentanyl ) requiring hospitalization History of homicide or aggression: Per public offender registry, patient was imprisoned from 1999-2005. Past Medical History: see chart  Family History:  Daughter has bipolar disorder.  Cousin has a history of schizophrenia. Suicide history: denied.    Social History: children live with mother in [place] , limited social support. 2 sisters. Lives in his mother's house with niece and received disability.  Sleep: Poor  Appetite:  Good  Current Medications:  Current Facility-Administered Medications  Medication Dose Route Frequency Provider Last Rate Last Admin   acetaminophen  (TYLENOL ) tablet 650 mg  650 mg Oral Q6H PRN Trudy Carwin, NP   650 mg at 09/06/23 2126    alum & mag hydroxide-simeth (MAALOX/MYLANTA) 200-200-20 MG/5ML suspension 30 mL  30 mL Oral Q4H PRN Trudy Carwin, NP       aspirin  chewable tablet 81 mg  81 mg Oral Daily Zouev, Dmitri, MD   81 mg at 09/08/23 0919   atorvastatin  (LIPITOR) tablet 10 mg  10 mg Oral Daily Zouev, Dmitri, MD   10 mg at 09/08/23 0919   diclofenac  Sodium (VOLTAREN ) 1 % topical gel 4 g  4 g Topical BID Leigh Corean Massa, MD   4 g at 09/08/23 0919   haloperidol  (HALDOL ) tablet 5 mg  5 mg Oral TID PRN Trudy Carwin, NP       And   diphenhydrAMINE  (BENADRYL ) capsule 50 mg  50 mg Oral TID PRN Trudy Carwin, NP       escitalopram  (LEXAPRO ) tablet 10 mg  10 mg Oral Daily Zouev, Dmitri, MD   10 mg at 09/08/23 9080   losartan  (COZAAR ) tablet 25 mg  25 mg Oral Daily Zouev, Dmitri, MD   25 mg at 09/08/23 9081   magnesium  hydroxide (MILK OF MAGNESIA) suspension 30 mL  30 mL Oral Daily PRN Trudy Carwin, NP       melatonin tablet 5 mg  5 mg Oral QHS Mkenzie Dotts, Corean Massa, MD   5 mg at 09/08/23 0010   naltrexone  (DEPADE) tablet 50 mg  50 mg Oral Daily Zouev, Dmitri, MD   50 mg at 09/08/23 0919   nicotine  (NICODERM CQ  - dosed in mg/24 hours) patch 14 mg  14 mg Transdermal Daily Zouev, Dmitri, MD   14 mg at 09/08/23 0919   OLANZapine  (ZYPREXA ) injection 10 mg  10 mg Intramuscular TID PRN Trudy Carwin, NP  OLANZapine  (ZYPREXA ) injection 5 mg  5 mg Intramuscular TID PRN Trudy Carwin, NP       pantoprazole  (PROTONIX ) EC tablet 40 mg  40 mg Oral Daily Zouev, Dmitri, MD   40 mg at 09/08/23 0919   risperiDONE  (RISPERDAL ) tablet 1 mg  1 mg Oral BID Zouev, Dmitri, MD   1 mg at 09/08/23 0919   traZODone  (DESYREL ) tablet 50 mg  50 mg Oral QHS PRN Zouev, Dmitri, MD       umeclidinium-vilanterol (ANORO ELLIPTA ) 62.5-25 MCG/ACT 1 puff  1 puff Inhalation Daily Zouev, Dmitri, MD   1 puff at 09/08/23 0919   Current Outpatient Medications  Medication Sig Dispense Refill   atorvastatin  (LIPITOR) 10 MG tablet Take 1 tablet (10 mg total) by  mouth daily. (Patient not taking: Reported on 09/01/2023) 30 tablet 0   [facility] ASPIRIN  ADULT LOW DOSE 81 MG chewable tablet Chew 1 tablet (81 mg total) by mouth daily. 30 tablet 0   escitalopram  (LEXAPRO ) 10 MG tablet Take 1 tablet (10 mg total) by mouth daily. (Patient not taking: Reported on 09/01/2023) 30 tablet 0   hydrOXYzine  (ATARAX ) 25 MG tablet Take 1 tablet (25 mg total) by mouth 3 (three) times daily as needed for anxiety. (Patient not taking: Reported on 09/01/2023) 45 tablet 0   isosorbide  mononitrate (IMDUR ) 30 MG 24 hr tablet Take 1 tablet (30 mg total) by mouth daily. (Patient not taking: Reported on 09/01/2023) 30 tablet 0   losartan  (COZAAR ) 50 MG tablet Take 1 tablet (50 mg total) by mouth daily. (Patient not taking: Reported on 09/01/2023) 30 tablet 0   naltrexone  (DEPADE) 50 MG tablet Take 1 tablet (50 mg total) by mouth at bedtime. (Patient not taking: Reported on 09/01/2023) 30 tablet 0   nicotine  (NICODERM CQ  - DOSED IN MG/24 HOURS) 14 mg/24hr patch Place 1 patch (14 mg total) onto the skin daily. (Patient not taking: Reported on 09/01/2023) 28 patch 0   polyethylene glycol (MIRALAX  / GLYCOLAX ) 17 g packet Take 17 g by mouth daily. (Patient not taking: Reported on 09/01/2023) 30 each 0   risperiDONE  (RISPERDAL ) 3 MG tablet Take 1 tablet (3 mg total) by mouth at bedtime. (Patient not taking: Reported on 09/01/2023) 30 tablet 0   traZODone  (DESYREL ) 50 MG tablet Take 1 tablet (50 mg total) by mouth at bedtime as needed for sleep. (Patient not taking: Reported on 09/01/2023) 15 tablet 0   umeclidinium-vilanterol (ANORO ELLIPTA ) 62.5-25 MCG/ACT AEPB Inhale 1 puff into the lungs daily. (Patient not taking: Reported on 09/01/2023) 30 each 0    Labs  Lab Results:  Admission on 09/01/2023, Discharged on 09/02/2023  Component Date Value Ref Range Status   Sodium 09/01/2023 140  135 - 145 mmol/L Final   Potassium 09/01/2023 4.2  3.5 - 5.1 mmol/L Final   Chloride 09/01/2023 108  98 - 111 mmol/L  Final   CO2 09/01/2023 23  22 - 32 mmol/L Final   Glucose, Bld 09/01/2023 101 (H)  70 - 99 mg/dL Final   Glucose reference range applies only to samples taken after fasting for at least 8 hours.   BUN 09/01/2023 11  6 - 20 mg/dL Final   Creatinine, Ser 09/01/2023 1.05  0.61 - 1.24 mg/dL Final   Calcium  09/01/2023 9.5  8.9 - 10.3 mg/dL Final   Total Protein 92/68/7974 7.8  6.5 - 8.1 g/dL Final   Albumin 92/68/7974 3.9  3.5 - 5.0 g/dL Final   AST 92/68/7974 27  15 -  41 U/L Final   ALT 09/01/2023 25  0 - 44 U/L Final   Alkaline Phosphatase 09/01/2023 106  38 - 126 U/L Final   Total Bilirubin 09/01/2023 1.0  0.0 - 1.2 mg/dL Final   GFR, Estimated 09/01/2023 >60  >60 mL/min Final   Comment: (NOTE) Calculated using the CKD-EPI Creatinine Equation (2021)    Anion gap 09/01/2023 9  5 - 15 Final   Performed at [facility], [address], [place], [place] [address]   Alcohol, Ethyl (B) 09/01/2023 <15  <15 mg/dL Final   Comment: (NOTE) For medical purposes only. Performed at [facility], [address], [place], [place] [address]    WBC 09/01/2023 9.8  4.0 - 10.5 K/uL Final   RBC 09/01/2023 4.79  4.22 - 5.81 MIL/uL Final   Hemoglobin 09/01/2023 15.3  13.0 - 17.0 g/dL Final   HCT 92/68/7974 45.2  39.0 - 52.0 % Final   MCV 09/01/2023 94.4  80.0 - 100.0 fL Final   MCH 09/01/2023 31.9  26.0 - 34.0 pg Final   MCHC 09/01/2023 33.8  30.0 - 36.0 g/dL Final   RDW 92/68/7974 13.1  11.5 - 15.5 % Final   Platelets 09/01/2023 324  150 - 400 K/uL Final   nRBC 09/01/2023 0.0  0.0 - 0.2 % Final   Performed at [facility], [address], [place], [place] [address]   Opiates 09/01/2023 NONE DETECTED  NONE DETECTED Final   Cocaine 09/01/2023 POSITIVE (A)  NONE DETECTED Final   Benzodiazepines 09/01/2023 NONE DETECTED  NONE DETECTED Final   Amphetamines 09/01/2023 NONE DETECTED  NONE DETECTED Final   Tetrahydrocannabinol 09/01/2023 NONE DETECTED   NONE DETECTED Final   Barbiturates 09/01/2023 NONE DETECTED  NONE DETECTED Final   Comment: (NOTE) DRUG SCREEN FOR MEDICAL PURPOSES ONLY.  IF CONFIRMATION IS NEEDED FOR ANY PURPOSE, NOTIFY LAB WITHIN 5 DAYS.  LOWEST DETECTABLE LIMITS FOR URINE DRUG SCREEN Drug Class                     Cutoff (ng/mL) Amphetamine and metabolites    1000 Barbiturate and metabolites    200 Benzodiazepine                 200 Opiates and metabolites        300 Cocaine and metabolites        300 THC                            50 Performed at [facility], [address] [place], [place] [address]   Admission on 03/03/2023, Discharged on 03/14/2023  Component Date Value Ref Range Status   Prolactin 03/08/2023 31.2 (H)  3.6 - 25.2 ng/mL Final   Comment: (NOTE) Performed At: [facility] [address] [place], [place] [id number] Jennette Shorter MD Ey:[id number]    SARS Coronavirus 2 by RT PCR 03/12/2023 POSITIVE (A)  NEGATIVE Final   Comment: (NOTE) SARS-CoV-2 target nucleic acids are DETECTED.  The SARS-CoV-2 RNA is generally detectable in upper respiratory specimens during the acute phase of infection. Positive results are indicative of the presence of the identified virus, but do not rule out bacterial infection or co-infection with other pathogens not detected by the test. Clinical correlation with patient history and other diagnostic information is necessary to determine patient infection status. The expected result is Negative.  Fact Sheet for Patients: BloggerCourse.com  Fact Sheet for Healthcare Providers: SeriousBroker.it  This test is not yet approved or cleared by the [place]  FDA and  has been authorized for detection and/or diagnosis of SARS-CoV-2 by FDA under an Emergency Use Authorization (EUA).  This EUA will remain in effect (meaning this test can be used) for the duration of  the COVID-19  declaration under Section 564(b)(1) of the A                          ct, 21 U.S.C. section 360bbb-3(b)(1), unless the authorization is terminated or revoked sooner.     Influenza A by PCR 03/12/2023 NEGATIVE  NEGATIVE Final   Influenza B by PCR 03/12/2023 NEGATIVE  NEGATIVE Final   Comment: (NOTE) The Xpert Xpress SARS-CoV-2/FLU/RSV plus assay is intended as an aid in the diagnosis of influenza from Nasopharyngeal swab specimens and should not be used as a sole basis for treatment. Nasal washings and aspirates are unacceptable for Xpert Xpress SARS-CoV-2/FLU/RSV testing.  Fact Sheet for Patients: BloggerCourse.com  Fact Sheet for Healthcare Providers: SeriousBroker.it  This test is not yet approved or cleared by the [place]  FDA and has been authorized for detection and/or diagnosis of SARS-CoV-2 by FDA under an Emergency Use Authorization (EUA). This EUA will remain in effect (meaning this test can be used) for the duration of the COVID-19 declaration under Section 564(b)(1) of the Act, 21 U.S.C. section 360bbb-3(b)(1), unless the authorization is terminated or revoked.     Resp Syncytial Virus by PCR 03/12/2023 NEGATIVE  NEGATIVE Final   Comment: (NOTE) Fact Sheet for Patients: BloggerCourse.com  Fact Sheet for Healthcare Providers: SeriousBroker.it  This test is not yet approved or cleared by the [place]  FDA and has been authorized for detection and/or diagnosis of SARS-CoV-2 by FDA under an Emergency Use Authorization (EUA). This EUA will remain in effect (meaning this test can be used) for the duration of the COVID-19 declaration under Section 564(b)(1) of the Act, 21 U.S.C. section 360bbb-3(b)(1), unless the authorization is terminated or revoked.  Performed at [facility], [address], [place], [place] [address]     Blood  Alcohol level:  Lab Results  Component Value Date   [facility] <15 09/01/2023   ETH <10 03/03/2023    Metabolic Disorder Labs: Lab Results  Component Value Date   HGBA1C 5.7 (H) 03/03/2023   MPG 116.89 03/03/2023   Lab Results  Component Value Date   PROLACTIN 31.2 (H) 03/08/2023   Lab Results  Component Value Date   CHOL 159 03/03/2023   TRIG 65 03/03/2023   HDL 39 (L) 03/03/2023   CHOLHDL 4.1 03/03/2023   VLDL 13 03/03/2023   LDLCALC 107 (H) 03/03/2023   LDLCALC (H) 07/15/2006    102        Total Cholesterol/HDL:CHD Risk Coronary Heart Disease Risk Table                     Men   Women  1/2 Average Risk   3.4   3.3    Therapeutic Lab Levels: No results found for: LITHIUM No results found for: VALPROATE No results found for: CBMZ  Physical Findings   AUDIT    Flowsheet Row [facility]  Alcohol Use Disorder Identification Test Final Score (AUDIT) 30   GAD-7    Flowsheet Row Office Visit from 08/13/2021 in [facility] -  A Dept Of [place]. [facility] Office Visit from 06/12/2021 in [facility]  Total GAD-7 Score 16 20   PHQ2-9    Flowsheet Row ED from 09/02/2023 in [facility] Office Visit from 02/10/2022 in [facility] - A Dept Of [place]. [facility] Office Visit from 09/09/2021 in [facility] - A Dept Of [place]. [facility] Office Visit from 08/27/2021 in [facility] - A Dept Of [place]. [facility] Office Visit from 08/20/2021 in [facility] Internal Med Ctr - A Dept Of [place]. [facility]  PHQ-2 Total Score 3 0 3 1 6   PHQ-9 Total Score 14 -- 15 13 27    Flowsheet Row ED from 09/02/2023 in [facility] ED from 09/01/2023 in [facility] at [facility] Admission  (Discharged) [facility]  C-SSRS RISK CATEGORY Low Risk High Risk High Risk     Musculoskeletal  Strength & Muscle Tone: within normal limits Gait & Station: normal Patient leans: N/A  Psychiatric Specialty Exam  Presentation  General Appearance:  Casual  Eye Contact: Good  Speech: Normal Rate  Speech Volume: Normal  Handedness: Right   Mood and Affect  Mood: Euthymic  Affect: Congruent   Thought Process  Thought Processes: Linear  Descriptions of Associations:Intact  Orientation:Full (Time, Place and Person)  Thought Content:Logical  Diagnosis of Schizophrenia or Schizoaffective disorder in past: No data recorded   Hallucinations:Hallucinations: None  Ideas of Reference:None  Suicidal Thoughts:Suicidal Thoughts: No  Homicidal Thoughts:Homicidal Thoughts: No   Sensorium  Memory: Immediate Good; Recent Fair; Remote Fair  Judgment: Fair  Insight: Fair   Art therapist  Concentration: Good  Attention Span: Good  Recall: Good  Fund of Knowledge: Good  Language: Good   Psychomotor Activity  Psychomotor Activity: Psychomotor Activity: Normal   Assets  Assets: Communication Skills; Desire for Improvement; Leisure Time; Housing   Sleep  Sleep: Sleep: Poor  Estimated Sleeping Duration (Last 24 Hours): 10.00-12.00 hours  No data recorded  Physical Exam  Physical Exam ROS Blood pressure 123/73, pulse 84, temperature 98 F (36.7 C), temperature source Oral, resp. rate 18, SpO2 95%. There is no height or weight on file to calculate BMI.  Treatment Plan Summary: Daily contact with patient to assess and evaluate symptoms and progress in treatment, Medication management, and Plan as below   58 y/o male with a past psychiatric history of polysubstance abuse (cocaine, cannabis), with a bipolar 1 disorder.  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 presented to [facility] as a transfer from [facility] for substance abuse detox.    #Schizoaffective disorder depressed type -cont Risperdal  1 mg BID -cont Lexapro  10 mg qdaily   #cocaine abuse -inpatient rehab referral pending   #Alcohol abuse - CIWA finished. Restarted naltrexone  50 mg qdaily    Medical -Asa 81 mg daily for CAD, Atorvastatin  for HLD, inhaler restarted -consult placed for RT regarding CPAP use/need, but may not be available   HTN- Losartan  to 25 qdaily for HTN   Corean Anette Potters, MD 09/08/2023 12:45 PM

## 2023-09-08 NOTE — Group Note (Signed)
 Group Topic: Trust and Honesty  Group Date: 09/08/2023 Start Time: 0800 End Time: 0830 Facilitators: Lonzell Dwayne RAMAN, NT  Department: [facility]  Number of Participants: 3  Group Focus: chemical dependency issues Treatment Modality:  Behavior Modification Therapy Interventions utilized were patient education Purpose: increase insight  Name: Raymond Mcclure Date of Birth: 1965/06/28  MR: [id number]    Level of Participation: Patient attended groupactive Quality of Participation: attentive Interactions with others: gave feedback Mood/Affect: positive Triggers (if applicable): Social Media Cognition: coherent/clear Progress: Moderate Response: Appropriate Plan: patient will be encouraged to work on his anger control and not letting others influence his progress.  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

## 2023-09-08 NOTE — ED Notes (Signed)
 Patient is in the bedroom calm and sleeping. NAD.

## 2023-09-08 NOTE — ED Notes (Signed)
 Pt is sleeping, no acute distress noticed.

## 2023-09-08 NOTE — Group Note (Signed)
 Group Topic: Understanding Self  Group Date: 09/08/2023 Start Time: 1215 End Time: 1300 Facilitators: Stanly Stabile, RN  Department: [facility]  Number of Participants: 5  Group Focus: acceptance, affirmation, chemical dependency education, clarity of thought, concentration, and coping skills Treatment Modality:  Behavior Modification Therapy Interventions utilized were clarification, confrontation, exploration, group exercise, leisure development, and patient education Purpose: enhance coping skills, explore maladaptive thinking, express feelings, express irrational fears, improve communication skills, increase insight, regain self-worth, and reinforce self-care  Name: Raymond Mcclure Date of Birth: 07-17-1965  MR: [id number]    Level of Participation: moderate Quality of Participation: attention seeking Interactions with others: gave feedback Mood/Affect: appropriate Triggers (if applicable):   Cognition: coherent/clear and goal directed Progress: Gaining insight Response:   Plan: follow-up needed  Patients Problems:  Patient Active Problem List   Diagnosis Date Noted   Schizoaffective disorder, depressive type (HCC) 03/04/2023   Stimulant use disorder 03/04/2023   Alcohol use disorder, severe, dependence (HCC) 03/04/2023   Pulmonary nodules 02/12/2022   History of radiofrequency ablation procedure for cardiac arrhythmia 08/27/2021   Primary hypertension 08/27/2021   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

## 2023-09-08 NOTE — ED Notes (Signed)
 Patient asleep in bed without issue or complaint.  Breathing even and unlabored.  Will monitor.

## 2023-09-08 NOTE — ED Notes (Signed)
 Patient has been calm and pleasant sitting in dayroom watching tv with peers.  He has attended groups today and has participated well.  Patient without somatic distress or complaint.  His organized and logical and appears motivated for recovery treatment.  Will monitor.

## 2023-09-08 NOTE — ED Notes (Signed)
 Patient is in the Dayroom calm and composed watching TV with other patients. NAD. Denies SI/HI/AVH. Environment secured per policy. Respirations even and unlabored. Will monitor for safety.

## 2023-09-09 DIAGNOSIS — F1910 Other psychoactive substance abuse, uncomplicated: Secondary | ICD-10-CM | POA: Diagnosis not present

## 2023-09-09 DIAGNOSIS — R079 Chest pain, unspecified: Secondary | ICD-10-CM | POA: Diagnosis not present

## 2023-09-09 DIAGNOSIS — R45851 Suicidal ideations: Secondary | ICD-10-CM | POA: Diagnosis not present

## 2023-09-09 DIAGNOSIS — F251 Schizoaffective disorder, depressive type: Secondary | ICD-10-CM | POA: Diagnosis not present

## 2023-09-09 NOTE — ED Notes (Signed)
 Patient asleep in the bedroom. NAD

## 2023-09-09 NOTE — ED Notes (Signed)
 Patient resting with eyes closed in no apparent acute distress. Respirations even and unlabored. Environment secured. Safety checks in place according to facility policy.

## 2023-09-09 NOTE — Group Note (Signed)
 Group Topic: Relapse and Recovery  Group Date: 09/09/2023 Start Time: 2000 End Time: 2100 Facilitators: Joan Plowman B  Department: [facility]  Number of Participants: 7  Group Focus: abuse issues, community group, and coping skills Treatment Modality:  Spiritual Interventions utilized were leisure development and patient education Purpose: express feelings  Name: Raymond Mcclure Date of Birth: 1966-01-10  MR: [id number]    Level of Participation: active Quality of Participation: attentive and cooperative Interactions with others: gave feedback Mood/Affect: appropriate Triggers (if applicable): NA Cognition: coherent/clear Progress: Gaining insight Response: NA Plan: patient will be encouraged to keep going to groups.  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

## 2023-09-09 NOTE — Group Note (Signed)
 Group Topic: Relapse and Recovery  Group Date: 09/09/2023 Start Time: 1715 End Time: 1730 Facilitators: Mitchell Lacinda PARAS, RN  Department: [facility]  Number of Participants: 5  Group Focus: relapse prevention Treatment Modality:  Psychoeducation Interventions utilized were patient education and support Purpose: enhance coping skills and relapse prevention strategies  Name: Raymond Mcclure Date of Birth: 31-Oct-1965  MR: [id number]    Level of Participation: active Quality of Participation: attentive and cooperative Interactions with others: gave feedback Mood/Affect: appropriate Triggers (if applicable):   Cognition: coherent/clear Progress: Moderate Response:   Plan: follow-up needed  Patients Problems:  Patient Active Problem List   Diagnosis Date Noted   Schizoaffective disorder, depressive type (HCC) 03/04/2023   Stimulant use disorder 03/04/2023   Alcohol use disorder, severe, dependence (HCC) 03/04/2023   Pulmonary nodules 02/12/2022   History of radiofrequency ablation procedure for cardiac arrhythmia 08/27/2021   Primary hypertension 08/27/2021   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

## 2023-09-09 NOTE — BHH Group Notes (Signed)
 SPIRITUALITY GROUP NOTE  Spirituality group facilitated by Chaplain Lourdez Mcgahan, MDiv, BCC.  Group Description:  Group focused on topic of hope.  Patients participated in facilitated discussion around topic, connecting with one another around experiences and definitions for hope.  Group members engaged with visual explorer photos, reflecting on what hope looks like for them today.  Group engaged in discussion around how their definitions of hope are present today in hospital.   Modalities: Psycho-social ed, Adlerian, Narrative, MI Patient Progress: Raymond Mcclure was present throughout group.  Alert and engaged with facilitator and peers.  Responses were appropriate topic.

## 2023-09-09 NOTE — ED Notes (Signed)

## 2023-09-09 NOTE — ED Provider Notes (Signed)
 Behavioral Health Progress Note  Date and Time: 09/09/2023 12:28 PM Name: Raymond Mcclure MRN:  [id number]  Subjective:  Raymond Mcclure was seen in his room on rounds. He is sleeping and eating okay. He is able to explain some further background about his recent relocation from [place] to [place]. He expressed preference to move forward with care in the [place] area, and will need his follow up here as well. He has some grandchildren here and he had been back and forth for a while. No new physical complaints or medication side effects. Denies current hallucinations, thoughts of harm to self or others.   Diagnosis:  Final diagnoses:  Polysubstance abuse (HCC)  Suicidal ideation  Schizoaffective disorder, depressive type with good prognostic features (HCC)    Total Time spent with patient: 15 minutes  Past Psychiatric History: Prior diagnoses: Schizoaffective disorder, bipolar type, schiozaffective disorder, depressed type Prior  psychiatric medications: Patient appears to be taking Wellbutrin  and naltrexone , Risperdal  1 mg BID Psychiatric medication history/compliance: Abilify , naltrexone , Wellbutrin  Psychiatric hospitalization(s): Patient last hospitalized at [facility] in 03/2023 as noted aovePreviously hospitalized 04/2021 at [facility] for suicide attempt (attempting to jump off a parking deck) and substance use. History of suicide (obtained from HPI): 4 previous suicide attempts, last 2 years ago (OD on fentanyl ) requiring hospitalization History of homicide or aggression: Per public offender registry, patient was imprisoned from 1999-2005. Past Medical History: see chart  Family History:  Daughter has bipolar disorder.  Cousin has a history of schizophrenia. Suicide history: denied.    Social History: children live with mother in [place] , limited social support. 2 sisters. Lives in his mother's house with niece and received disability.  Sleep: Good  Appetite:  Good  Current Medications:   Current Facility-Administered Medications  Medication Dose Route Frequency Provider Last Rate Last Admin   acetaminophen  (TYLENOL ) tablet 650 mg  650 mg Oral Q6H PRN Trudy Carwin, NP   650 mg at 09/06/23 2126   alum & mag hydroxide-simeth (MAALOX/MYLANTA) 200-200-20 MG/5ML suspension 30 mL  30 mL Oral Q4H PRN Trudy Carwin, NP       aspirin  chewable tablet 81 mg  81 mg Oral Daily Zouev, Dmitri, MD   81 mg at 09/09/23 9095   atorvastatin  (LIPITOR) tablet 10 mg  10 mg Oral Daily Zouev, Dmitri, MD   10 mg at 09/09/23 0904   diclofenac  Sodium (VOLTAREN ) 1 % topical gel 4 g  4 g Topical BID Leigh Corean Massa, MD   4 g at 09/08/23 2115   haloperidol  (HALDOL ) tablet 5 mg  5 mg Oral TID PRN Trudy Carwin, NP       And   diphenhydrAMINE  (BENADRYL ) capsule 50 mg  50 mg Oral TID PRN Trudy Carwin, NP       escitalopram  (LEXAPRO ) tablet 10 mg  10 mg Oral Daily Zouev, Dmitri, MD   10 mg at 09/09/23 9095   losartan  (COZAAR ) tablet 25 mg  25 mg Oral Daily Zouev, Dmitri, MD   25 mg at 09/09/23 9095   magnesium  hydroxide (MILK OF MAGNESIA) suspension 30 mL  30 mL Oral Daily PRN Trudy Carwin, NP       melatonin tablet 5 mg  5 mg Oral QHS Juletta Berhe, Corean Massa, MD   5 mg at 09/08/23 2108   naltrexone  (DEPADE) tablet 50 mg  50 mg Oral Daily Zouev, Dmitri, MD   50 mg at 09/09/23 9095   nicotine  (NICODERM CQ  - dosed in mg/24 hours) patch 14 mg  14 mg  Transdermal Daily Zouev, Dmitri, MD   14 mg at 09/09/23 9094   OLANZapine  (ZYPREXA ) injection 10 mg  10 mg Intramuscular TID PRN Trudy Carwin, NP       OLANZapine  (ZYPREXA ) injection 5 mg  5 mg Intramuscular TID PRN Trudy Carwin, NP       pantoprazole  (PROTONIX ) EC tablet 40 mg  40 mg Oral Daily Zouev, Dmitri, MD   40 mg at 09/09/23 9095   risperiDONE  (RISPERDAL ) tablet 1 mg  1 mg Oral BID Zouev, Dmitri, MD   1 mg at 09/09/23 9095   traZODone  (DESYREL ) tablet 50 mg  50 mg Oral QHS PRN Zouev, Dmitri, MD       umeclidinium-vilanterol (ANORO ELLIPTA ) 62.5-25  MCG/ACT 1 puff  1 puff Inhalation Daily Zouev, Dmitri, MD   1 puff at 09/09/23 0904   Current Outpatient Medications  Medication Sig Dispense Refill   atorvastatin  (LIPITOR) 10 MG tablet Take 1 tablet (10 mg total) by mouth daily. (Patient not taking: Reported on 09/01/2023) 30 tablet 0   [facility] ASPIRIN  ADULT LOW DOSE 81 MG chewable tablet Chew 1 tablet (81 mg total) by mouth daily. 30 tablet 0   escitalopram  (LEXAPRO ) 10 MG tablet Take 1 tablet (10 mg total) by mouth daily. (Patient not taking: Reported on 09/01/2023) 30 tablet 0   hydrOXYzine  (ATARAX ) 25 MG tablet Take 1 tablet (25 mg total) by mouth 3 (three) times daily as needed for anxiety. (Patient not taking: Reported on 09/01/2023) 45 tablet 0   isosorbide  mononitrate (IMDUR ) 30 MG 24 hr tablet Take 1 tablet (30 mg total) by mouth daily. (Patient not taking: Reported on 09/01/2023) 30 tablet 0   losartan  (COZAAR ) 50 MG tablet Take 1 tablet (50 mg total) by mouth daily. (Patient not taking: Reported on 09/01/2023) 30 tablet 0   naltrexone  (DEPADE) 50 MG tablet Take 1 tablet (50 mg total) by mouth at bedtime. (Patient not taking: Reported on 09/01/2023) 30 tablet 0   nicotine  (NICODERM CQ  - DOSED IN MG/24 HOURS) 14 mg/24hr patch Place 1 patch (14 mg total) onto the skin daily. (Patient not taking: Reported on 09/01/2023) 28 patch 0   polyethylene glycol (MIRALAX  / GLYCOLAX ) 17 g packet Take 17 g by mouth daily. (Patient not taking: Reported on 09/01/2023) 30 each 0   risperiDONE  (RISPERDAL ) 3 MG tablet Take 1 tablet (3 mg total) by mouth at bedtime. (Patient not taking: Reported on 09/01/2023) 30 tablet 0   traZODone  (DESYREL ) 50 MG tablet Take 1 tablet (50 mg total) by mouth at bedtime as needed for sleep. (Patient not taking: Reported on 09/01/2023) 15 tablet 0   umeclidinium-vilanterol (ANORO ELLIPTA ) 62.5-25 MCG/ACT AEPB Inhale 1 puff into the lungs daily. (Patient not taking: Reported on 09/01/2023) 30 each 0    Labs  Lab Results:  Admission on  09/01/2023, Discharged on 09/02/2023  Component Date Value Ref Range Status   Sodium 09/01/2023 140  135 - 145 mmol/L Final   Potassium 09/01/2023 4.2  3.5 - 5.1 mmol/L Final   Chloride 09/01/2023 108  98 - 111 mmol/L Final   CO2 09/01/2023 23  22 - 32 mmol/L Final   Glucose, Bld 09/01/2023 101 (H)  70 - 99 mg/dL Final   Glucose reference range applies only to samples taken after fasting for at least 8 hours.   BUN 09/01/2023 11  6 - 20 mg/dL Final   Creatinine, Ser 09/01/2023 1.05  0.61 - 1.24 mg/dL Final   Calcium  09/01/2023 9.5  8.9 -  10.3 mg/dL Final   Total Protein 92/68/7974 7.8  6.5 - 8.1 g/dL Final   Albumin 92/68/7974 3.9  3.5 - 5.0 g/dL Final   AST 92/68/7974 27  15 - 41 U/L Final   ALT 09/01/2023 25  0 - 44 U/L Final   Alkaline Phosphatase 09/01/2023 106  38 - 126 U/L Final   Total Bilirubin 09/01/2023 1.0  0.0 - 1.2 mg/dL Final   GFR, Estimated 09/01/2023 >60  >60 mL/min Final   Comment: (NOTE) Calculated using the CKD-EPI Creatinine Equation (2021)    Anion gap 09/01/2023 9  5 - 15 Final   Performed at [facility], [address] [place], [place] [address]   Alcohol, Ethyl (B) 09/01/2023 <15  <15 mg/dL Final   Comment: (NOTE) For medical purposes only. Performed at [facility], [address], [place], [place] [address]    WBC 09/01/2023 9.8  4.0 - 10.5 K/uL Final   RBC 09/01/2023 4.79  4.22 - 5.81 MIL/uL Final   Hemoglobin 09/01/2023 15.3  13.0 - 17.0 g/dL Final   HCT 92/68/7974 45.2  39.0 - 52.0 % Final   MCV 09/01/2023 94.4  80.0 - 100.0 fL Final   MCH 09/01/2023 31.9  26.0 - 34.0 pg Final   MCHC 09/01/2023 33.8  30.0 - 36.0 g/dL Final   RDW 92/68/7974 13.1  11.5 - 15.5 % Final   Platelets 09/01/2023 324  150 - 400 K/uL Final   nRBC 09/01/2023 0.0  0.0 - 0.2 % Final   Performed at [facility], [address], [place], [place] [address]   Opiates 09/01/2023 NONE DETECTED  NONE DETECTED Final   Cocaine  09/01/2023 POSITIVE (A)  NONE DETECTED Final   Benzodiazepines 09/01/2023 NONE DETECTED  NONE DETECTED Final   Amphetamines 09/01/2023 NONE DETECTED  NONE DETECTED Final   Tetrahydrocannabinol 09/01/2023 NONE DETECTED  NONE DETECTED Final   Barbiturates 09/01/2023 NONE DETECTED  NONE DETECTED Final   Comment: (NOTE) DRUG SCREEN FOR MEDICAL PURPOSES ONLY.  IF CONFIRMATION IS NEEDED FOR ANY PURPOSE, NOTIFY LAB WITHIN 5 DAYS.  LOWEST DETECTABLE LIMITS FOR URINE DRUG SCREEN Drug Class                     Cutoff (ng/mL) Amphetamine and metabolites    1000 Barbiturate and metabolites    200 Benzodiazepine                 200 Opiates and metabolites        300 Cocaine and metabolites        300 THC                            50 Performed at [facility], [address] [place], [place] [address]   Admission on 03/03/2023, Discharged on 03/14/2023  Component Date Value Ref Range Status   Prolactin 03/08/2023 31.2 (H)  3.6 - 25.2 ng/mL Final   Comment: (NOTE) Performed At: [facility] [address] [place], [place] [id number] Jennette Shorter MD Ey:[id number]    SARS Coronavirus 2 by RT PCR 03/12/2023 POSITIVE (A)  NEGATIVE Final   Comment: (NOTE) SARS-CoV-2 target nucleic acids are DETECTED.  The SARS-CoV-2 RNA is generally detectable in upper respiratory specimens during the acute phase of infection. Positive results are indicative of the presence of the identified virus, but do not rule out bacterial infection or co-infection with other  pathogens not detected by the test. Clinical correlation with patient history and other diagnostic information is necessary to determine patient infection status. The expected result is Negative.  Fact Sheet for Patients: BloggerCourse.com  Fact Sheet for Healthcare Providers: SeriousBroker.it  This test is not yet approved or cleared by the [place]  FDA and   has been authorized for detection and/or diagnosis of SARS-CoV-2 by FDA under an Emergency Use Authorization (EUA).  This EUA will remain in effect (meaning this test can be used) for the duration of  the COVID-19 declaration under Section 564(b)(1) of the A                          ct, 21 U.S.C. section 360bbb-3(b)(1), unless the authorization is terminated or revoked sooner.     Influenza A by PCR 03/12/2023 NEGATIVE  NEGATIVE Final   Influenza B by PCR 03/12/2023 NEGATIVE  NEGATIVE Final   Comment: (NOTE) The Xpert Xpress SARS-CoV-2/FLU/RSV plus assay is intended as an aid in the diagnosis of influenza from Nasopharyngeal swab specimens and should not be used as a sole basis for treatment. Nasal washings and aspirates are unacceptable for Xpert Xpress SARS-CoV-2/FLU/RSV testing.  Fact Sheet for Patients: BloggerCourse.com  Fact Sheet for Healthcare Providers: SeriousBroker.it  This test is not yet approved or cleared by the [place]  FDA and has been authorized for detection and/or diagnosis of SARS-CoV-2 by FDA under an Emergency Use Authorization (EUA). This EUA will remain in effect (meaning this test can be used) for the duration of the COVID-19 declaration under Section 564(b)(1) of the Act, 21 U.S.C. section 360bbb-3(b)(1), unless the authorization is terminated or revoked.     Resp Syncytial Virus by PCR 03/12/2023 NEGATIVE  NEGATIVE Final   Comment: (NOTE) Fact Sheet for Patients: BloggerCourse.com  Fact Sheet for Healthcare Providers: SeriousBroker.it  This test is not yet approved or cleared by the [place]  FDA and has been authorized for detection and/or diagnosis of SARS-CoV-2 by FDA under an Emergency Use Authorization (EUA). This EUA will remain in effect (meaning this test can be used) for the duration of the COVID-19 declaration under Section  564(b)(1) of the Act, 21 U.S.C. section 360bbb-3(b)(1), unless the authorization is terminated or revoked.  Performed at [facility], [address] [place], [place] [address]     Blood Alcohol level:  Lab Results  Component Value Date   [facility] <15 09/01/2023   ETH <10 03/03/2023    Metabolic Disorder Labs: Lab Results  Component Value Date   HGBA1C 5.7 (H) 03/03/2023   MPG 116.89 03/03/2023   Lab Results  Component Value Date   PROLACTIN 31.2 (H) 03/08/2023   Lab Results  Component Value Date   CHOL 159 03/03/2023   TRIG 65 03/03/2023   HDL 39 (L) 03/03/2023   CHOLHDL 4.1 03/03/2023   VLDL 13 03/03/2023   LDLCALC 107 (H) 03/03/2023   LDLCALC (H) 07/15/2006    102        Total Cholesterol/HDL:CHD Risk Coronary Heart Disease Risk Table                     Men   Women  1/2 Average Risk   3.4   3.3    Therapeutic Lab Levels: No results found for: LITHIUM No results found for: VALPROATE No results found for: CBMZ  Physical Findings   AUDIT    Flowsheet Row [facility]  Alcohol Use Disorder Identification Test Final Score (AUDIT) 30   GAD-7    Flowsheet Row Office Visit from 08/13/2021 in [facility] [place] Office Visit from 06/12/2021 in [facility]  Total GAD-7 Score 16 20   PHQ2-9    Flowsheet Row ED from 09/02/2023 in [facility] Office Visit from 02/10/2022 in [facility] - A Dept Of [place]. [facility] Office Visit from 09/09/2021 in [facility] - A Dept Of [place]. [facility] Office Visit from 08/27/2021 in [facility] - A Dept Of [place]. [facility] Office Visit from 08/20/2021 in [facility] - A Dept Of [place]. [facility]  PHQ-2 Total  Score 3 0 3 1 6   PHQ-9 Total Score 14 -- 15 13 27    Flowsheet Row ED from 09/02/2023 in [facility] ED from 09/01/2023 in [facility] Admission (Discharged) from [facility]  C-SSRS RISK CATEGORY Low Risk High Risk High Risk     Musculoskeletal  Strength & Muscle Tone: within normal limits Gait & Station: normal Patient leans: N/A  Psychiatric Specialty Exam  Presentation  General Appearance:  Casual  Eye Contact: Good  Speech: Normal Rate  Speech Volume: Normal  Handedness: Right   Mood and Affect  Mood: Euthymic  Affect: Congruent   Thought Process  Thought Processes: Linear  Descriptions of Associations:Intact  Orientation:Full (Time, Place and Person)  Thought Content:Logical  Diagnosis of Schizophrenia or Schizoaffective disorder in past: No data recorded   Hallucinations:Hallucinations: None  Ideas of Reference:None  Suicidal Thoughts:Suicidal Thoughts: No  Homicidal Thoughts:Homicidal Thoughts: No   Sensorium  Memory: Immediate Good; Recent Fair; Remote Fair  Judgment: Fair  Insight: Fair   Art therapist  Concentration: Fair  Attention Span: Fair  Recall: Fair  Fund of Knowledge: Fair  Language: Good   Psychomotor Activity  Psychomotor Activity: Psychomotor Activity: Normal   Assets  Assets: Leisure Time; Housing; Desire for Improvement; Communication Skills   Sleep  Sleep: Sleep: Good  Estimated Sleeping Duration (Last 24 Hours): 8.75-10.00 hours  No data recorded  Physical Exam  Physical Exam ROS Blood pressure 122/82, pulse 73, temperature 98.4 F (36.9 C), temperature source Oral, resp. rate 18, SpO2 97%. There is no height or weight on file to calculate BMI.  Treatment Plan Summary: Daily contact with patient to assess and evaluate symptoms and progress in treatment,  Medication management, and Plan as below   58 y/o male with a past psychiatric history of polysubstance abuse (cocaine, cannabis), with a bipolar 1 disorder.  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 presented to [facility] as a transfer from [facility] for substance abuse detox.    #Schizoaffective disorder depressed type -cont Risperdal  1 mg BID -cont Lexapro  10 mg qdaily   #cocaine abuse -will explore CDIOP with possible discharge on Monday   #Alcohol abuse - CIWA finished. Restarted naltrexone  50 mg qdaily    Medical -Asa 81 mg daily for CAD, Atorvastatin  for HLD, inhaler restarted -consult placed for RT regarding CPAP use/need, but  may not be available   HTN- Losartan  to 25 qdaily for HTN   Corean Anette Potters, MD 09/09/2023 12:28 PM

## 2023-09-09 NOTE — ED Notes (Signed)
 Pt was seen in the dayroom attending AA meeting. He denies SI/HI/AVH. He c/o neck and shoulder pain rated 7/10. Pt stated that scheduled Voltaren  helps relive this pain. He denies other physical symptoms. He is safe on the unit at this time with Q 15 min safety checks in place.

## 2023-09-09 NOTE — Group Note (Signed)
 Group Topic: Social Support  Group Date: 09/09/2023 Start Time: 1155 End Time: 1230 Facilitators: Joziah Dollins, Zane HERO, RN  Department: [facility]  Number of Participants: 1  Group Focus: check in and daily focus Treatment Modality:  Individual Therapy Interventions utilized were support Purpose: express feelings, increase insight, and reinforce self-care  Name: Raymond Mcclure Date of Birth: January 05, 1966  MR: [id number]    Level of Participation: minimal, when cued Quality of Participation: cooperative Interactions with others: gave feedback Mood/Affect: appropriate, bored, and flat Triggers (if applicable): None identified at this time Cognition: coherent/clear and logical Progress: Gaining insight Response: Patient minimally responds to writer during group, gets along well with peers in milieu. No complaints or concerns voiced at this time. Plan: patient will be encouraged to continue to attend group/programming on the unit  Patients Problems:  Patient Active Problem List   Diagnosis Date Noted   Schizoaffective disorder, depressive type (HCC) 03/04/2023   Stimulant use disorder 03/04/2023   Alcohol use disorder, severe, dependence (HCC) 03/04/2023   Pulmonary nodules 02/12/2022   History of radiofrequency ablation procedure for cardiac arrhythmia 08/27/2021   Primary hypertension 08/27/2021   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

## 2023-09-10 ENCOUNTER — Ambulatory Visit (INDEPENDENT_AMBULATORY_CARE_PROVIDER_SITE_OTHER)
Admission: EM | Admit: 2023-09-10 | Discharge: 2023-09-13 | Disposition: A | Discharge status: Home / Self Care | Source: Intra-hospital | Attending: Psychiatry | Admitting: Psychiatry

## 2023-09-10 ENCOUNTER — Emergency Department (HOSPITAL_COMMUNITY)

## 2023-09-10 ENCOUNTER — Other Ambulatory Visit: Payer: Self-pay

## 2023-09-10 ENCOUNTER — Emergency Department (HOSPITAL_COMMUNITY)
Admission: EM | Admit: 2023-09-10 | Discharge: 2023-09-10 | Disposition: A | Discharge status: Home / Self Care | Attending: Emergency Medicine | Admitting: Emergency Medicine

## 2023-09-10 DIAGNOSIS — F411 Generalized anxiety disorder: Secondary | ICD-10-CM | POA: Diagnosis present

## 2023-09-10 DIAGNOSIS — I2510 Atherosclerotic heart disease of native coronary artery without angina pectoris: Secondary | ICD-10-CM | POA: Insufficient documentation

## 2023-09-10 DIAGNOSIS — I10 Essential (primary) hypertension: Secondary | ICD-10-CM | POA: Insufficient documentation

## 2023-09-10 DIAGNOSIS — R0789 Other chest pain: Secondary | ICD-10-CM | POA: Diagnosis present

## 2023-09-10 DIAGNOSIS — F1910 Other psychoactive substance abuse, uncomplicated: Secondary | ICD-10-CM | POA: Insufficient documentation

## 2023-09-10 DIAGNOSIS — R0602 Shortness of breath: Secondary | ICD-10-CM | POA: Diagnosis not present

## 2023-09-10 DIAGNOSIS — Z79899 Other long term (current) drug therapy: Secondary | ICD-10-CM | POA: Insufficient documentation

## 2023-09-10 DIAGNOSIS — R079 Chest pain, unspecified: Secondary | ICD-10-CM | POA: Insufficient documentation

## 2023-09-10 DIAGNOSIS — Z7982 Long term (current) use of aspirin: Secondary | ICD-10-CM | POA: Insufficient documentation

## 2023-09-10 DIAGNOSIS — F1494 Cocaine use, unspecified with cocaine-induced mood disorder: Secondary | ICD-10-CM | POA: Diagnosis present

## 2023-09-10 DIAGNOSIS — Z91010 Allergy to peanuts: Secondary | ICD-10-CM | POA: Insufficient documentation

## 2023-09-10 DIAGNOSIS — F251 Schizoaffective disorder, depressive type: Secondary | ICD-10-CM | POA: Diagnosis present

## 2023-09-10 LAB — BASIC METABOLIC PANEL WITH GFR
Anion gap: 11 (ref 5–15)
BUN: 15 mg/dL (ref 6–20)
CO2: 22 mmol/L (ref 22–32)
Calcium: 8.9 mg/dL (ref 8.9–10.3)
Chloride: 103 mmol/L (ref 98–111)
Creatinine, Ser: 1.02 mg/dL (ref 0.61–1.24)
GFR, Estimated: >60 mL/min (ref >60)
Glucose, Bld: 190 mg/dL — ABNORMAL HIGH (ref 70–99)
Potassium: 4.4 mmol/L (ref 3.5–5.1)
Sodium: 136 mmol/L (ref 135–145)

## 2023-09-10 LAB — CBC
HCT: 41.5 % (ref 39.0–52.0)
Hemoglobin: 13.9 g/dL (ref 13.0–17.0)
MCH: 31.5 pg (ref 26.0–34.0)
MCHC: 33.5 g/dL (ref 30.0–36.0)
MCV: 94.1 fL (ref 80.0–100.0)
Platelets: 312 K/uL (ref 150–400)
RBC: 4.41 MIL/uL (ref 4.22–5.81)
RDW: 12.6 % (ref 11.5–15.5)
WBC: 11.9 K/uL — ABNORMAL HIGH (ref 4.0–10.5)
nRBC: 0 % (ref 0.0–0.2)

## 2023-09-10 LAB — TROPONIN I (HIGH SENSITIVITY)
Troponin I (High Sensitivity): 5 ng/L (ref <18)
Troponin I (High Sensitivity): 6 ng/L (ref <18)

## 2023-09-10 MED ORDER — DIPHENHYDRAMINE HCL 50 MG/ML IJ SOLN: 50.0000 mg | Freq: Three times a day (TID) | INTRAMUSCULAR | Status: DC | PRN

## 2023-09-10 MED ORDER — ALUM & MAG HYDROXIDE-SIMETH 200-200-20 MG/5ML PO SUSP: 30.0000 mL | ORAL | Status: DC | PRN

## 2023-09-10 MED ORDER — MIRTAZAPINE 7.5 MG PO TABS: 7.5000 mg | ORAL_TABLET | Freq: Every evening | ORAL | Status: DC | PRN

## 2023-09-10 MED ORDER — NITROGLYCERIN 0.4 MG SL SUBL: 0.4000 mg | SUBLINGUAL_TABLET | SUBLINGUAL | Status: DC | PRN

## 2023-09-10 MED ORDER — ATORVASTATIN CALCIUM 40 MG PO TABS
40.0000 mg | ORAL_TABLET | Freq: Every day | ORAL | Status: DC
  Administered 2023-09-10 – 2023-09-12 (×4): 40 mg via ORAL
  Filled 2023-09-10 (×3): qty 1

## 2023-09-10 MED ORDER — HALOPERIDOL 5 MG PO TABS: 5.0000 mg | ORAL_TABLET | Freq: Three times a day (TID) | ORAL | Status: DC | PRN

## 2023-09-10 MED ORDER — MELATONIN 5 MG PO TABS
5.0000 mg | ORAL_TABLET | Freq: Every day | ORAL | Status: DC
  Administered 2023-09-10 – 2023-09-12 (×4): 5 mg via ORAL
  Filled 2023-09-10 (×3): qty 1

## 2023-09-10 MED ORDER — NALTREXONE HCL 50 MG PO TABS
50.0000 mg | ORAL_TABLET | Freq: Every day | ORAL | Status: DC
  Administered 2023-09-10 – 2023-09-13 (×6): 50 mg via ORAL
  Filled 2023-09-10 (×4): qty 1

## 2023-09-10 MED ORDER — LORAZEPAM 2 MG/ML IJ SOLN: 2.0000 mg | Freq: Three times a day (TID) | INTRAMUSCULAR | Status: DC | PRN

## 2023-09-10 MED ORDER — HALOPERIDOL LACTATE 5 MG/ML IJ SOLN: 10.0000 mg | Freq: Three times a day (TID) | INTRAMUSCULAR | Status: DC | PRN

## 2023-09-10 MED ORDER — ESCITALOPRAM OXALATE 10 MG PO TABS
10.0000 mg | ORAL_TABLET | Freq: Every day | ORAL | Status: DC
  Administered 2023-09-10 – 2023-09-13 (×6): 10 mg via ORAL
  Filled 2023-09-10 (×4): qty 1

## 2023-09-10 MED ORDER — HYDROXYZINE HCL 25 MG PO TABS: 25.0000 mg | ORAL_TABLET | Freq: Three times a day (TID) | ORAL | Status: DC | PRN

## 2023-09-10 MED ORDER — RISPERIDONE 1 MG PO TABS
1.0000 mg | ORAL_TABLET | Freq: Two times a day (BID) | ORAL | Status: DC
  Administered 2023-09-10 – 2023-09-13 (×10): 1 mg via ORAL
  Filled 2023-09-10 (×7): qty 1

## 2023-09-10 MED ORDER — ACETAMINOPHEN 325 MG PO TABS: 650.0000 mg | ORAL_TABLET | Freq: Four times a day (QID) | ORAL | Status: DC | PRN

## 2023-09-10 MED ORDER — ALBUTEROL SULFATE HFA 108 (90 BASE) MCG/ACT IN AERS: 2.0000 | INHALATION_SPRAY | RESPIRATORY_TRACT | Status: DC | PRN

## 2023-09-10 MED ORDER — ASPIRIN 81 MG PO CHEW
324.0000 mg | CHEWABLE_TABLET | Freq: Once | ORAL | Status: AC
  Administered 2023-09-10: 324 mg via ORAL
  Filled 2023-09-10: qty 4

## 2023-09-10 MED ORDER — HALOPERIDOL LACTATE 5 MG/ML IJ SOLN: 5.0000 mg | Freq: Three times a day (TID) | INTRAMUSCULAR | Status: DC | PRN

## 2023-09-10 MED ORDER — MAGNESIUM HYDROXIDE 400 MG/5ML PO SUSP: 30.0000 mL | Freq: Every day | ORAL | Status: DC | PRN

## 2023-09-10 MED ORDER — PANTOPRAZOLE SODIUM 40 MG PO TBEC
40.0000 mg | DELAYED_RELEASE_TABLET | Freq: Every day | ORAL | Status: DC
  Administered 2023-09-10 – 2023-09-13 (×6): 40 mg via ORAL
  Filled 2023-09-10 (×4): qty 1

## 2023-09-10 MED ORDER — NITROGLYCERIN 0.4 MG SL SUBL
0.4000 mg | SUBLINGUAL_TABLET | SUBLINGUAL | Status: DC | PRN
  Administered 2023-09-10: 0.4 mg via SUBLINGUAL
  Filled 2023-09-10: qty 1

## 2023-09-10 MED ORDER — DIPHENHYDRAMINE HCL 50 MG PO CAPS: 50.0000 mg | ORAL_CAPSULE | Freq: Three times a day (TID) | ORAL | Status: DC | PRN

## 2023-09-10 MED ORDER — CLONIDINE HCL 0.1 MG PO TABS
0.1000 mg | ORAL_TABLET | Freq: Two times a day (BID) | ORAL | Status: DC
  Administered 2023-09-10 – 2023-09-13 (×10): 0.1 mg via ORAL
  Filled 2023-09-10 (×7): qty 1

## 2023-09-10 NOTE — ED Provider Notes (Signed)
 This NP was notified by Orene Carte, RN that pt is c/o new onset chest pain. This NP responded to bedside. On assessment, patient was found is in bed, reporting chest pain rated 5/10 and associated nausea. EKG obtained Nitroglycerin  0.4 mg SL administered Maalox given   Case reviewed with ED provider Dr. Theadore, MD. Given patient's history of cocaine abuse, atrial flutter status post ablation, and recent syncopal episode on 7/21 patient will be transfer to ED for further workup and medical clearance. He may return to [facility] post medical clearance.

## 2023-09-10 NOTE — ED Triage Notes (Signed)
 Pt woke up from sleep around midnight with an episode of substernal CP that was relieved by nitroglycerin . Pt went back to sleep and reports [facility] woke him up to come to the hospital for medical clearance. Pt pain free.

## 2023-09-10 NOTE — ED Notes (Signed)
 Patient is in the Dayroom watching TV with other patients. Denies needing anything NAD. Denies SI/H.  Respirations even and unlabored. Will monitor for safety.

## 2023-09-10 NOTE — ED Notes (Signed)
 Pt transferred with EMS to [facility] without incident.

## 2023-09-10 NOTE — ED Provider Notes (Signed)
 [place] EMERGENCY DEPARTMENT AT [facility] Provider Note   CSN: [id number] Arrival date & time: 09/10/23  0206     Patient presents with: Chest Pain   Raymond Mcclure is a 58 y.o. male.  With a history of polysubstance use, hypertension and CAD who presents to the ED for chest pain.  Patient is currently residing at behavioral health for help with his depression and polysubstance use awaiting a rehab bed.  Last night he began to report chest pain with associated shortness of breath that was relieved by nitroglycerin .  He went back to sleep after chest pain resolved and EMS had arrived shortly thereafter to transport him to the ED for medical clearance.  Here in the ED he reports that his chest pain has resolved.  No shortness of breath nausea vomiting fevers chills.  Last cocaine use was 9 days ago.  Denies HI SI.    Chest Pain      Prior to Admission medications   Medication Sig Start Date End Date Taking? Authorizing Provider  atorvastatin  (LIPITOR) 10 MG tablet Take 1 tablet (10 mg total) by mouth daily. Patient not taking: Reported on 09/01/2023 03/14/23   Izella Ismael NOVAK, MD  [facility] ASPIRIN  ADULT LOW DOSE 81 MG chewable tablet Chew 1 tablet (81 mg total) by mouth daily. 03/14/23   Izella Ismael NOVAK, MD  escitalopram  (LEXAPRO ) 10 MG tablet Take 1 tablet (10 mg total) by mouth daily. Patient not taking: Reported on 09/01/2023 03/15/23   Izella Ismael NOVAK, MD  hydrOXYzine  (ATARAX ) 25 MG tablet Take 1 tablet (25 mg total) by mouth 3 (three) times daily as needed for anxiety. Patient not taking: Reported on 09/01/2023 03/14/23   Izella Ismael NOVAK, MD  isosorbide  mononitrate (IMDUR ) 30 MG 24 hr tablet Take 1 tablet (30 mg total) by mouth daily. Patient not taking: Reported on 09/01/2023 03/14/23   Izella Ismael NOVAK, MD  losartan  (COZAAR ) 50 MG tablet Take 1 tablet (50 mg total) by mouth daily. Patient not taking: Reported on 09/01/2023 03/15/23   Izella Ismael NOVAK, MD  naltrexone  (DEPADE)  50 MG tablet Take 1 tablet (50 mg total) by mouth at bedtime. Patient not taking: Reported on 09/01/2023 03/14/23   Izella Ismael NOVAK, MD  nicotine  (NICODERM CQ  - DOSED IN MG/24 HOURS) 14 mg/24hr patch Place 1 patch (14 mg total) onto the skin daily. Patient not taking: Reported on 09/01/2023 03/15/23   Hoang, Daniela B, MD  polyethylene glycol (MIRALAX  / GLYCOLAX ) 17 g packet Take 17 g by mouth daily. Patient not taking: Reported on 09/01/2023 03/15/23   Izella Ismael NOVAK, MD  risperiDONE  (RISPERDAL ) 3 MG tablet Take 1 tablet (3 mg total) by mouth at bedtime. Patient not taking: Reported on 09/01/2023 03/14/23   Izella Ismael NOVAK, MD  traZODone  (DESYREL ) 50 MG tablet Take 1 tablet (50 mg total) by mouth at bedtime as needed for sleep. Patient not taking: Reported on 09/01/2023 03/14/23   Hoang, Daniela B, MD  umeclidinium-vilanterol (ANORO ELLIPTA ) 62.5-25 MCG/ACT AEPB Inhale 1 puff into the lungs daily. Patient not taking: Reported on 09/01/2023 03/15/23   Hoang, Daniela B, MD    Allergies: Bee venom, Peanut-containing drug products, and Covid-19 (mrna) vaccine    Review of Systems  Cardiovascular:  Positive for chest pain.    Updated Vital Signs BP 134/88 (BP Location: Right Arm)   Pulse 83   Temp 98.2 F (36.8 C)   Resp 16   Ht 5' 10 (1.778 m)  Wt 113 kg   SpO2 98%   BMI 35.74 kg/m   Physical Exam Vitals and nursing note reviewed.  HENT:     Head: Normocephalic and atraumatic.  Eyes:     Pupils: Pupils are equal, round, and reactive to light.  Cardiovascular:     Rate and Rhythm: Normal rate and regular rhythm.  Pulmonary:     Effort: Pulmonary effort is normal.     Breath sounds: Normal breath sounds.  Abdominal:     Palpations: Abdomen is soft.     Tenderness: There is no abdominal tenderness.  Musculoskeletal:     Right lower leg: No edema.     Left lower leg: No edema.  Skin:    General: Skin is warm and dry.  Neurological:     Mental Status: He is alert.  Psychiatric:         Mood and Affect: Mood normal.     (all labs ordered are listed, but only abnormal results are displayed) Labs Reviewed  BASIC METABOLIC PANEL WITH GFR - Abnormal; Notable for the following components:      Result Value   Glucose, Bld 190 (*)    All other components within normal limits  CBC - Abnormal; Notable for the following components:   WBC 11.9 (*)    All other components within normal limits  TROPONIN I (HIGH SENSITIVITY)  TROPONIN I (HIGH SENSITIVITY)    EKG: EKG Interpretation Date/Time:  Saturday September 10 2023 02:09:44 EDT Ventricular Rate:  93 PR Interval:  164 QRS Duration:  74 QT Interval:  354 QTC Calculation: 440 R Axis:   23  Text Interpretation: Normal sinus rhythm Septal infarct , age undetermined Abnormal ECG When compared with ECG of 01-Sep-2023 16:46, PREVIOUS ECG IS PRESENT Confirmed by Pamella Sharper [phone]) on 09/10/2023 7:07:46 AM  Radiology: [facility] Chest 2 View Result Date: 09/10/2023 CLINICAL DATA:  Chest pain EXAM: CHEST - 2 VIEW COMPARISON:  02/11/2023 FINDINGS: Lung volumes are small. Bibasilar linear atelectasis. No confluent pulmonary infiltrate. No pneumothorax or pleural effusion. Cardiac size within normal limits. Pulmonary vascularity is normal. No acute bone abnormality. IMPRESSION: 1. Pulmonary hypoinflation. Electronically Signed   By: Dorethia Molt M.D.   On: 09/10/2023 02:39     Procedures   Medications Ordered in the ED - No data to display                                  Medical Decision Making 58 year old male with history as above sent in from behavioral health due to concern for chest pain.  Chest pain has resolved here.  Sleeping comfortably.  Remains hemodynamically stable.  Low suspicion for ACS as initial troponin and delta troponin are flat.  Chest x-ray shows no acute disease.  EKG without evidence of dysrhythmia or new ischemic changes.  Laboratory workup otherwise unremarkable.  Patient is medically cleared and  appropriate to return to behavioral health for further counseling of his psychiatric and substance abuse issues  Amount and/or Complexity of Data Reviewed Labs: ordered. Radiology: ordered.        Final diagnoses:  Chest pain, unspecified type    ED Discharge Orders     None          Pamella Sharper LABOR, DO 09/10/23 [phone]

## 2023-09-10 NOTE — ED Notes (Signed)
 VS an hour after complaints of chest pain: BP:128/68, HR:97, RR:18, O2: 99%, Temp: 98.0. He is safe on the unit at this time with Q 15 min safety checks in place.

## 2023-09-10 NOTE — ED Notes (Signed)
 Pt c/o chest pain, left side numbness, nausea, and dizziness around 0000. Manual VS: BP:140/88, HR:96, RR:19, O2:100%, TEMP: 98.2. NP notified and EKG and nitroglycerin  ordered and completed. He started to say that his chest pain and left side numbness was going away at 0030 but nausea/indigestion and dizziness persisted. PRN Maalox given as per order. He is safe on the unit at this time with Q 15 min safety checks in place.

## 2023-09-10 NOTE — Group Note (Signed)
 Group Topic: Overcoming Obstacles  Group Date: 09/10/2023 Start Time: 1630 End Time: 1700 Facilitators: Myra Curtistine BROCKS, RN  Department: [facility]  Number of Participants: 3  Group Focus: daily focus, personal responsibility, and self-awareness Treatment Modality:  Psychoeducation Interventions utilized were other worksheets and patient education Purpose: increase insight  Name: Raymond Mcclure Date of Birth: 1965-09-09  MR: [id number]    Level of Participation:   Pt did not attend group. Quality of Participation:  Interactions with others:  Mood/Affect:  Triggers (if applicable):  Cognition:  Progress:  Response:  Plan: follow-up needed  Patients Problems:  Patient Active Problem List   Diagnosis Date Noted   Schizoaffective disorder, depressive type (HCC) 03/04/2023   Stimulant use disorder 03/04/2023   Alcohol use disorder, severe, dependence (HCC) 03/04/2023   Pulmonary nodules 02/12/2022   History of radiofrequency ablation procedure for cardiac arrhythmia 08/27/2021   Primary hypertension 08/27/2021   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

## 2023-09-10 NOTE — ED Notes (Signed)
 Patient is in the bedroom calm and sleeping. NAD Will monitor for safety.

## 2023-09-10 NOTE — ED Notes (Signed)
 Report called to Landry, RN at [facility]

## 2023-09-10 NOTE — ED Notes (Addendum)
 Bero MD in to see patient. Ok for the WR. Pt remains pain free. Pt adamantly not SI/HI.

## 2023-09-10 NOTE — Group Note (Signed)
 Group Topic: Recovery Basics  Group Date: 09/10/2023 Start Time: 2000 End Time: 2100 Facilitators: Luvenia Mae SAUNDERS, NT  Department: [facility]  Number of Participants: 6  Group Focus: substance abuse education Treatment Modality:  Leisure Development Interventions utilized were leisure development Purpose: increase insight and relapse prevention strategies  Name: Raymond Mcclure Date of Birth: June 01, 1965  MR: [id number]    Level of Participation: active Quality of Participation: attentive and cooperative Interactions with others: gave feedback Mood/Affect: appropriate Triggers (if applicable): None Cognition: coherent/clear and insightful Progress: Gaining insight Response: None Plan: patient will be encouraged to continue to go to group.  Patients Problems:  Patient Active Problem List   Diagnosis Date Noted   Schizoaffective disorder, depressive type (HCC) 03/04/2023   Stimulant use disorder 03/04/2023   Alcohol use disorder, severe, dependence (HCC) 03/04/2023   Pulmonary nodules 02/12/2022   History of radiofrequency ablation procedure for cardiac arrhythmia 08/27/2021   Primary hypertension 08/27/2021   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

## 2023-09-10 NOTE — ED Provider Notes (Signed)
 Behavioral Health Progress Note  Date and Time: 09/10/2023 11:56 AM Name: Raymond Mcclure MRN:  [id number]  Subjective:  Raymond Mcclure was seen in his room on rounds. He did not get much sleep overnight due to going to the ER and back. He is tired today. Otherwise no new physical complaints. He is not having the chest discomfort today. He asks for a change to sleeping medications. Denies hallucinations, thoughts of harm to self or others.   Diagnosis:  Final diagnoses:  Polysubstance abuse (HCC)    Total Time spent with patient: 15 minutes  Past Psychiatric History: Prior diagnoses: Schizoaffective disorder, bipolar type, schiozaffective disorder, depressed type Prior  psychiatric medications: Patient appears to be taking Wellbutrin  and naltrexone , Risperdal  1 mg BID Psychiatric medication history/compliance: Abilify , naltrexone , Wellbutrin  Psychiatric hospitalization(s): Patient last hospitalized at [facility] in 03/2023 as noted aovePreviously hospitalized 04/2021 at [facility] for suicide attempt (attempting to jump off a parking deck) and substance use. History of suicide (obtained from HPI): 4 previous suicide attempts, last 2 years ago (OD on fentanyl ) requiring hospitalization History of homicide or aggression: Per public offender registry, patient was imprisoned from 1999-2005. Past Medical History: see chart  Family History:  Daughter has bipolar disorder.  Cousin has a history of schizophrenia. Suicide history: denied.    Social History: children live with mother in [place] , limited social support. 2 sisters. Lives in his mother's house with niece and received disability.  Sleep: Poor  Appetite:  Good  Current Medications:  Current Facility-Administered Medications  Medication Dose Route Frequency Provider Last Rate Last Admin   acetaminophen  (TYLENOL ) tablet 650 mg  650 mg Oral Q6H PRN Armie Moren, Corean Massa, MD       albuterol  (VENTOLIN  HFA) 108 (90 Base) MCG/ACT inhaler 2 puff  2 puff  Inhalation Q4H PRN Avree Szczygiel, Corean Massa, MD       alum & mag hydroxide-simeth (MAALOX/MYLANTA) 200-200-20 MG/5ML suspension 30 mL  30 mL Oral Q4H PRN Ennis Delpozo, Corean Massa, MD       atorvastatin  (LIPITOR) tablet 40 mg  40 mg Oral QHS Nikaela Coyne, Corean Massa, MD       cloNIDine  (CATAPRES ) tablet 0.1 mg  0.1 mg Oral BID Leigh Corean Massa, MD   0.1 mg at 09/10/23 1015   haloperidol  (HALDOL ) tablet 5 mg  5 mg Oral TID PRN Leigh Corean Massa, MD       And   diphenhydrAMINE  (BENADRYL ) capsule 50 mg  50 mg Oral TID PRN Leigh Corean Massa, MD       haloperidol  lactate (HALDOL ) injection 5 mg  5 mg Intramuscular TID PRN Leigh Corean Massa, MD       And   diphenhydrAMINE  (BENADRYL ) injection 50 mg  50 mg Intramuscular TID PRN Leigh Corean Massa, MD       And   LORazepam  (ATIVAN ) injection 2 mg  2 mg Intramuscular TID PRN Leigh Corean Massa, MD       haloperidol  lactate (HALDOL ) injection 10 mg  10 mg Intramuscular TID PRN Leigh Corean Massa, MD       And   diphenhydrAMINE  (BENADRYL ) injection 50 mg  50 mg Intramuscular TID PRN Leigh Corean Massa, MD       And   LORazepam  (ATIVAN ) injection 2 mg  2 mg Intramuscular TID PRN Leigh Corean Massa, MD       escitalopram  (LEXAPRO ) tablet 10 mg  10 mg Oral Daily Anacaren Kohan, Corean Massa, MD   10 mg at 09/10/23 1015   hydrOXYzine  (ATARAX ) tablet  25 mg  25 mg Oral TID PRN Leigh Corean Massa, MD       magnesium  hydroxide (MILK OF MAGNESIA) suspension 30 mL  30 mL Oral Daily PRN Neita Landrigan, Corean Massa, MD       melatonin tablet 5 mg  5 mg Oral QHS Tyshana Nishida, Corean Massa, MD       mirtazapine  (REMERON ) tablet 7.5 mg  7.5 mg Oral QHS PRN Berkeley Vanaken, Corean Massa, MD       naltrexone  (DEPADE) tablet 50 mg  50 mg Oral Daily Cheyne Boulden, Corean Massa, MD   50 mg at 09/10/23 1015   nitroGLYCERIN  (NITROSTAT ) SL tablet 0.4 mg  0.4 mg Sublingual Q5 min PRN Leigh Corean Massa, MD       pantoprazole  (PROTONIX ) EC tablet 40 mg  40 mg Oral Daily Lyssa Hackley,  Corean Massa, MD   40 mg at 09/10/23 1015   risperiDONE  (RISPERDAL ) tablet 1 mg  1 mg Oral BID Leigh Corean Massa, MD   1 mg at 09/10/23 1015   No current outpatient medications on file.    Labs  Lab Results:  Admission on 09/10/2023, Discharged on 09/10/2023  Component Date Value Ref Range Status   Sodium 09/10/2023 136  135 - 145 mmol/L Final   Potassium 09/10/2023 4.4  3.5 - 5.1 mmol/L Final   HEMOLYSIS AT THIS LEVEL MAY AFFECT RESULT   Chloride 09/10/2023 103  98 - 111 mmol/L Final   CO2 09/10/2023 22  22 - 32 mmol/L Final   Glucose, Bld 09/10/2023 190 (H)  70 - 99 mg/dL Final   Glucose reference range applies only to samples taken after fasting for at least 8 hours.   BUN 09/10/2023 15  6 - 20 mg/dL Final   Creatinine, Ser 09/10/2023 1.02  0.61 - 1.24 mg/dL Final   Calcium  09/10/2023 8.9  8.9 - 10.3 mg/dL Final   GFR, Estimated 09/10/2023 >60  >60 mL/min Final   Comment: (NOTE) Calculated using the CKD-EPI Creatinine Equation (2021)    Anion gap 09/10/2023 11  5 - 15 Final   Performed at [facility], [address], [place], [place] [address]   WBC 09/10/2023 11.9 (H)  4.0 - 10.5 K/uL Final   RBC 09/10/2023 4.41  4.22 - 5.81 MIL/uL Final   Hemoglobin 09/10/2023 13.9  13.0 - 17.0 g/dL Final   HCT 91/90/7974 41.5  39.0 - 52.0 % Final   MCV 09/10/2023 94.1  80.0 - 100.0 fL Final   MCH 09/10/2023 31.5  26.0 - 34.0 pg Final   MCHC 09/10/2023 33.5  30.0 - 36.0 g/dL Final   RDW 91/90/7974 12.6  11.5 - 15.5 % Final   Platelets 09/10/2023 312  150 - 400 K/uL Final   nRBC 09/10/2023 0.0  0.0 - 0.2 % Final   Performed at [facility], [address], [place], [place] [address]   Troponin I (High Sensitivity) 09/10/2023 5  <18 ng/L Final   Comment: (NOTE) Elevated high sensitivity troponin I (hsTnI) values and significant  changes across serial measurements may suggest ACS but many other  chronic and acute conditions are known to elevate hsTnI results.  Refer to  the Links section for chest pain algorithms and additional  guidance. Performed at [facility], [address], [place], [place] [address]    Troponin I (High Sensitivity) 09/10/2023 6  <18 ng/L Final   Comment: (NOTE) Elevated high sensitivity troponin I (hsTnI) values and significant  changes across serial measurements  may suggest ACS but many other  chronic and acute conditions are known to elevate hsTnI results.  Refer to the Links section for chest pain algorithms and additional  guidance. Performed at [facility], [address] [place], [place] [address]   Admission on 09/01/2023, Discharged on 09/02/2023  Component Date Value Ref Range Status   Sodium 09/01/2023 140  135 - 145 mmol/L Final   Potassium 09/01/2023 4.2  3.5 - 5.1 mmol/L Final   Chloride 09/01/2023 108  98 - 111 mmol/L Final   CO2 09/01/2023 23  22 - 32 mmol/L Final   Glucose, Bld 09/01/2023 101 (H)  70 - 99 mg/dL Final   Glucose reference range applies only to samples taken after fasting for at least 8 hours.   BUN 09/01/2023 11  6 - 20 mg/dL Final   Creatinine, Ser 09/01/2023 1.05  0.61 - 1.24 mg/dL Final   Calcium  09/01/2023 9.5  8.9 - 10.3 mg/dL Final   Total Protein 92/68/7974 7.8  6.5 - 8.1 g/dL Final   Albumin 92/68/7974 3.9  3.5 - 5.0 g/dL Final   AST 92/68/7974 27  15 - 41 U/L Final   ALT 09/01/2023 25  0 - 44 U/L Final   Alkaline Phosphatase 09/01/2023 106  38 - 126 U/L Final   Total Bilirubin 09/01/2023 1.0  0.0 - 1.2 mg/dL Final   GFR, Estimated 09/01/2023 >60  >60 mL/min Final   Comment: (NOTE) Calculated using the CKD-EPI Creatinine Equation (2021)    Anion gap 09/01/2023 9  5 - 15 Final   Performed at [facility], [address] [place], [place] [address]   Alcohol, Ethyl (B) 09/01/2023 <15  <15 mg/dL Final   Comment: (NOTE) For medical purposes only. Performed at [facility], [address], [place], [place] [address]    WBC  09/01/2023 9.8  4.0 - 10.5 K/uL Final   RBC 09/01/2023 4.79  4.22 - 5.81 MIL/uL Final   Hemoglobin 09/01/2023 15.3  13.0 - 17.0 g/dL Final   HCT 92/68/7974 45.2  39.0 - 52.0 % Final   MCV 09/01/2023 94.4  80.0 - 100.0 fL Final   MCH 09/01/2023 31.9  26.0 - 34.0 pg Final   MCHC 09/01/2023 33.8  30.0 - 36.0 g/dL Final   RDW 92/68/7974 13.1  11.5 - 15.5 % Final   Platelets 09/01/2023 324  150 - 400 K/uL Final   nRBC 09/01/2023 0.0  0.0 - 0.2 % Final   Performed at [facility], [address] [place], [place] [address]   Opiates 09/01/2023 NONE DETECTED  NONE DETECTED Final   Cocaine 09/01/2023 POSITIVE (A)  NONE DETECTED Final   Benzodiazepines 09/01/2023 NONE DETECTED  NONE DETECTED Final   Amphetamines 09/01/2023 NONE DETECTED  NONE DETECTED Final   Tetrahydrocannabinol 09/01/2023 NONE DETECTED  NONE DETECTED Final   Barbiturates 09/01/2023 NONE DETECTED  NONE DETECTED Final   Comment: (NOTE) DRUG SCREEN FOR MEDICAL PURPOSES ONLY.  IF CONFIRMATION IS NEEDED FOR ANY PURPOSE, NOTIFY LAB WITHIN 5 DAYS.  LOWEST DETECTABLE LIMITS FOR URINE DRUG SCREEN Drug Class                     Cutoff (ng/mL) Amphetamine and metabolites    1000 Barbiturate and metabolites    200 Benzodiazepine                 200 Opiates and metabolites  300 Cocaine and metabolites        300 THC                            50 Performed at [facility], [address], [place], [place] [address]   Admission on 03/03/2023, Discharged on 03/14/2023  Component Date Value Ref Range Status   Prolactin 03/08/2023 31.2 (H)  3.6 - 25.2 ng/mL Final   Comment: (NOTE) Performed At: [facility] [address] [place], [place] [id number] Jennette Shorter MD Ey:[id number]    SARS Coronavirus 2 by RT PCR 03/12/2023 POSITIVE (A)  NEGATIVE Final   Comment: (NOTE) SARS-CoV-2 target nucleic acids are DETECTED.  The SARS-CoV-2 RNA is generally detectable in upper  respiratory specimens during the acute phase of infection. Positive results are indicative of the presence of the identified virus, but do not rule out bacterial infection or co-infection with other pathogens not detected by the test. Clinical correlation with patient history and other diagnostic information is necessary to determine patient infection status. The expected result is Negative.  Fact Sheet for Patients: BloggerCourse.com  Fact Sheet for Healthcare Providers: SeriousBroker.it  This test is not yet approved or cleared by the [place]  FDA and  has been authorized for detection and/or diagnosis of SARS-CoV-2 by FDA under an Emergency Use Authorization (EUA).  This EUA will remain in effect (meaning this test can be used) for the duration of  the COVID-19 declaration under Section 564(b)(1) of the A                          ct, 21 U.S.C. section 360bbb-3(b)(1), unless the authorization is terminated or revoked sooner.     Influenza A by PCR 03/12/2023 NEGATIVE  NEGATIVE Final   Influenza B by PCR 03/12/2023 NEGATIVE  NEGATIVE Final   Comment: (NOTE) The Xpert Xpress SARS-CoV-2/FLU/RSV plus assay is intended as an aid in the diagnosis of influenza from Nasopharyngeal swab specimens and should not be used as a sole basis for treatment. Nasal washings and aspirates are unacceptable for Xpert Xpress SARS-CoV-2/FLU/RSV testing.  Fact Sheet for Patients: BloggerCourse.com  Fact Sheet for Healthcare Providers: SeriousBroker.it  This test is not yet approved or cleared by the [place]  FDA and has been authorized for detection and/or diagnosis of SARS-CoV-2 by FDA under an Emergency Use Authorization (EUA). This EUA will remain in effect (meaning this test can be used) for the duration of the COVID-19 declaration under Section 564(b)(1) of the Act, 21 U.S.C. section  360bbb-3(b)(1), unless the authorization is terminated or revoked.     Resp Syncytial Virus by PCR 03/12/2023 NEGATIVE  NEGATIVE Final   Comment: (NOTE) Fact Sheet for Patients: BloggerCourse.com  Fact Sheet for Healthcare Providers: SeriousBroker.it  This test is not yet approved or cleared by the [place]  FDA and has been authorized for detection and/or diagnosis of SARS-CoV-2 by FDA under an Emergency Use Authorization (EUA). This EUA will remain in effect (meaning this test can be used) for the duration of the COVID-19 declaration under Section 564(b)(1) of the Act, 21 U.S.C. section 360bbb-3(b)(1), unless the authorization is terminated or revoked.  Performed at [facility], [address], [place], [place] [address]     Blood Alcohol level:  Lab Results  Component Value Date   [facility] <15 09/01/2023   ETH <10 03/03/2023    Metabolic Disorder Labs: Lab Results  Component Value Date   HGBA1C 5.7 (H) 03/03/2023   MPG 116.89 03/03/2023   Lab Results  Component Value Date   PROLACTIN 31.2 (H) 03/08/2023   Lab Results  Component Value Date   CHOL 159 03/03/2023   TRIG 65 03/03/2023   HDL 39 (L) 03/03/2023   CHOLHDL 4.1 03/03/2023   VLDL 13 03/03/2023   LDLCALC 107 (H) 03/03/2023   LDLCALC (H) 07/15/2006    102        Total Cholesterol/HDL:CHD Risk Coronary Heart Disease Risk Table                     Men   Women  1/2 Average Risk   3.4   3.3    Therapeutic Lab Levels: No results found for: LITHIUM No results found for: VALPROATE No results found for: CBMZ  Physical Findings   AUDIT    Flowsheet Row [facility]  Alcohol Use Disorder Identification Test Final Score (AUDIT) 30   GAD-7    Flowsheet Row Office Visit from 08/13/2021 in [facility] Of [place]. [facility] Office Visit from 06/12/2021 in [facility]  Total GAD-7 Score 16 20   PHQ2-9    Flowsheet Row ED from 09/02/2023 in [facility] Office Visit from 02/10/2022 in [facility] Dept Of [place]. [facility] Office Visit from 09/09/2021 in [facility] Dept Of [place]. [facility] Office Visit from 08/27/2021 in [facility] - [facility] Dept Of [place]. [facility] Office Visit from 08/20/2021 in [facility] - A Dept Of [place]. [facility]  PHQ-2 Total Score 3 0 3 1 6   PHQ-9 Total Score 14 -- 15 13 27    Flowsheet Row ED from 09/10/2023 in [facility] Most recent reading at 09/10/2023 11:53 AM ED from 09/10/2023 in [facility] Emergency Department at [facility] Most recent reading at 09/10/2023  2:13 AM ED from 09/02/2023 in [facility] Most recent reading at 09/02/2023  2:48 AM  C-SSRS RISK CATEGORY Error: Q3, 4, or 5 should not be populated when Q2 is No No Risk Low Risk     Musculoskeletal  Strength & Muscle Tone: within normal limits Gait & Station: normal Patient leans: N/A  Psychiatric Specialty Exam  Presentation  General Appearance:  Casual  Eye Contact: Good  Speech: Normal Rate  Speech Volume: Normal  Handedness: Right   Mood and Affect  Mood: Euthymic  Affect: Congruent   Thought Process  Thought Processes: Linear  Descriptions of Associations:Intact  Orientation:Full (Time, Place and Person)  Thought Content:Logical  Diagnosis of Schizophrenia or Schizoaffective disorder in past: No data recorded   Hallucinations:Hallucinations: None  Ideas of Reference:None  Suicidal Thoughts:Suicidal Thoughts: No  Homicidal Thoughts:Homicidal Thoughts: No   Sensorium  Memory: Immediate Good; Recent Fair; Remote  Fair  Judgment: Fair  Insight: Fair   Art therapist  Concentration: Fair  Attention Span: Fair  Recall: Fair  Fund of Knowledge: Fair  Language: Good   Psychomotor Activity  Psychomotor Activity: Psychomotor Activity: Normal   Assets  Assets: Leisure Time; Housing; Desire for Improvement; Communication Skills   Sleep  Sleep: Sleep: Good  Estimated Sleeping Duration (Last 24 Hours): 0.00 hours  No data recorded  Physical Exam  Physical Exam Vitals and nursing note reviewed.  Constitutional:      Appearance: Normal appearance.  HENT:     Head: Normocephalic.  Eyes:     Extraocular Movements: Extraocular movements intact.  Pulmonary:     Effort: Pulmonary effort is normal.  Musculoskeletal:        General: Normal range of motion.     Cervical back: Normal range of motion.  Neurological:     General: No focal deficit present.     Mental Status: He is alert and oriented to person, place, and time.  Psychiatric:        Behavior: Behavior normal.    Review of Systems  Constitutional:  Positive for malaise/fatigue. Negative for chills and fever.  Gastrointestinal:  Negative for constipation, diarrhea, nausea and vomiting.  Genitourinary:  Negative for dysuria.  Musculoskeletal:  Negative for joint pain and myalgias.  Neurological:  Negative for dizziness and headaches.  Psychiatric/Behavioral:  Negative for hallucinations and suicidal ideas.    Blood pressure (!) 159/92, pulse 83, temperature 98.3 F (36.8 C), temperature source Oral, resp. rate 18, SpO2 99%. There is no height or weight on file to calculate BMI.  Treatment Plan Summary: Treatment Plan Summary: Daily contact with patient to assess and evaluate symptoms and progress in treatment, Medication management, and Plan as below   58 y/o male with a past psychiatric history of polysubstance abuse (cocaine, cannabis), with a bipolar 1 disorder.  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 presented to [facility] as a transfer from [facility] for substance abuse detox.    #Schizoaffective disorder depressed type -cont Risperdal  1 mg BID -cont Lexapro  10 mg qdaily   #cocaine abuse -will explore CDIOP with possible discharge on Monday   #Alcohol abuse - CIWA finished. Continue naltrexone  50 mg qdaily    Medical -Asa 81 mg daily for CAD, Atorvastatin  for HLD, inhaler restarted -CPAP to be followed up with primary care after discharge -added nitrostat  for angina -added protonix  for GERD   HTN- Losartan  changed to clonidine  0.1mg  PO BID, which may also help with addiction.   Corean Anette Potters, MD 09/10/2023 11:56 AM

## 2023-09-10 NOTE — Discharge Instructions (Signed)
 You were seen in the emerged part given concern for chest pain Your blood work EKG and chest x-ray all look okay There is no evidence of heart attack at this time It is important that you follow-up with your primary care doctor within the next week for reevaluation Continue taking all previous prescribed medications Return to the Emergency Department for chest pain trouble breathing or any other concerns

## 2023-09-11 DIAGNOSIS — F1910 Other psychoactive substance abuse, uncomplicated: Secondary | ICD-10-CM | POA: Diagnosis not present

## 2023-09-11 DIAGNOSIS — R079 Chest pain, unspecified: Secondary | ICD-10-CM | POA: Diagnosis not present

## 2023-09-11 MED ORDER — FLUTICASONE FUROATE-VILANTEROL 100-25 MCG/ACT IN AEPB
1.0000 | INHALATION_SPRAY | Freq: Every day | RESPIRATORY_TRACT | Status: DC
  Administered 2023-09-11 – 2023-09-13 (×5): 1 via RESPIRATORY_TRACT
  Filled 2023-09-11: qty 28

## 2023-09-11 MED ORDER — NICOTINE 14 MG/24HR TD PT24
14.0000 mg | MEDICATED_PATCH | Freq: Every day | TRANSDERMAL | Status: DC
  Administered 2023-09-11 – 2023-09-13 (×5): 14 mg via TRANSDERMAL
  Filled 2023-09-11 (×3): qty 1

## 2023-09-11 NOTE — Group Note (Signed)
 Group Topic: Social Support  Group Date: 09/11/2023 Start Time: 1500 End Time: 1520 Facilitators: Krista Som, Zane HERO, RN  Department: [facility]  Number of Participants: 1  Group Focus: check in Treatment Modality:  Individual Therapy Interventions utilized were patient education Purpose: increase insight  Name: Raymond Mcclure Date of Birth: [birth year]  MR: [id number]    Level of Participation: when cued Quality of Participation: cooperative Interactions with others: gave feedback Mood/Affect: positive Triggers (if applicable): None identified at this time Cognition: coherent/clear Progress: Gaining insight Response: Patient voiced no concerns regarding the unit at this time. Patient in no apparent acute distress. Writer informed to alert staff if any questions or concerns arise. 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

## 2023-09-11 NOTE — ED Notes (Signed)
 MHT Washed pts clothes and put them back in the locker.

## 2023-09-11 NOTE — ED Notes (Signed)

## 2023-09-11 NOTE — ED Notes (Addendum)
 Washed and dried pts clothes and put them back in his locker.

## 2023-09-11 NOTE — Group Note (Signed)
 Group Topic: Understanding Self  Group Date: 09/11/2023 Start Time: 8069 End Time: 2000 Facilitators: Verdon Jacqualyn BRAVO, NT  Department: [facility]  Number of Participants: 7  Group Focus: clarity of thought Treatment Modality:  Individual Therapy Interventions utilized were group exercise Purpose: reinforce self-care  Name: Raymond Mcclure Date of Birth: [birth year]  MR: [id number]    Level of Participation: active Quality of Participation: cooperative Interactions with others: gave feedback Mood/Affect: appropriate Triggers (if applicable): n/a Cognition: coherent/clear Progress: Moderate Response: self care- having patience with others, what will make me happy is completing any of my goals, making sure my barn is built, making sure I don't spend too much money Plan: follow-up needed  Patients Problems:  Patient Active Problem List   Diagnosis Date Noted   Schizoaffective disorder, depressive type (HCC) 03/04/2023   Stimulant use disorder 03/04/2023   Alcohol use disorder, severe, dependence (HCC) 03/04/2023   Pulmonary nodules 02/12/2022   History of radiofrequency ablation procedure for cardiac arrhythmia 08/27/2021   Primary hypertension 08/27/2021   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

## 2023-09-11 NOTE — ED Provider Notes (Addendum)
 Behavioral Health Progress Note  Date and Time: 09/11/2023 11:05 AM Name: Raymond Mcclure MRN:  [id number]  Subjective:  Kdyn was seen in his room this morning on rounds. He feels that he is doing better after getting more sleep last night. He is anxious about discharge planning but is hopeful about being ready by Monday. We discuss barriers to care, and some of the difficulties with finding local residential treatment. He expressed understanding, and ruminates about possibly seeking care in [place] where he has family. He is not having issues with hallucinations, thoughts of harm to self or others. No side effects, no new physical complaints.   Diagnosis:  Final diagnoses:  Polysubstance abuse (HCC)    Total Time spent with patient: 15 minutes  Past Psychiatric History: Prior diagnoses: Schizoaffective disorder, bipolar type, schiozaffective disorder, depressed type Prior  psychiatric medications: Patient appears to be taking Wellbutrin  and naltrexone , Risperdal  1 mg BID Psychiatric medication history/compliance: Abilify , naltrexone , Wellbutrin  Psychiatric hospitalization(s): Patient last hospitalized at [facility] in 03/2023 as noted aovePreviously hospitalized 04/2021 at [facility] for suicide attempt (attempting to jump off a parking deck) and substance use. History of suicide (obtained from HPI): 4 previous suicide attempts, last 2 years ago (OD on fentanyl ) requiring hospitalization History of homicide or aggression: Per public offender registry, patient was imprisoned from 1999-2005. Past Medical History: see chart  Family History:  Daughter has bipolar disorder.  Cousin has a history of schizophrenia. Suicide history: denied.    Social History: children live with mother in [place] , limited social support. 2 sisters. Lives in his mother's house with niece and received disability.  Sleep: Good  Appetite:  Good  Current Medications:  Current Facility-Administered Medications  Medication Dose  Route Frequency Provider Last Rate Last Admin   acetaminophen  (TYLENOL ) tablet 650 mg  650 mg Oral Q6H PRN Valina Maes, Corean Massa, MD       albuterol  (VENTOLIN  HFA) 108 (90 Base) MCG/ACT inhaler 2 puff  2 puff Inhalation Q4H PRN Kate Sweetman, Corean Massa, MD       alum & mag hydroxide-simeth (MAALOX/MYLANTA) 200-200-20 MG/5ML suspension 30 mL  30 mL Oral Q4H PRN Keishawna Carranza, Corean Massa, MD       atorvastatin  (LIPITOR) tablet 40 mg  40 mg Oral QHS Adrianah Prophete, Corean Massa, MD   40 mg at 09/10/23 2116   cloNIDine  (CATAPRES ) tablet 0.1 mg  0.1 mg Oral BID Leigh Corean Massa, MD   0.1 mg at 09/11/23 1008   haloperidol  (HALDOL ) tablet 5 mg  5 mg Oral TID PRN Leigh Corean Massa, MD       And   diphenhydrAMINE  (BENADRYL ) capsule 50 mg  50 mg Oral TID PRN Leigh Corean Massa, MD       haloperidol  lactate (HALDOL ) injection 5 mg  5 mg Intramuscular TID PRN Leigh Corean Massa, MD       And   diphenhydrAMINE  (BENADRYL ) injection 50 mg  50 mg Intramuscular TID PRN Leigh Corean Massa, MD       And   LORazepam  (ATIVAN ) injection 2 mg  2 mg Intramuscular TID PRN Leigh Corean Massa, MD       haloperidol  lactate (HALDOL ) injection 10 mg  10 mg Intramuscular TID PRN Leigh Corean Massa, MD       And   diphenhydrAMINE  (BENADRYL ) injection 50 mg  50 mg Intramuscular TID PRN Leigh Corean Massa, MD       And   LORazepam  (ATIVAN ) injection 2 mg  2 mg Intramuscular TID PRN Leigh Corean  Leigh, MD       escitalopram  (LEXAPRO ) tablet 10 mg  10 mg Oral Daily Lota Leamer, Corean Massa, MD   10 mg at 09/11/23 1008   hydrOXYzine  (ATARAX ) tablet 25 mg  25 mg Oral TID PRN Leigh Corean Massa, MD       magnesium  hydroxide (MILK OF MAGNESIA) suspension 30 mL  30 mL Oral Daily PRN Eriverto Byrnes, Corean Massa, MD       melatonin tablet 5 mg  5 mg Oral QHS Tarrin Lebow, Corean Massa, MD   5 mg at 09/10/23 2116   mirtazapine  (REMERON ) tablet 7.5 mg  7.5 mg Oral QHS PRN Leigh Corean Massa, MD       naltrexone  (DEPADE) tablet  50 mg  50 mg Oral Daily Avilyn Virtue, Corean Massa, MD   50 mg at 09/11/23 1007   nitroGLYCERIN  (NITROSTAT ) SL tablet 0.4 mg  0.4 mg Sublingual Q5 min PRN Leigh Corean Massa, MD       pantoprazole  (PROTONIX ) EC tablet 40 mg  40 mg Oral Daily Zachary Nole, Corean Massa, MD   40 mg at 09/11/23 1007   risperiDONE  (RISPERDAL ) tablet 1 mg  1 mg Oral BID Leigh Corean Massa, MD   1 mg at 09/11/23 1008   No current outpatient medications on file.    Labs  Lab Results:  Admission on 09/10/2023, Discharged on 09/10/2023  Component Date Value Ref Range Status   Sodium 09/10/2023 136  135 - 145 mmol/L Final   Potassium 09/10/2023 4.4  3.5 - 5.1 mmol/L Final   HEMOLYSIS AT THIS LEVEL MAY AFFECT RESULT   Chloride 09/10/2023 103  98 - 111 mmol/L Final   CO2 09/10/2023 22  22 - 32 mmol/L Final   Glucose, Bld 09/10/2023 190 (H)  70 - 99 mg/dL Final   Glucose reference range applies only to samples taken after fasting for at least 8 hours.   BUN 09/10/2023 15  6 - 20 mg/dL Final   Creatinine, Ser 09/10/2023 1.02  0.61 - 1.24 mg/dL Final   Calcium  09/10/2023 8.9  8.9 - 10.3 mg/dL Final   GFR, Estimated 09/10/2023 >60  >60 mL/min Final   Comment: (NOTE) Calculated using the CKD-EPI Creatinine Equation (2021)    Anion gap 09/10/2023 11  5 - 15 Final   Performed at [facility], [address] [place], [place] [address]   WBC 09/10/2023 11.9 (H)  4.0 - 10.5 K/uL Final   RBC 09/10/2023 4.41  4.22 - 5.81 MIL/uL Final   Hemoglobin 09/10/2023 13.9  13.0 - 17.0 g/dL Final   HCT 91/90/7974 41.5  39.0 - 52.0 % Final   MCV 09/10/2023 94.1  80.0 - 100.0 fL Final   MCH 09/10/2023 31.5  26.0 - 34.0 pg Final   MCHC 09/10/2023 33.5  30.0 - 36.0 g/dL Final   RDW 91/90/7974 12.6  11.5 - 15.5 % Final   Platelets 09/10/2023 312  150 - 400 K/uL Final   nRBC 09/10/2023 0.0  0.0 - 0.2 % Final   Performed at [facility], [address] [place], [place] [address]   Troponin I (High Sensitivity) 09/10/2023 5   <18 ng/L Final   Comment: (NOTE) Elevated high sensitivity troponin I (hsTnI) values and significant  changes across serial measurements may suggest ACS but many other  chronic and acute conditions are known to elevate hsTnI results.  Refer to the Links section for chest pain algorithms and additional  guidance. Performed at [facility], [address] [place], [place] [address]    Troponin I (High Sensitivity) 09/10/2023 6  <18 ng/L Final   Comment: (NOTE) Elevated high sensitivity troponin I (hsTnI) values and significant  changes across serial measurements may suggest ACS but many other  chronic and acute conditions are known to elevate hsTnI results.  Refer to the Links section for chest pain algorithms and additional  guidance. Performed at [facility], [address] [place], [place] [address]   Admission on 09/01/2023, Discharged on 09/02/2023  Component Date Value Ref Range Status   Sodium 09/01/2023 140  135 - 145 mmol/L Final   Potassium 09/01/2023 4.2  3.5 - 5.1 mmol/L Final   Chloride 09/01/2023 108  98 - 111 mmol/L Final   CO2 09/01/2023 23  22 - 32 mmol/L Final   Glucose, Bld 09/01/2023 101 (H)  70 - 99 mg/dL Final   Glucose reference range applies only to samples taken after fasting for at least 8 hours.   BUN 09/01/2023 11  6 - 20 mg/dL Final   Creatinine, Ser 09/01/2023 1.05  0.61 - 1.24 mg/dL Final   Calcium  09/01/2023 9.5  8.9 - 10.3 mg/dL Final   Total Protein 92/68/7974 7.8  6.5 - 8.1 g/dL Final   Albumin 92/68/7974 3.9  3.5 - 5.0 g/dL Final   AST 92/68/7974 27  15 - 41 U/L Final   ALT 09/01/2023 25  0 - 44 U/L Final   Alkaline Phosphatase 09/01/2023 106  38 - 126 U/L Final   Total Bilirubin 09/01/2023 1.0  0.0 - 1.2 mg/dL Final   GFR, Estimated 09/01/2023 >60  >60 mL/min Final   Comment: (NOTE) Calculated using the CKD-EPI Creatinine Equation (2021)    Anion gap 09/01/2023 9  5 - 15 Final   Performed at [facility], [address], [place], [place] [address]   Alcohol, Ethyl (B) 09/01/2023 <15  <15 mg/dL Final   Comment: (NOTE) For medical purposes only. Performed at [facility], [address] [place], [place] [address]    WBC 09/01/2023 9.8  4.0 - 10.5 K/uL Final   RBC 09/01/2023 4.79  4.22 - 5.81 MIL/uL Final   Hemoglobin 09/01/2023 15.3  13.0 - 17.0 g/dL Final   HCT 92/68/7974 45.2  39.0 - 52.0 % Final   MCV 09/01/2023 94.4  80.0 - 100.0 fL Final   MCH 09/01/2023 31.9  26.0 - 34.0 pg Final   MCHC 09/01/2023 33.8  30.0 - 36.0 g/dL Final   RDW 92/68/7974 13.1  11.5 - 15.5 % Final   Platelets 09/01/2023 324  150 - 400 K/uL Final   nRBC 09/01/2023 0.0  0.0 - 0.2 % Final   Performed at [facility], [address], [place], [place] [address]   Opiates 09/01/2023 NONE DETECTED  NONE DETECTED Final   Cocaine 09/01/2023 POSITIVE (A)  NONE DETECTED Final   Benzodiazepines 09/01/2023 NONE DETECTED  NONE DETECTED Final   Amphetamines 09/01/2023 NONE DETECTED  NONE DETECTED Final   Tetrahydrocannabinol 09/01/2023 NONE DETECTED  NONE DETECTED Final   Barbiturates 09/01/2023 NONE DETECTED  NONE DETECTED Final   Comment: (NOTE) DRUG SCREEN FOR MEDICAL PURPOSES ONLY.  IF CONFIRMATION IS NEEDED FOR ANY PURPOSE, NOTIFY LAB WITHIN 5 DAYS.  LOWEST DETECTABLE LIMITS FOR URINE DRUG SCREEN Drug Class                     Cutoff (ng/mL) Amphetamine and metabolites  1000 Barbiturate and metabolites    200 Benzodiazepine                 200 Opiates and metabolites        300 Cocaine and metabolites        300 THC                            50 Performed at [facility], [address] [place], [place] [address]     Blood Alcohol level:  Lab Results  Component Value Date   [facility] <15 09/01/2023   ETH <10 03/03/2023    Metabolic Disorder Labs: Lab Results  Component Value Date   HGBA1C 5.7 (H) 03/03/2023   MPG 116.89 03/03/2023    Lab Results  Component Value Date   PROLACTIN 31.2 (H) 03/08/2023   Lab Results  Component Value Date   CHOL 159 03/03/2023   TRIG 65 03/03/2023   HDL 39 (L) 03/03/2023   CHOLHDL 4.1 03/03/2023   VLDL 13 03/03/2023   LDLCALC 107 (H) 03/03/2023   LDLCALC (H) 07/15/2006    102        Total Cholesterol/HDL:CHD Risk Coronary Heart Disease Risk Table                     Men   Women  1/2 Average Risk   3.4   3.3    Therapeutic Lab Levels: No results found for: LITHIUM No results found for: VALPROATE No results found for: CBMZ  Physical Findings   AUDIT    Flowsheet Row [facility]  Alcohol Use Disorder Identification Test Final Score (AUDIT) 30   GAD-7    Flowsheet Row Office Visit from 08/13/2021 in [facility] - [facility] Dept Of [place]. [facility] Office Visit from 06/12/2021 in [facility]  Total GAD-7 Score 16 20   PHQ2-9    Flowsheet Row ED from 09/02/2023 in [facility] Office Visit from 02/10/2022 in [facility] - A Dept Of [place]. [facility] Office Visit from 09/09/2021 in [facility] - A Dept Of [place]. [facility] Office Visit from 08/27/2021 in [facility] - A Dept Of [place]. [facility] Office Visit from 08/20/2021 in [facility] - A Dept Of [place]. [facility]  PHQ-2 Total Score 3 0 3 1 6   PHQ-9 Total Score 14 -- 15 13 27    Flowsheet Row ED from 09/10/2023 in [facility] Most recent reading at 09/10/2023  8:06 PM ED from 09/10/2023 in [facility] Emergency Department at [facility] Most recent reading at 09/10/2023  2:13 AM ED from 09/02/2023 in [facility] Most recent reading at 09/02/2023  2:48 AM  C-SSRS RISK CATEGORY Error: Question 6  not populated No Risk Low Risk     Musculoskeletal  Strength & Muscle Tone: within normal limits Gait & Station: normal Patient leans: N/A  Psychiatric Specialty Exam  Presentation  General Appearance:  Casual  Eye Contact: Good  Speech: Normal Rate  Speech Volume: Normal  Handedness: Right   Mood and Affect  Mood: Anxious  Affect: Congruent   Thought Process  Thought Processes: Linear  Descriptions of Associations:Intact  Orientation:Full (  Time, Place and Person)  Thought Content:Logical  Diagnosis of Schizophrenia or Schizoaffective disorder in past: No data recorded   Hallucinations:Hallucinations: None  Ideas of Reference:None  Suicidal Thoughts:Suicidal Thoughts: No  Homicidal Thoughts:Homicidal Thoughts: No   Sensorium  Memory: Immediate Good; Recent Good; Remote Fair  Judgment: Fair  Insight: Fair   Art therapist  Concentration: Good  Attention Span: Good  Recall: Good  Fund of Knowledge: Good  Language: Good   Psychomotor Activity  Psychomotor Activity: Psychomotor Activity: Normal   Assets  Assets: Communication Skills; Desire for Improvement; Leisure Time; Housing   Sleep  Sleep: Sleep: Good  Estimated Sleeping Duration (Last 24 Hours): 6.00-8.00 hours  No data recorded  Physical Exam  Physical Exam Vitals and nursing note reviewed.  Constitutional:      Appearance: Normal appearance.  HENT:     Head: Normocephalic.  Eyes:     Extraocular Movements: Extraocular movements intact.  Pulmonary:     Effort: Pulmonary effort is normal.  Musculoskeletal:        General: Normal range of motion.     Cervical back: Normal range of motion.  Neurological:     General: No focal deficit present.     Mental Status: He is alert and oriented to person, place, and time.  Psychiatric:        Behavior: Behavior normal.    Review of Systems  Constitutional:  Negative for chills and fever.   Respiratory:  Negative for cough.   Cardiovascular:  Negative for chest pain.  Gastrointestinal:  Negative for vomiting.  Musculoskeletal:  Positive for joint pain.  Psychiatric/Behavioral:  Negative for hallucinations and suicidal ideas.    Blood pressure 119/83, pulse 74, temperature 97.8 F (36.6 C), resp. rate 17, SpO2 98%. There is no height or weight on file to calculate BMI.  Treatment Plan Summary: Daily contact with patient to assess and evaluate symptoms and progress in treatment, Medication management, and Plan as below   58 y/o male with a past psychiatric history of polysubstance abuse (cocaine, cannabis), with a bipolar 1 disorder.  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 presented to [facility] as a transfer from [facility] for substance abuse detox.    #Schizoaffective disorder depressed type -cont Risperdal  1 mg BID -cont Lexapro  10 mg qdaily   #cocaine abuse -will explore CDIOP with possible discharge on Monday   #Alcohol abuse - CIWA finished. Continue naltrexone  50 mg qdaily    Medical -Asa 81 mg daily for CAD, Atorvastatin  for HLD, inhaler restarted -CPAP to be followed up with primary care after discharge -added nitrostat  for angina -added protonix  for GERD   HTN- Losartan  changed to clonidine  0.1mg  PO BID, which may also help with addiction.   Corean Anette Potters, MD 09/11/2023 11:05 AM

## 2023-09-11 NOTE — Group Note (Unsigned)
 Group Topic: Communication  Group Date: 09/11/2023 Start Time: 0830 End Time: 0900 Facilitators: Laneta Renea POUR, NT  Department: [facility]  Number of Participants: 5  Group Focus: communication Treatment Modality:  Psychoeducation Interventions utilized were problem solving Purpose: improve communication skills   Name: Raymond Mcclure Date of Birth: Jun 14, 1965  MR: [id number]    Level of Participation: {THERAPIES; PSYCH GROUP PARTICIPATION OZCZO:76008} Quality of Participation: {THERAPIES; PSYCH QUALITY OF PARTICIPATION:23992} Interactions with others: {THERAPIES; PSYCH INTERACTIONS:23993} Mood/Affect: {THERAPIES; PSYCH MOOD/AFFECT:23994} Triggers (if applicable): *** Cognition: {THERAPIES; PSYCH COGNITION:23995} Progress: {THERAPIES; PSYCH PROGRESS:23997} Response: *** Plan: {THERAPIES; PSYCH EOJW:76003}  Patients Problems:  Patient Active Problem List   Diagnosis Date Noted   Schizoaffective disorder, depressive type (HCC) 03/04/2023   Stimulant use disorder 03/04/2023   Alcohol use disorder, severe, dependence (HCC) 03/04/2023   Pulmonary nodules 02/12/2022   History of radiofrequency ablation procedure for cardiac arrhythmia 08/27/2021   Primary hypertension 08/27/2021   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

## 2023-09-11 NOTE — Group Note (Signed)
 Group Topic: Communication  Group Date: 09/11/2023 Start Time: 0830 End Time: 0900 Facilitators: Laneta Renea POUR, NT  Department: [facility]  Number of Participants: 5  Group Focus: communication Treatment Modality:  Psychoeducation Interventions utilized were patient education Purpose: increase insight  Name: Raymond Mcclure Date of Birth: 1966-01-25  MR: [id number]    Level of Participation: active Quality of Participation: attentive, cooperative, and engaged Interactions with others: gave feedback Mood/Affect: appropriate Triggers (if applicable): N/A Cognition: coherent/clear and insightful Progress: Gaining insight Response: positive and stated that he wants to get better and remains engage in his recovery.  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

## 2023-09-11 NOTE — ED Notes (Signed)
 Patient is in the bedroom calm and sleeping with eyes closed. NAD.

## 2023-09-11 NOTE — ED Notes (Signed)
 Patient sitting in dayroom, calm and composed. No acute distress noted. No concerns voiced. No inappropriate behaviors observed or reported at this time. Informed patient to notify staff with any needs or assistance. Patient verbalized understanding or agreement. Safety checks in place per facility policy.

## 2023-09-12 DIAGNOSIS — R079 Chest pain, unspecified: Secondary | ICD-10-CM | POA: Diagnosis not present

## 2023-09-12 MED ORDER — MIRTAZAPINE 7.5 MG PO TABS
7.5000 mg | ORAL_TABLET | Freq: Every day | ORAL | Status: DC
  Administered 2023-09-12 (×2): 7.5 mg via ORAL
  Filled 2023-09-12: qty 1

## 2023-09-12 NOTE — ED Notes (Signed)
 Pt observed lying in bed. Eyes closed respirations even and non labored. NAD q 15 minute observations continue for safety.

## 2023-09-12 NOTE — ED Notes (Signed)
 Patient is sleeping. Respirations equal and unlabored. No change in assessment or acuity. Routine safety checks conducted according to facility protocol.

## 2023-09-12 NOTE — ED Notes (Signed)
 Pt has been calm and cooperative thus far this shift.  Behaviors appropriate.  Observable in milieu interacting with peers Denied SI/HI and A/V hallucinations Q 15 minute observations for safety continue

## 2023-09-12 NOTE — ED Provider Notes (Signed)
 Patient presents with polysubstance abuse (cocaine, EtOH) and history of somatic health issues including hypertension, obesity, dyslipidemia, and obstructive sleep apnea. Somatic medication regimen has been well-tolerated for most medical issues. Although patient has utilized CPAP for OSA in the past, at this time he does not require CPAP. Symptoms of OSA have decreased since April 2025. Patient has experienced symptom reduction off CPAP since April 2025. Patient can be safely managed in a residential substance program without CPAP, but we encourage follow-up with pulmonology after completion of substance treatment program in 3 - 6 months.

## 2023-09-12 NOTE — Care Management (Signed)
 [facility] Care Management   Incoming social worker will follow up with a letter to [facility] from Dr. Cole stating that the patient does not need a C-pap machine.

## 2023-09-12 NOTE — Group Note (Signed)
 Group Topic: Emotional Regulation  Group Date: 09/12/2023 Start Time: 1330 End Time: 1400 Facilitators: Laneta Renea POUR, NT  Department: [facility]  Number of Participants: 5  Group Focus: check in Treatment Modality:  Psychoeducation Interventions utilized were exploration, patient education, and problem solving Purpose: increase insight  Name: RAYFIELD BEEM Date of Birth: 22-Aug-1965  MR: [id number]    Level of Participation: active Quality of Participation: cooperative and engaged Interactions with others: gave feedback Mood/Affect: appropriate and bright Triggers (if applicable): N/A Cognition: coherent/clear and insightful Progress: Gaining insight Response: wanting to feel better.  

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
## 2023-09-12 NOTE — ED Notes (Signed)
 Patient is in the bedroom composed and sleeping with eyes closed.  NAD.  Respirations even and unlabored. Environment secured per policy. Will monitor for safety.

## 2023-09-12 NOTE — ED Notes (Addendum)
 Patient asleep in his room. NAD. Respirations even and unlabored. Will continue to monitor for safety

## 2023-09-12 NOTE — ED Notes (Signed)
 Pt is sleeping. No acute distress noted.

## 2023-09-13 DIAGNOSIS — R079 Chest pain, unspecified: Secondary | ICD-10-CM | POA: Diagnosis not present

## 2023-09-13 DIAGNOSIS — F1910 Other psychoactive substance abuse, uncomplicated: Secondary | ICD-10-CM | POA: Diagnosis not present

## 2023-09-13 MED ORDER — NALTREXONE HCL 50 MG PO TABS: 50.0000 mg | ORAL_TABLET | Freq: Every day | ORAL | 0 refills | Status: DC

## 2023-09-13 MED ORDER — FLUTICASONE FUROATE-VILANTEROL 100-25 MCG/ACT IN AEPB: 1.0000 | INHALATION_SPRAY | Freq: Every day | RESPIRATORY_TRACT | 0 refills | Status: DC

## 2023-09-13 MED ORDER — PANTOPRAZOLE SODIUM 40 MG PO TBEC: 40.0000 mg | DELAYED_RELEASE_TABLET | Freq: Every day | ORAL | 0 refills | Status: DC

## 2023-09-13 MED ORDER — ESCITALOPRAM OXALATE 10 MG PO TABS: 10.0000 mg | ORAL_TABLET | Freq: Every day | ORAL | 0 refills | Status: DC

## 2023-09-13 MED ORDER — RISPERIDONE 1 MG PO TABS: 1.0000 mg | ORAL_TABLET | Freq: Two times a day (BID) | ORAL | 0 refills | Status: DC

## 2023-09-13 MED ORDER — HYDROXYZINE HCL 25 MG PO TABS: 25.0000 mg | ORAL_TABLET | Freq: Three times a day (TID) | ORAL | 0 refills | Status: DC | PRN

## 2023-09-13 MED ORDER — MIRTAZAPINE 7.5 MG PO TABS: 7.5000 mg | ORAL_TABLET | Freq: Every day | ORAL | 0 refills | Status: DC

## 2023-09-13 MED ORDER — ATORVASTATIN CALCIUM 40 MG PO TABS: 40.0000 mg | ORAL_TABLET | Freq: Every day | ORAL | 0 refills | Status: DC

## 2023-09-13 MED ORDER — MELATONIN 5 MG PO TABS: 5.0000 mg | ORAL_TABLET | Freq: Every day | ORAL | 0 refills | Status: DC

## 2023-09-13 NOTE — Discharge Instructions (Addendum)
 Intake for Individual Therapy: Thursday, August 14th, at 1 p.m. at the [employer] [employer]. Elsie Maier, LCSW.   Based on the information that you have provided and the presenting issues outpatient services and resources for have been recommended.  It is imperative that you follow through with treatment recommendations within 5-7 days from the of discharge to mitigate further risk to your safety and mental well-being. A list of referrals has been provided below to get you started.  You are not limited to the list provided.  In case of an urgent crisis, you may contact the Mobile Crisis Unit with [facility] at [phone].               CD_IOP Program in [facility]    [facility] [address] [place], [place] [address]  [phone]    [facility] [address] [place], [place] [address]  [phone]    [facility] [address]  [place], [place] [address] [phone]    [facility] [address]  [place], [place] [address] [phone]    [facility]  [address] [place] , [address] [phone]   [facility] [address] [place], [place]  [address] [phone] [facility] Pay only)   [facility] [address] [place], [place] , [address] [phone]   [facility] of the [facility] [address] [place], [place] , [address] [phone]   [facility] [place]  [facility] [address], [address] [place], [place] , [address] [phone]

## 2023-09-13 NOTE — ED Notes (Signed)
 Pt reports that is his feeling excellent and that his sleep has improved since admission to facility. Pt denies si hi and avh- verbal contract for safety provided. Pt c/o chronic neck and bilateral shoulder pain- declined prn tylenol . Pt was provided with breakfast.

## 2023-09-13 NOTE — ED Notes (Signed)
 Discharge paperwork was discussed with pt including follow up care. Questions denied. Pt was provided with bus pass and was escorted off unit by staff. Items returned.

## 2023-09-13 NOTE — ED Notes (Signed)
 Patient is in the bedroom sleeping. NAD

## 2023-09-13 NOTE — ED Provider Notes (Signed)
 FBC/OBS ASAP Discharge Summary  Date and Time: 09/13/2023 10:16 AM  Name: Raymond Mcclure  MRN:  [id number]   Discharge Diagnoses:  Final diagnoses:  Polysubstance abuse [facility])  MDD w/ psychotic features vs schizoaffective disorder  Subjective: Raymond Mcclure is ready to discharge today. The available residential programs were not preferred but he's open to engaging with individual therapist upon discharge today. Patient denies current w/d symptoms or cravings. He denies SI/HI/AVH. He is eager to return home ASAP.  Stay Summary: 58 y/o male with a past psychiatric history of polysubstance abuse (cocaine, cannabis), with a bipolar 1 disorder. 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 presented to [facility] as a transfer from [facility] for substance abuse detox.   Patient resumed majority of outpatient medication regimen and generally engaged in milieu activities. Continued risperidone  1mg  BID, escitalopram  10mg  qAM. CIWA initiated for alcohol w/d monitoring with generally low scores and discontinued 8/10 but patient continued naltrexone  50mg  every day. For chronic medical issues patient given ASA 81mg , atorvastatin , and pantoprazole . Past regimen of losartan  changed to clonidine  0.1mg  BID for HTN control and withdrawal sxs. Patient continued to complain of insomnia so mirtazpine 7.5mg  at bedtime added to regimen 8/11 for middle/terminal insomnia.  On my assessment the patient denied SI, HI, AVH, paranoia, ideas of reference, or first rank symptoms on day of discharge 09/13/23. Patient denied drug cravings or active signs of withdrawal. Patient denied medication side-effects. Patient was not deemed to be a danger to self or others on day of discharge and was in agreement  with discharge plans to return home and establish with outpatient substance treatment in individual therapy Thursday, August 14th.   Patient advised to monitor BP daily and schedule f/u with PCP to determine appropriate antihypertensive regimen. Clonidine  during inpatient was well-tolerated but not a first-line treatment.  Tobacco Cessation:  Total Time spent with patient: I personally spent 25 minutes on the unit in direct patient care. The direct patient care time included face-to-face time with the patient, reviewing the patient's chart, communicating with other professionals, and coordinating care. Greater than 50% of this time was spent in counseling or coordinating care with the patient regarding goals of hospitalization, psycho-education, and discharge planning needs.   Past Psychiatric History: Prior diagnoses: Schizoaffective disorder, bipolar type, schiozaffective disorder, depressed type Prior  psychiatric medications: Patient appears to be taking Wellbutrin  and naltrexone , Risperdal  1 mg BID Psychiatric medication history/compliance: Abilify , naltrexone , Wellbutrin  Psychiatric hospitalization(s): Patient last hospitalized at [facility] in 03/2023 as noted aovePreviously hospitalized 04/2021 at [facility] for suicide attempt (attempting to jump off a parking deck) and substance use. History of suicide (obtained from HPI): 4 previous suicide attempts, last 2 years ago (OD on fentanyl ) requiring hospitalization History of homicide or aggression: Per public offender registry, patient was imprisoned from 1999-2005. Past Medical History: see chart  Family History:  Daughter has bipolar disorder.  Cousin has a history of schizophrenia. Suicide history: denied.    Social History: children live with mother in [place] , limited social support. 2 sisters. Lives in his mother's house with niece and received disability.   Sleep: Fair   Appetite:  Good  Current Medications:  Current  Facility-Administered Medications  Medication Dose Route Frequency Provider Last Rate Last Admin   acetaminophen  (TYLENOL ) tablet 650 mg  650 mg Oral Q6H PRN Hill,  Corean Massa, MD       albuterol  (VENTOLIN  HFA) 108 (90 Base) MCG/ACT inhaler 2 puff  2 puff Inhalation Q4H PRN Hill, Corean Massa, MD       alum & mag hydroxide-simeth (MAALOX/MYLANTA) 200-200-20 MG/5ML suspension 30 mL  30 mL Oral Q4H PRN Hill, Corean Massa, MD       atorvastatin  (LIPITOR) tablet 40 mg  40 mg Oral QHS Hill, Corean Massa, MD   40 mg at 09/12/23 2151   cloNIDine  (CATAPRES ) tablet 0.1 mg  0.1 mg Oral BID Leigh Corean Massa, MD   0.1 mg at 09/13/23 0913   haloperidol  (HALDOL ) tablet 5 mg  5 mg Oral TID PRN Leigh Corean Massa, MD       And   diphenhydrAMINE  (BENADRYL ) capsule 50 mg  50 mg Oral TID PRN Leigh Corean Massa, MD       haloperidol  lactate (HALDOL ) injection 5 mg  5 mg Intramuscular TID PRN Leigh Corean Massa, MD       And   diphenhydrAMINE  (BENADRYL ) injection 50 mg  50 mg Intramuscular TID PRN Leigh Corean Massa, MD       And   LORazepam  (ATIVAN ) injection 2 mg  2 mg Intramuscular TID PRN Leigh Corean Massa, MD       haloperidol  lactate (HALDOL ) injection 10 mg  10 mg Intramuscular TID PRN Leigh Corean Massa, MD       And   diphenhydrAMINE  (BENADRYL ) injection 50 mg  50 mg Intramuscular TID PRN Leigh Corean Massa, MD       And   LORazepam  (ATIVAN ) injection 2 mg  2 mg Intramuscular TID PRN Leigh Corean Massa, MD       escitalopram  (LEXAPRO ) tablet 10 mg  10 mg Oral Daily Hill, Corean Massa, MD   10 mg at 09/13/23 0913   fluticasone  furoate-vilanterol (BREO ELLIPTA ) 100-25 MCG/ACT 1 puff  1 puff Inhalation Daily Leigh Corean Massa, MD   1 puff at 09/13/23 0914   hydrOXYzine  (ATARAX ) tablet 25 mg  25 mg Oral TID PRN Leigh Corean Massa, MD       magnesium  hydroxide (MILK OF MAGNESIA) suspension 30 mL  30 mL Oral Daily PRN Hill, Corean Massa, MD        melatonin tablet 5 mg  5 mg Oral QHS Hill, Corean Massa, MD   5 mg at 09/12/23 2151   mirtazapine  (REMERON ) tablet 7.5 mg  7.5 mg Oral QHS Shaquella Stamant C, MD   7.5 mg at 09/12/23 2151   naltrexone  (DEPADE) tablet 50 mg  50 mg Oral Daily Leigh Corean Massa, MD   50 mg at 09/13/23 9085   nicotine  (NICODERM CQ  - dosed in mg/24 hours) patch 14 mg  14 mg Transdermal Daily Leigh Corean Massa, MD   14 mg at 09/13/23 0914   nitroGLYCERIN  (NITROSTAT ) SL tablet 0.4 mg  0.4 mg Sublingual Q5 min PRN Leigh Corean Massa, MD       pantoprazole  (PROTONIX ) EC tablet 40 mg  40 mg Oral Daily Hill, Corean Massa, MD   40 mg at 09/13/23 0913   risperiDONE  (RISPERDAL ) tablet 1 mg  1 mg Oral BID Leigh Corean Massa, MD   1 mg at 09/13/23 9086   Current Outpatient Medications  Medication Sig Dispense Refill   atorvastatin  (LIPITOR) 40 MG tablet Take 1 tablet (40 mg total) by mouth at bedtime. 30 tablet 0   escitalopram  (LEXAPRO ) 10 MG tablet Take 1 tablet (10 mg total) by mouth daily. 30 tablet  0   fluticasone  furoate-vilanterol (BREO ELLIPTA ) 100-25 MCG/ACT AEPB Inhale 1 puff into the lungs daily. 1 each 0   hydrOXYzine  (ATARAX ) 25 MG tablet Take 1 tablet (25 mg total) by mouth 3 (three) times daily as needed for anxiety. 30 tablet 0   melatonin 5 MG TABS Take 1 tablet (5 mg total) by mouth at bedtime. 30 tablet 0   mirtazapine  (REMERON ) 7.5 MG tablet Take 1 tablet (7.5 mg total) by mouth at bedtime. 30 tablet 0   naltrexone  (DEPADE) 50 MG tablet Take 1 tablet (50 mg total) by mouth daily. 30 tablet 0   pantoprazole  (PROTONIX ) 40 MG tablet Take 1 tablet (40 mg total) by mouth daily. 30 tablet 0   risperiDONE  (RISPERDAL ) 1 MG tablet Take 1 tablet (1 mg total) by mouth 2 (two) times daily. 60 tablet 0    PTA Medications:  Facility Ordered Medications  Medication   acetaminophen  (TYLENOL ) tablet 650 mg   alum & mag hydroxide-simeth (MAALOX/MYLANTA) 200-200-20 MG/5ML suspension 30 mL   magnesium   hydroxide (MILK OF MAGNESIA) suspension 30 mL   haloperidol  (HALDOL ) tablet 5 mg   And   diphenhydrAMINE  (BENADRYL ) capsule 50 mg   haloperidol  lactate (HALDOL ) injection 5 mg   And   diphenhydrAMINE  (BENADRYL ) injection 50 mg   And   LORazepam  (ATIVAN ) injection 2 mg   haloperidol  lactate (HALDOL ) injection 10 mg   And   diphenhydrAMINE  (BENADRYL ) injection 50 mg   And   LORazepam  (ATIVAN ) injection 2 mg   risperiDONE  (RISPERDAL ) tablet 1 mg   escitalopram  (LEXAPRO ) tablet 10 mg   naltrexone  (DEPADE) tablet 50 mg   [COMPLETED] aspirin  chewable tablet 324 mg   atorvastatin  (LIPITOR) tablet 40 mg   albuterol  (VENTOLIN  HFA) 108 (90 Base) MCG/ACT inhaler 2 puff   nitroGLYCERIN  (NITROSTAT ) SL tablet 0.4 mg   melatonin tablet 5 mg   hydrOXYzine  (ATARAX ) tablet 25 mg   cloNIDine  (CATAPRES ) tablet 0.1 mg   pantoprazole  (PROTONIX ) EC tablet 40 mg   nicotine  (NICODERM CQ  - dosed in mg/24 hours) patch 14 mg   fluticasone  furoate-vilanterol (BREO ELLIPTA ) 100-25 MCG/ACT 1 puff   mirtazapine  (REMERON ) tablet 7.5 mg   PTA Medications  Medication Sig   atorvastatin  (LIPITOR) 40 MG tablet Take 1 tablet (40 mg total) by mouth at bedtime.   escitalopram  (LEXAPRO ) 10 MG tablet Take 1 tablet (10 mg total) by mouth daily.   hydrOXYzine  (ATARAX ) 25 MG tablet Take 1 tablet (25 mg total) by mouth 3 (three) times daily as needed for anxiety.   mirtazapine  (REMERON ) 7.5 MG tablet Take 1 tablet (7.5 mg total) by mouth at bedtime.   risperiDONE  (RISPERDAL ) 1 MG tablet Take 1 tablet (1 mg total) by mouth 2 (two) times daily.   pantoprazole  (PROTONIX ) 40 MG tablet Take 1 tablet (40 mg total) by mouth daily.   melatonin 5 MG TABS Take 1 tablet (5 mg total) by mouth at bedtime.   naltrexone  (DEPADE) 50 MG tablet Take 1 tablet (50 mg total) by mouth daily.   fluticasone  furoate-vilanterol (BREO ELLIPTA ) 100-25 MCG/ACT AEPB Inhale 1 puff into the lungs daily.       09/02/2023    1:57 AM 02/10/2022    2:07  PM 09/09/2021   11:11 AM  Depression screen PHQ 2/9  Decreased Interest 1 0 3  Down, Depressed, Hopeless 2 0 0  PHQ - 2 Score 3 0 3  Altered sleeping 2  3  Tired, decreased energy 2  3  Change in appetite 1  3  Feeling bad or failure about yourself  2  0  Trouble concentrating 1  2  Moving slowly or fidgety/restless 2  1  Suicidal thoughts 1  0  PHQ-9 Score 14  15  Difficult doing work/chores   Somewhat difficult    Flowsheet Row ED from 09/10/2023 in [facility] Most recent reading at 09/11/2023 12:35 PM ED from 09/10/2023 in [facility] at [facility] Most recent reading at 09/10/2023  2:13 AM ED from 09/02/2023 in [facility] Most recent reading at 09/02/2023  2:48 AM  C-SSRS RISK CATEGORY No Risk No Risk Low Risk    Musculoskeletal  Strength & Muscle Tone: within normal limits Gait & Station: normal Patient leans: N/A  Psychiatric Specialty Exam  Presentation  General Appearance:  Casual  Eye Contact: Good  Speech: Clear and Coherent  Speech Volume: Normal  Handedness: Right   Mood and Affect  Mood: Euthymic  Affect: Congruent   Thought Process  Thought Processes: Coherent  Descriptions of Associations:Intact  Orientation:Full (Time, Place and Person)  Thought Content:Logical  Diagnosis of Schizophrenia or Schizoaffective disorder in past: No data recorded   Hallucinations:Hallucinations: None  Ideas of Reference:None  Suicidal Thoughts:Suicidal Thoughts: No  Homicidal Thoughts:Homicidal Thoughts: No   Sensorium  Memory: Immediate Good; Recent Good; Remote Fair  Judgment: Fair  Insight: Fair   Chartered certified accountant: Fair  Attention Span: Fair  Recall: [employer] of Knowledge: Fair  Language: Fair   Psychomotor Activity  Psychomotor Activity: Psychomotor Activity: Normal   Assets  Assets: Communication Skills; Desire for  Improvement; Financial Resources/Insurance; Housing   Sleep  Sleep: Sleep: Fair  Estimated Sleeping Duration (Last 24 Hours): 10.75-11.25 hours  No data recorded  Physical Exam  Physical Exam ROS Blood pressure 121/71, pulse 68, temperature 98.1 F (36.7 C), temperature source Oral, resp. rate 18, SpO2 97%. There is no height or weight on file to calculate BMI.  Demographic Factors:  Male  Loss Factors: NA  Historical Factors: Family history of mental illness or substance abuse  Risk Reduction Factors:   NA  Continued Clinical Symptoms:  Dysthymia Alcohol/Substance Abuse/Dependencies More than one psychiatric diagnosis  Cognitive Features That Contribute To Risk:  None    Suicide Risk:  Minimal: No identifiable suicidal ideation.  Patients presenting with no risk factors but with morbid ruminations; may be classified as minimal risk based on the severity of the depressive symptoms  Plan Of Care/Follow-up recommendations:  The patient is able to verbalize their individual safety plan.  # It is recommended to the patient to continue psychiatric and somatic medications as prescribed, after discharge.    # It is recommended to the patient to follow up with your outpatient psychiatric provider and PCP. # It was discussed with the patient, the impact of alcohol, drugs, tobacco have been there overall psychiatric and medical wellbeing, and total abstinence from substance use was recommended.  # Prescriptions provided.  Patient agreeable to plan. Given opportunity to ask questions. Appears to feel comfortable with discharge.  #Activity: Daily physical activity; reduce sedentary behaviors.  #Diet: Portion-limited diet rich in produce, whole (minimally processed) grains, nuts/seeds, beans/legumes, seafood, eggs, fermented food, lowfat dairy (if tolerated), lean meat. Copious water intake. Limit (ultra)processed foods and sugar-sweetened beverages.  # In the event of worsening  symptoms, the patient is instructed to call your outpatient psychiatric provider, [phone] or go to the nearest  emergency department for appropriate evaluation and treatment of symptoms.  # Patient was discharged with a plan to follow up as noted previously.  Disposition: Discharge home to community-based care.  KANDI JAYSON HAHN, MD 09/13/2023, 10:16 AM

## 2023-09-13 NOTE — Care Management (Signed)
 [facility] Care Management   Writer met with the patient and discussed discharge outpatient options.  Patient declined [facility] or [facility].  Patient reports that he has a place to live.    Patient reports that he would like to try CD-IOP outpatient services.  Writer provided a list of CD-IOP providers.  Patient has Medicare, Medicaid and [employer].  Therefore, the patient will need to speak to his providers to ensure that they will pay for his CD-IOP services.  Patient reports that he is able to call the facilities and he is ready to discharge today.    Writer placed CD-IOP resources in the patient's discharge AVS.

## 2023-09-15 ENCOUNTER — Telehealth (HOSPITAL_COMMUNITY): Payer: Self-pay | Admitting: Licensed Clinical Social Worker

## 2023-09-15 ENCOUNTER — Other Ambulatory Visit (HOSPITAL_COMMUNITY): Payer: Self-pay | Admitting: Psychiatry

## 2023-09-15 ENCOUNTER — Ambulatory Visit (HOSPITAL_COMMUNITY): Admitting: Licensed Clinical Social Worker

## 2023-09-15 DIAGNOSIS — F1420 Cocaine dependence, uncomplicated: Secondary | ICD-10-CM

## 2023-09-15 DIAGNOSIS — F251 Schizoaffective disorder, depressive type: Secondary | ICD-10-CM

## 2023-09-15 MED ORDER — ATORVASTATIN CALCIUM 40 MG PO TABS: 40.0000 mg | ORAL_TABLET | Freq: Every day | ORAL | 0 refills | Status: DC

## 2023-09-15 MED ORDER — NALTREXONE HCL 50 MG PO TABS: 50.0000 mg | ORAL_TABLET | Freq: Every day | ORAL | 0 refills | Status: DC

## 2023-09-15 MED ORDER — RISPERIDONE 1 MG PO TABS: 1.0000 mg | ORAL_TABLET | Freq: Two times a day (BID) | ORAL | 0 refills | Status: DC

## 2023-09-15 MED ORDER — MIRTAZAPINE 7.5 MG PO TABS: 7.5000 mg | ORAL_TABLET | Freq: Every day | ORAL | 0 refills | Status: DC

## 2023-09-15 MED ORDER — MELATONIN 5 MG PO TABS: 5.0000 mg | ORAL_TABLET | Freq: Every day | ORAL | 0 refills | Status: DC

## 2023-09-15 MED ORDER — HYDROXYZINE HCL 25 MG PO TABS: 25.0000 mg | ORAL_TABLET | Freq: Three times a day (TID) | ORAL | 0 refills | Status: DC | PRN

## 2023-09-15 MED ORDER — PANTOPRAZOLE SODIUM 40 MG PO TBEC: 40.0000 mg | DELAYED_RELEASE_TABLET | Freq: Every day | ORAL | 0 refills | Status: DC

## 2023-09-15 MED ORDER — ESCITALOPRAM OXALATE 10 MG PO TABS
10.0000 mg | ORAL_TABLET | Freq: Every day | ORAL | 0 refills | Status: DC
Start: 2023-09-15 — End: 2023-09-23

## 2023-09-15 MED ORDER — FLUTICASONE FUROATE-VILANTEROL 100-25 MCG/ACT IN AEPB: 1.0000 | INHALATION_SPRAY | Freq: Every day | RESPIRATORY_TRACT | 0 refills | Status: DC

## 2023-09-15 NOTE — Progress Notes (Signed)
 Session Time: 2:50 p.m. to 3 p.m.  Raymond Mcclure arrives extremely late for his initial appointment as he went to the [address] location such that the therapist has only about 10 minutes to meet with him.  Raymond Mcclure says that he was discharged with none of his psychotropic medications and no med follow-up so the therapist contacts the inpatient psychiatrist to request a bridge medication and arranges for him to see a psychiatrist at [facility] and a return appointment  with this therapist.  As he is experiencing bereavement over the death of his mother, he is given the number for Hospice to inquire about bereavement support and encouraged to attend NA as well.  He says that he has some sort of home health aid who is working on getting him a PCP with the therapist informing him that once he has a PCP in the area that he could sign a ROI to have his records from his previous physician in [place] sent.  Raymond Mcclure says that he is happy to now have the after-hours Crisis number for [employer], MA, LCSW, [facility], LCAS 09/15/2023

## 2023-09-16 NOTE — Telephone Encounter (Signed)
 Note in error.

## 2023-09-22 ENCOUNTER — Other Ambulatory Visit (HOSPITAL_COMMUNITY): Payer: Self-pay

## 2023-09-22 ENCOUNTER — Ambulatory Visit (INDEPENDENT_AMBULATORY_CARE_PROVIDER_SITE_OTHER): Admitting: Licensed Clinical Social Worker

## 2023-09-22 ENCOUNTER — Encounter (HOSPITAL_COMMUNITY): Payer: Self-pay

## 2023-09-22 DIAGNOSIS — F251 Schizoaffective disorder, depressive type: Secondary | ICD-10-CM | POA: Diagnosis not present

## 2023-09-22 DIAGNOSIS — F1420 Cocaine dependence, uncomplicated: Secondary | ICD-10-CM
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
## 2023-09-23 ENCOUNTER — Other Ambulatory Visit (HOSPITAL_COMMUNITY): Payer: Self-pay | Admitting: *Deleted

## 2023-09-23 ENCOUNTER — Other Ambulatory Visit (HOSPITAL_COMMUNITY): Payer: Self-pay

## 2023-09-23 MED ORDER — ESCITALOPRAM OXALATE 10 MG PO TABS
10.0000 mg | ORAL_TABLET | Freq: Every day | ORAL | 0 refills | Status: DC
  Filled 2023-09-23 – 2023-10-12 (×2): qty 30, 30d supply, fill #0

## 2023-09-23 MED ORDER — RISPERIDONE 1 MG PO TABS
1.0000 mg | ORAL_TABLET | Freq: Two times a day (BID) | ORAL | 0 refills | Status: DC
  Filled 2023-09-23 – 2023-10-12 (×2): qty 60, 30d supply, fill #0

## 2023-09-23 MED ORDER — MIRTAZAPINE 7.5 MG PO TABS: 7.5000 mg | ORAL_TABLET | Freq: Every day | ORAL | 0 refills | Status: DC

## 2023-09-23 MED ORDER — ATORVASTATIN CALCIUM 40 MG PO TABS: 40.0000 mg | ORAL_TABLET | Freq: Every day | ORAL | 0 refills | Status: DC

## 2023-09-23 MED ORDER — HYDROXYZINE HCL 25 MG PO TABS
25.0000 mg | ORAL_TABLET | Freq: Three times a day (TID) | ORAL | 0 refills | Status: DC | PRN
  Filled 2023-09-23 – 2023-10-12 (×2): qty 30, 10d supply, fill #0

## 2023-09-23 MED ORDER — RISPERIDONE 1 MG PO TABS
1.0000 mg | ORAL_TABLET | Freq: Two times a day (BID) | ORAL | 0 refills | Status: DC
Start: 2023-09-23 — End: 2023-09-23

## 2023-09-23 MED ORDER — NALTREXONE HCL 50 MG PO TABS: 50.0000 mg | ORAL_TABLET | Freq: Every day | ORAL | 0 refills | Status: DC

## 2023-09-23 MED ORDER — ATORVASTATIN CALCIUM 40 MG PO TABS
40.0000 mg | ORAL_TABLET | Freq: Every day | ORAL | 0 refills | Status: DC
Start: 2023-09-23 — End: 2023-10-28
  Filled 2023-09-23 – 2023-10-12 (×2): qty 30, 30d supply, fill #0

## 2023-09-23 MED ORDER — ESCITALOPRAM OXALATE 10 MG PO TABS: 10.0000 mg | ORAL_TABLET | Freq: Every day | ORAL | 0 refills | Status: DC

## 2023-09-23 MED ORDER — NALTREXONE HCL 50 MG PO TABS
50.0000 mg | ORAL_TABLET | Freq: Every day | ORAL | 0 refills | Status: DC
  Filled 2023-09-23 – 2023-10-12 (×2): qty 30, 30d supply, fill #0

## 2023-09-29 ENCOUNTER — Ambulatory Visit (HOSPITAL_COMMUNITY): Admitting: Licensed Clinical Social Worker

## 2023-09-29 ENCOUNTER — Telehealth (HOSPITAL_COMMUNITY): Payer: Self-pay | Admitting: Licensed Clinical Social Worker

## 2023-09-29 NOTE — Telephone Encounter (Signed)
 The therapist attempts to reach [facility] after he no shows for his appointment leaving a HIPAA-compliant voicemail.  Zell Maier, MA, LCSW, [facility], LCAS 09/29/2026

## 2023-10-04 ENCOUNTER — Encounter (HOSPITAL_COMMUNITY): Payer: Self-pay

## 2023-10-04 ENCOUNTER — Other Ambulatory Visit: Payer: Self-pay

## 2023-10-04 ENCOUNTER — Emergency Department (HOSPITAL_COMMUNITY)

## 2023-10-04 ENCOUNTER — Other Ambulatory Visit (HOSPITAL_COMMUNITY): Payer: Self-pay

## 2023-10-04 ENCOUNTER — Emergency Department (HOSPITAL_COMMUNITY)
Admission: EM | Admit: 2023-10-04 | Discharge: 2023-10-04 | Disposition: A | Discharge status: Home / Self Care | Attending: Emergency Medicine | Admitting: Emergency Medicine

## 2023-10-04 DIAGNOSIS — R109 Unspecified abdominal pain: Secondary | ICD-10-CM

## 2023-10-04 DIAGNOSIS — K529 Noninfective gastroenteritis and colitis, unspecified: Secondary | ICD-10-CM | POA: Insufficient documentation

## 2023-10-04 DIAGNOSIS — K625 Hemorrhage of anus and rectum: Secondary | ICD-10-CM | POA: Diagnosis not present

## 2023-10-04 DIAGNOSIS — Z91010 Allergy to peanuts: Secondary | ICD-10-CM | POA: Insufficient documentation

## 2023-10-04 LAB — COMPREHENSIVE METABOLIC PANEL WITH GFR
ALT: 17 U/L (ref 0–44)
AST: 25 U/L (ref 15–41)
Albumin: 4.2 g/dL (ref 3.5–5.0)
Alkaline Phosphatase: 103 U/L (ref 38–126)
Anion gap: 13 (ref 5–15)
BUN: 14 mg/dL (ref 6–20)
CO2: 22 mmol/L (ref 22–32)
Calcium: 9.3 mg/dL (ref 8.9–10.3)
Chloride: 107 mmol/L (ref 98–111)
Creatinine, Ser: 1.02 mg/dL (ref 0.61–1.24)
GFR, Estimated: >60 mL/min (ref >60)
Glucose, Bld: 91 mg/dL (ref 70–99)
Potassium: 4.2 mmol/L (ref 3.5–5.1)
Sodium: 141 mmol/L (ref 135–145)
Total Bilirubin: 0.2 mg/dL (ref 0.0–1.2)
Total Protein: 7 g/dL (ref 6.5–8.1)

## 2023-10-04 LAB — CBC
HCT: 44.1 % (ref 39.0–52.0)
Hemoglobin: 14.4 g/dL (ref 13.0–17.0)
MCH: 30.7 pg (ref 26.0–34.0)
MCHC: 32.7 g/dL (ref 30.0–36.0)
MCV: 94 fL (ref 80.0–100.0)
Platelets: 329 K/uL (ref 150–400)
RBC: 4.69 MIL/uL (ref 4.22–5.81)
RDW: 12.8 % (ref 11.5–15.5)
WBC: 8.7 K/uL (ref 4.0–10.5)
nRBC: 0 % (ref 0.0–0.2)

## 2023-10-04 LAB — TYPE AND SCREEN
ABO/RH(D): B POS
Antibody Screen: NEGATIVE

## 2023-10-04 MED ORDER — IOHEXOL 300 MG/ML  SOLN
100.0000 mL | Freq: Once | INTRAMUSCULAR | Status: AC | PRN
  Administered 2023-10-04: 100 mL via INTRAVENOUS

## 2023-10-04 MED ORDER — HYDROCODONE-ACETAMINOPHEN 5-325 MG PO TABS
1.0000 | ORAL_TABLET | ORAL | 0 refills | Status: DC | PRN
  Filled 2023-10-04: qty 10, 2d supply, fill #0

## 2023-10-04 MED ORDER — AMOXICILLIN-POT CLAVULANATE 875-125 MG PO TABS
1.0000 | ORAL_TABLET | Freq: Once | ORAL | Status: AC
  Administered 2023-10-04: 1 via ORAL
  Filled 2023-10-04: qty 1

## 2023-10-04 MED ORDER — ONDANSETRON 4 MG PO TBDP
4.0000 mg | ORAL_TABLET | Freq: Three times a day (TID) | ORAL | 0 refills | Status: DC | PRN
Start: 2023-10-04 — End: 2023-11-21
  Filled 2023-10-04: qty 20, 7d supply, fill #0

## 2023-10-04 MED ORDER — AMOXICILLIN-POT CLAVULANATE 875-125 MG PO TABS
1.0000 | ORAL_TABLET | Freq: Two times a day (BID) | ORAL | 0 refills | Status: AC
  Filled 2023-10-04: qty 20, 10d supply, fill #0
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
## 2023-10-04 NOTE — ED Triage Notes (Signed)
 Pt presents to ED from home C/O bright red blood in stool and lower abdominal pain. Denies n/v

## 2023-10-04 NOTE — ED Provider Notes (Signed)
 Care assumed from Dr. Garrick.  At time of transfer of care, patient awaiting for results of CT on pelvis to look for concerning etiology of rectal bleeding.  Previous team suspects may be due to diverticulosis however there was reportedly minimal pain.  Anticipate disposition after CT results.   CT returned with evidence of colitis.  Went through all the findings together and will treat for possible infectious etiology with antibiotics.  Patient was able to eat and drink and was given a dose of antibiotics here.  He will be sent with prescription for Augmentin , pain is some, nausea medicine in case his symptoms return.  He agrees with plan to follow-up with outpatient PCP and GI team.  He had no other questions or concerns and agrees with plan for discharge.   Clinical Impression: 1. Rectal bleeding   2. Colitis   3. Abdominal pain, unspecified abdominal location     Disposition: Discharge  Condition: Good  I have discussed the results, Dx and Tx plan with the pt(& family if present). He/she/they expressed understanding and agree(s) with the plan. Discharge instructions discussed at great length. Strict return precautions discussed and pt &/or family have verbalized understanding of the instructions. No further questions at time of discharge.    New Prescriptions   AMOXICILLIN -CLAVULANATE (AUGMENTIN ) 875-125 MG TABLET    Take 1 tablet by mouth every 12 (twelve) hours for 10 days.   HYDROCODONE -ACETAMINOPHEN  (NORCO/VICODIN) 5-325 MG TABLET    Take 1 tablet by mouth every 4 (four) hours as needed.   ONDANSETRON  (ZOFRAN -ODT) 4 MG DISINTEGRATING TABLET    Take 1 tablet (4 mg total) by mouth every 8 (eight) hours as needed for nausea or vomiting.    Follow Up: [facility] [address] [place]  [id number] [phone] Schedule an appointment as soon as possible for a visit          Arasely Akkerman, Lonni PARAS, MD 10/04/23 JUDITHANN

## 2023-10-04 NOTE — Discharge Instructions (Addendum)
 Your history, exam and workup today seem consistent with colitis as seen on the CT scan.  This is likely causing some of the bleeding and the discomfort.  As there is a possibility of an infectious cause, we will give you prescription for antibiotics as well as some medicine for pain control and nausea control.  Please use the prescriptions and follow-up with a gastroenterologist and your primary doctor.  Please rest and stay hydrated.  If any symptoms change or worsen acutely, please return to the nearest emergency department.

## 2023-10-05 ENCOUNTER — Other Ambulatory Visit (HOSPITAL_COMMUNITY): Payer: Self-pay

## 2023-10-06 ENCOUNTER — Other Ambulatory Visit (HOSPITAL_COMMUNITY): Payer: Self-pay
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
## 2023-10-12 ENCOUNTER — Other Ambulatory Visit (HOSPITAL_COMMUNITY): Payer: Self-pay

## 2023-10-17 ENCOUNTER — Other Ambulatory Visit (HOSPITAL_COMMUNITY): Payer: Self-pay

## 2023-10-17 ENCOUNTER — Ambulatory Visit (HOSPITAL_BASED_OUTPATIENT_CLINIC_OR_DEPARTMENT_OTHER): Admitting: Psychiatry

## 2023-10-17 VITALS — BP 146/82 | Ht 70.0 in | Wt 263.8 lb

## 2023-10-17 DIAGNOSIS — F251 Schizoaffective disorder, depressive type: Secondary | ICD-10-CM

## 2023-10-17 DIAGNOSIS — G4709 Other insomnia: Secondary | ICD-10-CM

## 2023-10-17 DIAGNOSIS — F1590 Other stimulant use, unspecified, uncomplicated: Secondary | ICD-10-CM | POA: Diagnosis not present

## 2023-10-17 DIAGNOSIS — G4700 Insomnia, unspecified: Secondary | ICD-10-CM | POA: Insufficient documentation

## 2023-10-17 DIAGNOSIS — F17200 Nicotine dependence, unspecified, uncomplicated: Secondary | ICD-10-CM | POA: Insufficient documentation

## 2023-10-17 DIAGNOSIS — F1020 Alcohol dependence, uncomplicated: Secondary | ICD-10-CM

## 2023-10-17 MED ORDER — NALTREXONE HCL 50 MG PO TABS
50.0000 mg | ORAL_TABLET | Freq: Every day | ORAL | 0 refills | Status: DC
  Filled 2023-10-17 – 2023-11-06 (×2): qty 60, 60d supply, fill #0

## 2023-10-17 MED ORDER — RISPERIDONE 1 MG PO TABS
1.0000 mg | ORAL_TABLET | Freq: Two times a day (BID) | ORAL | 0 refills | Status: DC
  Filled 2023-10-17 – 2023-11-06 (×2): qty 120, 60d supply, fill #0

## 2023-10-17 MED ORDER — HYDROXYZINE HCL 25 MG PO TABS
25.0000 mg | ORAL_TABLET | Freq: Three times a day (TID) | ORAL | 0 refills | Status: DC | PRN
  Filled 2023-10-17: qty 90, 30d supply, fill #0

## 2023-10-17 MED ORDER — ESCITALOPRAM OXALATE 10 MG PO TABS
10.0000 mg | ORAL_TABLET | Freq: Every day | ORAL | 0 refills | Status: DC
Start: 2023-10-17 — End: 2023-10-28
  Filled 2023-10-17: qty 60, 60d supply, fill #0

## 2023-10-17 NOTE — Patient Instructions (Signed)
 Thank you for attending your appointment today.  -- next appointment is on 10/20 at 11 AM  Please do not make any changes to medications without first discussing with your provider. If you are experiencing a psychiatric emergency, please call 911 or present to your nearest emergency department. Additional crisis, medication management, and therapy resources are included below.  [facility]  [address] [place], [place] [address] [phone] WALK-IN URGENT CARE 24/7 FOR ANYONE [address], [place], [place]  [phone] Fax: [phone] [email or web address] *Interpreters available *Accepts all insurance and uninsured for Urgent Care needs *Accepts Medicaid and uninsured for outpatient treatment (below)      ONLY FOR [facility]  Below:    Outpatient New Patient Assessment/Therapy Walk-ins:        Monday, Wednesday, and Thursday 8am until slots are full (first come, first served)                   New Patient Psychiatry/Medication Management        Monday-Friday 8am-11am (first come, first served)               For all walk-ins we ask that you arrive by 7:15am, because patients will be seen in the order of arrival.

## 2023-10-18 ENCOUNTER — Ambulatory Visit (INDEPENDENT_AMBULATORY_CARE_PROVIDER_SITE_OTHER): Admitting: Licensed Clinical Social Worker

## 2023-10-18 DIAGNOSIS — F1590 Other stimulant use, unspecified, uncomplicated: Secondary | ICD-10-CM

## 2023-10-18 DIAGNOSIS — F251 Schizoaffective disorder, depressive type: Secondary | ICD-10-CM

## 2023-10-18 DIAGNOSIS — F1020 Alcohol dependence, uncomplicated: Secondary | ICD-10-CM

## 2023-10-18 DIAGNOSIS — F17200 Nicotine dependence, unspecified, uncomplicated: Secondary | ICD-10-CM
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
## 2023-10-19 NOTE — Addendum Note (Signed)
 Addended by: CARVIN CROCK on: 10/19/2023 12:47 PM   Modules accepted: Level of Service

## 2023-10-28 ENCOUNTER — Ambulatory Visit: Payer: Self-pay | Admitting: Student

## 2023-10-28 ENCOUNTER — Other Ambulatory Visit: Payer: Self-pay

## 2023-10-28 ENCOUNTER — Other Ambulatory Visit (HOSPITAL_COMMUNITY): Payer: Self-pay

## 2023-10-28 ENCOUNTER — Encounter: Payer: Self-pay | Admitting: Student

## 2023-10-28 VITALS — BP 142/97 | HR 87 | Ht 70.0 in | Wt 259.2 lb

## 2023-10-28 DIAGNOSIS — R7303 Prediabetes: Secondary | ICD-10-CM | POA: Diagnosis not present

## 2023-10-28 DIAGNOSIS — G4733 Obstructive sleep apnea (adult) (pediatric): Secondary | ICD-10-CM | POA: Insufficient documentation

## 2023-10-28 DIAGNOSIS — J4520 Mild intermittent asthma, uncomplicated: Secondary | ICD-10-CM

## 2023-10-28 DIAGNOSIS — E119 Type 2 diabetes mellitus without complications: Secondary | ICD-10-CM

## 2023-10-28 DIAGNOSIS — I2510 Atherosclerotic heart disease of native coronary artery without angina pectoris: Secondary | ICD-10-CM

## 2023-10-28 DIAGNOSIS — K219 Gastro-esophageal reflux disease without esophagitis: Secondary | ICD-10-CM | POA: Insufficient documentation

## 2023-10-28 DIAGNOSIS — Z1211 Encounter for screening for malignant neoplasm of colon: Secondary | ICD-10-CM

## 2023-10-28 DIAGNOSIS — I10 Essential (primary) hypertension: Secondary | ICD-10-CM | POA: Diagnosis not present

## 2023-10-28 DIAGNOSIS — F251 Schizoaffective disorder, depressive type: Secondary | ICD-10-CM

## 2023-10-28 DIAGNOSIS — Z9889 Other specified postprocedural states: Secondary | ICD-10-CM

## 2023-10-28 DIAGNOSIS — F17200 Nicotine dependence, unspecified, uncomplicated: Secondary | ICD-10-CM

## 2023-10-28 DIAGNOSIS — Z5971 Insufficient health insurance coverage: Secondary | ICD-10-CM

## 2023-10-28 DIAGNOSIS — R918 Other nonspecific abnormal finding of lung field: Secondary | ICD-10-CM

## 2023-10-28 DIAGNOSIS — I6930 Unspecified sequelae of cerebral infarction: Secondary | ICD-10-CM | POA: Diagnosis not present

## 2023-10-28 DIAGNOSIS — E785 Hyperlipidemia, unspecified: Secondary | ICD-10-CM

## 2023-10-28 DIAGNOSIS — J45909 Unspecified asthma, uncomplicated: Secondary | ICD-10-CM | POA: Insufficient documentation

## 2023-10-28 DIAGNOSIS — R413 Other amnesia: Secondary | ICD-10-CM

## 2023-10-28 DIAGNOSIS — Z1159 Encounter for screening for other viral diseases: Secondary | ICD-10-CM

## 2023-10-28 LAB — POCT GLYCOSYLATED HEMOGLOBIN (HGB A1C): HbA1c, POC (prediabetic range): 5.6 % — AB (ref 5.7–6.4)

## 2023-10-28 LAB — GLUCOSE, CAPILLARY: Glucose-Capillary: 87 mg/dL (ref 70–99)

## 2023-10-28 MED ORDER — HYDROXYZINE HCL 25 MG PO TABS
25.0000 mg | ORAL_TABLET | Freq: Three times a day (TID) | ORAL | 2 refills | Status: DC | PRN
  Filled 2023-10-28: qty 90, 30d supply, fill #0

## 2023-10-28 MED ORDER — VARENICLINE TARTRATE (STARTER) 0.5 MG X 11 & 1 MG X 42 PO TBPK
1.0000 mg | ORAL_TABLET | Freq: Two times a day (BID) | ORAL | 1 refills | Status: DC
  Filled 2023-10-28: qty 53, 30d supply, fill #0

## 2023-10-28 MED ORDER — PANTOPRAZOLE SODIUM 40 MG PO TBEC: 40.0000 mg | DELAYED_RELEASE_TABLET | Freq: Every day | ORAL | 11 refills | Status: DC

## 2023-10-28 MED ORDER — BLOOD PRESSURE CUFF MISC: 1.0000 | Freq: Every day | 0 refills | Status: AC

## 2023-10-28 MED ORDER — ATORVASTATIN CALCIUM 40 MG PO TABS
40.0000 mg | ORAL_TABLET | Freq: Every day | ORAL | 11 refills | Status: DC
  Filled 2023-10-28 – 2023-11-06 (×2): qty 30, 30d supply, fill #0

## 2023-10-28 MED ORDER — ESCITALOPRAM OXALATE 10 MG PO TABS
10.0000 mg | ORAL_TABLET | Freq: Every day | ORAL | 11 refills | Status: DC
  Filled 2023-10-28 – 2023-11-06 (×2): qty 60, 60d supply, fill #0

## 2023-10-28 NOTE — Assessment & Plan Note (Signed)
 Past medical history of GERD. Previously on Protonix  40 mg daily. Stating that he is having more acid reflux. Will reinitiate.   Plan: - Start back Protonix  40 mg daily

## 2023-10-28 NOTE — Patient Instructions (Addendum)
 Raymond Mcclure,Thank you for allowing me to take part in your care today.  Here are your instructions.  1.  We have given you a blood pressure log.  Please fill it out and bring at your next visit.  Come back in 1 month.  2.  Regarding your acid reflux, we are going to start Protonix .  Come back in 1 month and we will see how your abdominal symptoms are doing.  3.  I have refilled your medications  4.  I have referred you for a colonoscopy.  I referred you for sleep study.  I have referred you for lung function test  5.  I referred you for cardiologist  6.  Please come back in 1 month and we will see how you are doing.  7.  We are screening for hepatitis C today.   PLEASE BRING YOUR MEDICATIONS TO EVERY APPOINTMENT  Thank you, Dr. Tobie  If you have any other questions please contact the internal medicine clinic at [phone] If it is after hours, please call the [place] [facility] at [phone] and then ask the person who picks up for the resident on call.

## 2023-10-28 NOTE — Assessment & Plan Note (Signed)
 Patient has a past medical history of CAD.  He also has a history of atrial fibrillation status post ablation.  Has been lost to follow-up with cardiology.  Plan: - Refer back to cardiology

## 2023-10-28 NOTE — Assessment & Plan Note (Signed)
 Patient past medical history of obstructive sleep apnea.  Wants to get back on CPAP therapy.  Will need updated sleep study.  Plan: - Patient referred for sleep study

## 2023-10-28 NOTE — Assessment & Plan Note (Signed)
 Past medical history of tobacco use disorder.  He is ready quit smoking.  Plan: - Prescribed Chantix 

## 2023-10-28 NOTE — Assessment & Plan Note (Signed)
 Past medical history of hypertension.  Used to be on medications.  No longer on any medication at this time.  Blood pressure elevated today into the 140s systolics.  Will ask for ambulatory blood pressure log prior to initiating medications.  Plan: - Maintain blood pressure log - Follow-up in 1 month

## 2023-10-28 NOTE — Assessment & Plan Note (Addendum)
 Patient recently started taking atorvastatin  40 mg daily.  Too soon to check lipids.  Plan: - Check lipids in about 6 months.

## 2023-10-28 NOTE — Assessment & Plan Note (Signed)
 History of pulmonary nodules.  Most recent CT scan of chest/abdomen/pelvis with IV contrast on 05/28/2023 showing no acute concern for nodules

## 2023-10-28 NOTE — Progress Notes (Signed)
 CC: Chronic condition follow-up and physical  HPI:  [name] is a 58 y.o. male with a past medical history of CAD, hypertension, alcohol use disorder, cocaine use, dyslipidemia who presents for follow-up for chronic condition follow-up.  Please see assessment and plan for full HPI.  Medications: Alcohol use disorder: Naltrexone  50 mg daily Schizoaffective disorder: Lexapro  10 mg daily, hydroxyzine  25 mg 3 times daily as needed, risperidone  1 mg twice daily  Hyperlipidemia: Lipitor 40 mg nightly Insomnia: Melatonin 5 mg nightly Nausea: Zofran  4 mg every 8 hours as needed GERD: Protonix  40 mg daily  Past Medical History:  Diagnosis Date   Cluster headaches    Drug abuse (HCC)    Hypertension    Migraines    Obesity    Tachycardia      Current Outpatient Medications:    Blood Pressure Monitoring (BLOOD PRESSURE CUFF) MISC, 1 kit by Does not apply route daily., Disp: 1 each, Rfl: 0   Varenicline  Tartrate, Starter, (CHANTIX  STARTING MONTH PAK) 0.5 MG X 11 & 1 MG X 42 TBPK, Take 1 mg by mouth 2 (two) times daily., Disp: 53 each, Rfl: 1   atorvastatin  (LIPITOR) 40 MG tablet, Take 1 tablet (40 mg total) by mouth at bedtime., Disp: 30 tablet, Rfl: 11   escitalopram  (LEXAPRO ) 10 MG tablet, Take 1 tablet (10 mg total) by mouth daily., Disp: 60 tablet, Rfl: 11   fluticasone  furoate-vilanterol (BREO ELLIPTA ) 100-25 MCG/ACT AEPB, Inhale 1 puff into the lungs daily., Disp: 1 each, Rfl: 0   hydrOXYzine  (ATARAX ) 25 MG tablet, Take 1 tablet (25 mg total) by mouth 3 (three) times daily as needed for anxiety., Disp: 90 tablet, Rfl: 2   naltrexone  (DEPADE) 50 MG tablet, Take 1 tablet (50 mg total) by mouth daily., Disp: 60 tablet, Rfl: 0   ondansetron  (ZOFRAN -ODT) 4 MG disintegrating tablet, Take 1 tablet (4 mg total) by mouth every 8 (eight) hours as needed for nausea or vomiting., Disp: 20 tablet, Rfl: 0   pantoprazole  (PROTONIX ) 40 MG tablet, Take 1 tablet (40 mg total) by mouth daily.,  Disp: 30 tablet, Rfl: 11   risperiDONE  (RISPERDAL ) 1 MG tablet, Take 1 tablet (1 mg total) by mouth 2 (two) times daily., Disp: 120 tablet, Rfl: 0  Review of Systems:    Negative except for what is stated in HPI  Physical Exam:  Vitals:   10/28/23 1036 10/28/23 1045  BP: (!) 144/96 (!) 142/97  Pulse: 87 87  SpO2: 98%   Weight: 259 lb 3.2 oz (117.6 kg)   Height: 5' 10 (1.778 m)    General: Patient is sitting comfortably in the room  Head: Normocephalic, atraumatic  Cardio: Regular rate and rhythm, no murmurs, rubs or gallops Pulmonary: Clear to ausculation bilaterally with no rales, rhonchi, and crackles  Abdomen: Soft, nontender with normoactive bowel sounds with no rebound or guarding    Assessment & Plan:   Assessment & Plan Pre-diabetes Patient has a past medical history of prediabetes.  A1c 7 months ago was 5.7.  Obtain A1c today.  A1c 5.3.  He is no longer prediabetic.  Plan: - Continue lifestyle changes Need for hepatitis C screening test Obtaining hepatitis C screening today.  Plan: - Follow-up hepatitis C screening Colon cancer screening Patient request to be screened for colon cancer today.  Plan: - Referred to gastroenterology for colonoscopy for colon cancer screening Mild intermittent reactive airway disease without complication Patient with past medical history of reactive airway disease.  He  has been on Breo.  Unclear which reactive airway disease assist.  Unclear if this is emphysema, COPD, asthma.  He has had no recent flares.  Will obtain PFTs to further characterize.  Plan: - Obtain PFTs OSA (obstructive sleep apnea) Patient past medical history of obstructive sleep apnea.  Wants to get back on CPAP therapy.  Will need updated sleep study.  Plan: - Patient referred for sleep study Mild CAD Patient has a past medical history of CAD.  He also has a history of atrial fibrillation status post ablation.  Has been lost to follow-up with  cardiology.  Plan: - Refer back to cardiology Schizoaffective disorder, depressive type [facility]) Patient has a past medical history of schizoaffective disorder.  He was recently hospitalized in the behavioral health urgent care for mental health crisis.  He states that his mother died in 04/07/23, and he started missing medications, and had a crisis.  He is now better.  He is being followed by home health.  He is doing well and has no concerns at this time.  He continues to see psychiatry.  Plan: - Continue risperidone  per psychiatry - Continue Lexapro  per psychiatry - Continue Hydroxyzine   Tobacco use disorder Past medical history of tobacco use disorder.  He is ready quit smoking.  Plan: - Prescribed Chantix  History of radiofrequency ablation procedure for cardiac arrhythmia Patient has a history of cardiac arrhythmia.  He is status post ablation.  He has been lost to follow-up with his cardiologist.  Plan: Refer back to cardiology Primary hypertension Past medical history of hypertension.  Used to be on medications.  No longer on any medication at this time.  Blood pressure elevated today into the 140s systolics.  Will ask for ambulatory blood pressure log prior to initiating medications.  Plan: - Maintain blood pressure log - Follow-up in 1 month Pulmonary nodules History of pulmonary nodules.  Most recent CT scan of chest/abdomen/pelvis with IV contrast on 05/28/2023 showing no acute concern for nodules Dyslipidemia Patient recently started taking atorvastatin  40 mg daily.  Too soon to check lipids.  Plan: - Check lipids in about 6 months. Gastroesophageal reflux disease, unspecified whether esophagitis present Past medical history of GERD. Previously on Protonix  40 mg daily. Stating that he is having more acid reflux. Will reinitiate.   Plan: - Start back Protonix  40 mg daily     Patient discussed with Dr. Jeanelle Libby Blanch, DO Internal Medicine Resident PGY-3

## 2023-10-28 NOTE — Assessment & Plan Note (Signed)
 Patient with past medical history of reactive airway disease.  He has been on Breo.  Unclear which reactive airway disease assist.  Unclear if this is emphysema, COPD, asthma.  He has had no recent flares.  Will obtain PFTs to further characterize.  Plan: - Obtain PFTs

## 2023-10-28 NOTE — Assessment & Plan Note (Signed)
 Patient has a past medical history of schizoaffective disorder.  He was recently hospitalized in the behavioral health urgent care for mental health crisis.  He states that his mother died in 23-Apr-2023, and he started missing medications, and had a crisis.  He is now better.  He is being followed by home health.  He is doing well and has no concerns at this time.  He continues to see psychiatry.  Plan: - Continue risperidone  per psychiatry - Continue Lexapro  per psychiatry - Continue Hydroxyzine 

## 2023-10-28 NOTE — Assessment & Plan Note (Signed)
 Patient has a history of cardiac arrhythmia.  He is status post ablation.  He has been lost to follow-up with his cardiologist.  Plan: Refer back to cardiology

## 2023-10-29 ENCOUNTER — Other Ambulatory Visit (HOSPITAL_COMMUNITY): Payer: Self-pay

## 2023-10-29 LAB — HCV AB W REFLEX TO QUANT PCR: HCV Ab: NONREACTIVE

## 2023-10-29 LAB — HCV INTERPRETATION

## 2023-10-29 NOTE — Progress Notes (Signed)
 Internal Medicine Clinic Attending  Case discussed with the resident at the time of the visit.  We reviewed the resident's history and exam and pertinent patient test results.  I agree with the assessment, diagnosis, and plan of care documented in the resident's note.

## 2023-10-31 ENCOUNTER — Other Ambulatory Visit: Payer: Self-pay

## 2023-10-31 ENCOUNTER — Ambulatory Visit: Payer: Self-pay | Admitting: Student

## 2023-10-31 DIAGNOSIS — J45909 Unspecified asthma, uncomplicated: Secondary | ICD-10-CM

## 2023-11-04 ENCOUNTER — Ambulatory Visit (HOSPITAL_COMMUNITY)
Admission: RE | Admit: 2023-11-04 | Discharge: 2023-11-04 | Disposition: A | Discharge status: Home / Self Care | Source: Ambulatory Visit | Attending: Family Medicine | Admitting: Family Medicine

## 2023-11-04 ENCOUNTER — Other Ambulatory Visit (HOSPITAL_COMMUNITY): Payer: Self-pay

## 2023-11-04 DIAGNOSIS — J4520 Mild intermittent asthma, uncomplicated: Secondary | ICD-10-CM | POA: Diagnosis present

## 2023-11-04 LAB — PULMONARY FUNCTION TEST
DL/VA % pred: 72 %
DL/VA: 3.13 ml/min/mmHg/L
DLCO unc % pred: 60 %
DLCO unc: 17.22 ml/min/mmHg
FEF 25-75 Post: 4.15 L/s
FEF 25-75 Pre: 3.41 L/s
FEF2575-%Change-Post: 21 %
FEF2575-%Pred-Post: 130 %
FEF2575-%Pred-Pre: 107 %
FEV1-%Change-Post: 5 %
FEV1-%Pred-Post: 87 %
FEV1-%Pred-Pre: 82 %
FEV1-Post: 3.28 L
FEV1-Pre: 3.09 L
FEV1FVC-%Change-Post: 1 %
FEV1FVC-%Pred-Pre: 108 %
FEV6-%Change-Post: 4 %
FEV6-%Pred-Post: 82 %
FEV6-%Pred-Pre: 79 %
FEV6-Post: 3.88 L
FEV6-Pre: 3.72 L
FEV6FVC-%Change-Post: 0 %
FEV6FVC-%Pred-Post: 104 %
FEV6FVC-%Pred-Pre: 103 %
FVC-%Change-Post: 4 %
FVC-%Pred-Post: 79 %
FVC-%Pred-Pre: 76 %
FVC-Post: 3.88 L
FVC-Pre: 3.73 L
Post FEV1/FVC ratio: 84 %
Post FEV6/FVC ratio: 100 %
Pre FEV1/FVC ratio: 83 %
Pre FEV6/FVC Ratio: 100 %
RV % pred: 64 %
RV: 1.39 L
TLC % pred: 76 %
TLC: 5.31 L

## 2023-11-04 MED ORDER — ALBUTEROL SULFATE (2.5 MG/3ML) 0.083% IN NEBU
2.5000 mg | INHALATION_SOLUTION | Freq: Once | RESPIRATORY_TRACT | Status: AC
  Administered 2023-11-04: 2.5 mg via RESPIRATORY_TRACT

## 2023-11-06 ENCOUNTER — Other Ambulatory Visit (HOSPITAL_COMMUNITY): Payer: Self-pay

## 2023-11-07 ENCOUNTER — Other Ambulatory Visit: Payer: Self-pay

## 2023-11-10 ENCOUNTER — Emergency Department (HOSPITAL_COMMUNITY)
Admission: EM | Admit: 2023-11-10 | Discharge: 2023-11-10 | Discharge status: Against Medical Advice | Attending: Emergency Medicine | Admitting: Emergency Medicine

## 2023-11-10 ENCOUNTER — Ambulatory Visit (INDEPENDENT_AMBULATORY_CARE_PROVIDER_SITE_OTHER): Admitting: Licensed Clinical Social Worker

## 2023-11-10 ENCOUNTER — Other Ambulatory Visit: Payer: Self-pay | Admitting: Student

## 2023-11-10 ENCOUNTER — Other Ambulatory Visit: Payer: Self-pay

## 2023-11-10 DIAGNOSIS — R1031 Right lower quadrant pain: Secondary | ICD-10-CM | POA: Insufficient documentation

## 2023-11-10 DIAGNOSIS — F251 Schizoaffective disorder, depressive type: Secondary | ICD-10-CM

## 2023-11-10 DIAGNOSIS — F17200 Nicotine dependence, unspecified, uncomplicated: Secondary | ICD-10-CM

## 2023-11-10 DIAGNOSIS — F1590 Other stimulant use, unspecified, uncomplicated: Secondary | ICD-10-CM

## 2023-11-10 DIAGNOSIS — R1032 Left lower quadrant pain: Secondary | ICD-10-CM | POA: Diagnosis not present

## 2023-11-10 DIAGNOSIS — Z5321 Procedure and treatment not carried out due to patient leaving prior to being seen by health care provider: Secondary | ICD-10-CM | POA: Insufficient documentation

## 2023-11-10 DIAGNOSIS — G4733 Obstructive sleep apnea (adult) (pediatric): Secondary | ICD-10-CM

## 2023-11-10 DIAGNOSIS — F1020 Alcohol dependence, uncomplicated: Secondary | ICD-10-CM

## 2023-11-10 NOTE — Progress Notes (Signed)
 Sleep study referral incorrectly sent to sleep medicine and referral resent today to pulmonology.

## 2023-11-10 NOTE — Progress Notes (Signed)
 THERAPIST PROGRESS NOTE  Session Time: 11:11 a.m. to 12:06 p.m.   Type of Therapy: Individual   Therapist Response/Interventions: Solution Focused/the therapist suggests that Tab needs to let the Child Support Court know that he is now on disability and make him aware of student loan forgiveness for persons on disability sending him the link to the on-line form to complete.  The therapist observes that Raymond Mcclure was going to set limits with his daughter weeks ago and encourages him to speak with her about his concerns. The therapist praises Raymond Mcclure for getting some many medical concerns addressed and getting back on his medication as well as abstaining from drugs and alcohol and encourages he continue doing what he has been doing in this regard.    Treatment Goals addressed:  Active     Substance Use     Raymond Mcclure will abstain from drugs and alcohol per self-report and random UDS or breathalyzer as indicated. (Progressing)     Start:  09/22/23    Expected End:  04/16/24         Raymond Mcclure will experience a decrease in his feelings of depression and anxiety as evidenced by having a PHQ-9 and GAD-7 of a 4 or less, no longer hearing voices, no recurrence of suicidal ideation, and normalization of his sleep. (Progressing)     Start:  09/22/23    Expected End:  04/16/24         The therapist will assist Raymond Mcclure in being able to identify and avoid triggers for using drugs or alcohol.     Start:  09/22/23         The therapist will assist Raymond Mcclure in being able to identify and change thoughts and behaviors that contribute to his feelings of depression and anxiety and will encourage him to be compliant with his medication on a daily basis.     Start:  09/22/23                  Summary: Raymond Mcclure says that he now has a PCP. He has an appointment on the 22nd of this month for the sleep doctor. He says that he is still waiting on a Cardiologist to get in touch with him. He is currently keeping  recordings of his blood pressure for his doctor with his top number being around 165 and the top number around 111.   Raymond Mcclure says that he wants to go to Urgent Care today wondering if he has hernias again. He eventually says that he will go to [facility] ER after this visit.  He says that he is only sleeping 3-4 hours per night. He is not hearing any voices. He says that his mood is still up and down.  Raymond Mcclure rates his depression and anxiety both as a 2 or 3 currently with 1 being good. He says that when he feels like it's going to be a bad day that he will try and avoid people as much as possible.   Raymond Mcclure says that he has not used drugs or alcohol since he left inpatient around 09/13/23. He says that he thought about using the other day when his grandchildren had him overwhelmed.  He says that his ex in [place]  put him back on child support and he is 6k behind noting that the [facility] was trying to find him to serve him. In discussing how being disabled could impact his child support, the therapist mentions the possibility of student loan forgiveness with Raymond Mcclure saying that he needs to  look into this as he has 36K in student loans.   Raymond Mcclure says that he went to the eye doctor on Saturday and is going to pick up his new glasses so will be able to see better. He says that he needs to talk to his oldest daughter to let her know that he is not there to babysit for her which he says may be part of the reason he is not sleeping much.  By the conclusion of the session, he says that he will have a talk with his daughter. He is working to get the basement at his mother's house fixed such that he can stay down there. He says that the upstairs is too noisy and admits that he believes that the house has a ghost living in the addict.    Progress Towards Goals:   Active     Substance Use     Raymond Mcclure will abstain from drugs and alcohol per self-report and random UDS or breathalyzer as indicated. (Progressing)      Start:  09/22/23    Expected End:  04/16/24         Raymond Mcclure will experience a decrease in his feelings of depression and anxiety as evidenced by having a PHQ-9 and GAD-7 of a 4 or less, no longer hearing voices, no recurrence of suicidal ideation, and normalization of his sleep. (Progressing)     Start:  09/22/23    Expected End:  04/16/24         The therapist will assist Raymond Mcclure in being able to identify and avoid triggers for using drugs or alcohol.     Start:  09/22/23         The therapist will assist Raymond Mcclure in being able to identify and change thoughts and behaviors that contribute to his feelings of depression and anxiety and will encourage him to be compliant with his medication on a daily basis.     Start:  09/22/23            Suicidal/Homicidal: No SI or HI  Plan: At the conclusion of the session, Raymond Mcclure says that he would like to return on an as needed basis.   Diagnosis: Schizoaffective disorder, depressive type and cocaine use disorder, severe  Collaboration of Care: Other the therapist contacts of the unit nurse for assistance concerning locating his prescription.  Patient/Guardian was advised Release of Information must be obtained prior to any record release in order to collaborate their care with an outside provider. Patient/Guardian was advised if they have not already done so to contact the registration department to sign all necessary forms in order for us  to release information regarding their care.   Consent: Patient/Guardian gives verbal consent for treatment and assignment of benefits for services provided during this visit. Patient/Guardian expressed understanding and agreed to proceed.   Zell Maier, MA, LCSW, [facility], LCAS 11/10/2023

## 2023-11-10 NOTE — ED Notes (Signed)
 Pt called and pt states he left ER
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
## 2023-11-14 NOTE — Progress Notes (Unsigned)
 This encounter was created in error - please disregard.

## 2023-11-16 ENCOUNTER — Encounter: Payer: Self-pay | Admitting: Physician Assistant

## 2023-11-16 ENCOUNTER — Ambulatory Visit: Admitting: Adult Health

## 2023-11-16 ENCOUNTER — Encounter: Payer: Self-pay | Admitting: Adult Health

## 2023-11-16 VITALS — BP 128/80 | HR 75 | Temp 97.9°F | Ht 70.0 in | Wt 259.0 lb

## 2023-11-16 DIAGNOSIS — F17200 Nicotine dependence, unspecified, uncomplicated: Secondary | ICD-10-CM | POA: Diagnosis not present

## 2023-11-16 DIAGNOSIS — F1290 Cannabis use, unspecified, uncomplicated: Secondary | ICD-10-CM

## 2023-11-16 DIAGNOSIS — E669 Obesity, unspecified: Secondary | ICD-10-CM

## 2023-11-16 DIAGNOSIS — J4530 Mild persistent asthma, uncomplicated: Secondary | ICD-10-CM

## 2023-11-16 DIAGNOSIS — G4709 Other insomnia: Secondary | ICD-10-CM | POA: Diagnosis not present

## 2023-11-16 DIAGNOSIS — Z6837 Body mass index (BMI) 37.0-37.9, adult: Secondary | ICD-10-CM

## 2023-11-16 DIAGNOSIS — G4733 Obstructive sleep apnea (adult) (pediatric): Secondary | ICD-10-CM | POA: Diagnosis not present

## 2023-11-16 DIAGNOSIS — F1490 Cocaine use, unspecified, uncomplicated: Secondary | ICD-10-CM

## 2023-11-16 NOTE — Progress Notes (Signed)
 @Patient  ID: Raymond Mcclure, male    DOB: 1965/08/16, 58 y.o.   MRN: [id number]  Chief Complaint  Patient presents with   Consult    Referring provider: Jeanelle Layman CROME, *  HPI: 58 yo male seen for sleep consult 11/16/23 for sleep apnea Medical history significant for hypertension, polysubstance abuse, schizoaffective disorder, asthma  Previously Diagnosed with Sleep apnea, on CPAP briefly    TEST/EVENTS :  Discussed the use of AI scribe software for clinical note transcription with the patient, who gave verbal consent to proceed.  History of Present Illness Raymond Mcclure is a 58 year old male with sleep apnea who presents for a sleep consult to evaluate ongoing symptoms.  He has a history of sleep apnea and previously used a CPAP machine, which improved his energy levels, but he discontinued its use after a few months. Currently, he experiences snoring, restless sleep, waking up feeling tired, and daytime sleepiness, often dozing off for a few minutes during the day. He has not been using any specific sleep aids recently, although he was previously on trazodone .  Typically goes to bed about 11 PM.  Gets up at 6 AM.  Is up several times throughout the night.  Weight is up 30 pounds current weight is at 259 pounds with a BMI of 37.  Patient says he has to nap frequently typically doze off for several minutes throughout the day.  Patient says he does not drive because he gets sleepy with driving  He has a history of high blood pressure, high cholesterol, and underwent an ablation for supraventricular tachycardia (SVT) in 2023. He denies any history of congestive heart failure or stroke.   His social history includes smoking since the age of 89, with a current use of about half a pack a day, although he is attempting to quit using Chantix . He has a history of alcohol abuse but reports not drinking since February or March. He also reports current use of marijuana and cocaine. He lives  with his daughter and is on disability    He is recently been started on Breo inhaler for possible asthma or COPD.  He has an upcoming high-resolution CT chest.  Pulmonary function testing recently showed no significant airflow obstruction.  Mild restriction and a decreased diffusing capacity.  Does have some intermittent cough and wheezing.  Gets short of breath with activities.      11/16/2023    8:00 AM  Results of the Epworth flowsheet  Sitting and reading 3  Watching TV 3  Sitting, inactive in a public place (e.g. a theatre or a meeting) 3  As a passenger in a car for an hour without a break 3  Lying down to rest in the afternoon when circumstances permit 3  Sitting and talking to someone 2  Sitting quietly after a lunch without alcohol 2  In a car, while stopped for a few minutes in traffic 3  Total score 22   Past Surgical History:  Procedure Laterality Date   CARDIAC CATHETERIZATION     LOOP RECORDER IMPLANT     WRIST SURGERY     nerve repair     Allergies  Allergen Reactions   Bee Venom Anaphylaxis   Peanut-Containing Drug Products Anaphylaxis and Swelling   Covid-19 (Mrna) Vaccine Other (See Comments)    Blacked out after 1st vaccine; after 2nd vaccine, syncope again    Immunization History  Administered Date(s) Administered   Hep A / Hep B  03/18/2009, 02/23/2011, 11/02/2011   PFIZER(Purple Top)SARS-COV-2 Vaccination 05/25/2019, 06/18/2019   Tdap 03/18/2009, 02/06/2019    Past Medical History:  Diagnosis Date   Cluster headaches    Drug abuse (HCC)    Hypertension    Migraines    Obesity    Tachycardia     Tobacco History: Social History   Tobacco Use  Smoking Status Some Days   Types: Cigars  Smokeless Tobacco Never  Tobacco Comments   Couple of days .  Smokes when stressed.   Ready to quit: Not Answered Counseling given: Not Answered Tobacco comments: Couple of days .  Smokes when stressed.   Outpatient Medications Prior to Visit   Medication Sig Dispense Refill   Blood Pressure Monitoring (BLOOD PRESSURE CUFF) MISC 1 kit by Does not apply route daily. 1 each 0   fluticasone  furoate-vilanterol (BREO ELLIPTA ) 100-25 MCG/ACT AEPB Inhale 1 puff into the lungs daily. 1 each 0   hydrOXYzine  (ATARAX ) 25 MG tablet Take 1 tablet (25 mg total) by mouth 3 (three) times daily as needed for anxiety. 90 tablet 2   naltrexone  (DEPADE) 50 MG tablet Take 1 tablet (50 mg total) by mouth daily. 60 tablet 0   ondansetron  (ZOFRAN -ODT) 4 MG disintegrating tablet Take 1 tablet (4 mg total) by mouth every 8 (eight) hours as needed for nausea or vomiting. 20 tablet 0   risperiDONE  (RISPERDAL ) 1 MG tablet Take 1 tablet (1 mg total) by mouth 2 (two) times daily. 120 tablet 0   Umeclidinium-Vilanterol (ANORO ELLIPTA  IN) Inhale into the lungs.     Varenicline  Tartrate, Starter, (CHANTIX  STARTING MONTH PAK) 0.5 MG X 11 & 1 MG X 42 TBPK Take 1 mg by mouth 2 (two) times daily. 53 each 1   atorvastatin  (LIPITOR) 40 MG tablet Take 1 tablet (40 mg total) by mouth at bedtime. (Patient not taking: Reported on 11/16/2023) 30 tablet 11   escitalopram  (LEXAPRO ) 10 MG tablet Take 1 tablet (10 mg total) by mouth daily. (Patient not taking: Reported on 11/16/2023) 60 tablet 11   pantoprazole  (PROTONIX ) 40 MG tablet Take 1 tablet (40 mg total) by mouth daily. (Patient not taking: Reported on 11/16/2023) 30 tablet 11   No facility-administered medications prior to visit.     Review of Systems:   Constitutional:   No  weight loss, night sweats,  Fevers, chills, +fatigue, or  lassitude.  HEENT:   No headaches,  Difficulty swallowing,  Tooth/dental problems, or  Sore throat,                No sneezing, itching, ear ache, nasal congestion, post nasal drip,   CV:  No chest pain,  Orthopnea, PND, swelling in lower extremities, anasarca, dizziness, palpitations, syncope.   GI  No heartburn, indigestion, abdominal pain, nausea, vomiting, diarrhea, change in bowel  habits, loss of appetite, bloody stools.   Resp:  No chest wall deformity  Skin: no rash or lesions.  GU: no dysuria, change in color of urine, no urgency or frequency.  No flank pain, no hematuria   MS:  No joint pain or swelling.  No decreased range of motion.  No back pain.    Physical Exam  BP 128/80 (BP Location: Left Arm, Patient Position: Sitting)   Pulse 75   Temp 97.9 F (36.6 C) (Oral)   Ht 5' 10 (1.778 m)   Wt 259 lb (117.5 kg)   SpO2 99%   BMI 37.16 kg/m   GEN: A/Ox3; pleasant , NAD, well  nourished    HEENT:  [place]/AT,   NOSE-clear, THROAT-clear, no lesions, no postnasal drip or exudate noted. Class 3 MP airway, elongated uvula.   NECK:  Supple w/ fair ROM; no JVD; normal carotid impulses w/o bruits; no thyromegaly or nodules palpated; no lymphadenopathy.    RESP  Clear  P & A; w/o, wheezes/ rales/ or rhonchi. no accessory muscle use, no dullness to percussion  CARD:  RRR, no m/r/g, no peripheral edema, pulses intact, no cyanosis or clubbing.  GI:   Soft & nt; nml bowel sounds; no organomegaly or masses detected.   Musco: Warm bil, no deformities or joint swelling noted.   Neuro: alert, no focal deficits noted.    Skin: Warm, no lesions or rashes    Lab Results:  CBC    Component Value Date/Time   WBC 8.7 10/04/2023 1017   RBC 4.69 10/04/2023 1017   HGB 14.4 10/04/2023 1017   HGB 14.6 09/14/2021 0914   HCT 44.1 10/04/2023 1017   HCT 43.3 09/14/2021 0914   PLT 329 10/04/2023 1017   PLT 350 09/14/2021 0914   MCV 94.0 10/04/2023 1017   MCV 94 09/14/2021 0914   MCH 30.7 10/04/2023 1017   MCHC 32.7 10/04/2023 1017   RDW 12.8 10/04/2023 1017   RDW 12.3 09/14/2021 0914   LYMPHSABS 2.9 03/03/2023 1640   MONOABS 1.0 03/03/2023 1640   EOSABS 0.1 03/03/2023 1640   BASOSABS 0.0 03/03/2023 1640    BMET    Component Value Date/Time   NA 141 10/04/2023 1017   NA 139 09/14/2021 0914   K 4.2 10/04/2023 1017   CL 107 10/04/2023 1017   CO2 22  10/04/2023 1017   GLUCOSE 91 10/04/2023 1017   BUN 14 10/04/2023 1017   BUN 13 09/14/2021 0914   CREATININE 1.02 10/04/2023 1017   CALCIUM  9.3 10/04/2023 1017   GFRNONAA >60 10/04/2023 1017   GFRAA >60 08/23/2014 0858    BNP    Component Value Date/Time   BNP 26.5 01/05/2021 1603    ProBNP    Component Value Date/Time   PROBNP <30.0 07/14/2006 0415    Imaging: No results found.  Administration History     None          Latest Ref Rng & Units 11/04/2023    8:43 AM  PFT Results  FVC-Pre L 3.73   FVC-Predicted Pre % 76   FVC-Post L 3.88   FVC-Predicted Post % 79   Pre FEV1/FVC % % 83   Post FEV1/FCV % % 84   FEV1-Pre L 3.09   FEV1-Predicted Pre % 82   FEV1-Post L 3.28   DLCO uncorrected ml/min/mmHg 17.22   DLCO UNC% % 60   DLVA Predicted % 72   TLC L 5.31   TLC % Predicted % 76   RV % Predicted % 64     No results found for: NITRICOXIDE      Assessment & Plan:   Assessment and Plan Assessment & Plan Obstructive sleep apnea   He experiences symptoms of snoring, restless sleep, daytime fatigue, and sleepiness suspicious for ongoing obstructive sleep apnea. Previous CPAP use was perceived as beneficial.  Patient says he has tried to do a home sleep study before but was unsuccessful.  Order a split night sleep study to evaluate for obstructive sleep apnea Discuss the potential impact of untreated sleep apnea on heart and brain due to oxygen deprivation. - discussed how weight can impact sleep and risk for sleep  disordered breathing - discussed options to assist with weight loss: combination of diet modification, cardiovascular and strength training exercises   - had an extensive discussion regarding the adverse health consequences related to untreated sleep disordered breathing - specifically discussed the risks for hypertension, coronary artery disease, cardiac dysrhythmias, cerebrovascular disease, and diabetes - lifestyle modification discussed   -  discussed how sleep disruption can increase risk of accidents, particularly when driving - safe driving practices were discussed   Suspected asthma-mild restriction and decreased diffusing capacity on PFTs.  Continue on Breo daily.  Has upcoming high-resolution CT chest to rule out interstitial process.  Continue follow-up with primary care.  Tobacco use disorder   He has a history of chronic tobacco use since age 89 and is currently using Chantix  to aid smoking cessation. Encourage smoking cessation .  Continue follow-up primary care  Cocaine and cannabis use disorders   He currently uses cocaine and cannabis. Discuss the negative impact of substance use on health and the importance of cessation. Encourage cessation of cocaine and cannabis use and continue behavioral health counseling for support.  History of depression continue follow-up with behavioral health.  Morbid obesity.  Continue with healthy weight loss      Madelin Stank, NP 11/16/2023

## 2023-11-16 NOTE — Patient Instructions (Addendum)
 Set up for split night sleep study.  Work on healthy weight  Do not drive if sleepy   Continue on BREO 1 puff daily.  Follow up for upcoming CT chest as planned.   Work on not smoking.   Follow up with 6 weeks and As needed

## 2023-11-17 ENCOUNTER — Other Ambulatory Visit: Payer: Self-pay | Admitting: Student

## 2023-11-17 ENCOUNTER — Other Ambulatory Visit (HOSPITAL_COMMUNITY): Payer: Self-pay

## 2023-11-17 DIAGNOSIS — F251 Schizoaffective disorder, depressive type: Secondary | ICD-10-CM

## 2023-11-17 DIAGNOSIS — E785 Hyperlipidemia, unspecified: Secondary | ICD-10-CM

## 2023-11-17 MED ORDER — FLUTICASONE FUROATE-VILANTEROL 100-25 MCG/ACT IN AEPB: 1.0000 | INHALATION_SPRAY | Freq: Every day | RESPIRATORY_TRACT | 3 refills | Status: AC

## 2023-11-17 NOTE — Addendum Note (Signed)
 Addended by: ELICIA SHARPER on: 11/17/2023 01:48 PM   Modules accepted: Orders

## 2023-11-18 ENCOUNTER — Other Ambulatory Visit: Payer: Self-pay | Admitting: Student

## 2023-11-18 DIAGNOSIS — F17200 Nicotine dependence, unspecified, uncomplicated: Secondary | ICD-10-CM

## 2023-11-21 ENCOUNTER — Other Ambulatory Visit: Payer: Self-pay | Admitting: Student

## 2023-11-21 ENCOUNTER — Telehealth (HOSPITAL_COMMUNITY): Payer: Self-pay | Admitting: Psychiatry

## 2023-11-21 ENCOUNTER — Encounter (HOSPITAL_COMMUNITY): Admitting: Psychiatry

## 2023-11-21 ENCOUNTER — Other Ambulatory Visit (HOSPITAL_COMMUNITY): Payer: Self-pay

## 2023-11-21 NOTE — Telephone Encounter (Signed)
 Also requesting order for 81 mg aspirin  and anoroellipta, not on profile
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
## 2023-11-21 NOTE — Telephone Encounter (Signed)
 Patient did not show up for the appointment and was unable to join virtually.  Spoke with patient and would like to reschedule appointment for 11/3 at 11 AM. Patient reports having enough medications to make it to that appointment.  Ismael Franco, MD PGY-3 Psychiatry Resident

## 2023-11-21 NOTE — Telephone Encounter (Unsigned)
 Copied from CRM #[id number]. Topic: Clinical - Medication Refill >> Nov 21, 2023  5:15 PM Chiquita SQUIBB wrote: Medication: Anoroellipta 62.5 - 25 micrograms - Not on patients list  ondansetron  (ZOFRAN -ODT) 4 MG disintegrating tablet [[id number]}  Baby Asprin 81 MG Tablet   Has the patient contacted their pharmacy? Yes- Pharmacy is calling in  (Agent: If no, request that the patient contact the pharmacy for the refill. If patient does not wish to contact the pharmacy document the reason why and proceed with request.) (Agent: If yes, when and what did the pharmacy advise?)  This is the patient's preferred pharmacy:   [facility], [place] - [address] [place] [address] Phone: [phone] Fax: [phone]   Is this the correct pharmacy for this prescription? Yes If no, delete pharmacy and type the correct one.   Has the prescription been filled recently? No  Is the patient out of the medication? No  Has the patient been seen for an appointment in the last year OR does the patient have an upcoming appointment? Yes  Can we respond through MyChart? Yes  Agent: Please be advised that Rx refills may take up to 3 business days. We ask that you follow-up with your pharmacy.

## 2023-11-22 ENCOUNTER — Other Ambulatory Visit: Payer: Self-pay | Admitting: Student

## 2023-11-22 MED ORDER — ASPIRIN 81 MG PO TBEC: 81.0000 mg | DELAYED_RELEASE_TABLET | Freq: Every day | ORAL | 2 refills | Status: AC

## 2023-11-22 MED ORDER — ONDANSETRON 4 MG PO TBDP: 4.0000 mg | ORAL_TABLET | Freq: Three times a day (TID) | ORAL | 0 refills | Status: AC | PRN

## 2023-11-23 ENCOUNTER — Ambulatory Visit: Admitting: Adult Health

## 2023-11-24 ENCOUNTER — Telehealth: Payer: Self-pay | Admitting: *Deleted

## 2023-11-24 NOTE — Telephone Encounter (Signed)
 I called pt who stated he is changing to [facility] pharmacy d/t his insurance. I will ask Hme to fax med list to the pharmacy.

## 2023-11-24 NOTE — Telephone Encounter (Signed)
 Copied from CRM [id number]. Topic: Clinical - Prescription Issue >> Nov 24, 2023 12:42 PM Cherylann RAMAN wrote: Reason for CRM: [facility] needs an updated copy of medication list for the patient. Please fax: [phone] an updated copy of medication list for the patient as they are new to the patient.

## 2023-11-24 NOTE — Telephone Encounter (Signed)
 Hme stated she had already faxed a copy but she will fax it again.

## 2023-11-25 ENCOUNTER — Ambulatory Visit: Admitting: Student

## 2023-11-25 VITALS — BP 137/90 | HR 76 | Temp 98.0°F | Ht 70.0 in | Wt 260.0 lb

## 2023-11-25 DIAGNOSIS — I10 Essential (primary) hypertension: Secondary | ICD-10-CM | POA: Diagnosis not present

## 2023-11-25 DIAGNOSIS — K219 Gastro-esophageal reflux disease without esophagitis: Secondary | ICD-10-CM | POA: Diagnosis not present

## 2023-11-25 DIAGNOSIS — F17200 Nicotine dependence, unspecified, uncomplicated: Secondary | ICD-10-CM

## 2023-11-25 DIAGNOSIS — I2510 Atherosclerotic heart disease of native coronary artery without angina pectoris: Secondary | ICD-10-CM | POA: Diagnosis not present

## 2023-11-25 DIAGNOSIS — Z79899 Other long term (current) drug therapy: Secondary | ICD-10-CM

## 2023-11-25 DIAGNOSIS — F17290 Nicotine dependence, other tobacco product, uncomplicated: Secondary | ICD-10-CM

## 2023-11-25 MED ORDER — LOSARTAN POTASSIUM 25 MG PO TABS: 25.0000 mg | ORAL_TABLET | Freq: Every day | ORAL | 3 refills | Status: DC

## 2023-11-25 MED ORDER — PANTOPRAZOLE SODIUM 40 MG PO TBEC: 40.0000 mg | DELAYED_RELEASE_TABLET | Freq: Every day | ORAL | 3 refills | Status: DC

## 2023-11-25 NOTE — Assessment & Plan Note (Addendum)
 No acute concerns. Taking Chantix  since LOV. Provided 1-800 QUIT NOW on AVS for resources.   Time spent for smoking/tobacco cessation counseling: 4 minutes

## 2023-11-25 NOTE — Assessment & Plan Note (Addendum)
 Request to mail order pharmacy.   Orders:   pantoprazole  (PROTONIX ) 40 MG tablet; Take 1 tablet (40 mg total) by mouth daily.

## 2023-11-25 NOTE — Assessment & Plan Note (Addendum)
 Denies acute concerns. Lost to f/u and has notes from [facility]. Provided cardiology office number/address on AVS.

## 2023-11-25 NOTE — Progress Notes (Signed)
 CC: HTN f/u  HPI: Raymond Mcclure is a 58 y.o. male living with a history stated below and presents today for HTN f/u. Please see problem based assessment and plan for additional details.  Past Medical History:  Diagnosis Date   Cluster headaches    Drug abuse (HCC)    Hypertension    Migraines    Obesity    Tachycardia     Current Outpatient Medications on File Prior to Visit  Medication Sig Dispense Refill   aspirin  EC 81 MG tablet Take 1 tablet (81 mg total) by mouth daily. Swallow whole. 150 tablet 2   atorvastatin  (LIPITOR) 40 MG tablet TAKE ONE TABLET BY MOUTH AT BEDTIME *NEW PRESCRIPTION REQUEST* 90 tablet 3   Blood Pressure Monitoring (BLOOD PRESSURE CUFF) MISC 1 kit by Does not apply route daily. 1 each 0   escitalopram  (LEXAPRO ) 10 MG tablet TAKE 1 TABLET BY MOUTH EVERY DAY *NEW PRESCRIPTION REQUEST* 90 tablet 11   fluticasone  furoate-vilanterol (BREO ELLIPTA ) 100-25 MCG/ACT AEPB Inhale 1 puff into the lungs daily. 90 each 3   hydrOXYzine  (ATARAX ) 25 MG tablet Take 1 tablet (25 mg total) by mouth 3 (three) times daily as needed for anxiety. 90 tablet 2   naltrexone  (DEPADE) 50 MG tablet Take 1 tablet (50 mg total) by mouth daily. 60 tablet 0   ondansetron  (ZOFRAN -ODT) 4 MG disintegrating tablet Take 1 tablet (4 mg total) by mouth every 8 (eight) hours as needed for nausea or vomiting. 15 tablet 0   risperiDONE  (RISPERDAL ) 1 MG tablet Take 1 tablet (1 mg total) by mouth 2 (two) times daily. 120 tablet 0   Varenicline  Tartrate, Starter, (CHANTIX  STARTING MONTH PAK) 0.5 MG X 11 & 1 MG X 42 TBPK Take 1 mg by mouth 2 (two) times daily. 53 each 1   No current facility-administered medications on file prior to visit.    No family history on file.  Social History   Socioeconomic History   Marital status: Legally Separated    Spouse name: Not on file   Number of children: Not on file   Years of education: Not on file   Highest education level: Associate degree:  occupational, Scientist, product/process development, or vocational program  Occupational History   Not on file  Tobacco Use   Smoking status: Some Days    Types: Cigars   Smokeless tobacco: Never   Tobacco comments:    Couple of days .  Smokes when stressed.  Vaping Use   Vaping status: Every Day  Substance and Sexual Activity   Alcohol use: Not Currently    Comment: 1 Gallon/Day   Drug use: Not Currently    Types: Crack cocaine, Fentanyl , Cocaine, Marijuana    Comment: Daily Use   Sexual activity: Not Currently    Comment: crack  Other Topics Concern   Not on file  Social History Narrative   Not on file   Social Drivers of Health   Financial Resource Strain: High Risk (11/25/2023)   Overall Financial Resource Strain (CARDIA)    Difficulty of Paying Living Expenses: Very hard  Food Insecurity: Food Insecurity Present (11/25/2023)   Hunger Vital Sign    Worried About Running Out of Food in the Last Year: Often true    Ran Out of Food in the Last Year: Often true  Transportation Needs: Unmet Transportation Needs (11/25/2023)   PRAPARE - Administrator, Civil Service (Medical): Yes    Lack of Transportation (Non-Medical): Yes  Physical Activity: Sufficiently Active (11/25/2023)   Exercise Vital Sign    Days of Exercise per Week: 7 days    Minutes of Exercise per Session: 30 min  Stress: Stress Concern Present (11/25/2023)   [employer] of Occupational Health - Occupational Stress Questionnaire    Feeling of Stress: Rather much  Social Connections: Moderately Isolated (11/25/2023)   Social Connection and Isolation Panel    Frequency of Communication with Friends and Family: Three times a week    Frequency of Social Gatherings with Friends and Family: Once a week    Attends Religious Services: More than 4 times per year    Active Member of [employer] or Organizations: No    Attends Engineer, structural: Not on file    Marital Status: Separated  Intimate Partner Violence: Not  At Risk (09/02/2023)   Humiliation, Afraid, Rape, and Kick questionnaire    Fear of Current or Ex-Partner: No    Emotionally Abused: No    Physically Abused: No    Sexually Abused: No    Review of Systems: ROS negative except for what is noted on the assessment and plan.  Vitals:   11/25/23 1033 11/25/23 1043  BP: (!) 148/98 (!) 137/90  Pulse: 73 76  Temp: 98 F (36.7 C)   TempSrc: Oral   SpO2: 95%   Weight: 260 lb (117.9 kg)   Height: 5' 10 (1.778 m)    Physical Exam: Constitutional: alert, sitting up in chair comfortably, in no acute distress Cardiovascular: regular rate and rhythm Pulmonary/Chest: normal work of breathing on room air, lungs clear to auscultation bilaterally Neurological: alert & oriented x 3 Skin: warm and dry  Assessment & Plan:   Assessment & Plan Primary hypertension Status: Uncontrolled. BP today 137/90. Brought in home BP log and majority elevated SBP >140. Previously on BP meds but no longer due to care between [place] and [place]. Was on metoprolol  50 mg and losartan  25 mg in past but off for months. Last BMP in 10/2023 w/ normal renal and electrolytes. Hx of SVT s/p ablation 2023 seen by [facility] cardiology. RRR on exam.   Plan -Start Losartan  25 mg daily  -1 month RN BP check w/ BMP check    Orders:   losartan  (COZAAR ) 25 MG tablet; Take 1 tablet (25 mg total) by mouth daily.   Basic metabolic panel with GFR; Future  Gastroesophageal reflux disease, unspecified whether esophagitis present Request to mail order pharmacy.   Orders:   pantoprazole  (PROTONIX ) 40 MG tablet; Take 1 tablet (40 mg total) by mouth daily.  Mild CAD Denies acute concerns. Lost to f/u and has notes from [facility]. Provided cardiology office number/address on AVS.  Tobacco use disorder No acute concerns. Taking Chantix  since LOV. Provided 1-800 QUIT NOW on AVS for resources.   Time spent for smoking/tobacco cessation counseling: 4 minutes    Return in about 3  months (around 02/25/2024) for blood pressure, routine .   Patient discussed with Dr. Trudy Ozell Nearing, D.O. [facility] Internal Medicine, PGY-3 Clinic Phone: [phone] Date 11/25/2023 Time 5:26 PM

## 2023-11-25 NOTE — Patient Instructions (Addendum)
 Thank you, Mr.Petar D Defenbaugh for allowing us  to provide your care today. Today we discussed:  -Will restart Losartan  25 mg once a day -1 month follow up with Nurse for blood pressure check and lab work   [facility] [address]  [phone]  You can receive free nicotine  replacement therapy (patches, gum, or mints) by calling 1-800-QUIT NOW. Please call so we can get you on the path to becoming a non-smoker. I know it is hard, but you can do this!   I have ordered the following medication/changed the following medications:  Start the following medications: Meds ordered this encounter  Medications   losartan  (COZAAR ) 25 MG tablet    Sig: Take 1 tablet (25 mg total) by mouth daily.    Dispense:  90 tablet    Refill:  3   pantoprazole  (PROTONIX ) 40 MG tablet    Sig: Take 1 tablet (40 mg total) by mouth daily.    Dispense:  90 tablet    Refill:  3     Follow up: 1 month RN BP check and 3-4 months routine visit     Should you have any questions or concerns please call the internal medicine clinic at [phone].    Dallana Mavity, D.O. [facility]

## 2023-11-25 NOTE — Assessment & Plan Note (Signed)
 Status: Uncontrolled. BP today 137/90. Brought in home BP log and majority elevated SBP >140. Previously on BP meds but no longer due to care between [place] and [place]. Was on metoprolol  50 mg and losartan  25 mg in past but off for months. Last BMP in 10/2023 w/ normal renal and electrolytes. Hx of SVT s/p ablation 2023 seen by [facility] cardiology. RRR on exam.   Plan -Start Losartan  25 mg daily  -1 month RN BP check w/ BMP check    Orders:   losartan  (COZAAR ) 25 MG tablet; Take 1 tablet (25 mg total) by mouth daily.   Basic metabolic panel with GFR; Future

## 2023-11-28 ENCOUNTER — Other Ambulatory Visit: Payer: Self-pay | Admitting: Student

## 2023-11-28 DIAGNOSIS — F1020 Alcohol dependence, uncomplicated: Secondary | ICD-10-CM

## 2023-11-28 DIAGNOSIS — F251 Schizoaffective disorder, depressive type: Secondary | ICD-10-CM

## 2023-11-28 MED ORDER — NALTREXONE HCL 50 MG PO TABS: 50.0000 mg | ORAL_TABLET | Freq: Every day | ORAL | 3 refills | Status: DC

## 2023-11-28 NOTE — Telephone Encounter (Signed)
 Copied from CRM [id number]. Topic: Clinical - Medication Refill >> Nov 28, 2023 11:26 AM Cherylann RAMAN wrote: Medication: risperiDONE  (RISPERDAL ) 1 MG tablet naltrexone  (DEPADE) 50 MG tablet  Has the patient contacted their pharmacy? Yes (Agent: If no, request that the patient contact the pharmacy for the refill. If patient does not wish to contact the pharmacy document the reason why and proceed with request.) (Agent: If yes, when and what did the pharmacy advise?)  This is the patient's preferred pharmacy:  [facility] - [place]  [place], [place] - [address] [place] [address] Phone: [phone] Fax: [phone]  Is this the correct pharmacy for this prescription? Yes If no, delete pharmacy and type the correct one.   Has the prescription been filled recently? No  Is the patient out of the medication? No  Has the patient been seen for an appointment in the last year OR does the patient have an upcoming appointment? Yes  Can we respond through MyChart? Yes  Agent: Please be advised that Rx refills may take up to 3 business days. We ask that you follow-up with your pharmacy.

## 2023-11-29 ENCOUNTER — Ambulatory Visit: Admitting: Adult Health

## 2023-12-02 ENCOUNTER — Telehealth: Payer: Self-pay | Admitting: *Deleted

## 2023-12-02 NOTE — Telephone Encounter (Signed)
 Received a call from [facility], [facility]. Needs clarification on Chantix  - wants to know if pt still on the started pack, if not they will need a new rx. Thanks

## 2023-12-02 NOTE — Telephone Encounter (Signed)
 Called [facility] - left message for the pharmacist,Celina, pt should be on the starter pack still per Dr Elicia.

## 2023-12-05 ENCOUNTER — Other Ambulatory Visit (HOSPITAL_COMMUNITY): Payer: Self-pay | Admitting: Psychiatry

## 2023-12-05 ENCOUNTER — Ambulatory Visit (HOSPITAL_COMMUNITY): Admitting: Psychiatry

## 2023-12-05 ENCOUNTER — Encounter (HOSPITAL_COMMUNITY): Payer: Self-pay

## 2023-12-05 DIAGNOSIS — F1020 Alcohol dependence, uncomplicated: Secondary | ICD-10-CM

## 2023-12-05 DIAGNOSIS — F251 Schizoaffective disorder, depressive type: Secondary | ICD-10-CM

## 2023-12-05 MED ORDER — ESCITALOPRAM OXALATE 10 MG PO TABS: 10.0000 mg | ORAL_TABLET | Freq: Every day | ORAL | 0 refills | Status: DC

## 2023-12-05 MED ORDER — RISPERIDONE 1 MG PO TABS: 1.0000 mg | ORAL_TABLET | Freq: Two times a day (BID) | ORAL | 0 refills | Status: DC

## 2023-12-05 MED ORDER — NALTREXONE HCL 50 MG PO TABS: 50.0000 mg | ORAL_TABLET | Freq: Every day | ORAL | 0 refills | Status: DC

## 2023-12-05 MED ORDER — HYDROXYZINE HCL 25 MG PO TABS: 25.0000 mg | ORAL_TABLET | Freq: Three times a day (TID) | ORAL | 2 refills | Status: DC | PRN

## 2023-12-05 NOTE — Progress Notes (Signed)
 Patient did not show up for the appointment.  Spoke with patient and would like to reschedule appointment for 11/10. I sent in pt prescription of Lexapro , Atarax , Risperdal , and Naltrexone  for medication delivery as pt reports running out of medications.  Ismael Franco, MD PGY-3 Psychiatry Resident

## 2023-12-06 NOTE — Progress Notes (Signed)
 Internal Medicine Clinic Attending  Case discussed with the resident at the time of the visit.  We reviewed the resident's history and exam and pertinent patient test results.  I agree with the assessment, diagnosis, and plan of care documented in the resident's note.

## 2023-12-12 ENCOUNTER — Emergency Department (HOSPITAL_COMMUNITY)

## 2023-12-12 ENCOUNTER — Ambulatory Visit (HOSPITAL_COMMUNITY): Admitting: Psychiatry

## 2023-12-12 ENCOUNTER — Emergency Department (HOSPITAL_COMMUNITY): Admission: EM | Admit: 2023-12-12 | Discharge: 2023-12-12 | Disposition: A | Discharge status: Home / Self Care

## 2023-12-12 ENCOUNTER — Other Ambulatory Visit: Payer: Self-pay

## 2023-12-12 ENCOUNTER — Encounter (HOSPITAL_COMMUNITY): Payer: Self-pay

## 2023-12-12 VITALS — BP 133/82 | Wt 262.0 lb

## 2023-12-12 DIAGNOSIS — I10 Essential (primary) hypertension: Secondary | ICD-10-CM | POA: Insufficient documentation

## 2023-12-12 DIAGNOSIS — F1020 Alcohol dependence, uncomplicated: Secondary | ICD-10-CM | POA: Diagnosis not present

## 2023-12-12 DIAGNOSIS — M25571 Pain in right ankle and joints of right foot: Secondary | ICD-10-CM | POA: Insufficient documentation

## 2023-12-12 DIAGNOSIS — Z79899 Other long term (current) drug therapy: Secondary | ICD-10-CM | POA: Diagnosis not present

## 2023-12-12 DIAGNOSIS — F251 Schizoaffective disorder, depressive type: Secondary | ICD-10-CM

## 2023-12-12 DIAGNOSIS — F1590 Other stimulant use, unspecified, uncomplicated: Secondary | ICD-10-CM

## 2023-12-12 DIAGNOSIS — Z7982 Long term (current) use of aspirin: Secondary | ICD-10-CM | POA: Diagnosis not present

## 2023-12-12 DIAGNOSIS — F17200 Nicotine dependence, unspecified, uncomplicated: Secondary | ICD-10-CM

## 2023-12-12 DIAGNOSIS — R102 Pelvic and perineal pain unspecified side: Secondary | ICD-10-CM | POA: Diagnosis present

## 2023-12-12 DIAGNOSIS — W1839XA Other fall on same level, initial encounter: Secondary | ICD-10-CM | POA: Diagnosis not present

## 2023-12-12 DIAGNOSIS — Z91010 Allergy to peanuts: Secondary | ICD-10-CM | POA: Diagnosis not present

## 2023-12-12 DIAGNOSIS — M533 Sacrococcygeal disorders, not elsewhere classified: Secondary | ICD-10-CM | POA: Diagnosis not present

## 2023-12-12 MED ORDER — HYDROXYZINE HCL 25 MG PO TABS: 25.0000 mg | ORAL_TABLET | Freq: Three times a day (TID) | ORAL | 2 refills | Status: DC | PRN

## 2023-12-12 MED ORDER — NAPROXEN 500 MG PO TABS: 500.0000 mg | ORAL_TABLET | Freq: Two times a day (BID) | ORAL | 0 refills | Status: DC

## 2023-12-12 MED ORDER — NALTREXONE HCL 50 MG PO TABS: 50.0000 mg | ORAL_TABLET | Freq: Every day | ORAL | 0 refills | Status: DC

## 2023-12-12 MED ORDER — ESCITALOPRAM OXALATE 10 MG PO TABS: 10.0000 mg | ORAL_TABLET | Freq: Every day | ORAL | 0 refills | Status: DC

## 2023-12-12 MED ORDER — RISPERIDONE 1 MG PO TABS: 1.0000 mg | ORAL_TABLET | Freq: Two times a day (BID) | ORAL | 0 refills | Status: DC

## 2023-12-12 MED ORDER — IBUPROFEN 800 MG PO TABS
800.0000 mg | ORAL_TABLET | Freq: Once | ORAL | Status: AC
  Administered 2023-12-12: 800 mg via ORAL
  Filled 2023-12-12: qty 1

## 2023-12-12 MED ORDER — OXYCODONE-ACETAMINOPHEN 5-325 MG PO TABS
1.0000 | ORAL_TABLET | Freq: Once | ORAL | Status: AC
  Administered 2023-12-12: 1 via ORAL
  Filled 2023-12-12: qty 1

## 2023-12-12 NOTE — Discharge Instructions (Signed)
 Your workup today was reassuring.  Please take the naproxen as needed for pain.  Follow up with your doctor and return to the ER for worsening symptoms.

## 2023-12-12 NOTE — ED Triage Notes (Signed)
 Pt reports with right groin pain x 2 weeks. Pt states that he has been lifting heavy things recently. Pt reports a hx of inguinal hernia.

## 2023-12-12 NOTE — ED Notes (Signed)
 Dispo papers and education provided. Waiting on pt to get dressed

## 2023-12-12 NOTE — Addendum Note (Signed)
 Addended by: CARVIN CROCK on: 12/12/2023 04:15 PM   Modules accepted: Level of Service
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
## 2023-12-12 NOTE — ED Notes (Signed)
Pt in CT. Unable to medicate at this time.

## 2023-12-12 NOTE — ED Provider Notes (Signed)
 [place] EMERGENCY DEPARTMENT AT [facility] Provider Note   CSN: [id number] Arrival date & time: 12/12/23  9264     Patient presents with: Groin Pain   Raymond Mcclure is a 58 y.o. male.   58 year old male with past medical history of hypertension and inguinal hernia presenting to the emergency department today with pelvic pain.  The patient states that he tripped and fell yesterday.  He states that he has chronic pain from where he had an inguinal hernia repair years ago and states that this has been bothering him now over the past few months.  He states that this is hurting worse since yesterday.  Reports that he is having pain mostly on the right side over his sacrum.  There is no significant hip pain with this.  The patient reports normal bowel movements and denies any nausea or vomiting.  He is also complaining of some right ankle pain since he fell.  He did not hit his head or lose consciousness when he fell yesterday.  He is not on any blood thinners.   Groin Pain       Prior to Admission medications   Medication Sig Start Date End Date Taking? Authorizing Provider  naproxen  (NAPROSYN ) 500 MG tablet Take 1 tablet (500 mg total) by mouth 2 (two) times daily. 12/12/23  Yes [name], MD  aspirin  EC 81 MG tablet Take 1 tablet (81 mg total) by mouth daily. Swallow whole. 11/22/23 11/21/24  Zheng, Michael, DO  atorvastatin  (LIPITOR) 40 MG tablet TAKE ONE TABLET BY MOUTH AT BEDTIME *NEW PRESCRIPTION REQUEST* 11/17/23   Elicia Sharper, DO  Blood Pressure Monitoring (BLOOD PRESSURE CUFF) MISC 1 kit by Does not apply route daily. 10/28/23 12/27/23  Tobie Gaines, DO  escitalopram  (LEXAPRO ) 10 MG tablet Take 1 tablet (10 mg total) by mouth daily. 12/05/23   Izella Ismael NOVAK, MD  fluticasone  furoate-vilanterol (BREO ELLIPTA ) 100-25 MCG/ACT AEPB Inhale 1 puff into the lungs daily. 11/17/23   Zheng, Michael, DO  hydrOXYzine  (ATARAX ) 25 MG tablet Take 1 tablet (25 mg total) by  mouth 3 (three) times daily as needed for anxiety. 12/05/23   Izella Ismael NOVAK, MD  losartan  (COZAAR ) 25 MG tablet Take 1 tablet (25 mg total) by mouth daily. 11/25/23   Zheng, Michael, DO  naltrexone  (DEPADE) 50 MG tablet Take 1 tablet (50 mg total) by mouth daily. 12/05/23   Izella Ismael NOVAK, MD  ondansetron  (ZOFRAN -ODT) 4 MG disintegrating tablet Take 1 tablet (4 mg total) by mouth every 8 (eight) hours as needed for nausea or vomiting. 11/22/23   Zheng, Michael, DO  pantoprazole  (PROTONIX ) 40 MG tablet Take 1 tablet (40 mg total) by mouth daily. 11/25/23   Zheng, Michael, DO  risperiDONE  (RISPERDAL ) 1 MG tablet Take 1 tablet (1 mg total) by mouth 2 (two) times daily. 12/05/23   Izella Ismael NOVAK, MD  Varenicline  Tartrate, Starter, (CHANTIX  STARTING MONTH PAK) 0.5 MG X 11 & 1 MG X 42 TBPK Take 1 mg by mouth 2 (two) times daily. 10/28/23   Tobie Gaines, DO    Allergies: Bee venom, Peanut-containing drug products, and Covid-19 (mrna) vaccine    Review of Systems  Musculoskeletal:  Positive for arthralgias.  All other systems reviewed and are negative.   Updated Vital Signs BP (!) 150/101 (BP Location: Left Arm)   Pulse 90   Temp 98.3 F (36.8 C) (Oral)   Resp 20   SpO2 99%   Physical Exam Vitals and  nursing note reviewed.   Gen: NAD Eyes: PERRL, EOMI HEENT: no oropharyngeal swelling Neck: trachea midline, no midline tenderness Resp: clear to auscultation bilaterally Card: RRR, no murmurs, rubs, or gallops Abd: nontender, nondistended Extremities: no calf tenderness, no edema MSK: The patient is tender over the right sacral region with normal range of motion of the right hip with no tenderness over the proximal femur noted, the patient is also tender over the lateral aspect of the right ankle with no significant swelling noted.  The patient has normal range of motion over the ankle, knee, and hips in the bilateral lower extremities Vascular: 2+ radial pulses bilaterally, 2+ DP pulses  bilaterally Neuro: Equal strength sensation throughout bilateral lower extremities Skin: no rashes Psyc: acting appropriately   (all labs ordered are listed, but only abnormal results are displayed) Labs Reviewed - No data to display  EKG: None  Radiology: CT PELVIS WO CONTRAST Result Date: 12/12/2023 EXAM: CT PELVIS, WITHOUT IV CONTRAST 12/12/2023 08:37:07 AM TECHNIQUE: Axial images were acquired through the pelvis without IV contrast. Reformatted images were reviewed. Automated exposure control, iterative reconstruction, and/or weight based adjustment of the mA/kV was utilized to reduce the radiation dose to as low as reasonably achievable. COMPARISON: CT abdomen and pelvis 10/04/2023. CLINICAL HISTORY: 58 year old male with 2 weeks of right groin pain after heavy lifting hip trauma and fall. FINDINGS: BONES: Bilateral sacrum, SI joints, pelvis and proximal femurs are intact. No acute traumatic injury identified in the pelvis. Lower lumbar chronic disc bulging, endplate spurring, and mild to moderate facet hypertrophy is stable no significant lower lumbar spinal stenosis by CT. Bilateral SI joint degenerative vacuum phenomenon. JOINTS: No dislocation. The joint spaces are normal. Bilateral SI joint degenerative vacuum phenomenon. SOFT TISSUES: Chronic midline ventral abdominal wall postoperative changes are stable with no adverse features. Ventral abdominal wall, panniculus mild asymmetric subcutaneous stranding is stable and appears chronic on series 2 image 31. Negative for inguinal hernia. INTRAPELVIC CONTENTS: No pelvis free fluid. Visible large and small bowel loops are nondilated, with normal appendix on series 2 image 6. Mild but increased retained stool in the rectum. Unremarkable urinary bladder. Nondilated distal abdominal aorta. No calcified atherosclerosis in the pelvis. IMPRESSION: 1. No acute traumatic injury identified about the pelvis. No inguinal hernia. Electronically signed by:  Helayne Hurst MD 12/12/2023 08:44 AM EST RP Workstation: [id number]   DG Ankle Complete Right Result Date: 12/12/2023 CLINICAL DATA:  Fall and trauma to the right ankle. EXAM: RIGHT ANKLE - COMPLETE 3+ VIEW COMPARISON:  None Available. FINDINGS: There is no acute fracture or dislocation. The bones are well mineralized. The ankle mortise is intact. The soft tissues are unremarkable. IMPRESSION: Negative. Electronically Signed   By: Vanetta Chou M.D.   On: 12/12/2023 08:33     Procedures   Medications Ordered in the ED  oxyCODONE -acetaminophen  (PERCOCET/ROXICET) 5-325 MG per tablet 1 tablet (1 tablet Oral Given 12/12/23 0834)  ibuprofen  (ADVIL ) tablet 800 mg (800 mg Oral Given 12/12/23 0834)                                    Medical Decision Making 58 year old male with past medical history of hypertension and inguinal hernia in the past presenting to the emergency department today with pain in his right sacrum and right ankle after a mechanical fall yesterday.  I do not appreciate an obvious hernia here but the patient's body habitus does make  evaluation somewhat difficult here.  Will further evaluate the patient here with a CT of his pelvis to evaluate for fracture as well as signs of incarcerated hernia although the patient denies any significant abdominal pain and has been having normal bowel movements with no vomiting.  This also evaluate for sacral fractures.  Will obtain an x-ray of his right ankle to evaluate for fracture.  I will give patient Percocet for pain and reevaluate for ultimate disposition.  The patient's x-rays are unremarkable.  Offered the patient crutches and Aircast but he declines and thinks that he is feeling better with the medications.  He will be discharged with return precautions.  There is no evidence of hernia on his CT.  Amount and/or Complexity of Data Reviewed Radiology: ordered.  Risk Prescription drug management.        Final diagnoses:  Sacral  pain  Acute right ankle pain    ED Discharge Orders          Ordered    naproxen  (NAPROSYN ) 500 MG tablet  2 times daily        12/12/23 0940               [name], MD 12/12/23 [phone]

## 2023-12-30 ENCOUNTER — Other Ambulatory Visit: Payer: Self-pay | Admitting: Medical Genetics

## 2024-01-02 ENCOUNTER — Ambulatory Visit: Admitting: Adult Health

## 2024-01-03 ENCOUNTER — Encounter: Payer: Self-pay | Admitting: Cardiology

## 2024-01-03 ENCOUNTER — Other Ambulatory Visit (HOSPITAL_COMMUNITY): Payer: Self-pay

## 2024-01-03 ENCOUNTER — Ambulatory Visit: Attending: Cardiology | Admitting: Cardiology

## 2024-01-03 VITALS — BP 146/86 | HR 81 | Ht 70.0 in

## 2024-01-03 DIAGNOSIS — I2510 Atherosclerotic heart disease of native coronary artery without angina pectoris: Secondary | ICD-10-CM | POA: Diagnosis not present

## 2024-01-03 DIAGNOSIS — E782 Mixed hyperlipidemia: Secondary | ICD-10-CM

## 2024-01-03 DIAGNOSIS — Z01818 Encounter for other preprocedural examination: Secondary | ICD-10-CM

## 2024-01-03 DIAGNOSIS — R0609 Other forms of dyspnea: Secondary | ICD-10-CM

## 2024-01-03 DIAGNOSIS — I10 Essential (primary) hypertension: Secondary | ICD-10-CM | POA: Diagnosis not present

## 2024-01-03 MED ORDER — NITROGLYCERIN 0.4 MG SL SUBL
0.4000 mg | SUBLINGUAL_TABLET | SUBLINGUAL | 5 refills | Status: AC | PRN
  Filled 2024-01-03: qty 25, 15d supply, fill #0

## 2024-01-03 MED ORDER — NITROGLYCERIN 0.4 MG SL SUBL: 0.4000 mg | SUBLINGUAL_TABLET | SUBLINGUAL | 5 refills | Status: DC | PRN

## 2024-01-03 MED ORDER — LOSARTAN POTASSIUM 50 MG PO TABS: 50.0000 mg | ORAL_TABLET | Freq: Every day | ORAL | 3 refills | Status: DC

## 2024-01-03 MED ORDER — LOSARTAN POTASSIUM 50 MG PO TABS
50.0000 mg | ORAL_TABLET | Freq: Every day | ORAL | 3 refills | Status: DC
  Filled 2024-01-03: qty 30, 30d supply, fill #0

## 2024-01-03 NOTE — H&P (View-Only) (Signed)
 Cardiology Office Note:    Date:  01/03/2024   ID:  Raymond Mcclure, DOB Jun 30, 1965, MRN [id number]  PCP:  Raymond Sharper, DO  Cardiologist:  Raymond Cormier, DO  Electrophysiologist:  None   Referring MD: Raymond Mcclure, *   I am having chest pain  History of Present Illness:    Raymond Mcclure is a 58 y.o. male with a hx of coronary artery disease with nonobstructive LAD in June 2021, SVT status post ablation, hypertension, hyperlipidemia, obesity, OSA, tobacco abuse, history of ILR now explanted, history of polysubstance abuse which was related to suicidal ideation, bipolar disorder, OSA on CPAP.  Per record I have seen the patient once which was  in December 2022.  At that time it was noted that he follows with [facility].  Appears that he did transition back to the cardiology group at [facility] it appears that his last visit with them was in March 2024.  Today he tells me that he has been experiencing intermittent chest discomfort.  He described as a midsternal chest pain that comes and goes.  He says that he feeling a pressure-like sensation sometimes there is radiation to the upper shoulders sometimes there is no.  Is been getting progressively worse he is worried about this.  He tells it was the most bothersome symptoms of the fact that he gets short of breath.  He notes that once he is doing a little bit of walking he gets short of breath and is concerning to him.  At times he will feel lightheaded but the shortness of breath is even the worst of all.  Past Medical History:  Diagnosis Date   Cluster headaches    Drug abuse (HCC)    Hypertension    Migraines    Obesity    Tachycardia     Past Surgical History:  Procedure Laterality Date   CARDIAC CATHETERIZATION     LOOP RECORDER IMPLANT     WRIST SURGERY     nerve repair    Current Medications: Current Meds  Medication Sig   aspirin  EC 81 MG tablet Take 1 tablet (81 mg total) by mouth daily. Swallow whole.    atorvastatin  (LIPITOR) 40 MG tablet TAKE ONE TABLET BY MOUTH AT BEDTIME *NEW PRESCRIPTION REQUEST*   escitalopram  (LEXAPRO ) 10 MG tablet Take 1 tablet (10 mg total) by mouth daily.   fluticasone  furoate-vilanterol (BREO ELLIPTA ) 100-25 MCG/ACT AEPB Inhale 1 puff into the lungs daily.   hydrOXYzine  (ATARAX ) 25 MG tablet Take 1 tablet (25 mg total) by mouth 3 (three) times daily as needed for anxiety.   naltrexone  (DEPADE) 50 MG tablet Take 1 tablet (50 mg total) by mouth daily.   naproxen  (NAPROSYN ) 500 MG tablet Take 1 tablet (500 mg total) by mouth 2 (two) times daily.   ondansetron  (ZOFRAN -ODT) 4 MG disintegrating tablet Take 1 tablet (4 mg total) by mouth every 8 (eight) hours as needed for nausea or vomiting.   pantoprazole  (PROTONIX ) 40 MG tablet Take 1 tablet (40 mg total) by mouth daily.   risperiDONE  (RISPERDAL ) 1 MG tablet Take 1 tablet (1 mg total) by mouth 2 (two) times daily.   [DISCONTINUED] losartan  (COZAAR ) 25 MG tablet Take 1 tablet (25 mg total) by mouth daily.   [DISCONTINUED] losartan  (COZAAR ) 50 MG tablet Take 1 tablet (50 mg total) by mouth daily.   [DISCONTINUED] nitroGLYCERIN  (NITROSTAT ) 0.4 MG SL tablet Place 1 tablet (0.4 mg total) under the tongue every 5 (five) minutes as needed  for chest pain.     Allergies:   Bee venom, Peanut-containing drug products, and Covid-19 (mrna) vaccine   Social History   Socioeconomic History   Marital status: Legally Separated    Spouse name: Not on file   Number of children: Not on file   Years of education: Not on file   Highest education level: Associate degree: occupational, scientist, product/process development, or vocational program  Occupational History   Not on file  Tobacco Use   Smoking status: Some Days    Types: Cigars   Smokeless tobacco: Never   Tobacco comments:    Couple of days .  Smokes when stressed.  Vaping Use   Vaping status: Every Day  Substance and Sexual Activity   Alcohol use: Not Currently    Comment: 1 Gallon/Day   Drug  use: Not Currently    Types: Crack cocaine, Fentanyl , Cocaine, Marijuana    Comment: Daily Use   Sexual activity: Not Currently    Comment: crack  Other Topics Concern   Not on file  Social History Narrative   Not on file   Social Drivers of Health   Financial Resource Strain: High Risk (11/25/2023)   Overall Financial Resource Strain (CARDIA)    Difficulty of Paying Living Expenses: Very hard  Food Insecurity: Food Insecurity Present (11/25/2023)   Hunger Vital Sign    Worried About Running Out of Food in the Last Year: Often true    Ran Out of Food in the Last Year: Often true  Transportation Needs: Unmet Transportation Needs (11/25/2023)   PRAPARE - Administrator, Civil Service (Medical): Yes    Lack of Transportation (Non-Medical): Yes  Physical Activity: Sufficiently Active (11/25/2023)   Exercise Vital Sign    Days of Exercise per Week: 7 days    Minutes of Exercise per Session: 30 min  Stress: Stress Concern Present (11/25/2023)   [employer] of Occupational Health - Occupational Stress Questionnaire    Feeling of Stress: Rather much  Social Connections: Moderately Isolated (11/25/2023)   Social Connection and Isolation Panel    Frequency of Communication with Friends and Family: Three times a week    Frequency of Social Gatherings with Friends and Family: Once a week    Attends Religious Services: More than 4 times per year    Active Member of [employer] or Organizations: No    Attends Engineer, Structural: Not on file    Marital Status: Separated     Family History: The patient's family history is not on file.  ROS:   Review of Systems  Constitution: Negative for decreased appetite, fever and weight gain.  HENT: Negative for congestion, ear discharge, hoarse voice and sore throat.   Eyes: Negative for discharge, redness, vision loss in right eye and visual halos.  Cardiovascular: Negative for chest pain, dyspnea on exertion, leg  swelling, orthopnea and palpitations.  Respiratory: Negative for cough, hemoptysis, shortness of breath and snoring.   Endocrine: Negative for heat intolerance and polyphagia.  Hematologic/Lymphatic: Negative for bleeding problem. Does not bruise/bleed easily.  Skin: Negative for flushing, nail changes, rash and suspicious lesions.  Musculoskeletal: Negative for arthritis, joint pain, muscle cramps, myalgias, neck pain and stiffness.  Gastrointestinal: Negative for abdominal pain, bowel incontinence, diarrhea and excessive appetite.  Genitourinary: Negative for decreased libido, genital sores and incomplete emptying.  Neurological: Negative for brief paralysis, focal weakness, headaches and loss of balance.  Psychiatric/Behavioral: Negative for altered mental status, depression and suicidal ideas.  Allergic/Immunologic:  Negative for HIV exposure and persistent infections.    EKGs/Labs/Other Studies Reviewed:    The following studies were reviewed today:   EKG:  The ekg ordered today demonstrates   Recent Labs: 03/03/2023: TSH 0.697 10/04/2023: ALT 17; BUN 14; Creatinine, Ser 1.02; Hemoglobin 14.4; Platelets 329; Potassium 4.2; Sodium 141  Recent Lipid Panel    Component Value Date/Time   CHOL 159 03/03/2023 1640   TRIG 65 03/03/2023 1640   HDL 39 (L) 03/03/2023 1640   CHOLHDL 4.1 03/03/2023 1640   VLDL 13 03/03/2023 1640   LDLCALC 107 (H) 03/03/2023 1640    Physical Exam:    VS:  BP (!) 146/86 (BP Location: Left Arm, Patient Position: Sitting, Cuff Size: Large)   Pulse 81   Ht 5' 10 (1.778 m)   SpO2 97%   BMI 37.59 kg/m     Wt Readings from Last 3 Encounters:  11/25/23 260 lb (117.9 kg)  11/16/23 259 lb (117.5 kg)  11/10/23 260 lb (117.9 kg)     GEN: Well nourished, well developed in no acute distress HEENT: Normal NECK: No JVD; No carotid bruits LYMPHATICS: No lymphadenopathy CARDIAC: S1S2 noted,RRR, no murmurs, rubs, gallops RESPIRATORY:  Clear to auscultation  without rales, wheezing or rhonchi  ABDOMEN: Soft, non-tender, non-distended, +bowel sounds, no guarding. EXTREMITIES: No edema, No cyanosis, no clubbing MUSCULOSKELETAL:  No deformity  SKIN: Warm and dry NEUROLOGIC:  Alert and oriented x 3, non-focal PSYCHIATRIC:  Normal affect, good insight  ASSESSMENT:    1. DOE (dyspnea on exertion)   2. Pre-procedural examination   3. Coronary artery disease involving native coronary artery of native heart, unspecified whether angina present   4. Primary hypertension   5. Mixed hyperlipidemia    PLAN:      Coronary artery disease with chest pain-any ischemic evaluation with his risk factors is best to pursue left heart catheterization in this patient.  The patient understands that risks include but are not limited to stroke (1 in 1000), death (1 in 1000), kidney failure [usually temporary] (1 in 500), bleeding (1 in 200), allergic reaction [possibly serious] (1 in 200), and agrees to proceed.  In the meantime we will continue his aspirin  81 mg daily, atorvastatin  40 mg daily.  He is hypertensive we will increase his losartan  to 50 mg daily.  Will add sublingual nitroglycerin  as needed.   With his dyspnea on exertion and sleep apnea we will get an echocardiogram to assess for any structural abnormalities and also look at the pressures on the right side of the heart.   Hyperlipidemia - continue with current statin medication.  Hypertension-as noted above blood pressure is elevated will increase losartan  to 25 mg daily.  The patient understands the need to lose weight with diet and exercise. We have discussed specific strategies for this.  The patient is in agreement with the above plan. The patient left the office in stable condition.  The patient will follow up in   Medication Adjustments/Labs and Tests Ordered: Current medicines are reviewed at length with the patient today.  Concerns regarding medicines are outlined above.  Orders Placed  This Encounter  Procedures   EKG 12-Lead   ECHOCARDIOGRAM COMPLETE   Meds ordered this encounter  Medications   DISCONTD: losartan  (COZAAR ) 50 MG tablet    Sig: Take 1 tablet (50 mg total) by mouth daily.    Dispense:  90 tablet    Refill:  3   DISCONTD: nitroGLYCERIN  (NITROSTAT ) 0.4 MG SL tablet  Sig: Place 1 tablet (0.4 mg total) under the tongue every 5 (five) minutes as needed for chest pain.    Dispense:  25 tablet    Refill:  5   nitroGLYCERIN  (NITROSTAT ) 0.4 MG SL tablet    Sig: Place 1 tablet (0.4 mg total) under the tongue every 5 (five) minutes as needed for chest pain (Up to three times).    Dispense:  25 tablet    Refill:  5   losartan  (COZAAR ) 50 MG tablet    Sig: Take 1 tablet (50 mg total) by mouth daily.    Dispense:  30 tablet    Refill:  3    Patient Instructions  Medication Instructions:  Your physician has recommended you make the following change in your medication:  As needed:  Nitroglycerin  0.4 mg place 1 tablet under your tongue every 5 minutes (up to three times) as needed for chest pain. INCREASE: Losartan  50 mg once daily  *If you need a refill on your cardiac medications before your next appointment, please call your pharmacy*   Testing/Procedures: Your physician has requested that you have an echocardiogram. Echocardiography is a painless test that uses sound waves to create images of your heart. It provides your doctor with information about the size and shape of your heart and how well your heart's chambers and valves are working. This procedure takes approximately one hour. There are no restrictions for this procedure. Please do NOT wear cologne, perfume, aftershave, or lotions (deodorant is allowed). Please arrive 15 minutes prior to your appointment time.  Please note: We ask at that you not bring children with you during ultrasound (echo/ vascular) testing. Due to room size and safety concerns, children are not allowed in the ultrasound rooms  during exams. Our front office staff cannot provide observation of children in our lobby area while testing is being conducted. An adult accompanying a patient to their appointment will only be allowed in the ultrasound room at the discretion of the ultrasound technician under special circumstances. We apologize for any inconvenience.   [facility] A DEPT OF [facility] AT [address] A DEPT OF THE [place] [address] [place] [address] Dept: [phone] Loc: [phone]  KHAMERON GRUENWALD  01/03/2024  You are scheduled for a Cardiac Catheterization on Tuesday, December 9 with Dr. Peter Jordan.  1. Please arrive at the [facility] (Main Entrance A) at [facility]: [address] [place], [place] [address] at 5:30 AM (This time is 2 hour(s) before your procedure to ensure your preparation).   Free valet parking service is available. You will check in at ADMITTING. The support person will be asked to wait in the waiting room.  It is OK to have someone drop you off and come back when you are ready to be discharged.    Special note: Every effort is made to have your procedure done on time. Please understand that emergencies sometimes delay scheduled procedures.  2. Diet: Nothing to eat after midnight.   3. Hydration: You need to be well hydrated before your procedure. On December 9, you may drink approved liquids (see below) until 2 hours before the procedure, with 16 oz of water as your last intake.   List of approved liquids water, clear juice, clear tea, black coffee, fruit juices, non-citric and without pulp, carbonated beverages, Gatorade, Kool -Aid, plain Jello-O and plain ice popsicles.  4. Labs: You will not  need to have blood drawn.   5. Medication instructions in preparation for your procedure:   Contrast Allergy: No   Stop taking, Cozaar  (Losartan ) Dec 7th, has to be held 24 hours before procedure.    On the  morning of your procedure, take your Aspirin  81 mg and any morning medicines NOT listed above.  You may use sips of water.  6. Plan to go home the same day, you will only stay overnight if medically necessary. 7. Bring a current list of your medications and current insurance cards. 8. You MUST have a responsible person to drive you home. 9. Someone MUST be with you the first 24 hours after you arrive home or your discharge will be delayed. 10. Please wear clothes that are easy to get on and off and wear slip-on shoes.  Thank you for allowing us  to care for you!   -- [facility]   Follow-Up: At [facility], you and your health needs are our priority.  As part of our continuing mission to provide you with exceptional heart care, our providers are all part of one team.  This team includes your primary Cardiologist (physician) and Advanced Practice Providers or APPs (Physician Assistants and Nurse Practitioners) who all work together to provide you with the care you need, when you need it.  Your next appointment:   2-4 week(s)   Provider:   Rayjon Wery, DO  or APP   Other Instructions:     Adopting a Healthy Lifestyle.  Know what a healthy weight is for you (roughly BMI <25) and aim to maintain this   Aim for 7+ servings of fruits and vegetables daily   65-80+ fluid ounces of water or unsweet tea for healthy kidneys   Limit to max 1 drink of alcohol per day; avoid smoking/tobacco   Limit animal fats in diet for cholesterol and heart health - choose grass fed whenever available   Avoid highly processed foods, and foods high in saturated/trans fats   Aim for low stress - take time to unwind and care for your mental health   Aim for 150 min of moderate intensity exercise weekly for heart health, and weights twice weekly for bone health   Aim for 7-9 hours of sleep daily   When it comes to diets, agreement about the perfect plan isnt  easy to find, even among the experts. Experts at the [facility] [employer] developed an idea known as the Healthy Eating Plate. Just imagine a plate divided into logical, healthy portions.   The emphasis is on diet quality:   Load up on vegetables and fruits - one-half of your plate: Aim for color and variety, and remember that potatoes dont count.   Go for whole grains - one-quarter of your plate: Whole wheat, barley, wheat berries, quinoa, oats, brown rice, and foods made with them. If you want pasta, go with whole wheat pasta.   Protein power - one-quarter of your plate: Fish, chicken, beans, and nuts are all healthy, versatile protein sources. Limit red meat.   The diet, however, does go beyond the plate, offering a few other suggestions.   Use healthy plant oils, such as olive, canola, soy, corn, sunflower and peanut. Check the labels, and avoid partially hydrogenated oil, which have unhealthy trans fats.   If youre thirsty, drink water. Coffee and tea are good in moderation, but skip sugary drinks and limit milk and dairy products to one or two  daily servings.   The type of carbohydrate in the diet is more important than the amount. Some sources of carbohydrates, such as vegetables, fruits, whole grains, and beans-are healthier than others.   Finally, stay active  Signed, Raymond Huntsman, DO  01/03/2024 9:56 AM    [facility]

## 2024-01-03 NOTE — Progress Notes (Signed)
 Cardiology Office Note:    Date:  01/03/2024   ID:  Raymond Mcclure, DOB Jun 30, 1965, MRN [id number]  PCP:  Elicia Sharper, DO  Cardiologist:  Calum Cormier, DO  Electrophysiologist:  None   Referring MD: Jeanelle Layman CROME, *   I am having chest pain  History of Present Illness:    Raymond Mcclure is a 58 y.o. male with a hx of coronary artery disease with nonobstructive LAD in June 2021, SVT status post ablation, hypertension, hyperlipidemia, obesity, OSA, tobacco abuse, history of ILR now explanted, history of polysubstance abuse which was related to suicidal ideation, bipolar disorder, OSA on CPAP.  Per record I have seen the patient once which was  in December 2022.  At that time it was noted that he follows with [facility].  Appears that he did transition back to the cardiology group at [facility] it appears that his last visit with them was in March 2024.  Today he tells me that he has been experiencing intermittent chest discomfort.  He described as a midsternal chest pain that comes and goes.  He says that he feeling a pressure-like sensation sometimes there is radiation to the upper shoulders sometimes there is no.  Is been getting progressively worse he is worried about this.  He tells it was the most bothersome symptoms of the fact that he gets short of breath.  He notes that once he is doing a little bit of walking he gets short of breath and is concerning to him.  At times he will feel lightheaded but the shortness of breath is even the worst of all.  Past Medical History:  Diagnosis Date   Cluster headaches    Drug abuse (HCC)    Hypertension    Migraines    Obesity    Tachycardia     Past Surgical History:  Procedure Laterality Date   CARDIAC CATHETERIZATION     LOOP RECORDER IMPLANT     WRIST SURGERY     nerve repair    Current Medications: Current Meds  Medication Sig   aspirin  EC 81 MG tablet Take 1 tablet (81 mg total) by mouth daily. Swallow whole.    atorvastatin  (LIPITOR) 40 MG tablet TAKE ONE TABLET BY MOUTH AT BEDTIME *NEW PRESCRIPTION REQUEST*   escitalopram  (LEXAPRO ) 10 MG tablet Take 1 tablet (10 mg total) by mouth daily.   fluticasone  furoate-vilanterol (BREO ELLIPTA ) 100-25 MCG/ACT AEPB Inhale 1 puff into the lungs daily.   hydrOXYzine  (ATARAX ) 25 MG tablet Take 1 tablet (25 mg total) by mouth 3 (three) times daily as needed for anxiety.   naltrexone  (DEPADE) 50 MG tablet Take 1 tablet (50 mg total) by mouth daily.   naproxen  (NAPROSYN ) 500 MG tablet Take 1 tablet (500 mg total) by mouth 2 (two) times daily.   ondansetron  (ZOFRAN -ODT) 4 MG disintegrating tablet Take 1 tablet (4 mg total) by mouth every 8 (eight) hours as needed for nausea or vomiting.   pantoprazole  (PROTONIX ) 40 MG tablet Take 1 tablet (40 mg total) by mouth daily.   risperiDONE  (RISPERDAL ) 1 MG tablet Take 1 tablet (1 mg total) by mouth 2 (two) times daily.   [DISCONTINUED] losartan  (COZAAR ) 25 MG tablet Take 1 tablet (25 mg total) by mouth daily.   [DISCONTINUED] losartan  (COZAAR ) 50 MG tablet Take 1 tablet (50 mg total) by mouth daily.   [DISCONTINUED] nitroGLYCERIN  (NITROSTAT ) 0.4 MG SL tablet Place 1 tablet (0.4 mg total) under the tongue every 5 (five) minutes as needed  for chest pain.     Allergies:   Bee venom, Peanut-containing drug products, and Covid-19 (mrna) vaccine   Social History   Socioeconomic History   Marital status: Legally Separated    Spouse name: Not on file   Number of children: Not on file   Years of education: Not on file   Highest education level: Associate degree: occupational, scientist, product/process development, or vocational program  Occupational History   Not on file  Tobacco Use   Smoking status: Some Days    Types: Cigars   Smokeless tobacco: Never   Tobacco comments:    Couple of days .  Smokes when stressed.  Vaping Use   Vaping status: Every Day  Substance and Sexual Activity   Alcohol use: Not Currently    Comment: 1 Gallon/Day   Drug  use: Not Currently    Types: Crack cocaine, Fentanyl , Cocaine, Marijuana    Comment: Daily Use   Sexual activity: Not Currently    Comment: crack  Other Topics Concern   Not on file  Social History Narrative   Not on file   Social Drivers of Health   Financial Resource Strain: High Risk (11/25/2023)   Overall Financial Resource Strain (CARDIA)    Difficulty of Paying Living Expenses: Very hard  Food Insecurity: Food Insecurity Present (11/25/2023)   Hunger Vital Sign    Worried About Running Out of Food in the Last Year: Often true    Ran Out of Food in the Last Year: Often true  Transportation Needs: Unmet Transportation Needs (11/25/2023)   PRAPARE - Administrator, Civil Service (Medical): Yes    Lack of Transportation (Non-Medical): Yes  Physical Activity: Sufficiently Active (11/25/2023)   Exercise Vital Sign    Days of Exercise per Week: 7 days    Minutes of Exercise per Session: 30 min  Stress: Stress Concern Present (11/25/2023)   [employer] of Occupational Health - Occupational Stress Questionnaire    Feeling of Stress: Rather much  Social Connections: Moderately Isolated (11/25/2023)   Social Connection and Isolation Panel    Frequency of Communication with Friends and Family: Three times a week    Frequency of Social Gatherings with Friends and Family: Once a week    Attends Religious Services: More than 4 times per year    Active Member of [employer] or Organizations: No    Attends Engineer, Structural: Not on file    Marital Status: Separated     Family History: The patient's family history is not on file.  ROS:   Review of Systems  Constitution: Negative for decreased appetite, fever and weight gain.  HENT: Negative for congestion, ear discharge, hoarse voice and sore throat.   Eyes: Negative for discharge, redness, vision loss in right eye and visual halos.  Cardiovascular: Negative for chest pain, dyspnea on exertion, leg  swelling, orthopnea and palpitations.  Respiratory: Negative for cough, hemoptysis, shortness of breath and snoring.   Endocrine: Negative for heat intolerance and polyphagia.  Hematologic/Lymphatic: Negative for bleeding problem. Does not bruise/bleed easily.  Skin: Negative for flushing, nail changes, rash and suspicious lesions.  Musculoskeletal: Negative for arthritis, joint pain, muscle cramps, myalgias, neck pain and stiffness.  Gastrointestinal: Negative for abdominal pain, bowel incontinence, diarrhea and excessive appetite.  Genitourinary: Negative for decreased libido, genital sores and incomplete emptying.  Neurological: Negative for brief paralysis, focal weakness, headaches and loss of balance.  Psychiatric/Behavioral: Negative for altered mental status, depression and suicidal ideas.  Allergic/Immunologic:  Negative for HIV exposure and persistent infections.    EKGs/Labs/Other Studies Reviewed:    The following studies were reviewed today:   EKG:  The ekg ordered today demonstrates   Recent Labs: 03/03/2023: TSH 0.697 10/04/2023: ALT 17; BUN 14; Creatinine, Ser 1.02; Hemoglobin 14.4; Platelets 329; Potassium 4.2; Sodium 141  Recent Lipid Panel    Component Value Date/Time   CHOL 159 03/03/2023 1640   TRIG 65 03/03/2023 1640   HDL 39 (L) 03/03/2023 1640   CHOLHDL 4.1 03/03/2023 1640   VLDL 13 03/03/2023 1640   LDLCALC 107 (H) 03/03/2023 1640    Physical Exam:    VS:  BP (!) 146/86 (BP Location: Left Arm, Patient Position: Sitting, Cuff Size: Large)   Pulse 81   Ht 5' 10 (1.778 m)   SpO2 97%   BMI 37.59 kg/m     Wt Readings from Last 3 Encounters:  11/25/23 260 lb (117.9 kg)  11/16/23 259 lb (117.5 kg)  11/10/23 260 lb (117.9 kg)     GEN: Well nourished, well developed in no acute distress HEENT: Normal NECK: No JVD; No carotid bruits LYMPHATICS: No lymphadenopathy CARDIAC: S1S2 noted,RRR, no murmurs, rubs, gallops RESPIRATORY:  Clear to auscultation  without rales, wheezing or rhonchi  ABDOMEN: Soft, non-tender, non-distended, +bowel sounds, no guarding. EXTREMITIES: No edema, No cyanosis, no clubbing MUSCULOSKELETAL:  No deformity  SKIN: Warm and dry NEUROLOGIC:  Alert and oriented x 3, non-focal PSYCHIATRIC:  Normal affect, good insight  ASSESSMENT:    1. DOE (dyspnea on exertion)   2. Pre-procedural examination   3. Coronary artery disease involving native coronary artery of native heart, unspecified whether angina present   4. Primary hypertension   5. Mixed hyperlipidemia    PLAN:      Coronary artery disease with chest pain-any ischemic evaluation with his risk factors is best to pursue left heart catheterization in this patient.  The patient understands that risks include but are not limited to stroke (1 in 1000), death (1 in 1000), kidney failure [usually temporary] (1 in 500), bleeding (1 in 200), allergic reaction [possibly serious] (1 in 200), and agrees to proceed.  In the meantime we will continue his aspirin  81 mg daily, atorvastatin  40 mg daily.  He is hypertensive we will increase his losartan  to 50 mg daily.  Will add sublingual nitroglycerin  as needed.   With his dyspnea on exertion and sleep apnea we will get an echocardiogram to assess for any structural abnormalities and also look at the pressures on the right side of the heart.   Hyperlipidemia - continue with current statin medication.  Hypertension-as noted above blood pressure is elevated will increase losartan  to 25 mg daily.  The patient understands the need to lose weight with diet and exercise. We have discussed specific strategies for this.  The patient is in agreement with the above plan. The patient left the office in stable condition.  The patient will follow up in   Medication Adjustments/Labs and Tests Ordered: Current medicines are reviewed at length with the patient today.  Concerns regarding medicines are outlined above.  Orders Placed  This Encounter  Procedures   EKG 12-Lead   ECHOCARDIOGRAM COMPLETE   Meds ordered this encounter  Medications   DISCONTD: losartan  (COZAAR ) 50 MG tablet    Sig: Take 1 tablet (50 mg total) by mouth daily.    Dispense:  90 tablet    Refill:  3   DISCONTD: nitroGLYCERIN  (NITROSTAT ) 0.4 MG SL tablet  Sig: Place 1 tablet (0.4 mg total) under the tongue every 5 (five) minutes as needed for chest pain.    Dispense:  25 tablet    Refill:  5   nitroGLYCERIN  (NITROSTAT ) 0.4 MG SL tablet    Sig: Place 1 tablet (0.4 mg total) under the tongue every 5 (five) minutes as needed for chest pain (Up to three times).    Dispense:  25 tablet    Refill:  5   losartan  (COZAAR ) 50 MG tablet    Sig: Take 1 tablet (50 mg total) by mouth daily.    Dispense:  30 tablet    Refill:  3    Patient Instructions  Medication Instructions:  Your physician has recommended you make the following change in your medication:  As needed:  Nitroglycerin  0.4 mg place 1 tablet under your tongue every 5 minutes (up to three times) as needed for chest pain. INCREASE: Losartan  50 mg once daily  *If you need a refill on your cardiac medications before your next appointment, please call your pharmacy*   Testing/Procedures: Your physician has requested that you have an echocardiogram. Echocardiography is a painless test that uses sound waves to create images of your heart. It provides your doctor with information about the size and shape of your heart and how well your heart's chambers and valves are working. This procedure takes approximately one hour. There are no restrictions for this procedure. Please do NOT wear cologne, perfume, aftershave, or lotions (deodorant is allowed). Please arrive 15 minutes prior to your appointment time.  Please note: We ask at that you not bring children with you during ultrasound (echo/ vascular) testing. Due to room size and safety concerns, children are not allowed in the ultrasound rooms  during exams. Our front office staff cannot provide observation of children in our lobby area while testing is being conducted. An adult accompanying a patient to their appointment will only be allowed in the ultrasound room at the discretion of the ultrasound technician under special circumstances. We apologize for any inconvenience.   [facility] A DEPT OF [facility] HEARTCARE AT [address] A DEPT OF THE [place] [address] [place] [address] Dept: [phone] Loc: [phone]  KHAMERON GRUENWALD  01/03/2024  You are scheduled for a Cardiac Catheterization on Tuesday, December 9 with Dr. Peter Jordan.  1. Please arrive at the [facility] (Main Entrance A) at [facility]: [address] [place], [place] [address] at 5:30 AM (This time is 2 hour(s) before your procedure to ensure your preparation).   Free valet parking service is available. You will check in at ADMITTING. The support person will be asked to wait in the waiting room.  It is OK to have someone drop you off and come back when you are ready to be discharged.    Special note: Every effort is made to have your procedure done on time. Please understand that emergencies sometimes delay scheduled procedures.  2. Diet: Nothing to eat after midnight.   3. Hydration: You need to be well hydrated before your procedure. On December 9, you may drink approved liquids (see below) until 2 hours before the procedure, with 16 oz of water as your last intake.   List of approved liquids water, clear juice, clear tea, black coffee, fruit juices, non-citric and without pulp, carbonated beverages, Gatorade, Kool -Aid, plain Jello-O and plain ice popsicles.  4. Labs: You will not  need to have blood drawn.   5. Medication instructions in preparation for your procedure:   Contrast Allergy: No   Stop taking, Cozaar  (Losartan ) Dec 7th, has to be held 24 hours before procedure.    On the  morning of your procedure, take your Aspirin  81 mg and any morning medicines NOT listed above.  You may use sips of water.  6. Plan to go home the same day, you will only stay overnight if medically necessary. 7. Bring a current list of your medications and current insurance cards. 8. You MUST have a responsible person to drive you home. 9. Someone MUST be with you the first 24 hours after you arrive home or your discharge will be delayed. 10. Please wear clothes that are easy to get on and off and wear slip-on shoes.  Thank you for allowing us  to care for you!   -- [facility]   Follow-Up: At [facility], you and your health needs are our priority.  As part of our continuing mission to provide you with exceptional heart care, our providers are all part of one team.  This team includes your primary Cardiologist (physician) and Advanced Practice Providers or APPs (Physician Assistants and Nurse Practitioners) who all work together to provide you with the care you need, when you need it.  Your next appointment:   2-4 week(s)   Provider:   Rayjon Wery, DO  or APP   Other Instructions:     Adopting a Healthy Lifestyle.  Know what a healthy weight is for you (roughly BMI <25) and aim to maintain this   Aim for 7+ servings of fruits and vegetables daily   65-80+ fluid ounces of water or unsweet tea for healthy kidneys   Limit to max 1 drink of alcohol per day; avoid smoking/tobacco   Limit animal fats in diet for cholesterol and heart health - choose grass fed whenever available   Avoid highly processed foods, and foods high in saturated/trans fats   Aim for low stress - take time to unwind and care for your mental health   Aim for 150 min of moderate intensity exercise weekly for heart health, and weights twice weekly for bone health   Aim for 7-9 hours of sleep daily   When it comes to diets, agreement about the perfect plan isnt  easy to find, even among the experts. Experts at the [facility] [employer] developed an idea known as the Healthy Eating Plate. Just imagine a plate divided into logical, healthy portions.   The emphasis is on diet quality:   Load up on vegetables and fruits - one-half of your plate: Aim for color and variety, and remember that potatoes dont count.   Go for whole grains - one-quarter of your plate: Whole wheat, barley, wheat berries, quinoa, oats, brown rice, and foods made with them. If you want pasta, go with whole wheat pasta.   Protein power - one-quarter of your plate: Fish, chicken, beans, and nuts are all healthy, versatile protein sources. Limit red meat.   The diet, however, does go beyond the plate, offering a few other suggestions.   Use healthy plant oils, such as olive, canola, soy, corn, sunflower and peanut. Check the labels, and avoid partially hydrogenated oil, which have unhealthy trans fats.   If youre thirsty, drink water. Coffee and tea are good in moderation, but skip sugary drinks and limit milk and dairy products to one or two  daily servings.   The type of carbohydrate in the diet is more important than the amount. Some sources of carbohydrates, such as vegetables, fruits, whole grains, and beans-are healthier than others.   Finally, stay active  Signed, Dub Huntsman, DO  01/03/2024 9:56 AM    [facility]

## 2024-01-03 NOTE — Addendum Note (Signed)
 Addended by: SHEENA PUGH on: 01/03/2024 10:02 AM   Modules accepted: Orders

## 2024-01-03 NOTE — Patient Instructions (Signed)
 Medication Instructions:  Your physician has recommended you make the following change in your medication:  As needed:  Nitroglycerin  0.4 mg place 1 tablet under your tongue every 5 minutes (up to three times) as needed for chest pain. INCREASE: Losartan  50 mg once daily  *If you need a refill on your cardiac medications before your next appointment, please call your pharmacy*   Testing/Procedures: Your physician has requested that you have an echocardiogram. Echocardiography is a painless test that uses sound waves to create images of your heart. It provides your doctor with information about the size and shape of your heart and how well your heart's chambers and valves are working. This procedure takes approximately one hour. There are no restrictions for this procedure. Please do NOT wear cologne, perfume, aftershave, or lotions (deodorant is allowed). Please arrive 15 minutes prior to your appointment time.  Please note: We ask at that you not bring children with you during ultrasound (echo/ vascular) testing. Due to room size and safety concerns, children are not allowed in the ultrasound rooms during exams. Our front office staff cannot provide observation of children in our lobby area while testing is being conducted. An adult accompanying a patient to their appointment will only be allowed in the ultrasound room at the discretion of the ultrasound technician under special circumstances. We apologize for any inconvenience.   [place] [facility] A DEPT OF [facility] [place] HEARTCARE AT [address] A DEPT OF THE [place] [address] [place] [address] Dept: [phone] Loc: [phone]  PREM COYKENDALL  01/03/2024  You are scheduled for a Cardiac Catheterization on Tuesday, December 9 with Dr. Peter Jordan.  1. Please arrive at the [facility] (Main Entrance A) at [facility]: [address] [place], [place] [address] at 5:30 AM (This time is 2  hour(s) before your procedure to ensure your preparation).   Free valet parking service is available. You will check in at ADMITTING. The support person will be asked to wait in the waiting room.  It is OK to have someone drop you off and come back when you are ready to be discharged.    Special note: Every effort is made to have your procedure done on time. Please understand that emergencies sometimes delay scheduled procedures.  2. Diet: Nothing to eat after midnight.   3. Hydration: You need to be well hydrated before your procedure. On December 9, you may drink approved liquids (see below) until 2 hours before the procedure, with 16 oz of water as your last intake.   List of approved liquids water, clear juice, clear tea, black coffee, fruit juices, non-citric and without pulp, carbonated beverages, Gatorade, Kool -Aid, plain Jello-O and plain ice popsicles.  4. Labs: You will not need to have blood drawn.   5. Medication instructions in preparation for your procedure:   Contrast Allergy: No   Stop taking, Cozaar  (Losartan ) Dec 7th, has to be held 24 hours before procedure.    On the morning of your procedure, take your Aspirin  81 mg and any morning medicines NOT listed above.  You may use sips of water.  6. Plan to go home the same day, you will only stay overnight if medically necessary. 7. Bring a current list of your medications and current insurance cards. 8. You MUST have a responsible person to drive you home. 9. Someone MUST be with you the first 24 hours after you arrive home  or your discharge will be delayed. 10. Please wear clothes that are easy to get on and off and wear slip-on shoes.  Thank you for allowing us  to care for you!   -- [place] [facility]   Follow-Up: At [facility], you and your health needs are our priority.  As part of our continuing mission to provide you with exceptional heart care, our providers are all part  of one team.  This team includes your primary Cardiologist (physician) and Advanced Practice Providers or APPs (Physician Assistants and Nurse Practitioners) who all work together to provide you with the care you need, when you need it.  Your next appointment:   2-4 week(s)   Provider:   Kardie Tobb, DO  or APP   Other Instructions:
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
## 2024-01-05 ENCOUNTER — Ambulatory Visit: Admitting: Physician Assistant

## 2024-01-05 ENCOUNTER — Encounter: Payer: Self-pay | Admitting: Physician Assistant

## 2024-01-05 VITALS — BP 134/80 | HR 87 | Ht 70.0 in | Wt 267.0 lb

## 2024-01-05 DIAGNOSIS — R1310 Dysphagia, unspecified: Secondary | ICD-10-CM

## 2024-01-05 DIAGNOSIS — R933 Abnormal findings on diagnostic imaging of other parts of digestive tract: Secondary | ICD-10-CM

## 2024-01-05 DIAGNOSIS — K625 Hemorrhage of anus and rectum: Secondary | ICD-10-CM

## 2024-01-05 DIAGNOSIS — K2100 Gastro-esophageal reflux disease with esophagitis, without bleeding: Secondary | ICD-10-CM

## 2024-01-05 NOTE — Patient Instructions (Addendum)
 You have been scheduled for a Barium Esophogram at [facility] (1st floor of the hospital) on December 18th at 9:00am. Please arrive 30 minutes prior to your appointment for registration. Make certain not to have anything to eat or drink 3 hours prior to your test. If you need to reschedule for any reason, please contact radiology at [phone] to do so. __________________________________________________________________ A barium swallow is an examination that concentrates on views of the esophagus. This tends to be a double contrast exam (barium and two liquids which, when combined, create a gas to distend the wall of the oesophagus) or single contrast (non-ionic iodine based). The study is usually tailored to your symptoms so a good history is essential. Attention is paid during the study to the form, structure and configuration of the esophagus, looking for functional disorders (such as aspiration, dysphagia, achalasia, motility and reflux) EXAMINATION You may be asked to change into a gown, depending on the type of swallow being performed. A radiologist and radiographer will perform the procedure. The radiologist will advise you of the type of contrast selected for your procedure and direct you during the exam. You will be asked to stand, sit or lie in several different positions and to hold a small amount of fluid in your mouth before being asked to swallow while the imaging is performed .In some instances you may be asked to swallow barium coated marshmallows to assess the motility of a solid food bolus. The exam can be recorded as a digital or video fluoroscopy procedure. POST PROCEDURE It will take 1-2 days for the barium to pass through your system. To facilitate this, it is important, unless otherwise directed, to increase your fluids for the next 24-48hrs and to resume your normal diet.  This test typically takes about 30 minutes to  perform. __________________________________________________________________________________   Please go to the lab in the basement of our building to pick up a kit to do a fecal calprotectin stool test as you leave today. Hit B for basement when you get on the elevator.  When the doors open the lab is on your left.  We will call you with the results. Thank you.  You have been scheduled for a follow up appointment on Tuesday, 02-21-24 at 8:20 am.  If you need to reschedule please call as soon as possible:  [phone]  Thank you for entrusting me with your care and for choosing [facility], [place], [place]   _______________________________________________________  If your blood pressure at your visit was 140/90 or greater, please contact your primary care physician to follow up on this.  _______________________________________________________  If you are age 56 or older, your body mass index should be between 23-30. Your Body mass index is 38.31 kg/m. If this is out of the aforementioned range listed, please consider follow up with your Primary Care Provider.  If you are age 56 or younger, your body mass index should be between 19-25. Your Body mass index is 38.31 kg/m. If this is out of the aformentioned range listed, please consider follow up with your Primary Care Provider.   ________________________________________________________  The [facility] providers would like to encourage you to use MYCHART to communicate with providers for non-urgent requests or questions.  Due to long hold times on the telephone, sending your provider a message by [facility] may be a faster and more efficient way to get a response.  Please allow 48 business hours for a response.  Please remember that this is for non-urgent  requests.  _______________________________________________________  [facility] is using a team-based approach to care.  Your team is made up of your doctor and two to  three APPS. Our APPS (Nurse Practitioners and Physician Assistants) work with your physician to ensure care continuity for you. They are fully qualified to address your health concerns and develop a treatment plan. They communicate directly with your gastroenterologist to care for you. Seeing the Advanced Practice Practitioners on your physician's team can help you by facilitating care more promptly, often allowing for earlier appointments, access to diagnostic testing, procedures, and other specialty referrals.

## 2024-01-06 ENCOUNTER — Other Ambulatory Visit

## 2024-01-06 ENCOUNTER — Ambulatory Visit: Admitting: *Deleted

## 2024-01-06 ENCOUNTER — Other Ambulatory Visit (HOSPITAL_COMMUNITY): Payer: Self-pay

## 2024-01-06 ENCOUNTER — Telehealth: Payer: Self-pay | Admitting: *Deleted

## 2024-01-06 DIAGNOSIS — K625 Hemorrhage of anus and rectum: Secondary | ICD-10-CM

## 2024-01-06 DIAGNOSIS — R933 Abnormal findings on diagnostic imaging of other parts of digestive tract: Secondary | ICD-10-CM

## 2024-01-06 NOTE — Telephone Encounter (Signed)
 Pt was here for RN visit for BP check. Stated he had PFT's done; wants to discuss result with Dr Elicia; stated he was told he has fluid around his lungs. Stated he's scheduled for heart cath on the 9th.

## 2024-01-06 NOTE — Progress Notes (Signed)
    Raymond Mcclure presented today for blood pressure check. Patient is prescribed blood pressure medications and I confirmed that patient did not take their blood pressure medication prior to today's appointment. Blood pressure was taken in the usual and appropriate manner using an automated BP cuff.     Vitals:   01/06/24 1027 01/06/24 1034  BP: (!) 134/93 (!) 139/95      Results of today's visit will be routed to Dr. Elicia and [facility] for review and further management.

## 2024-01-09 ENCOUNTER — Telehealth: Payer: Self-pay | Admitting: *Deleted

## 2024-01-09 NOTE — Telephone Encounter (Signed)
 Cardiac Catheterization scheduled at [facility] for: Tuesday January 10, 2024 7:30 AM Arrival time [facility] Main Entrance A at: 5:30 AM  Diet: -Nothing to eat after midnight.  Hydration: -May drink clear liquids until 2 hours before the procedure.  Approved liquids: Water , clear tea, black coffee, fruit juices-non-citric and without pulp,Gatorade, plain Jello/popsicles.   -Please drink 16 oz of water  2 hours before procedure.  Medication instructions: -Usual morning medications can be taken including aspirin  81 mg.  Plan to go home the same day, you will only stay overnight if medically necessary.  You must have responsible adult to drive you home.  Someone must be with you the first 24 hours after you arrive home.  Reviewed procedure instructions with patient.

## 2024-01-10 ENCOUNTER — Ambulatory Visit (HOSPITAL_COMMUNITY)
Admission: RE | Admit: 2024-01-10 | Discharge: 2024-01-10 | Disposition: A | Attending: Cardiology | Admitting: Cardiology

## 2024-01-10 ENCOUNTER — Encounter (HOSPITAL_COMMUNITY): Payer: Self-pay | Admitting: Cardiology

## 2024-01-10 ENCOUNTER — Encounter (HOSPITAL_COMMUNITY)
Admission: RE | Disposition: A | Discharge status: Home / Self Care | Payer: Self-pay | Source: Home / Self Care | Attending: Cardiology

## 2024-01-10 ENCOUNTER — Other Ambulatory Visit: Payer: Self-pay

## 2024-01-10 ENCOUNTER — Other Ambulatory Visit (HOSPITAL_COMMUNITY): Payer: Self-pay

## 2024-01-10 DIAGNOSIS — R079 Chest pain, unspecified: Secondary | ICD-10-CM

## 2024-01-10 DIAGNOSIS — I2510 Atherosclerotic heart disease of native coronary artery without angina pectoris: Secondary | ICD-10-CM

## 2024-01-10 HISTORY — PX: LEFT HEART CATH AND CORONARY ANGIOGRAPHY: CATH118249

## 2024-01-10 SURGERY — LEFT HEART CATH AND CORONARY ANGIOGRAPHY
Anesthesia: LOCAL

## 2024-01-10 MED ORDER — FENTANYL CITRATE (PF) 100 MCG/2ML IJ SOLN
INTRAMUSCULAR | Status: DC | PRN
  Administered 2024-01-10: 25 ug via INTRAVENOUS

## 2024-01-10 MED ORDER — LIDOCAINE HCL (PF) 1 % IJ SOLN
INTRAMUSCULAR | Status: AC
  Filled 2024-01-10: qty 30

## 2024-01-10 MED ORDER — ONDANSETRON HCL 4 MG/2ML IJ SOLN: 4.0000 mg | Freq: Four times a day (QID) | INTRAMUSCULAR | Status: DC | PRN

## 2024-01-10 MED ORDER — SODIUM CHLORIDE 0.9% FLUSH: 3.0000 mL | INTRAVENOUS | Status: DC | PRN

## 2024-01-10 MED ORDER — VERAPAMIL HCL 2.5 MG/ML IV SOLN
INTRAVENOUS | Status: AC
  Filled 2024-01-10: qty 2

## 2024-01-10 MED ORDER — ASPIRIN 81 MG PO CHEW: 81.0000 mg | CHEWABLE_TABLET | ORAL | Status: DC

## 2024-01-10 MED ORDER — MIDAZOLAM HCL 2 MG/2ML IJ SOLN
INTRAMUSCULAR | Status: AC
  Filled 2024-01-10: qty 2

## 2024-01-10 MED ORDER — SODIUM CHLORIDE 0.9% FLUSH: 3.0000 mL | Freq: Two times a day (BID) | INTRAVENOUS | Status: DC

## 2024-01-10 MED ORDER — VERAPAMIL HCL 2.5 MG/ML IV SOLN
INTRAVENOUS | Status: DC | PRN
  Administered 2024-01-10: 10 mL via INTRA_ARTERIAL

## 2024-01-10 MED ORDER — LIDOCAINE HCL (PF) 1 % IJ SOLN
INTRAMUSCULAR | Status: DC | PRN
  Administered 2024-01-10: 5 mL via INTRADERMAL

## 2024-01-10 MED ORDER — MIDAZOLAM HCL (PF) 2 MG/2ML IJ SOLN
INTRAMUSCULAR | Status: DC | PRN
  Administered 2024-01-10: 1 mg via INTRAVENOUS

## 2024-01-10 MED ORDER — HEPARIN SODIUM (PORCINE) 1000 UNIT/ML IJ SOLN
INTRAMUSCULAR | Status: DC | PRN
  Administered 2024-01-10: 6000 [IU] via INTRAVENOUS

## 2024-01-10 MED ORDER — FREE WATER: 500.0000 mL | Freq: Once | Status: DC

## 2024-01-10 MED ORDER — HYDRALAZINE HCL 20 MG/ML IJ SOLN: 10.0000 mg | INTRAMUSCULAR | Status: DC | PRN

## 2024-01-10 MED ORDER — FREE WATER: 250.0000 mL | Freq: Once | Status: DC

## 2024-01-10 MED ORDER — FENTANYL CITRATE (PF) 100 MCG/2ML IJ SOLN
INTRAMUSCULAR | Status: AC
  Filled 2024-01-10: qty 2

## 2024-01-10 MED ORDER — SODIUM CHLORIDE 0.9 % IV SOLN: 250.0000 mL | INTRAVENOUS | Status: DC | PRN

## 2024-01-10 MED ORDER — ACETAMINOPHEN 325 MG PO TABS: 650.0000 mg | ORAL_TABLET | ORAL | Status: DC | PRN

## 2024-01-10 MED ORDER — HEPARIN SODIUM (PORCINE) 1000 UNIT/ML IJ SOLN
INTRAMUSCULAR | Status: AC
  Filled 2024-01-10: qty 10

## 2024-01-10 MED ORDER — FUROSEMIDE 20 MG PO TABS
20.0000 mg | ORAL_TABLET | Freq: Every day | ORAL | 11 refills | Status: DC
  Filled 2024-01-10: qty 30, 30d supply, fill #0

## 2024-01-10 MED ORDER — HEPARIN (PORCINE) IN NACL 1000-0.9 UT/500ML-% IV SOLN
INTRAVENOUS | Status: DC | PRN
  Administered 2024-01-10 (×2): 500 mL

## 2024-01-10 MED ORDER — IOHEXOL 350 MG/ML SOLN
INTRAVENOUS | Status: DC | PRN
  Administered 2024-01-10: 70 mL

## 2024-01-10 SURGICAL SUPPLY — 9 items
CATH 5FR JL3.5 JR4 ANG PIG MP (CATHETERS) IMPLANT
CATH LAUNCHER 5F RADR (CATHETERS) IMPLANT
DEVICE RAD COMP TR BAND LRG (VASCULAR PRODUCTS) IMPLANT
GLIDESHEATH SLEND SS 6F .021 (SHEATH) IMPLANT
GUIDEWIRE INQWIRE 1.5J.035X260 (WIRE) IMPLANT
KIT SYRINGE INJ CVI SPIKEX1 (MISCELLANEOUS) IMPLANT
PACK CARDIAC CATHETERIZATION (CUSTOM PROCEDURE TRAY) ×1 IMPLANT
SET ATX-X65L (MISCELLANEOUS) IMPLANT
SHEATH PROBE COVER 6X72 (BAG) IMPLANT

## 2024-01-10 NOTE — Discharge Instructions (Addendum)
 Start lasix  20 mg daily for fluid   Drink plenty of fluid for 48 hours and keep wrist elevated at heart level for 24 hours  Radial Site Care   This sheet gives you information about how to care for yourself after your procedure. Your health care provider may also give you more specific instructions. If you have problems or questions, contact your health care provider. What can I expect after the procedure? After the procedure, it is common to have: Bruising and tenderness at the catheter insertion area. Follow these instructions at home: Medicines Take over-the-counter and prescription medicines only as told by your health care provider. Insertion site care Follow instructions from your health care provider about how to take care of your insertion site. Make sure you: Wash your hands with soap and water  before you change your bandage (dressing). If soap and water  are not available, use hand sanitizer. remove your dressing as told by your health care provider. In 24 hours Check your insertion site every day for signs of infection. Check for: Redness, swelling, or pain. Fluid or blood. Pus or a bad smell. Warmth. Do not take baths, swim, or use a hot tub until your health care provider approves. You may shower 24-48 hours after the procedure, or as directed by your health care provider. Remove the dressing and gently wash the site with plain soap and water . Pat the area dry with a clean towel. Do not rub the site. That could cause bleeding. Do not apply powder or lotion to the site. Activity   For 24 hours after the procedure, or as directed by your health care provider: Do not flex or bend the affected arm. Do not push or pull heavy objects with the affected arm. Do not drive yourself home from the hospital or clinic. You may drive 24 hours after the procedure unless your health care provider tells you not to. Do not operate machinery or power tools. Do not lift anything that is  heavier than 10 lb (4.5 kg), or the limit that you are told, until your health care provider says that it is safe. For 4 days Ask your health care provider when it is okay to: Return to work or school. Resume usual physical activities or sports. Resume sexual activity. General instructions If the catheter site starts to bleed, raise your arm and put firm pressure on the site. If the bleeding does not stop, get help right away. This is a medical emergency. If you went home on the same day as your procedure, a responsible adult should be with you for the first 24 hours after you arrive home. Keep all follow-up visits as told by your health care provider. This is important. Contact a health care provider if: You have a fever. You have redness, swelling, or yellow drainage around your insertion site. Get help right away if: You have unusual pain at the radial site. The catheter insertion area swells very fast. The insertion area is bleeding, and the bleeding does not stop when you hold steady pressure on the area. Your arm or hand becomes pale, cool, tingly, or numb. These symptoms may represent a serious problem that is an emergency. Do not wait to see if the symptoms will go away. Get medical help right away. Call your local emergency services (911 in the [place] Do not drive yourself to the hospital. Summary After the procedure, it is common to have bruising and tenderness at the site. Follow instructions from your health care  provider about how to take care of your radial site wound. Check the wound every day for signs of infection. Do not lift anything that is heavier than 10 lb (4.5 kg), or the limit that you are told, until your health care provider says that it is safe. This information is not intended to replace advice given to you by your health care provider. Make sure you discuss any questions you have with your health care provider. Document Revised: 02/23/2017 Document Reviewed:  02/23/2017 Elsevier Patient Education  2020 [employer].

## 2024-01-10 NOTE — Progress Notes (Signed)
 Patient and patient daughter given discharge instructions, education provided no further questions at this time. Patient able to ambulate before discharge. Able to tolerate PO intake. Patient did develop small hematoma, pressure held and hematoma resolved, charge RN at beside, no further hematoma noted after 10:30 am. Patient site is clean, dry, intact with no hematoma noted upon discharge.

## 2024-01-10 NOTE — Interval H&P Note (Signed)
 History and Physical Interval Note:  01/10/2024 7:07 AM  Fairy JONETTA Sharps  has presented today for surgery, with the diagnosis of CAD w/ CP.  The various methods of treatment have been discussed with the patient and family. After consideration of risks, benefits and other options for treatment, the patient has consented to  Procedure(s): LEFT HEART CATH AND CORONARY ANGIOGRAPHY (N/A) as a surgical intervention.  The patient's history has been reviewed, patient examined, no change in status, stable for surgery.  I have reviewed the patient's chart and labs.  Questions were answered to the patient's satisfaction.   Cath Lab Visit (complete for each Cath Lab visit)  Clinical Evaluation Leading to the Procedure:   ACS: No.  Non-ACS:    Anginal Classification: CCS II  Anti-ischemic medical therapy: No Therapy  Non-Invasive Test Results: No non-invasive testing performed  Prior CABG: No previous CABG        Maude [facility] 01/10/2024 7:07 AM

## 2024-01-11 ENCOUNTER — Other Ambulatory Visit: Payer: Self-pay

## 2024-01-11 ENCOUNTER — Ambulatory Visit: Payer: Self-pay | Admitting: Physician Assistant

## 2024-01-11 ENCOUNTER — Emergency Department (HOSPITAL_COMMUNITY)

## 2024-01-11 ENCOUNTER — Emergency Department (HOSPITAL_COMMUNITY): Admission: EM | Admit: 2024-01-11 | Discharge: 2024-01-11 | Discharge status: Against Medical Advice

## 2024-01-11 ENCOUNTER — Emergency Department (HOSPITAL_COMMUNITY): Admit: 2024-01-11 | Discharge: 2024-01-11 | Disposition: A | Discharge status: Home / Self Care

## 2024-01-11 DIAGNOSIS — M25531 Pain in right wrist: Secondary | ICD-10-CM

## 2024-01-11 DIAGNOSIS — Z5321 Procedure and treatment not carried out due to patient leaving prior to being seen by health care provider: Secondary | ICD-10-CM | POA: Diagnosis not present

## 2024-01-11 DIAGNOSIS — R0789 Other chest pain: Secondary | ICD-10-CM | POA: Diagnosis present

## 2024-01-11 LAB — CBC
HCT: 46.4 % (ref 39.0–52.0)
Hemoglobin: 15.9 g/dL (ref 13.0–17.0)
MCH: 32.4 pg (ref 26.0–34.0)
MCHC: 34.3 g/dL (ref 30.0–36.0)
MCV: 94.7 fL (ref 80.0–100.0)
Platelets: 318 K/uL (ref 150–400)
RBC: 4.9 MIL/uL (ref 4.22–5.81)
RDW: 12.7 % (ref 11.5–15.5)
WBC: 10.8 K/uL — ABNORMAL HIGH (ref 4.0–10.5)
nRBC: 0 % (ref 0.0–0.2)

## 2024-01-11 LAB — BASIC METABOLIC PANEL WITH GFR
Anion gap: 9 (ref 5–15)
BUN: 14 mg/dL (ref 6–20)
CO2: 22 mmol/L (ref 22–32)
Calcium: 8.5 mg/dL — ABNORMAL LOW (ref 8.9–10.3)
Chloride: 105 mmol/L (ref 98–111)
Creatinine, Ser: 0.97 mg/dL (ref 0.61–1.24)
GFR, Estimated: >60 mL/min (ref >60)
Glucose, Bld: 98 mg/dL (ref 70–99)
Potassium: 4.2 mmol/L (ref 3.5–5.1)
Sodium: 136 mmol/L (ref 135–145)

## 2024-01-11 LAB — TROPONIN I (HIGH SENSITIVITY): Troponin I (High Sensitivity): 18 ng/L — ABNORMAL HIGH (ref <18)

## 2024-01-11 LAB — CALPROTECTIN, FECAL: Calprotectin, Fecal: 10 ug/g (ref 0–120)

## 2024-01-11 MED ORDER — OXYCODONE-ACETAMINOPHEN 5-325 MG PO TABS
1.0000 | ORAL_TABLET | Freq: Once | ORAL | Status: AC
  Administered 2024-01-11: 1 via ORAL
  Filled 2024-01-11: qty 1

## 2024-01-11 NOTE — ED Triage Notes (Signed)
 Patient had a heart cath in yesterday, he states he didn't get any stents they just told him about the fluid around his heart.

## 2024-01-11 NOTE — ED Notes (Signed)
 Patient transported to X-ray

## 2024-01-11 NOTE — ED Triage Notes (Signed)
 Patient bib [facility] from home with complaints of chest tightness that also is tight in his left arm. Patient denies shob. He found out that he had fluid around his heart yesterday and was sent home with lasix  to start this morning. One hour after he started the medications is when the chest tightness started.

## 2024-01-11 NOTE — Progress Notes (Signed)
 Right radial artery surveillance has been completed. Preliminary results can be found in CV Proc through chart review.  Results were given to Dr. Ula.  01/11/24 3:23 PM Cathlyn Collet RVT

## 2024-01-11 NOTE — ED Provider Triage Note (Signed)
 Emergency Medicine Provider Triage Evaluation Note  Raymond Mcclure , a 58 y.o. male  was evaluated in triage.  Pt complains of right wrist pain after cardiac catheterization yesterday.  The patient states that he has been having ongoing chest tightness as well as some intermittent dizziness which is why he had a catheterization done yesterday.  He states that he had some of this this morning but this has since resolved.  He denies any associated chest pain.  He states he is having pain in his right wrist around the site where he had the catheter placed yesterday.  Reports some mild swelling to the area.  Review of Systems  Positive: See above Negative: Chest pain  Physical Exam  BP 134/87 (BP Location: Right Arm)   Pulse 83   Temp 98.2 F (36.8 C) (Oral)   Resp 18   SpO2 97%  Gen:   Awake, no distress   Resp:  Normal effort  MSK:   Moves extremities without difficulty  Other:  There is mild swelling noted to the distal right wrist with no active bleeding noted, there is a blister noted over the skin, 2+ radial pulses bilaterally  Medical Decision Making  Medically screening exam initiated at 12:39 PM.  Appropriate orders placed.  Raymond Mcclure was informed that the remainder of the evaluation will be completed by another provider, this initial triage assessment does not replace that evaluation, and the importance of remaining in the ED until their evaluation is complete.  Will further evaluate the patient here with basic labs to evaluate for anemia or electrolyte abnormalities.  The patient declines any imaging for the chest tightness and did not want an x-ray with this being a chronic issue.  Ultrasound be ordered to evaluate for hematoma or radial artery aneurysm from the catheterization yesterday.  He does seem to have good distal pulses and blood flow to the hand.   Raymond Prentice SAUNDERS, MD 01/11/24 [phone]

## 2024-01-17 ENCOUNTER — Ambulatory Visit (HOSPITAL_BASED_OUTPATIENT_CLINIC_OR_DEPARTMENT_OTHER): Attending: Adult Health | Admitting: Pulmonary Disease

## 2024-01-17 DIAGNOSIS — G4733 Obstructive sleep apnea (adult) (pediatric): Secondary | ICD-10-CM | POA: Insufficient documentation

## 2024-01-19 ENCOUNTER — Inpatient Hospital Stay (HOSPITAL_COMMUNITY)
Admission: RE | Admit: 2024-01-19 | Discharge: 2024-01-19 | Attending: Physician Assistant | Admitting: Physician Assistant

## 2024-01-19 ENCOUNTER — Ambulatory Visit (HOSPITAL_COMMUNITY): Admission: RE | Admit: 2024-01-19 | Discharge: 2024-01-19 | Attending: Family Medicine | Admitting: Family Medicine

## 2024-01-19 DIAGNOSIS — K449 Diaphragmatic hernia without obstruction or gangrene: Secondary | ICD-10-CM | POA: Diagnosis not present

## 2024-01-19 DIAGNOSIS — J45909 Unspecified asthma, uncomplicated: Secondary | ICD-10-CM | POA: Diagnosis present

## 2024-01-19 DIAGNOSIS — J9811 Atelectasis: Secondary | ICD-10-CM | POA: Insufficient documentation

## 2024-01-19 DIAGNOSIS — R1310 Dysphagia, unspecified: Secondary | ICD-10-CM | POA: Diagnosis present

## 2024-01-19 DIAGNOSIS — K224 Dyskinesia of esophagus: Secondary | ICD-10-CM | POA: Diagnosis not present

## 2024-01-19 DIAGNOSIS — R918 Other nonspecific abnormal finding of lung field: Secondary | ICD-10-CM | POA: Diagnosis not present

## 2024-01-19 DIAGNOSIS — K2100 Gastro-esophageal reflux disease with esophagitis, without bleeding: Secondary | ICD-10-CM | POA: Insufficient documentation

## 2024-01-23 ENCOUNTER — Ambulatory Visit: Payer: Self-pay | Admitting: Student

## 2024-01-25 ENCOUNTER — Other Ambulatory Visit

## 2024-01-29 NOTE — Progress Notes (Unsigned)
 "      OFFICE NOTE:    Date:  01/30/2024  ID:  Raymond Mcclure, DOB April 06, 1965, MRN [id number] PCP: Elicia Sharper, DO  [facility] Providers Cardiologist:  Dub Huntsman, DO        Chest pain  LHC 08/02/19 [facility]): no CAD  LHC 01/10/24: No CAD, Normal LVF, LVEDP 30 (HFpEF) heart failure with preserved ejection fraction  Supraventricular Tachycardia s/p RF ablation  S/p ILR - subsequently explanted  Hypertension  Hyperlipidemia  OSA Polysubstance abuse Cocaine use, Tobacco use, Alcohol abuse GERD Bipolar disorder         Discussed the use of AI scribe software for clinical note transcription with the patient, who gave verbal consent to proceed. History of Present Illness Raymond Mcclure is a 58 y.o. male for post hospital follow up. He was last seen by Dr. Huntsman 01/03/24. Pt had concerning chest pain and dyspnea on exertion. He was set up for cardiac catheterization, echocardiogram. Echocardiogram is still pending. The cardiac catheterization demonstrated no CAD, normal LV function and elevated LVEDP. He was started on Furosemide  30 mg once daily. He went to the ED the day after his cardiac catheterization with wrist pain and chest pain. His Troponin was 18. US  on his wrist was unremarkable. He left without being seen.   He experiences persistent chest pain located in the chest and radiating to the back, which worsens with exertion and changes in position. The pain is described as a 'gas pocket' and is present all day long. Nitroglycerin  provides temporary relief, but the pain returns after a few hours.  He experiences episodes of rapid heartbeats both at rest and with activity, such as walking in the mall. These episodes occur every other day or whenever he engages in physical activity. He describes feeling his heart racing and sometimes feels dizzy and tired during these episodes. His home blood pressure readings range from 156/90 to 170/95. He experiences frequent indigestion and  takes Protonix  daily. He has changed his diet to avoid starchy foods and rice. Despite these changes, he continues to experience indigestion and believes it might be related to gas. He has a history of polysubstance abuse, including cocaine, tobacco, and alcohol, but currently denies using these substances. No significant swelling in his legs is noted, and he can walk up a flight of stairs, although he becomes short of breath. He believes the furosemide  has helped improve his breathing slightly.    ROS-See HPI    Studies Reviewed:       LABS 02/04/23: TC 159, Trig 65, HDL 39, LDL 107, TSH 0.697 10/04/23: ALT 17 01/11/24: K 4.2, SCr 0.97, Hgb 15.9, PLT 318K  HYPERTENSION CONTROL Vitals:   01/30/24 0819 01/30/24 0910  BP: (!) 136/90 (!) 144/90    The patient's blood pressure is elevated above target today.  In order to address the patient's elevated BP: A new medication was prescribed today.         Physical Exam:  VS:  BP (!) 144/90   Pulse 85   Ht 5' 10 (1.778 m)   Wt 270 lb (122.5 kg)   SpO2 97%   BMI 38.74 kg/m        Wt Readings from Last 3 Encounters:  01/30/24 270 lb (122.5 kg)  01/17/24 260 lb (117.9 kg)  01/10/24 267 lb (121.1 kg)    Constitutional:      Appearance: Healthy appearance. Not in distress.  Neck:     Vascular: No  JVR. JVD normal.  Pulmonary:     Breath sounds: Normal breath sounds. No wheezing. No rales.  Chest:     Chest wall: Tender to palpatation.  Cardiovascular:     Normal rate. Regular rhythm.     Murmurs: There is no murmur.  Edema:    Peripheral edema absent.  Abdominal:     Palpations: Abdomen is soft.       Assessment and Plan:    Assessment & Plan Chronic heart failure with preserved ejection fraction (HFpEF) (HCC) Pt underwent recent cardiac catheterization with no CAD. LVEDP was elevated and he was started on Furosemide . Echocardiogram is scheduled for 02/14/24. Volume status seems stable on exam. He is NYHA II-IIb. He  continues to have chest pain. However, his breathing seems to be better since starting on Furosemide .  - TTE pending 02/14/24 - Continue Furosemide  20 mg once daily - BMET, BNP today. - Adjust Furosemide  if BNP elevated. - If BNP high, consider SGLT2 inhib, MRA. - Follow up 3 mos  Precordial chest pain DOE (dyspnea on exertion) As noted, recent cardiac catheterization showed no CAD. He describes chest pain that sounds c/w MSK pain. Chest pain is reproducible with palpation. He has an elevated R hemidiaphragm on recent CT. This may be contributing to shortness of breath. He sees pulmonology tomorrow. He has frequent indigestion and is on Pantoprazole  already. He has seen GI and is pending EGD/colonoscopy. Of note, based upon his recent cardiac catheterization, he can proceed with EGD/colo at acceptable risk. He does note improved symptoms with prn NTG. His symptoms do not sound c/w with vasospasm, however, given improvement with NTG and his uncontrolled BP, will start Amlodipine .  - Follow up pulmonology tomorrow - Follow up with GI as planned - Alternate ice/heat to chest and take Ibuprofen  400-600 mg three times a day x 3 days - Start Amlodipine  5 mg once daily  - Follow up with Dr. Sheena in 3 mos  Palpitations He has a hx of Supraventricular Tachycardia ablation. He notes occasional rapid palpitations.  - Obtain Zio XT to evaluate rapid palpitations.  Primary hypertension Blood pressure remains uncontrolled with readings ranging from 156/90 to 170/95 at home.  - Start amlodipine  5 mg daily - Continue Losartan  50 mg once daily          Dispo:  Return in about 3 months (around 04/29/2024) for Routine Follow Up w/ Dr. Sheena.  Signed, Glendia Ferrier, PA-C   "

## 2024-01-30 ENCOUNTER — Encounter: Payer: Self-pay | Admitting: Physician Assistant

## 2024-01-30 ENCOUNTER — Ambulatory Visit: Attending: Physician Assistant

## 2024-01-30 ENCOUNTER — Other Ambulatory Visit (HOSPITAL_COMMUNITY): Payer: Self-pay

## 2024-01-30 ENCOUNTER — Ambulatory Visit: Attending: Physician Assistant | Admitting: Physician Assistant

## 2024-01-30 VITALS — BP 144/90 | HR 85 | Ht 70.0 in | Wt 270.0 lb

## 2024-01-30 DIAGNOSIS — R0609 Other forms of dyspnea: Secondary | ICD-10-CM

## 2024-01-30 DIAGNOSIS — R002 Palpitations: Secondary | ICD-10-CM

## 2024-01-30 DIAGNOSIS — I5032 Chronic diastolic (congestive) heart failure: Secondary | ICD-10-CM

## 2024-01-30 DIAGNOSIS — R072 Precordial pain: Secondary | ICD-10-CM

## 2024-01-30 DIAGNOSIS — I10 Essential (primary) hypertension: Secondary | ICD-10-CM

## 2024-01-30 LAB — BASIC METABOLIC PANEL WITH GFR
BUN/Creatinine Ratio: 13 (ref 9–20)
BUN: 14 mg/dL (ref 6–24)
CO2: 23 mmol/L (ref 20–29)
Calcium: 9.6 mg/dL (ref 8.7–10.2)
Chloride: 106 mmol/L (ref 96–106)
Creatinine, Ser: 1.1 mg/dL (ref 0.76–1.27)
Glucose: 94 mg/dL (ref 70–99)
Potassium: 5 mmol/L (ref 3.5–5.2)
Sodium: 143 mmol/L (ref 134–144)
eGFR: 79 mL/min/1.73

## 2024-01-30 LAB — PRO B NATRIURETIC PEPTIDE: NT-Pro BNP: 60 pg/mL (ref 0–210)

## 2024-01-30 MED ORDER — AMLODIPINE BESYLATE 5 MG PO TABS
5.0000 mg | ORAL_TABLET | Freq: Every day | ORAL | 11 refills | Status: DC
  Filled 2024-01-30: qty 30, 30d supply, fill #0

## 2024-01-30 NOTE — Progress Notes (Unsigned)
 Enrolled for [employer] to mail a ZIO XT long term holter monitor to the patients address on file.   Dr. Harriet Masson to read.

## 2024-01-30 NOTE — Patient Instructions (Addendum)
 Medication Instructions:  START TAKING AMLODIPINE  5 MG DAILY. MAY TAKE OVER-THE-COUNTER IBUPROFEN  600 MG AS NEEDED FOR PAIN FOR THE NEXT 2-3 DAYS THEN STOP.   Lab Work: NUTRITIONAL THERAPIST AND BNP TO BE DONE TODAY.  Testing/Procedures: GEOFFRY HEWS- Long Term Monitor Instructions  Your physician has requested you wear a ZIO patch monitor for 14 days.  This is a single patch monitor. [employer] supplies one patch monitor per enrollment. Additional stickers are not available. Please do not apply patch if you will be having a Nuclear Stress Test,  Echocardiogram, Cardiac CT, MRI, or Chest Xray during the period you would be wearing the  monitor. The patch cannot be worn during these tests. You cannot remove and re-apply the  ZIO XT patch monitor.  Your ZIO patch monitor will be mailed 3 day USPS to your address on file. It may take 3-5 days  to receive your monitor after you have been enrolled.  Once you have received your monitor, please review the enclosed instructions. Your monitor  has already been registered assigning a specific monitor serial # to you.  Billing and Patient Assistance Program Information  We have supplied [employer] with any of your insurance information on file for billing purposes. [employer] offers a sliding scale Patient Assistance Program for patients that do not have  insurance, or whose insurance does not completely cover the cost of the ZIO monitor.  You must apply for the Patient Assistance Program to qualify for this discounted rate.  To apply, please call [employer] at [phone], select option 4, select option 2, ask to apply for  Patient Assistance Program. Meredeth will ask your household income, and how many people  are in your household. They will quote your out-of-pocket cost based on that information.  [employer] will also be able to set up a 72-month, interest-free payment plan if needed.  Applying the monitor   Shave hair from upper left chest.  Hold abrader disc by orange tab.  Rub abrader in 40 strokes over the upper left chest as  indicated in your monitor instructions.  Clean area with 4 enclosed alcohol pads. Let dry.  Apply patch as indicated in monitor instructions. Patch will be placed under collarbone on left  side of chest with arrow pointing upward.  Rub patch adhesive wings for 2 minutes. Remove white label marked 1. Remove the white  label marked 2. Rub patch adhesive wings for 2 additional minutes.  While looking in a mirror, press and release button in center of patch. A small green light will  flash 3-4 times. This will be your only indicator that the monitor has been turned on.  Do not shower for the first 24 hours. You may shower after the first 24 hours.  Press the button if you feel a symptom. You will hear a small click. Record Date, Time and  Symptom in the Patient Logbook.  When you are ready to remove the patch, follow instructions on the last 2 pages of Patient  Logbook. Stick patch monitor onto the last page of Patient Logbook.  Place Patient Logbook in the blue and white box. Use locking tab on box and tape box closed  securely. The blue and white box has prepaid postage on it. Please place it in the mailbox as  soon as possible. Your physician should have your test results approximately 7 days after the  monitor has been mailed back to [facility].  Call [facility] Customer Care at [phone] if you have questions regarding  your ZIO XT patch monitor. Call them immediately if you see an orange light blinking on your  monitor.  If your monitor falls off in less than 4 days, contact our Monitor department at [phone].  If your monitor becomes loose or falls off after 4 days call [employer] at [phone] for  suggestions on securing your monitor   Follow-Up: At [facility], you and your health needs are our priority.  As part of our continuing mission to provide you with exceptional heart care, our providers  are all part of one team.  This team includes your primary Cardiologist (physician) and Advanced Practice Providers or APPs (Physician Assistants and Nurse Practitioners) who all work together to provide you with the care you need, when you need it.  Your next appointment:   3 MONTHS  Provider:   Kardie Tobb, DO     Other Instructions:  MAY ROTATE ICE PACKS WITH HEAT PACKS ON AREA WHERE YOU ARE EXPERIENCING PAIN.

## 2024-01-30 NOTE — Assessment & Plan Note (Signed)
 Blood pressure remains uncontrolled with readings ranging from 156/90 to 170/95 at home.  - Start amlodipine  5 mg daily - Continue Losartan  50 mg once daily

## 2024-01-31 ENCOUNTER — Ambulatory Visit: Payer: Self-pay | Admitting: Physician Assistant

## 2024-01-31 ENCOUNTER — Encounter: Payer: Self-pay | Admitting: Adult Health

## 2024-01-31 ENCOUNTER — Ambulatory Visit (INDEPENDENT_AMBULATORY_CARE_PROVIDER_SITE_OTHER): Admitting: Adult Health

## 2024-01-31 VITALS — BP 144/90 | HR 77 | Temp 97.6°F | Ht 70.0 in | Wt 273.6 lb

## 2024-01-31 DIAGNOSIS — R053 Chronic cough: Secondary | ICD-10-CM

## 2024-01-31 DIAGNOSIS — J4530 Mild persistent asthma, uncomplicated: Secondary | ICD-10-CM | POA: Diagnosis not present

## 2024-01-31 DIAGNOSIS — E669 Obesity, unspecified: Secondary | ICD-10-CM

## 2024-01-31 DIAGNOSIS — J309 Allergic rhinitis, unspecified: Secondary | ICD-10-CM

## 2024-01-31 DIAGNOSIS — G4733 Obstructive sleep apnea (adult) (pediatric): Secondary | ICD-10-CM | POA: Diagnosis not present

## 2024-01-31 DIAGNOSIS — R918 Other nonspecific abnormal finding of lung field: Secondary | ICD-10-CM | POA: Diagnosis not present

## 2024-01-31 DIAGNOSIS — Z6839 Body mass index (BMI) 39.0-39.9, adult: Secondary | ICD-10-CM

## 2024-01-31 DIAGNOSIS — K219 Gastro-esophageal reflux disease without esophagitis: Secondary | ICD-10-CM | POA: Diagnosis not present

## 2024-01-31 DIAGNOSIS — Z87891 Personal history of nicotine dependence: Secondary | ICD-10-CM

## 2024-01-31 DIAGNOSIS — F17200 Nicotine dependence, unspecified, uncomplicated: Secondary | ICD-10-CM

## 2024-01-31 DIAGNOSIS — Z23 Encounter for immunization: Secondary | ICD-10-CM

## 2024-01-31 NOTE — Procedures (Signed)
 [facility]ryle [facility] [address] [place], [place] [address] Tel: [phone]   Fax: [phone]  Split Night Interpretation  Patient Name:  Raymond Mcclure, Raymond Mcclure Date:  01/17/2024 Referring Physician:  MADELIN PARRETT [phone]) %%startinterp%% Indications for Polysomnography The patient is a 58 year old Male who is 5' 10 and weighs 260.0 lbs.  His BMI equals 37.6.  A diagnostic polysomnogram was performed to evaluate for -.  After 137.0 minutes of sleep time the patient exhibited sufficient respiratory events qualifying him for a CPAP trial which was then initiated.    Medications taken at 2135.  ATORVASTATIN   RISPERIDONE    Polysomnogram Data A full night polysomnogram was performed recording the standard physiologic parameters including EEG, EOG, EMG, EKG, nasal and oral airflow.  Respiratory parameters of chest and abdominal movements are recorded with Piezo-Crystal motion transducers.  Oxygen saturation was recorded by pulse oximetry.    Sleep Architecture The total recording time of the diagnostic portion of the study was 186.7 minutes.  The total sleep time was 137.0 minutes.  During the diagnostic portion of the study, the patient spent 2.2% of total sleep time in Stage N1, 61.3% in Stage N2, 4.4% in Stages N3, and 32.1% in REM.   Sleep latency was 46.7 minutes.  REM latency was 68.5 minutes.  Sleep Efficiency was 73.4%.  Wake after Sleep Onset time was 3.0 minutes.   At 12:49:25 AM the patient was placed on PAP treatment and was titrated at pressures ranging from 6* cm/H20 with supplemental oxygen at - up to 17* cm/H20 with supplemental oxygen at -.  The total recording time of the treatment portion of the study was 256.6 minutes.  The total sleep time was 254.5 minutes.  During the treatment portion of the study, the patient spent 1.0% of total sleep time in Stage N1, 57.8% in Stage N2, 3.1% in Stages N3, and 38.1% in REM.   Sleep latency was 0.0 minutes.  REM  latency was 72.5 minutes.  Sleep Efficiency was 99.2%.  Wake after Sleep Onset time was 2.0 minutes.  Respiratory Events During the diagnostic portion of the study, the polysomnogram revealed a presence of 21 obstructive, 3 central, and 7 mixed apneas resulting in an Apnea index of 13.6 events per hour.  There were 63 hypopneas (>=3% desaturation and/or arousal) resulting in an Apnea\Hypopnea Index (AHI >=3% desaturation and/or arousal) of 41.2 events per hour.  There were 39 hypopneas (>=4% desaturation) resulting in an Apnea\Hypopnea Index (AHI >=4% desaturation) of 30.7 events per hour.  There were 13 Respiratory Effort Related Arousals resulting in a RERA index of 5.7 events per hour. The Respiratory Disturbance Index is 46.9 events per hour.  The snore index was 554.9 events per hour.  Mean oxygen saturation was 94.1%.  The lowest oxygen saturation during sleep was 82.0%.  Time spent <=88% oxygen saturation was 6.5 minutes (3.5%).  During the treatment portion of the study, the polysomnogram revealed a presence of 3 obstructive, 9 central, and 2 mixed apneas resulting in an Apnea index of 3.3 events per hour.  There were 24 hypopneas (>=3% desaturation and/or arousal) resulting in an Apnea\Hypopnea Index (AHI >=3% desaturation and/or arousal) of 9.0 events per hour.  There were 8 hypopneas (>=4% desaturation) resulting in an Apnea\Hypopnea Index (AHI >=4% desaturation) of 5.2 events per hour.  There were 12 Respiratory Effort Related Arousals resulting in a RERA index of 2.8 events per hour. The Respiratory Disturbance Index is 11.8 events per hour.  The snore  index was 323.7 events per hour.  Mean oxygen saturation was 94.8%.  The lowest oxygen saturation during sleep was 89.0%.  Time spent <=88% oxygen saturation was - minutes (-).  Limb Activity During the diagnostic portion of the study, there were - limb movements recorded.    During the treatment portion of the study, there were - limb movements  recorded.    Cardiac Summary During the diagnostic portion of the study, the average pulse rate was 78.3 bpm.  The minimum pulse rate was 57.0 bpm while the maximum pulse rate was 113.0 bpm.  During the treatment portion of the study, the average pulse rate was 89.1 bpm.  The minimum pulse rate was 67.0 bpm while the maximum pulse rate was 119.0 bpm.   Comments : The patient met split night criteria and was started on CPAP at a pressure of 6 cm H2O and was increased to a pressure of 17 cm H2O. The patient tolerated the pressures well. A large F&P Simplus full-face mask was used during the titration. Good REM rebound was noted with CPAP  Diagnosis: Severe OSA corrected by CPAP 17 cm  Recommendations: Initiate CPAP 17 cm with large F& P simplus mask Alternatively, autoCPAP 12-17 cm can be used for better pressure tolerance   This study was personally reviewed and electronically signed by: JUDE HARDEN GAILS, MD Accredited Board Certified in Sleep Medicine  01/31/24

## 2024-01-31 NOTE — Progress Notes (Signed)
 "  @Patient  ID: Raymond Mcclure, male    DOB: 01-13-1966, 58 y.o.   MRN: [id number]  Chief Complaint  Patient presents with   Obstructive Sleep Apnea    Referring provider: Elicia Sharper, DO  HPI: 58 year old male former smoker (quit October 2025) seen for sleep consult October 2025 to establish for sleep apnea Medical history significant for hypertension, polysubstance abuse(marijuana and cocaine), schizoaffective disorder and asthma, SVT status post ablation 2023 Previously diagnosed with sleep apnea in the past was on CPAP briefly Disabled   TEST/EVENTS : Reviewed 01/31/2024  PFT 2025 mild restriction and decreased diffusing capacity, no airflow obstruction High-resolution CT chest January 19, 2024 scattered pulmonary nodules largest measuring 0.5 cm right upper lobe, negative for ILD, chronic mild to moderate elevation of the right hemidiaphragm, right basilar atelectasis  Discussed the use of AI scribe software for clinical note transcription with the patient, who gave verbal consent to proceed.  History of Present Illness Raymond Mcclure is a 58 year old male with severe sleep apnea who presents for a follow-up regarding his sleep apnea.  Patient was seen last visit for sleep consult. He was experiencing snoring, restless sleep and daytime sleepiness and fatigue.  He was set up for sleep study Sleep study on January 17, 2024, which revealed severe obstructive sleep apnea with approximately 30 episodes per hour-oxygen desaturation to 82%.  Optimal control on CPAP 17 cm H2O or 12-17cmH2O.  He had been on CPAP therapy before but was still experiencing issues so discontinued years ago.   He has a persistent cough for last few month. Has history of asthma   He experiences postnasal drainage on daily basis. Does have heartburn despite taking Protonix  daily.   He has a significant smoking history, having smoked up to two packs per day for 35 years, starting at age 36. He has not smoked  since his last visit for 2 months.  He uses a Breo inhaler once daily.  He endorses compliance.  Recent high-resolution CT chest showed no evidence of interstitial lung disease.  Stable small lung nodules.  And chronic right hemidiaphragm elevation.   He also mentions a previous experience with a colonoscopy where he stopped breathing. Leading to a code blue situation. He is concerned about undergoing another colonoscopy due to this past experience.     Allergies[1]  Immunization History  Administered Date(s) Administered   Hep A / Hep B 03/18/2009, 02/23/2011, 11/02/2011   PFIZER(Purple Top)SARS-COV-2 Vaccination 05/25/2019, 06/18/2019   Tdap 03/18/2009, 02/06/2019    Past Medical History:  Diagnosis Date   Alcohol use disorder    Anxiety    Cluster headaches    Depression    Drug abuse (HCC)    GERD (gastroesophageal reflux disease)    History of colon polyps    Hyperlipidemia    Hypertension    IBS (irritable bowel syndrome)    Migraines    Mild CAD    Obesity    OSA (obstructive sleep apnea)    Schizophrenia (HCC)    Tachycardia     Tobacco History: Tobacco Use History[2] Counseling given: Not Answered Tobacco comments: Quit smoking mid October 2025   Outpatient Medications Prior to Visit  Medication Sig Dispense Refill   amLODipine  (NORVASC ) 5 MG tablet Take 1 tablet (5 mg total) by mouth daily. 30 tablet 11   aspirin  EC 81 MG tablet Take 1 tablet (81 mg total) by mouth daily. Swallow whole. 150 tablet 2   atorvastatin  (LIPITOR) 40 MG  tablet TAKE ONE TABLET BY MOUTH AT BEDTIME *NEW PRESCRIPTION REQUEST* 90 tablet 3   escitalopram  (LEXAPRO ) 10 MG tablet Take 1 tablet (10 mg total) by mouth daily. 90 tablet 0   fluticasone  furoate-vilanterol (BREO ELLIPTA ) 100-25 MCG/ACT AEPB Inhale 1 puff into the lungs daily. 90 each 3   furosemide  (LASIX ) 20 MG tablet Take 1 tablet (20 mg total) by mouth daily. 30 tablet 11   hydrOXYzine  (ATARAX ) 25 MG tablet Take 1 tablet (25  mg total) by mouth 3 (three) times daily as needed for anxiety. 90 tablet 2   losartan  (COZAAR ) 50 MG tablet Take 1 tablet (50 mg total) by mouth daily. 30 tablet 3   naltrexone  (DEPADE) 50 MG tablet Take 1 tablet (50 mg total) by mouth daily. 90 tablet 0   nitroGLYCERIN  (NITROSTAT ) 0.4 MG SL tablet Place 1 tablet (0.4 mg total) under the tongue every 5 (five) minutes as needed for chest pain (Up to three times). 25 tablet 5   ondansetron  (ZOFRAN -ODT) 4 MG disintegrating tablet Take 1 tablet (4 mg total) by mouth every 8 (eight) hours as needed for nausea or vomiting. 15 tablet 0   pantoprazole  (PROTONIX ) 40 MG tablet Take 1 tablet (40 mg total) by mouth daily. 90 tablet 3   risperiDONE  (RISPERDAL ) 1 MG tablet Take 1 tablet (1 mg total) by mouth 2 (two) times daily. 180 tablet 0   Varenicline  Tartrate, Starter, (CHANTIX  STARTING MONTH PAK) 0.5 MG X 11 & 1 MG X 42 TBPK Take 1 mg by mouth 2 (two) times daily. 53 each 1   No facility-administered medications prior to visit.     Review of Systems:   Constitutional:   No  weight loss, night sweats,  Fevers, chills, +fatigue, or  lassitude.  HEENT:   No headaches,  Difficulty swallowing,  Tooth/dental problems, or  Sore throat,                No sneezing, itching, ear ache, +nasal congestion, post nasal drip,   CV:  No chest pain,  Orthopnea, PND, swelling in lower extremities, anasarca, dizziness, palpitations, syncope.   GI  No heartburn, indigestion, abdominal pain, nausea, vomiting, diarrhea, change in bowel habits, loss of appetite, bloody stools.   Resp: .  No chest wall deformity  Skin: no rash or lesions.  GU: no dysuria, change in color of urine, no urgency or frequency.  No flank pain, no hematuria   MS:  No joint pain or swelling.  No decreased range of motion.  No back pain.    Physical Exam  BP (!) 148/92   Pulse 77   Temp 97.6 F (36.4 C)   Ht 5' 10 (1.778 m) Comment: Per pt  Wt 273 lb 9.6 oz (124.1 kg)   SpO2 98%  Comment: RA  BMI 39.26 kg/m   GEN: A/Ox3; pleasant , NAD, well nourished    HEENT:  [place]/AT,   NOSE-clear, THROAT-clear, no lesions, no postnasal drip or exudate noted.   NECK:  Supple w/ fair ROM; no JVD; normal carotid impulses w/o bruits; no thyromegaly or nodules palpated; no lymphadenopathy.    RESP  Clear  P & A; w/o, wheezes/ rales/ or rhonchi. no accessory muscle use, no dullness to percussion  CARD:  RRR, no m/r/g, no peripheral edema, pulses intact, no cyanosis or clubbing.  GI:   Soft & nt; nml bowel sounds; no organomegaly or masses detected.   Musco: Warm bil, no deformities or joint swelling noted.  Neuro: alert, no focal deficits noted.    Skin: Warm, no lesions or rashes    Lab Results:Reviewed 01/31/2024   CBC    Component Value Date/Time   WBC 10.8 (H) 01/11/2024 1222   RBC 4.90 01/11/2024 1222   HGB 15.9 01/11/2024 1222   HGB 14.6 09/14/2021 0914   HCT 46.4 01/11/2024 1222   HCT 43.3 09/14/2021 0914   PLT 318 01/11/2024 1222   PLT 350 09/14/2021 0914   MCV 94.7 01/11/2024 1222   MCV 94 09/14/2021 0914   MCH 32.4 01/11/2024 1222   MCHC 34.3 01/11/2024 1222   RDW 12.7 01/11/2024 1222   RDW 12.3 09/14/2021 0914   LYMPHSABS 2.9 03/03/2023 1640   MONOABS 1.0 03/03/2023 1640   EOSABS 0.1 03/03/2023 1640   BASOSABS 0.0 03/03/2023 1640    BMET    Component Value Date/Time   NA 143 01/30/2024 0924   K 5.0 01/30/2024 0924   CL 106 01/30/2024 0924   CO2 23 01/30/2024 0924   GLUCOSE 94 01/30/2024 0924   GLUCOSE 98 01/11/2024 1222   BUN 14 01/30/2024 0924   CREATININE 1.10 01/30/2024 0924   CALCIUM  9.6 01/30/2024 0924   GFRNONAA >60 01/11/2024 1222   GFRAA >60 08/23/2014 0858    BNP    Component Value Date/Time   BNP 26.5 01/05/2021 1603    ProBNP    Component Value Date/Time   PROBNP 60 01/30/2024 0924   PROBNP <30.0 07/14/2006 0415    Imaging: Sleep Study Documents Result Date: 01/25/2024 Ordered by an unspecified provider.  CT  Chest High Resolution Result Date: 01/20/2024 EXAM: HIGH RESOLUTION CHEST 01/19/2024 08:21:37 AM TECHNIQUE: CT of the chest was performed without the administration of intravenous contrast. High resolution CT imaging was performed of the lungs. Multiplanar reformatted images are provided for review. Automated exposure control, iterative reconstruction, and/or weight based adjustment of the mA/kV was utilized to reduce the radiation dose to as low as reasonably achievable. High resolution CT images were performed in the supine inspiration, supine expiration, and prone inspiration positions. COMPARISON: Chest radiograph dated 09/10/2023. CLINICAL HISTORY: PFTs showing evidence of potential fibrosis or interstitial process. FINDINGS: MEDIASTINUM AND LYMPH NODES: Heart and pericardium are unremarkable. No mediastinal, hilar or axillary lymphadenopathy. HRCT FINDINGS AND LUNGS AND PLEURA: No pneumothorax. No pleural effusion. The central airways are clear. No acute consolidative airspace disease. No lung masses. No significant lobular air trapping or evidence of tracheobronchomalacia on the expiration sequence. Several scattered solid pulmonary nodules in the lungs bilaterally, largest at 0.5 cm in the right upper lobe on series 11, image 55, not previously imaged. Mild to moderate elevation of the right hemidiaphragm, chronic and unchanged. Mild passive atelectasis at the posterior right lung base. No significant regions of subpleural reticulation, ground glass opacity, traction bronchiectasis, architectural distortion or frank honeycombing. UPPER ABDOMEN: Hypodense posterior right liver lesion is too small to characterize and unchanged, considered benign. Simple bilateral renal cysts, largest 3.3 cm in the posterior interpolar right kidney. Per consensus, no follow-up is needed for simple Bosniak type 1 and 2 renal cysts, unless the patient has a malignancy history or risk factors. SOFT TISSUES AND BONES: Moderate  thoracic spondylosis. No acute abnormality of the soft tissues. IMPRESSION: 1. Several scattered solid pulmonary nodules in the lungs bilaterally, largest 0.5 cm in the right upper lobe, not previously imaged. No follow-up chest CT recommended per Fleischner criteria. 2. No evidence of interstitial lung disease. 3. Chronic mild to moderate elevation of the right  hemidiaphragm. Mild passive atelectasis at the posterior right lung base. Electronically signed by: Selinda Blue MD 01/20/2024 12:40 PM EST RP Workstation: [id number]   DG ESOPHAGUS W DOUBLE CM (HD) Result Date: 01/19/2024 CLINICAL DATA:  Dysphagia unspecified. Food getting stuck in mid esophagus. EXAM: ESOPHOGRAM / BARIUM SWALLOW / BARIUM TABLET STUDY TECHNIQUE: Combined double contrast and single contrast examination performed using effervescent crystals, thick barium liquid, and thin barium liquid. The patient was observed with fluoroscopy swallowing a 13 mm barium sulphate tablet. FLUOROSCOPY: Radiation Exposure Index (as provided by the fluoroscopic device): 33.9 mGy COMPARISON:  today's chest CT, dictated separately FINDINGS: Evaluation of the hypopharynx demonstrates osteophyte induced impression about the posterior esophagus at the approximately C6-7 level. This is suboptimally evaluated secondary to patient size. Double contrast evaluation of the esophagus demonstrates no mucosal abnormality. Evaluation of esophageal peristalsis demonstrates proximal escape waves and contrast stasis in the upper esophagus. Full column evaluation of the esophagus demonstrates a tiny hiatal hernia. No persistent narrowing to suggest stricture. Mild gastroesophageal reflux with water  swallows to the lower esophagus. A 13 mm barium tablet passes promptly. IMPRESSION: Mild esophageal dysmotility, likely presbyesophagus. Mild gastroesophageal reflux with water  swallows. Tiny hiatal hernia. Electronically Signed   By: Rockey Kilts M.D.   On: 01/19/2024 10:09   Split  night study Result Date: 01/17/2024 Jude Harden GAILS, MD     01/31/2024  2:30 AM [facility] [address] [place], [place] [address] Tel: [phone]   Fax: [phone] Split Night Interpretation Patient Name:  Raymond Mcclure, Raymond Mcclure Date:  01/17/2024 Referring Physician:  MADELIN Devaris Quirk [phone]) %%startinterp%% Indications for Polysomnography The patient is a 58 year old Male who is 5' 10 and weighs 260.0 lbs.  His BMI equals 37.6.  A diagnostic polysomnogram was performed to evaluate for -.  After 137.0 minutes of sleep time the patient exhibited sufficient respiratory events qualifying him for a CPAP trial which was then initiated.  Medications taken at 2135. ATORVASTATIN  RISPERIDONE  Polysomnogram Data A full night polysomnogram was performed recording the standard physiologic parameters including EEG, EOG, EMG, EKG, nasal and oral airflow.  Respiratory parameters of chest and abdominal movements are recorded with Piezo-Crystal motion transducers.  Oxygen saturation was recorded by pulse oximetry.  Sleep Architecture The total recording time of the diagnostic portion of the study was 186.7 minutes.  The total sleep time was 137.0 minutes.  During the diagnostic portion of the study, the patient spent 2.2% of total sleep time in Stage N1, 61.3% in Stage N2, 4.4% in Stages N3, and 32.1% in REM.   Sleep latency was 46.7 minutes.  REM latency was 68.5 minutes.  Sleep Efficiency was 73.4%.  Wake after Sleep Onset time was 3.0 minutes. At 12:49:25 AM the patient was placed on PAP treatment and was titrated at pressures ranging from 6* cm/H20 with supplemental oxygen at - up to 17* cm/H20 with supplemental oxygen at -.  The total recording time of the treatment portion of the study was 256.6 minutes.  The total sleep time was 254.5 minutes.  During the treatment portion of the study, the patient spent 1.0% of total sleep time in Stage N1, 57.8% in Stage N2, 3.1% in Stages N3, and 38.1%  in REM.   Sleep latency was 0.0 minutes.  REM latency was 72.5 minutes.  Sleep Efficiency was 99.2%.  Wake after Sleep Onset time was 2.0 minutes. Respiratory Events During the diagnostic portion of the study, the polysomnogram revealed a presence of 21 obstructive,  3 central, and 7 mixed apneas resulting in an Apnea index of 13.6 events per hour.  There were 63 hypopneas (>=3% desaturation and/or arousal) resulting in an Apnea\Hypopnea Index (AHI >=3% desaturation and/or arousal) of 41.2 events per hour.  There were 39 hypopneas (>=4% desaturation) resulting in an Apnea\Hypopnea Index (AHI >=4% desaturation) of 30.7 events per hour.  There were 13 Respiratory Effort Related Arousals resulting in a RERA index of 5.7 events per hour. The Respiratory Disturbance Index is 46.9 events per hour.  The snore index was 554.9 events per hour.  Mean oxygen saturation was 94.1%.  The lowest oxygen saturation during sleep was 82.0%.  Time spent <=88% oxygen saturation was 6.5 minutes (3.5%). During the treatment portion of the study, the polysomnogram revealed a presence of 3 obstructive, 9 central, and 2 mixed apneas resulting in an Apnea index of 3.3 events per hour.  There were 24 hypopneas (>=3% desaturation and/or arousal) resulting in an Apnea\Hypopnea Index (AHI >=3% desaturation and/or arousal) of 9.0 events per hour.  There were 8 hypopneas (>=4% desaturation) resulting in an Apnea\Hypopnea Index (AHI >=4% desaturation) of 5.2 events per hour.  There were 12 Respiratory Effort Related Arousals resulting in a RERA index of 2.8 events per hour. The Respiratory Disturbance Index is 11.8 events per hour.  The snore index was 323.7 events per hour.  Mean oxygen saturation was 94.8%.  The lowest oxygen saturation during sleep was 89.0%.  Time spent <=88% oxygen saturation was - minutes (-). Limb Activity During the diagnostic portion of the study, there were - limb movements recorded.  During the treatment portion of the  study, there were - limb movements recorded.  Cardiac Summary During the diagnostic portion of the study, the average pulse rate was 78.3 bpm.  The minimum pulse rate was 57.0 bpm while the maximum pulse rate was 113.0 bpm. During the treatment portion of the study, the average pulse rate was 89.1 bpm.  The minimum pulse rate was 67.0 bpm while the maximum pulse rate was 119.0 bpm. Comments : The patient met split night criteria and was started on CPAP at a pressure of 6 cm H2O and was increased to a pressure of 17 cm H2O. The patient tolerated the pressures well. A large F&P Simplus full-face mask was used during the titration. Good REM rebound was noted with CPAP Diagnosis: Severe OSA corrected by CPAP 17 cm Recommendations: Initiate CPAP 17 cm with large F& P simplus mask Alternatively, autoCPAP 12-17 cm can be used for better pressure tolerance This study was personally reviewed and electronically signed by: JUDE HARDEN GAILS, MD Accredited Board Certified in Sleep Medicine  VAS US  UPPER EXTREMITY ARTERIAL DUPLEX Result Date: 01/11/2024  UPPER EXTREMITY DUPLEX STUDY Patient Name:  Raymond Mcclure  Date of Exam:   01/11/2024 Medical Rec #: [id number]       Accession #:    [id number] Date of Birth: 08-Mar-1965       Patient Gender: M Patient Age:   61 years Exam Location:  [facility] Procedure:      VAS US  UPPER EXTREMITY ARTERIAL DUPLEX Referring Phys: PRENTICE TEE --------------------------------------------------------------------------------  Indications: Wrist pain. History:     Patient has a history of catheterization via right radial artery.  Risk Factors: Hypertension. Comparison Study: No prior studies. Performing Technologist: Cordella Collet RVT  Examination Guidelines: A complete evaluation includes B-mode imaging, spectral Doppler, color Doppler, and power Doppler as needed of all accessible portions of each vessel. Bilateral testing is  considered an integral part of a complete examination.  Limited examinations for reoccurring indications may be performed as noted.  Right Doppler Findings: +-----------+----------+---------+--------+--------+ Site       PSV (cm/s)Waveform StenosisComments +-----------+----------+---------+--------+--------+                                                +-----------+----------+---------+--------+--------+ Radial Prox          triphasic                 +-----------+----------+---------+--------+--------+ Radial Mid           triphasic                 +-----------+----------+---------+--------+--------+ Radial Dist          triphasic                 +-----------+----------+---------+--------+--------+ Ulnar Dist           triphasic                 +-----------+----------+---------+--------+--------+ No obvious evidence of hematoma, occlusion, or pseudoaneurysm in the right radial artery.   Summary:  Right: No obvious evidence of hematoma, occlusion, or pseudoaneurysm        in the right radial artery. *See table(s) above for measurements and observations. Electronically signed by Debby Robertson on 01/11/2024 at 10:15:12 PM.    Final    CARDIAC CATHETERIZATION Result Date: 01/10/2024   The left ventricular systolic function is normal.   LV end diastolic pressure is moderately elevated.   The left ventricular ejection fraction is 55-65% by visual estimate. Left dominant circulation Normal coronary anatomy Normal LV function High LV EDP 30 mm Hg Plan: will start lasix  20 mg daily. Optimize medication for diastolic dysfunction.    Administration History     None          Latest Ref Rng & Units 11/04/2023    8:43 AM  PFT Results  FVC-Pre L 3.73   FVC-Predicted Pre % 76   FVC-Post L 3.88   FVC-Predicted Post % 79   Pre FEV1/FVC % % 83   Post FEV1/FCV % % 84   FEV1-Pre L 3.09   FEV1-Predicted Pre % 82   FEV1-Post L 3.28   DLCO uncorrected ml/min/mmHg 17.22   DLCO UNC% % 60   DLVA Predicted % 72   TLC L 5.31   TLC %  Predicted % 76   RV % Predicted % 64     No results found for: NITRICOXIDE     11/16/2023    8:00 AM  Results of the Epworth flowsheet  Sitting and reading 3  Watching TV 3  Sitting, inactive in a public place (e.g. a theatre or a meeting) 3  As a passenger in a car for an hour without a break 3  Lying down to rest in the afternoon when circumstances permit 3  Sitting and talking to someone 2  Sitting quietly after a lunch without alcohol 2  In a car, while stopped for a few minutes in traffic 3  Total score 22        Assessment & Plan:   Assessment and Plan Assessment & Plan Obstructive sleep apnea  -severe obstructive sleep apnea with significant daily symptom burden He has severe obstructive sleep apnea with approximately 30 episodes per hour, causing oxygen desaturation  to 82%.  Previously intolerant to CPAP.   CPAP therapy is recommended begin auto CPAP 12 to 17 cm H2O.  He was instructed on CPAP maintenance, including daily cleaning of the water  chamber, using distilled water , weekly rinsing of tubing, and daily wiping of the mask. A follow-up is scheduled in two months for CPAP evaluation. - discussed how weight can impact sleep and risk for sleep disordered breathing - discussed options to assist with weight loss: combination of diet modification, cardiovascular and strength training exercises   - had an extensive discussion regarding the adverse health consequences related to untreated sleep disordered breathing - specifically discussed the risks for hypertension, coronary artery disease, cardiac dysrhythmias, cerebrovascular disease, and diabetes - lifestyle modification discussed   - discussed how sleep disruption can increase risk of accidents, particularly when driving - safe driving practices were discussed     Asthma -mild persistent currently managed with a Breo inhaler once daily. A recent CT scan showed no  interstitial lung disease. He should continue  using the Breo inhaler once daily.  Albuterol  as needed.  Continue on trigger prevention.  Add in Zyrtec and Flonase also control for reflux triggers added Pepcid  20 mg along with his PPI therapy  Allergic rhinitis-mild flare Begin Zyrtec and Flonase daily.  Saline nasal spray as needed  Chronic cough  -ongoing High-resolution CT chest negative for ILD. He has a persistent cough with postnasal drainage and heartburn.  Not on ACE inhibitor  . Over-the-counter Delsym or Robitussin DM cough syrup is recommended, two teaspoons twice daily As needed  Zyrtec has been started at bedtime, along with Flonase. Protonix  should be continued daily, and Pepcid  has been added at bedtime.  Gastroesophageal reflux disease  -ongoing uncontrolled  He experiences persistent heartburn despite using Protonix , and GERD may contribute to the chronic cough. Protonix  should be continued daily, and Pepcid  has been added at bedtime.  Lung nodules-stable on recent high-resolution CT chest.  Patient has a history of smoking.  Going forward should refer to the lung cancer CT screening program which will be due in December 2026  Obesity-healthy weight loss  discussed BMI 39.      Madelin Stank, NP 01/31/2024       [1]  Allergies Allergen Reactions   Bee Venom Anaphylaxis   Peanut-Containing Drug Products Anaphylaxis and Swelling   Covid-19 (Mrna) Vaccine Other (See Comments)    Blacked out after 1st vaccine; after 2nd vaccine, syncope again  [2]  Social History Tobacco Use  Smoking Status Former   Types: Cigars  Smokeless Tobacco Never  Tobacco Comments   Quit smoking mid October 2025   "

## 2024-01-31 NOTE — Patient Instructions (Signed)
 Begin CPAP At bedtime, wear all night long  Work on healthy weight  Do not drive if sleepy  Use caution with sedating medications  Continue on BREO 1 puff daily.  Delsym 2 tsp Twice daily  for cough As needed   Begin Zyrtec 10mg  At bedtime   Begin Flonase 2 puffs daily  Continue Protonix  daily  Add Pepcid  20mg   At bedtime    Follow up in 2 months with Dr. Olena or Snow Peoples NP and As needed

## 2024-02-01 ENCOUNTER — Emergency Department (HOSPITAL_COMMUNITY)
Admission: EM | Admit: 2024-02-01 | Discharge: 2024-02-02 | Discharge status: Against Medical Advice | Attending: Emergency Medicine | Admitting: Emergency Medicine

## 2024-02-01 ENCOUNTER — Encounter (HOSPITAL_COMMUNITY): Payer: Self-pay | Admitting: Emergency Medicine

## 2024-02-01 ENCOUNTER — Emergency Department (HOSPITAL_COMMUNITY)

## 2024-02-01 ENCOUNTER — Other Ambulatory Visit: Payer: Self-pay

## 2024-02-01 DIAGNOSIS — I10 Essential (primary) hypertension: Secondary | ICD-10-CM | POA: Diagnosis not present

## 2024-02-01 DIAGNOSIS — I2510 Atherosclerotic heart disease of native coronary artery without angina pectoris: Secondary | ICD-10-CM | POA: Diagnosis not present

## 2024-02-01 DIAGNOSIS — R0789 Other chest pain: Secondary | ICD-10-CM | POA: Diagnosis present

## 2024-02-01 DIAGNOSIS — Z5321 Procedure and treatment not carried out due to patient leaving prior to being seen by health care provider: Secondary | ICD-10-CM | POA: Insufficient documentation

## 2024-02-01 LAB — CBC WITH DIFFERENTIAL/PLATELET
Abs Immature Granulocytes: 0.05 K/uL (ref 0.00–0.07)
Basophils Absolute: 0 K/uL (ref 0.0–0.1)
Basophils Relative: 0 %
Eosinophils Absolute: 0.3 K/uL (ref 0.0–0.5)
Eosinophils Relative: 2 %
HCT: 40.7 % (ref 39.0–52.0)
Hemoglobin: 13.9 g/dL (ref 13.0–17.0)
Immature Granulocytes: 1 %
Lymphocytes Relative: 31 %
Lymphs Abs: 3.4 K/uL (ref 0.7–4.0)
MCH: 32 pg (ref 26.0–34.0)
MCHC: 34.2 g/dL (ref 30.0–36.0)
MCV: 93.6 fL (ref 80.0–100.0)
Monocytes Absolute: 0.9 K/uL (ref 0.1–1.0)
Monocytes Relative: 9 %
Neutro Abs: 6.4 K/uL (ref 1.7–7.7)
Neutrophils Relative %: 57 %
Platelets: 297 K/uL (ref 150–400)
RBC: 4.35 MIL/uL (ref 4.22–5.81)
RDW: 12.4 % (ref 11.5–15.5)
WBC: 11.1 K/uL — ABNORMAL HIGH (ref 4.0–10.5)
nRBC: 0 % (ref 0.0–0.2)

## 2024-02-01 MED ORDER — LACTATED RINGERS IV BOLUS
500.0000 mL | Freq: Once | INTRAVENOUS | Status: AC
  Administered 2024-02-01: 500 mL via INTRAVENOUS

## 2024-02-01 NOTE — ED Provider Triage Note (Addendum)
 Emergency Medicine Provider Triage Evaluation Note  Raymond Mcclure , a 58 y.o. male  was evaluated in triage.  Pt complains of chest pain. History of CAD, dyslipidemia, OSA, HTN, elevated BMI, polysubstance use disorder, GERD.  Patient reportedly had a recent ablation for atrial fibrillation and was started on a cardiac monitor today.  Reports that he began having substernal chest pressure at approximately 7 PM.  He took nitroglycerin  at home x 3, which partially alleviated his pain, however he had persistent pain, therefore he called EMS.  EMS reports that patient complained of persistent chest pain, therefore they administered additional 2 nitroglycerin  en route (total of 5 nitroglycerin ), and patient complained of persistent pain, therefore they administered 4 mg of morphine .  Patient also received 324 mg ASA with EMS.  Patient thereafter began to complain of dizziness, and blood pressure dropped to 84/48.  Thus, patient was initiated on 500 cc normal saline and placed in a supine position, patient reports that he has persistent chest pain, however the dizziness has partially improved.  Denies fevers, chills, shortness of breath.  Reports that he has not used cocaine in several months.  Review of Systems  Positive: Per above Negative: Per above  Physical Exam  There were no vitals taken for this visit. Gen:   Awake, no distress   Resp:  Normal effort  MSK:   Moves extremities without difficulty   Medical Decision Making  Medically screening exam initiated at 10:50 PM.  Appropriate orders placed.  GHASSAN COGGESHALL was informed that the remainder of the evaluation will be completed by another provider, this initial triage assessment does not replace that evaluation, and the importance of remaining in the ED until their evaluation is complete.    Rogelia Jerilynn RAMAN, MD 02/01/24 [phone]

## 2024-02-01 NOTE — ED Triage Notes (Signed)
 BIB [facility] from home with c/o intermittent chest pain that started at 1900. Pt had taken 3 0.4 mg SL nitroglycerin  tabs and was given 2 more 0.4 mg SL nitroglycerin  tabs by EMS. Pt was given 4 mg of Morphine  approx 5 min PTA, pt became hypotensive and EMS started 500 mL NS. Pt still c/o chest pain.   BP 152/84 before morphine , 84/48 after morphine  HR 90-120s Spo2 98% RA 20 Rt hand

## 2024-02-02 LAB — COMPREHENSIVE METABOLIC PANEL WITH GFR
ALT: 22 U/L (ref 0–44)
AST: 25 U/L (ref 15–41)
Albumin: 3.9 g/dL (ref 3.5–5.0)
Alkaline Phosphatase: 102 U/L (ref 38–126)
Anion gap: 12 (ref 5–15)
BUN: 18 mg/dL (ref 6–20)
CO2: 23 mmol/L (ref 22–32)
Calcium: 9.2 mg/dL (ref 8.9–10.3)
Chloride: 108 mmol/L (ref 98–111)
Creatinine, Ser: 1.01 mg/dL (ref 0.61–1.24)
GFR, Estimated: >60 mL/min
Glucose, Bld: 125 mg/dL — ABNORMAL HIGH (ref 70–99)
Potassium: 3.8 mmol/L (ref 3.5–5.1)
Sodium: 142 mmol/L (ref 135–145)
Total Bilirubin: <0.2 mg/dL (ref 0.0–1.2)
Total Protein: 6.6 g/dL (ref 6.5–8.1)

## 2024-02-02 LAB — TROPONIN T, HIGH SENSITIVITY
Troponin T High Sensitivity: <15 ng/L (ref 0–19)
Troponin T High Sensitivity: <15 ng/L (ref 0–19)

## 2024-02-02 NOTE — ED Notes (Signed)
 Pt stated he was leaving. IV removed by this tech.

## 2024-02-06 ENCOUNTER — Ambulatory Visit: Payer: Self-pay | Admitting: Adult Health

## 2024-02-06 ENCOUNTER — Ambulatory Visit: Admitting: Adult Health
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
## 2024-02-13 ENCOUNTER — Ambulatory Visit (HOSPITAL_COMMUNITY): Payer: Medicare (Managed Care) | Admitting: Psychiatry

## 2024-02-13 ENCOUNTER — Other Ambulatory Visit (HOSPITAL_COMMUNITY): Payer: Self-pay

## 2024-02-13 VITALS — BP 170/103 | Wt 272.0 lb

## 2024-02-13 DIAGNOSIS — F251 Schizoaffective disorder, depressive type: Secondary | ICD-10-CM

## 2024-02-13 DIAGNOSIS — F17200 Nicotine dependence, unspecified, uncomplicated: Secondary | ICD-10-CM

## 2024-02-13 DIAGNOSIS — F1020 Alcohol dependence, uncomplicated: Secondary | ICD-10-CM

## 2024-02-13 DIAGNOSIS — F1590 Other stimulant use, unspecified, uncomplicated: Secondary | ICD-10-CM | POA: Diagnosis not present

## 2024-02-13 MED ORDER — RISPERIDONE 1 MG PO TABS: 1.0000 mg | ORAL_TABLET | Freq: Two times a day (BID) | ORAL | Status: AC

## 2024-02-13 MED ORDER — NALTREXONE HCL 50 MG PO TABS: 50.0000 mg | ORAL_TABLET | Freq: Every day | ORAL | 0 refills | Status: AC

## 2024-02-13 MED ORDER — ESCITALOPRAM OXALATE 10 MG PO TABS: 20.0000 mg | ORAL_TABLET | Freq: Every day | ORAL | 0 refills | Status: DC

## 2024-02-13 MED ORDER — OMEPRAZOLE 40 MG PO CPDR
40.0000 mg | DELAYED_RELEASE_CAPSULE | Freq: Every evening | ORAL | 3 refills | Status: DC
  Filled 2024-02-13 (×2): qty 90, 90d supply, fill #0

## 2024-02-13 MED ORDER — FAMOTIDINE 40 MG PO TABS
40.0000 mg | ORAL_TABLET | Freq: Every morning | ORAL | 3 refills | Status: DC
  Filled 2024-02-13 (×2): qty 90, 90d supply, fill #0

## 2024-02-13 MED ORDER — ATORVASTATIN CALCIUM 40 MG PO TABS
40.0000 mg | ORAL_TABLET | Freq: Every morning | ORAL | 3 refills | Status: AC
  Filled 2024-02-13 (×2): qty 90, 90d supply, fill #0

## 2024-02-13 MED ORDER — ESCITALOPRAM OXALATE 10 MG PO TABS
10.0000 mg | ORAL_TABLET | Freq: Every morning | ORAL | 3 refills | Status: AC
  Filled 2024-02-13 (×2): qty 90, 90d supply, fill #0

## 2024-02-13 MED ORDER — FUROSEMIDE 20 MG PO TABS
20.0000 mg | ORAL_TABLET | Freq: Every morning | ORAL | 3 refills | Status: AC
  Filled 2024-02-13 (×2): qty 90, 90d supply, fill #0

## 2024-02-13 MED ORDER — ESCITALOPRAM OXALATE 10 MG PO TABS: 15.0000 mg | ORAL_TABLET | Freq: Every day | ORAL | 0 refills | Status: DC

## 2024-02-13 MED ORDER — PREDNISONE 10 MG (21) PO TBPK
ORAL_TABLET | ORAL | 0 refills | Status: AC
  Filled 2024-02-13: qty 21, 6d supply, fill #0

## 2024-02-13 MED ORDER — NALTREXONE HCL 50 MG PO TABS
50.0000 mg | ORAL_TABLET | Freq: Every day | ORAL | 0 refills | Status: AC
  Filled 2024-02-13 (×2): qty 90, 90d supply, fill #0

## 2024-02-13 MED ORDER — RISPERIDONE 1 MG PO TABS: 1.0000 mg | ORAL_TABLET | Freq: Every day | ORAL | 0 refills | Status: DC

## 2024-02-13 MED ORDER — ESCITALOPRAM OXALATE 20 MG PO TABS: 20.0000 mg | ORAL_TABLET | Freq: Every day | ORAL | 0 refills | Status: DC

## 2024-02-13 MED ORDER — LOSARTAN POTASSIUM 50 MG PO TABS
50.0000 mg | ORAL_TABLET | Freq: Every morning | ORAL | 3 refills | Status: AC
  Filled 2024-02-13 (×2): qty 90, 90d supply, fill #0

## 2024-02-13 MED ORDER — AMLODIPINE BESYLATE 5 MG PO TABS: 5.0000 mg | ORAL_TABLET | Freq: Every morning | ORAL | 3 refills | Status: DC

## 2024-02-13 NOTE — Patient Instructions (Addendum)
 DONT EAT BEFORE YOUR APPOINTMENT IN [facility]  Address:  [address], in [place], [address] Ph: [phone]

## 2024-02-13 NOTE — Addendum Note (Signed)
 Addended by: CARVIN CROCK on: 02/13/2024 02:11 PM   Modules accepted: Level of Service

## 2024-02-14 ENCOUNTER — Ambulatory Visit (HOSPITAL_COMMUNITY)
Admission: RE | Admit: 2024-02-14 | Discharge: 2024-02-14 | Disposition: A | Discharge status: Home / Self Care | Payer: Medicare (Managed Care) | Source: Ambulatory Visit | Attending: Cardiology | Admitting: Cardiology

## 2024-02-14 DIAGNOSIS — R0609 Other forms of dyspnea: Secondary | ICD-10-CM | POA: Insufficient documentation

## 2024-02-14 LAB — ECHOCARDIOGRAM COMPLETE
Area-P 1/2: 3.85 cm2
Est EF: >75
S' Lateral: 1.9 cm

## 2024-02-16 ENCOUNTER — Other Ambulatory Visit: Payer: Self-pay

## 2024-02-16 ENCOUNTER — Emergency Department (HOSPITAL_COMMUNITY)
Admission: EM | Admit: 2024-02-16 | Discharge: 2024-02-16 | Disposition: A | Discharge status: Home / Self Care | Payer: Medicare (Managed Care) | Attending: Emergency Medicine | Admitting: Emergency Medicine

## 2024-02-16 ENCOUNTER — Emergency Department (HOSPITAL_COMMUNITY): Payer: Medicare (Managed Care)

## 2024-02-16 DIAGNOSIS — Z7982 Long term (current) use of aspirin: Secondary | ICD-10-CM | POA: Diagnosis not present

## 2024-02-16 DIAGNOSIS — F1410 Cocaine abuse, uncomplicated: Secondary | ICD-10-CM | POA: Diagnosis not present

## 2024-02-16 DIAGNOSIS — Z91010 Allergy to peanuts: Secondary | ICD-10-CM | POA: Diagnosis not present

## 2024-02-16 DIAGNOSIS — I10 Essential (primary) hypertension: Secondary | ICD-10-CM | POA: Insufficient documentation

## 2024-02-16 DIAGNOSIS — Z79899 Other long term (current) drug therapy: Secondary | ICD-10-CM | POA: Insufficient documentation

## 2024-02-16 DIAGNOSIS — R079 Chest pain, unspecified: Secondary | ICD-10-CM | POA: Diagnosis present

## 2024-02-16 DIAGNOSIS — Z91199 Patient's noncompliance with other medical treatment and regimen due to unspecified reason: Secondary | ICD-10-CM | POA: Diagnosis not present

## 2024-02-16 LAB — CBC
HCT: 49.9 % (ref 39.0–52.0)
Hemoglobin: 16.9 g/dL (ref 13.0–17.0)
MCH: 32.1 pg (ref 26.0–34.0)
MCHC: 33.9 g/dL (ref 30.0–36.0)
MCV: 94.7 fL (ref 80.0–100.0)
Platelets: 307 K/uL (ref 150–400)
RBC: 5.27 MIL/uL (ref 4.22–5.81)
RDW: 12.4 % (ref 11.5–15.5)
WBC: 10.5 K/uL (ref 4.0–10.5)
nRBC: 0 % (ref 0.0–0.2)

## 2024-02-16 LAB — TROPONIN T, HIGH SENSITIVITY: Troponin T High Sensitivity: <15 ng/L (ref 0–19)

## 2024-02-16 LAB — BASIC METABOLIC PANEL WITH GFR
Anion gap: 9 (ref 5–15)
BUN: 13 mg/dL (ref 6–20)
CO2: 25 mmol/L (ref 22–32)
Calcium: 9.9 mg/dL (ref 8.9–10.3)
Chloride: 104 mmol/L (ref 98–111)
Creatinine, Ser: 1.1 mg/dL (ref 0.61–1.24)
GFR, Estimated: >60 mL/min
Glucose, Bld: 102 mg/dL — ABNORMAL HIGH (ref 70–99)
Potassium: 4.1 mmol/L (ref 3.5–5.1)
Sodium: 138 mmol/L (ref 135–145)

## 2024-02-16 MED ORDER — LOSARTAN POTASSIUM 50 MG PO TABS
50.0000 mg | ORAL_TABLET | Freq: Once | ORAL | Status: AC
  Administered 2024-02-16: 50 mg via ORAL
  Filled 2024-02-16: qty 1

## 2024-02-16 MED ORDER — AMLODIPINE BESYLATE 5 MG PO TABS
5.0000 mg | ORAL_TABLET | Freq: Once | ORAL | Status: AC
  Administered 2024-02-16: 5 mg via ORAL
  Filled 2024-02-16: qty 1

## 2024-02-16 MED ORDER — ACETAMINOPHEN 500 MG PO TABS
1000.0000 mg | ORAL_TABLET | Freq: Once | ORAL | Status: AC
  Administered 2024-02-16: 1000 mg via ORAL
  Filled 2024-02-16: qty 2

## 2024-02-16 NOTE — ED Provider Triage Note (Signed)
 Emergency Medicine Provider Triage Evaluation Note  Raymond Mcclure , a 59 y.o. male  was evaluated in triage.  Pt complains of chest pain. Report having pain to his Left chest and heart palpitation x 3 days since he resume using cocaine.  No fever, chills, sob, cough, n/v/d.  Hx heart palpitation s/p cardiac ablation in the past.   Review of Systems  Positive: As above Negative: As above  Physical Exam  BP (!) 171/107 (BP Location: Right Arm)   Pulse (!) 111   Temp 97.6 F (36.4 C) (Oral)   Resp 16   SpO2 96%  Gen:   Awake, no distress   Resp:  Normal effort  MSK:   Moves extremities without difficulty  Other:    Medical Decision Making  Medically screening exam initiated at 7:37 AM.  Appropriate orders placed.  SHAYAAN PARKE was informed that the remainder of the evaluation will be completed by another provider, this initial triage assessment does not replace that evaluation, and the importance of remaining in the ED until their evaluation is complete.     Nivia Colon, PA-C 02/16/24 [phone]

## 2024-02-16 NOTE — ED Provider Notes (Signed)
 " [place] EMERGENCY DEPARTMENT AT [facility] Provider Note   CSN: [id number] Arrival date & time: 02/16/24  9283     Patient presents with: Hypertension   Raymond Mcclure is a 59 y.o. male.   HPI Patient what she has been having chest pain for several days.  He reports it hurts on his central and left upper chest.  Pain is aching in quality.  He reports he has had pain off and on in a similar area.  The patient also reports that he is been on a bit of a cocaine binge for 3 days and stopped taking all of his medications.  He has not had any blood pressure medication for at least 3 days.  Patient also reports that he fell 1 or 2 days ago and his left arm is sore.  He indicates pain around his wrist and forearm.  He is not sure how he fell.  He denies shortness of breath.  No fever no lower extremity swelling no productive cough.    Prior to Admission medications  Medication Sig Start Date End Date Taking? Authorizing Provider  amLODipine  (NORVASC ) 5 MG tablet Take 1 tablet (5 mg total) by mouth daily. 01/30/24 01/29/25  Lelon Hamilton T, PA-C  amLODipine  (NORVASC ) 5 MG tablet Take 1 tablet (5 mg total) by mouth every morning. 02/13/24     aspirin  EC 81 MG tablet Take 1 tablet (81 mg total) by mouth daily. Swallow whole. 11/22/23 11/21/24  Zheng, Michael, DO  atorvastatin  (LIPITOR) 40 MG tablet TAKE ONE TABLET BY MOUTH AT BEDTIME *NEW PRESCRIPTION REQUEST* 11/17/23   Zheng, Michael, DO  atorvastatin  (LIPITOR) 40 MG tablet Take 1 tablet (40 mg total) by mouth every morning. 02/13/24     escitalopram  (LEXAPRO ) 10 MG tablet Take 1 tablet (10 mg total) by mouth every morning. 02/13/24     escitalopram  (LEXAPRO ) 20 MG tablet Take 1 tablet (20 mg total) by mouth daily. 02/13/24   Izella Ismael NOVAK, MD  famotidine  (PEPCID ) 40 MG tablet Take 1 tablet (40 mg total) by mouth every morning. 02/13/24     fluticasone  furoate-vilanterol (BREO ELLIPTA ) 100-25 MCG/ACT AEPB Inhale 1 puff into the lungs  daily. 11/17/23   Zheng, Michael, DO  furosemide  (LASIX ) 20 MG tablet Take 1 tablet (20 mg total) by mouth daily. 01/10/24 01/09/25  Jordan, Peter M, MD  furosemide  (LASIX ) 20 MG tablet Take 1 tablet (20 mg total) by mouth every morning. 02/13/24     hydrOXYzine  (ATARAX ) 25 MG tablet Take 1 tablet (25 mg total) by mouth 3 (three) times daily as needed for anxiety. 12/12/23   Izella Ismael NOVAK, MD  losartan  (COZAAR ) 50 MG tablet Take 1 tablet (50 mg total) by mouth daily. 01/03/24 04/02/24  Tobb, Kardie, DO  losartan  (COZAAR ) 50 MG tablet Take 1 tablet (50 mg total) by mouth every morning. 02/13/24     naltrexone  (DEPADE) 50 MG tablet Take 1 tablet (50 mg total) by mouth daily. 02/13/24   Izella Ismael NOVAK, MD  naltrexone  (DEPADE) 50 MG tablet Take 1 tablet (50 mg total) by mouth daily. 02/13/24     nitroGLYCERIN  (NITROSTAT ) 0.4 MG SL tablet Place 1 tablet (0.4 mg total) under the tongue every 5 (five) minutes as needed for chest pain (Up to three times). 01/03/24 04/02/24  Tobb, Kardie, DO  omeprazole  (PRILOSEC) 40 MG capsule Take 1 capsule (40 mg total) by mouth at bedtime. 02/13/24     ondansetron  (ZOFRAN -ODT) 4 MG disintegrating tablet  Take 1 tablet (4 mg total) by mouth every 8 (eight) hours as needed for nausea or vomiting. 11/22/23   Zheng, Michael, DO  pantoprazole  (PROTONIX ) 40 MG tablet Take 1 tablet (40 mg total) by mouth daily. 11/25/23   Elicia Sharper, DO  predniSONE  (STERAPRED UNI-PAK 21 TAB) 10 MG (21) TBPK tablet Take 6 tablets (60 mg total) by mouth daily for 1 day, THEN 5 tablets (50 mg total) daily for 1 day, THEN 4 tablets (40 mg total) daily for 1 day, THEN 3 tablets (30 mg total) daily for 1 day, THEN 2 tablets (20 mg total) daily for 1 day, THEN 1 tablet (10 mg total) daily for 1 day. 02/13/24 02/20/24    risperiDONE  (RISPERDAL ) 1 MG tablet Take 1 tablet (1 mg total) by mouth 2 (two) times daily. 02/13/24   Izella Ismael NOVAK, MD  Varenicline  Tartrate, Starter, (CHANTIX  STARTING MONTH PAK) 0.5 MG X  11 & 1 MG X 42 TBPK Take 1 mg by mouth 2 (two) times daily. 10/28/23   Tobie Gaines, DO    Allergies: Bee venom, Peanut-containing drug products, and Covid-19 (mrna) vaccine    Review of Systems  Updated Vital Signs BP (!) 171/107 (BP Location: Right Arm)   Pulse (!) 111   Temp 97.6 F (36.4 C) (Oral)   Resp 16   SpO2 96%   Physical Exam Constitutional:      Comments: Patient is sleeping quietly underneath blankets.  He awakens to light voice and light touch.  No distress.  Respirations nonlabored.  Mental status clear.  HENT:     Head: Normocephalic.     Mouth/Throat:     Pharynx: Oropharynx is clear.  Cardiovascular:     Rate and Rhythm: Normal rate and regular rhythm.  Pulmonary:     Effort: Pulmonary effort is normal.     Breath sounds: Normal breath sounds.     Comments: Patient does have some reproducible chest wall discomfort to deep pressure on the left anterior and lateral chest as well as posterior thoracic chest.  There is no visual abnormality.  No contusions no abrasions no hematomas.  No palpable crepitus.  No rashes. Abdominal:     Comments: Abdomen is soft and nontender no discomfort to palpation of the left upper quadrant.  Musculoskeletal:     Comments: No peripheral edema.  Calves are soft and pliable.  Patient reports some discomfort in his left forearm and wrist.  There is no appreciable deformity.  No warmth or erythema.  Range of motion is intact.  Skin:    General: Skin is warm and dry.  Neurological:     General: No focal deficit present.     Mental Status: He is oriented to person, place, and time.     Motor: No weakness.     Coordination: Coordination normal.  Psychiatric:        Mood and Affect: Mood normal.     (all labs ordered are listed, but only abnormal results are displayed) Labs Reviewed  BASIC METABOLIC PANEL WITH GFR - Abnormal; Notable for the following components:      Result Value   Glucose, Bld 102 (*)    All other components  within normal limits  CBC  TROPONIN T, HIGH SENSITIVITY  TROPONIN T, HIGH SENSITIVITY    EKG: EKG Interpretation Date/Time:  Thursday February 16 2024 07:32:07 EST Ventricular Rate:  97 PR Interval:  165 QRS Duration:  83 QT Interval:  367 QTC Calculation: 467 R Axis:  49  Text Interpretation: Sinus rhythm Consider left atrial enlargement noacute ischemic appearance. no sig change from previous Confirmed by Armenta Canning [phone]) on 02/16/2024 9:18:28 AM  Radiology: DG Chest 2 View Result Date: 02/16/2024 EXAM: 2 VIEW(S) XRAY OF THE CHEST 02/16/2024 07:42:00 AM COMPARISON: 02/01/2024 CLINICAL HISTORY: chest palpation chest palpation chest palpation chest palpation chest palpation FINDINGS: LUNGS AND PLEURA: Unchanged elevated right hemidiaphragm. No focal pulmonary opacity. No pleural effusion. No pneumothorax. HEART AND MEDIASTINUM: No acute abnormality of the cardiac and mediastinal silhouettes. BONES AND SOFT TISSUES: No acute osseous abnormality. IMPRESSION: 1. No acute findings. Electronically signed by: Waddell Calk MD 02/16/2024 07:47 AM EST RP Workstation: [id number]   ECHOCARDIOGRAM COMPLETE Result Date: 02/14/2024    ECHOCARDIOGRAM REPORT   Patient Name:   DEAGO BURRUSS Date of Exam: 02/14/2024 Medical Rec #:  [id number]      Height:       70.0 in Accession #:    [id number]     Weight:       272.0 lb Date of Birth:  10/30/1965      BSA:          2.379 m Patient Age:    58 years       BP:           137/99 mmHg Patient Gender: M              HR:           81 bpm. Exam Location:  [address] Procedure: 2D Echo, 3D Echo, Cardiac Doppler and Color Doppler (Both Spectral            and Color Flow Doppler were utilized during procedure). Indications:    R06.09 DOE  History:        Patient has prior history of Echocardiogram examinations, most                 recent 07/14/2006. CAD; Risk Factors:Sleep Apnea, HLD and                 Hypertension.  Sonographer:    Waldo Guadalajara RCS Referring  Phys: [id number] KARDIE TOBB IMPRESSIONS  1. Left ventricular ejection fraction, by estimation, is >75%. Left ventricular ejection fraction by 3D volume is 68 %. The left ventricle has hyperdynamic function. The left ventricle has no regional wall motion abnormalities. There is mild left ventricular hypertrophy of the basal-septal segment. Left ventricular diastolic parameters are consistent with Grade I diastolic dysfunction (impaired relaxation). The average left ventricular global longitudinal strain is -15.2 %. The global longitudinal strain is abnormal.  2. Right ventricular systolic function is normal. The right ventricular size is normal. There is normal pulmonary artery systolic pressure.  3. The mitral valve is normal in structure. No evidence of mitral valve regurgitation. No evidence of mitral stenosis.  4. The aortic valve is tricuspid. Aortic valve regurgitation is not visualized. No aortic stenosis is present.  5. The inferior vena cava is dilated in size with <50% respiratory variability, suggesting right atrial pressure of 15 mmHg. FINDINGS  Left Ventricle: Left ventricular ejection fraction, by estimation, is >75%. Left ventricular ejection fraction by 3D volume is 68 %. The left ventricle has hyperdynamic function. The left ventricle has no regional wall motion abnormalities. The average left ventricular global longitudinal strain is -15.2 %. Strain was performed and the global longitudinal strain is abnormal. The left ventricular internal cavity size was normal in size. There is mild left ventricular hypertrophy of the basal-septal  segment. Left ventricular diastolic parameters are consistent with Grade I diastolic dysfunction (impaired relaxation). Right Ventricle: The right ventricular size is normal. No increase in right ventricular wall thickness. Right ventricular systolic function is normal. There is normal pulmonary artery systolic pressure. The tricuspid regurgitant velocity is 1.59 m/s, and   with an assumed right atrial pressure of 15 mmHg, the estimated right ventricular systolic pressure is 25.1 mmHg. Left Atrium: Left atrial size was normal in size. Right Atrium: Right atrial size was normal in size. Pericardium: There is no evidence of pericardial effusion. Mitral Valve: The mitral valve is normal in structure. No evidence of mitral valve regurgitation. No evidence of mitral valve stenosis. Tricuspid Valve: The tricuspid valve is normal in structure. Tricuspid valve regurgitation is not demonstrated. No evidence of tricuspid stenosis. Aortic Valve: The aortic valve is tricuspid. Aortic valve regurgitation is not visualized. No aortic stenosis is present. Pulmonic Valve: The pulmonic valve was not well visualized. Pulmonic valve regurgitation is not visualized. Aorta: The aortic root is normal in size and structure. Venous: The inferior vena cava is dilated in size with less than 50% respiratory variability, suggesting right atrial pressure of 15 mmHg. IAS/Shunts: The atrial septum is grossly normal. Additional Comments: 3D was performed not requiring image post processing on an independent workstation and was normal.  LEFT VENTRICLE PLAX 2D LVIDd:         3.00 cm         Diastology LVIDs:         1.90 cm         LV e' medial:    4.79 cm/s LV PW:         1.10 cm         LV E/e' medial:  11.6 LV IVS:        1.30 cm         LV e' lateral:   7.29 cm/s LVOT diam:     1.90 cm         LV E/e' lateral: 7.6 LV SV:         65 LV SV Index:   27              2D Longitudinal LVOT Area:     2.84 cm        Strain LV IVRT:       139 msec        2D Strain GLS   -15.2 %                                Avg:                                 3D Volume EF                                LV 3D EF:    Left                                             ventricul  ar                                             ejection                                             fraction                                              by 3D                                             volume is                                             68 %.                                 3D Volume EF:                                3D EF:        68 %                                LV EDV:       89 ml                                LV ESV:       28 ml                                LV SV:        61 ml RIGHT VENTRICLE             IVC RV Basal diam:  3.40 cm     IVC diam: 2.66 cm RV S prime:     10.40 cm/s TAPSE (M-mode): 1.9 cm      PULMONARY VEINS RVSP:           13.1 mmHg   A Reversal Velocity: 27.00 cm/s                             Diastolic Velocity:  20.40 cm/s                             S/D Velocity:        1.30                             Systolic  Velocity:   27.40 cm/s LEFT ATRIUM             Index        RIGHT ATRIUM           Index LA diam:        3.30 cm 1.39 cm/m   RA Pressure: 3.00 mmHg LA Vol (A2C):   35.8 ml 15.05 ml/m  RA Area:     8.51 cm LA Vol (A4C):   32.4 ml 13.62 ml/m  RA Volume:   14.00 ml  5.89 ml/m LA Biplane Vol: 34.8 ml 14.63 ml/m  AORTIC VALVE LVOT Vmax:   97.80 cm/s LVOT Vmean:  75.000 cm/s LVOT VTI:    0.230 m  AORTA Ao Root diam: 3.80 cm Ao Asc diam:  3.00 cm MITRAL VALVE               TRICUSPID VALVE MV Area (PHT):             TR Peak grad:   10.1 mmHg MV Decel Time:             TR Vmax:        159.00 cm/s MV E velocity: 55.50 cm/s  Estimated RAP:  3.00 mmHg MV A velocity: 83.80 cm/s  RVSP:           13.1 mmHg MV E/A ratio:  0.66                            SHUNTS                            Systemic VTI:  0.23 m                            Systemic Diam: 1.90 cm Sunit Tolia Electronically signed by Madonna Large Signature Date/Time: 02/14/2024/12:58:58 PM    Final      Procedures   Medications Ordered in the ED  amLODipine  (NORVASC ) tablet 5 mg (has no administration in time range)  losartan  (COZAAR ) tablet 50 mg (has no administration in time range)  acetaminophen  (TYLENOL ) tablet 1,000 mg  (has no administration in time range)                                    Medical Decision Making Amount and/or Complexity of Data Reviewed Labs: ordered. Radiology: ordered.   Patient presents as outlined.  Agree with proceeding with ACS evaluation\chest x-ray to rule out rib fracture\pneumonia\pneumothorax.  Patient is hypertensive but has not taken his regular medications for at least 3 days.  Patient incidentally had echocardiogram 3 days earlier.  Grade 1 diastolic dysfunction.  No significant valvular abnormality.  This patient has been noncompliant with medications will administer his home blood pressure medications.  Patient also describes a fall with some left arm and left sided chest pain.  Physical exam does not suggest significant trauma.  Chest x-ray reviewed by radiology is negative for any acute findings.  Will give acetaminophen  for suspected musculoskeletal pain.  Troponin less than 15 basic metabolic panel normal CBC normal  At this time patient does not clinically show signs of acute CHF and chest x-ray is clear.  Patient does not have subjective shortness of breath or hypoxia.  Troponin is a less than 15  and EKG does not show ischemic change.  At this time no signs of acute coronary syndrome.  I feel patient is stable for discharge and continued outpatient management.  He is counseled for cocaine cessation as this certainly will exacerbate hypertension and ACS.  He is counseled for compliance with medications and close follow-up.  Patient voices understanding.     Final diagnoses:  Hypertension, poor control  Medically noncompliant  Chest pain, unspecified type  Cocaine abuse [facility])    ED Discharge Orders     None          Armenta Canning, MD 02/16/24 [phone]  "

## 2024-02-16 NOTE — Discharge Instructions (Signed)
 1.  You must take your hypertension medications consistently.  Poorly managed hypertension is very dangerous. 2.  You must cease all use of any cocaine.  This is damaging to your heart and blood vessels.  The risk of permanent disability or sudden death. 3.  Make an appointment to see your family doctor for recheck as soon as possible. 4.  You may have muscular strain and bruising from your fall and your chest and left arm.  You may take over-the-counter extra strength Tylenol  per package instructions for pain.  You may also use over-the-counter Lidoderm  patches if needed for pain.

## 2024-02-16 NOTE — ED Triage Notes (Signed)
 Pt reports hx of heart issues, had an ablation, pt reports he's not supposed to do drug because of his heart but he did Cocaine  x 3 hours ago. Pt denies chest pain reports palpation and endorses depression.

## 2024-02-19 NOTE — Progress Notes (Unsigned)
 "     Raymond Console, PA-C [address] [place], [place]  [address] Phone: [phone]   Primary Care Physician: Elicia Sharper, DO  Primary Gastroenterologist:  Raymond Console, PA-C / Dr. Gordy Starch   Chief Complaint: Follow-up dysphagia, GERD and rectal bleeding.  Discuss EGD and colonoscopy.       HPI:   Discussed the use of AI scribe software for clinical note transcription with the patient, who gave verbal consent to proceed.  I last saw him 01/05/24 to evaluate GERD, dysphagia, rectal bleeding.  EGD and colonoscopy were recommended, however had to be postponed until after he completed cardiac evaluation for chest pain and shortness of breath.  Cardiac cath 01/10/2024 showed no CAD, Normal LVF, LVEDP 30.   Echocardiogram 02/14/2024 showed normal LVEF 68%.  Per cardiology, okay to proceed with EGD and colonoscopy with acceptable risk.  01/2024 barium swallow with tablet: Mild esophageal dysmotility.  Mild acid reflux.  Tiny hiatal hernia.  Otherwise normal.  No stricture. 01/2024 fecal calprotectin normal.  History of Present Illness Raymond Mcclure is a 59 year old male with gastroesophageal reflux disease who presents for evaluation of persistent acid reflux and dysphagia.  Gastroesophageal Reflux Symptoms: - Persistent, severe acid reflux symptoms occurring both during the day and at night - Symptoms sometimes awaken him from sleep - Acid symptoms are intense and can occur even without eating - Omeprazole  40mg  daily provides only partial relief despite a recent dose increase - Omeprazole  is more effective than Pantoprazole , but symptoms remain incompletely controlled  Dysphagia: - Significant dysphagia with both solids and liquids - Sensation of food and water  getting stuck in the chest - Requires effort to push it down when eating - Even drinking water  can cause the sensation of food sticking  Lower Gastrointestinal Symptoms: - No rectal bleeding - No diarrhea or  constipation - No ongoing abdominal pain  Sleep Apnea and Sedation Concerns: - Diagnosed with sleep apnea and uses CPAP at night - Concerned about undergoing procedures under sedation due to prior episodes of respiratory compromise, including code blue events related to sleep apnea - Patient reports stopping breathing when he is sedated.  He is requesting to have his procedures done in hospital for this reason.  Cardiopulmonary Status: - Recent cardiac evaluation including heart catheterization, echocardiogram, and cardiogram revealed no abnormalities - Cleared by cardiologist for upper endoscopy and colonoscopy - Attributes most current shortness of breath to lack of exercise  PMH: Bipolar disorder, SVT s/p ablation, HTN, HLD, OSA, polysubstance abuse, GERD, CHF with preserved ejection fraction.  10/2023 abdominal pelvic CT with contrast showed: -Mild submucosal fat within the cecal region.  Nonspecific, may represent infectious/inflammatory process (colitis).  Cannot rule out Crohn's disease.  Follow-up colonoscopy recommended. -Few small 5 mm stable pulmonary nodules.   11/2018 screening colonoscopy done at [facility] in [place]: 2 small (5 mm, 3 mm) sessile polyps removed from rectum (hyperplastic) and splenic flexure (prominent lymphoid aggregate).  Mild sigmoid diverticulosis.  Small internal hemorrhoids.  Otherwise normal.  Prep was fair.  3-year repeat colonoscopy was recommended due to fair prep (was due 11/2021).   11/2018 EGD done at [facility] (for heartburn): Mild erosive esophagitis.  Mild antral gastritis.  Otherwise normal.  Benign gastric biopsies.   Current Outpatient Medications  Medication Sig Dispense Refill   amLODipine  (NORVASC ) 5 MG tablet Take 1 tablet (5 mg total) by mouth daily. 30 tablet 11   amLODipine  (NORVASC ) 5 MG tablet Take 1 tablet (5  mg total) by mouth every morning. 90 tablet 3   aspirin  EC 81 MG tablet Take 1 tablet (81 mg total) by mouth daily.  Swallow whole. 150 tablet 2   atorvastatin  (LIPITOR) 40 MG tablet TAKE ONE TABLET BY MOUTH AT BEDTIME *NEW PRESCRIPTION REQUEST* 90 tablet 3   atorvastatin  (LIPITOR) 40 MG tablet Take 1 tablet (40 mg total) by mouth every morning. 90 tablet 3   escitalopram  (LEXAPRO ) 10 MG tablet Take 1 tablet (10 mg total) by mouth every morning. 90 tablet 3   escitalopram  (LEXAPRO ) 20 MG tablet Take 1 tablet (20 mg total) by mouth daily. 90 tablet 0   famotidine  (PEPCID ) 40 MG tablet Take 1 tablet (40 mg total) by mouth every morning. 90 tablet 3   fluticasone  furoate-vilanterol (BREO ELLIPTA ) 100-25 MCG/ACT AEPB Inhale 1 puff into the lungs daily. 90 each 3   furosemide  (LASIX ) 20 MG tablet Take 1 tablet (20 mg total) by mouth daily. 30 tablet 11   furosemide  (LASIX ) 20 MG tablet Take 1 tablet (20 mg total) by mouth every morning. 90 tablet 3   hydrOXYzine  (ATARAX ) 25 MG tablet Take 1 tablet (25 mg total) by mouth 3 (three) times daily as needed for anxiety. 90 tablet 2   losartan  (COZAAR ) 50 MG tablet Take 1 tablet (50 mg total) by mouth daily. 30 tablet 3   losartan  (COZAAR ) 50 MG tablet Take 1 tablet (50 mg total) by mouth every morning. 90 tablet 3   Na Sulfate-K Sulfate-Mg Sulfate concentrate (SUPREP) 17.5-3.13-1.6 GM/177ML SOLN Take 1 kit (354 mLs total) by mouth once for 1 dose. 354 mL 0   naltrexone  (DEPADE) 50 MG tablet Take 1 tablet (50 mg total) by mouth daily. 90 tablet 0   naltrexone  (DEPADE) 50 MG tablet Take 1 tablet (50 mg total) by mouth daily. 90 tablet 0   nitroGLYCERIN  (NITROSTAT ) 0.4 MG SL tablet Place 1 tablet (0.4 mg total) under the tongue every 5 (five) minutes as needed for chest pain (Up to three times). 25 tablet 5   omeprazole  (PRILOSEC) 40 MG capsule Take 1 capsule (40 mg total) by mouth 2 (two) times daily. 180 capsule 3   ondansetron  (ZOFRAN -ODT) 4 MG disintegrating tablet Take 1 tablet (4 mg total) by mouth every 8 (eight) hours as needed for nausea or vomiting. 15 tablet 0    risperiDONE  (RISPERDAL ) 1 MG tablet Take 1 tablet (1 mg total) by mouth 2 (two) times daily.     Varenicline  Tartrate, Starter, (CHANTIX  STARTING MONTH PAK) 0.5 MG X 11 & 1 MG X 42 TBPK Take 1 mg by mouth 2 (two) times daily. 53 each 1   No current facility-administered medications for this visit.    Allergies as of 02/21/2024 - Review Complete 02/21/2024  Allergen Reaction Noted   Bee venom Anaphylaxis 07/14/2013   Peanut-containing drug products Anaphylaxis and Swelling 10/17/2015   Covid-19 (mrna) vaccine Other (See Comments) 01/31/2020    Past Medical History:  Diagnosis Date   Alcohol use disorder    Anxiety    Cluster headaches    Depression    Drug abuse (HCC)    GERD (gastroesophageal reflux disease)    History of colon polyps    Hyperlipidemia    Hypertension    IBS (irritable bowel syndrome)    Migraines    Mild CAD    Obesity    OSA (obstructive sleep apnea)    Schizophrenia (HCC)    Tachycardia     Past Surgical History:  Procedure Laterality Date   CARDIAC CATHETERIZATION     LEFT HEART CATH AND CORONARY ANGIOGRAPHY N/A 01/10/2024   Procedure: LEFT HEART CATH AND CORONARY ANGIOGRAPHY;  Surgeon: Jordan, Peter M, MD;  Location: [facility] INVASIVE CV LAB;  Service: Cardiovascular;  Laterality: N/A;   LOOP RECORDER IMPLANT     WRIST SURGERY     nerve repair    Review of Systems:    All systems reviewed and negative except where noted in HPI.    Physical Exam:  BP 130/80   Pulse 85   Ht 5' 10 (1.778 m)   Wt 270 lb (122.5 kg)   BMI 38.74 kg/m  No LMP for male patient.  General: Well-nourished, obese, well-developed in no acute distress.  Lungs: Clear to auscultation bilaterally. Non-labored. Heart: Regular rate and rhythm, no murmurs rubs or gallops.  Abdomen: Bowel sounds are normal; Abdomen is Soft; No hepatosplenomegaly, masses or hernias;  No Abdominal Tenderness; No guarding or rebound tenderness. Neuro: Alert and oriented x 3.  Grossly intact.   Psych: Alert and cooperative, normal mood and affect.  Imaging Studies: DG Chest 2 View Result Date: 02/16/2024 EXAM: 2 VIEW(S) XRAY OF THE CHEST 02/16/2024 07:42:00 AM COMPARISON: 02/01/2024 CLINICAL HISTORY: chest palpation chest palpation chest palpation chest palpation chest palpation FINDINGS: LUNGS AND PLEURA: Unchanged elevated right hemidiaphragm. No focal pulmonary opacity. No pleural effusion. No pneumothorax. HEART AND MEDIASTINUM: No acute abnormality of the cardiac and mediastinal silhouettes. BONES AND SOFT TISSUES: No acute osseous abnormality. IMPRESSION: 1. No acute findings. Electronically signed by: Waddell Calk MD 02/16/2024 07:47 AM EST RP Workstation: [id number]   ECHOCARDIOGRAM COMPLETE Result Date: 02/14/2024    ECHOCARDIOGRAM REPORT   Patient Name:   Raymond Mcclure Date of Exam: 02/14/2024 Medical Rec #:  [id number]      Height:       70.0 in Accession #:    [id number]     Weight:       272.0 lb Date of Birth:  [birth year]      BSA:          2.379 m Patient Age:    58 years       BP:           137/99 mmHg Patient Gender: M              HR:           81 bpm. Exam Location:  [address] Procedure: 2D Echo, 3D Echo, Cardiac Doppler and Color Doppler (Both Spectral            and Color Flow Doppler were utilized during procedure). Indications:    R06.09 DOE  History:        Patient has prior history of Echocardiogram examinations, most                 recent 07/14/2006. CAD; Risk Factors:Sleep Apnea, HLD and                 Hypertension.  Sonographer:    Waldo Guadalajara RCS Referring Phys: [id number] KARDIE TOBB IMPRESSIONS  1. Left ventricular ejection fraction, by estimation, is >75%. Left ventricular ejection fraction by 3D volume is 68 %. The left ventricle has hyperdynamic function. The left ventricle has no regional wall motion abnormalities. There is mild left ventricular hypertrophy of the basal-septal segment. Left ventricular diastolic parameters are consistent with Grade I  diastolic dysfunction (impaired relaxation). The average left ventricular global longitudinal strain is -15.2 %. The  global longitudinal strain is abnormal.  2. Right ventricular systolic function is normal. The right ventricular size is normal. There is normal pulmonary artery systolic pressure.  3. The mitral valve is normal in structure. No evidence of mitral valve regurgitation. No evidence of mitral stenosis.  4. The aortic valve is tricuspid. Aortic valve regurgitation is not visualized. No aortic stenosis is present.  5. The inferior vena cava is dilated in size with <50% respiratory variability, suggesting right atrial pressure of 15 mmHg. FINDINGS  Left Ventricle: Left ventricular ejection fraction, by estimation, is >75%. Left ventricular ejection fraction by 3D volume is 68 %. The left ventricle has hyperdynamic function. The left ventricle has no regional wall motion abnormalities. The average left ventricular global longitudinal strain is -15.2 %. Strain was performed and the global longitudinal strain is abnormal. The left ventricular internal cavity size was normal in size. There is mild left ventricular hypertrophy of the basal-septal segment. Left ventricular diastolic parameters are consistent with Grade I diastolic dysfunction (impaired relaxation). Right Ventricle: The right ventricular size is normal. No increase in right ventricular wall thickness. Right ventricular systolic function is normal. There is normal pulmonary artery systolic pressure. The tricuspid regurgitant velocity is 1.59 m/s, and  with an assumed right atrial pressure of 15 mmHg, the estimated right ventricular systolic pressure is 25.1 mmHg. Left Atrium: Left atrial size was normal in size. Right Atrium: Right atrial size was normal in size. Pericardium: There is no evidence of pericardial effusion. Mitral Valve: The mitral valve is normal in structure. No evidence of mitral valve regurgitation. No evidence of mitral valve  stenosis. Tricuspid Valve: The tricuspid valve is normal in structure. Tricuspid valve regurgitation is not demonstrated. No evidence of tricuspid stenosis. Aortic Valve: The aortic valve is tricuspid. Aortic valve regurgitation is not visualized. No aortic stenosis is present. Pulmonic Valve: The pulmonic valve was not well visualized. Pulmonic valve regurgitation is not visualized. Aorta: The aortic root is normal in size and structure. Venous: The inferior vena cava is dilated in size with less than 50% respiratory variability, suggesting right atrial pressure of 15 mmHg. IAS/Shunts: The atrial septum is grossly normal. Additional Comments: 3D was performed not requiring image post processing on an independent workstation and was normal.  LEFT VENTRICLE PLAX 2D LVIDd:         3.00 cm         Diastology LVIDs:         1.90 cm         LV e' medial:    4.79 cm/s LV PW:         1.10 cm         LV E/e' medial:  11.6 LV IVS:        1.30 cm         LV e' lateral:   7.29 cm/s LVOT diam:     1.90 cm         LV E/e' lateral: 7.6 LV SV:         65 LV SV Index:   27              2D Longitudinal LVOT Area:     2.84 cm        Strain LV IVRT:       139 msec        2D Strain GLS   -15.2 %  Avg:                                 3D Volume EF                                LV 3D EF:    Left                                             ventricul                                             ar                                             ejection                                             fraction                                             by 3D                                             volume is                                             68 %.                                 3D Volume EF:                                3D EF:        68 %                                LV EDV:       89 ml                                LV ESV:       28 ml                                LV SV:        61 ml RIGHT  VENTRICLE  IVC RV Basal diam:  3.40 cm     IVC diam: 2.66 cm RV S prime:     10.40 cm/s TAPSE (M-mode): 1.9 cm      PULMONARY VEINS RVSP:           13.1 mmHg   A Reversal Velocity: 27.00 cm/s                             Diastolic Velocity:  20.40 cm/s                             S/D Velocity:        1.30                             Systolic Velocity:   27.40 cm/s LEFT ATRIUM             Index        RIGHT ATRIUM           Index LA diam:        3.30 cm 1.39 cm/m   RA Pressure: 3.00 mmHg LA Vol (A2C):   35.8 ml 15.05 ml/m  RA Area:     8.51 cm LA Vol (A4C):   32.4 ml 13.62 ml/m  RA Volume:   14.00 ml  5.89 ml/m LA Biplane Vol: 34.8 ml 14.63 ml/m  AORTIC VALVE LVOT Vmax:   97.80 cm/s LVOT Vmean:  75.000 cm/s LVOT VTI:    0.230 m  AORTA Ao Root diam: 3.80 cm Ao Asc diam:  3.00 cm MITRAL VALVE               TRICUSPID VALVE MV Area (PHT):             TR Peak grad:   10.1 mmHg MV Decel Time:             TR Vmax:        159.00 cm/s MV E velocity: 55.50 cm/s  Estimated RAP:  3.00 mmHg MV A velocity: 83.80 cm/s  RVSP:           13.1 mmHg MV E/A ratio:  0.66                            SHUNTS                            Systemic VTI:  0.23 m                            Systemic Diam: 1.90 cm Sunit Tolia Electronically signed by Madonna Large Signature Date/Time: 02/14/2024/12:58:58 PM    Final    DG Chest Portable 1 View Result Date: 02/01/2024 EXAM: 1 VIEW(S) XRAY OF THE CHEST 02/01/2024 11:26:44 PM COMPARISON: 09/10/2023 CLINICAL HISTORY: CP FINDINGS: LUNGS AND PLEURA: Low lung volume. Elevated right hemidiaphragm. Probable basilar atelectasis. No focal pulmonary opacity. No pleural effusion. No pneumothorax. HEART AND MEDIASTINUM: No acute abnormality of the cardiac and mediastinal silhouettes. BONES AND SOFT TISSUES: No acute osseous abnormality. IMPRESSION: 1. No acute findings. Electronically signed by: Greig Pique MD 02/01/2024 11:57 PM EST RP Workstation: [id number]   Sleep Study Documents Result  Date: 01/25/2024 Ordered by an unspecified provider.  Labs: CBC    Component Value Date/Time   WBC 10.5 02/16/2024 0750   RBC 5.27 02/16/2024 0750   HGB 16.9 02/16/2024 0750   HGB 14.6 09/14/2021 0914   HCT 49.9 02/16/2024 0750   HCT 43.3 09/14/2021 0914   PLT 307 02/16/2024 0750   PLT 350 09/14/2021 0914   MCV 94.7 02/16/2024 0750   MCV 94 09/14/2021 0914   MCH 32.1 02/16/2024 0750   MCHC 33.9 02/16/2024 0750   RDW 12.4 02/16/2024 0750   RDW 12.3 09/14/2021 0914   LYMPHSABS 3.4 02/01/2024 2334   MONOABS 0.9 02/01/2024 2334   EOSABS 0.3 02/01/2024 2334   BASOSABS 0.0 02/01/2024 2334    CMP     Component Value Date/Time   NA 138 02/16/2024 0750   NA 143 01/30/2024 0924   K 4.1 02/16/2024 0750   CL 104 02/16/2024 0750   CO2 25 02/16/2024 0750   GLUCOSE 102 (H) 02/16/2024 0750   BUN 13 02/16/2024 0750   BUN 14 01/30/2024 0924   CREATININE 1.10 02/16/2024 0750   CALCIUM  9.9 02/16/2024 0750   PROT 6.6 02/01/2024 2334   ALBUMIN 3.9 02/01/2024 2334   AST 25 02/01/2024 2334   ALT 22 02/01/2024 2334   ALKPHOS 102 02/01/2024 2334   BILITOT <0.2 02/01/2024 2334   GFRNONAA >60 02/16/2024 0750   GFRAA >60 08/23/2014 0858    Assessment and Plan:   Raymond Mcclure is a 59 y.o. y/o male returns for follow-up of:  1.  GERD with esophagitis - Not controlled on PPI - Stop Pantoprazole  - Increase Omeprazole  to 40mg  twice daily. - GERD diet - Schedule EGD to screen for Barrett's  2.  Dysphagia: Recent barium swallow test showed mild esophageal dysmotility.  No evidence of esophageal stricture or masses. - Scheduling EGD with Dilation to evaluate persistent dysphagia I discussed risks of EGD with Dilaiton with patient to include risk of bleeding, perforation, and risk of sedation.  Patient expressed understanding and agrees to proceed with EGD.    3.  Rectal bleeding / abnormal CT imaging of the cecum: Recent fecal calprotectin was normal.  No evidence of bowel  inflammation.  Previous colonoscopy in 2020 showed benign polyps and fair prep.  - Scheduling Colonoscopy I discussed risks of colonoscopy with patient to include risk of bleeding, colon perforation, and risk of sedation.  Patient expressed understanding and agrees to proceed with colonoscopy.  - 2-day prep instructions given.  4.  Comorbidities:  Bipolar disorder, SVT s/p ablation, HTN, HLD, Sleep Apnea on CPAP, polysubstance abuse, GERD, CHF with preserved ejection fraction. - He had recent cardiac evaluation for chest pain including cardiac cath and echocardiogram. - Has received cardiac clearance for procedures and is acceptable risk for EGD and colonoscopy. - Patient reports stopping breathing when he is sedated.  He is requesting to have his procedures done in hospital for this reason. - We Scheduled EGD and Colon in Hospital.    Raymond Console, PA-C  Follow up as needed based on procedure results and GI symptoms.   "

## 2024-02-20 ENCOUNTER — Other Ambulatory Visit (HOSPITAL_COMMUNITY): Payer: Medicare (Managed Care)

## 2024-02-20 ENCOUNTER — Encounter (HOSPITAL_COMMUNITY): Payer: Self-pay

## 2024-02-21 ENCOUNTER — Ambulatory Visit: Payer: Medicare (Managed Care) | Admitting: Physician Assistant

## 2024-02-21 ENCOUNTER — Ambulatory Visit (INDEPENDENT_AMBULATORY_CARE_PROVIDER_SITE_OTHER): Payer: Medicare (Managed Care) | Admitting: Licensed Clinical Social Worker

## 2024-02-21 ENCOUNTER — Other Ambulatory Visit: Payer: Self-pay

## 2024-02-21 ENCOUNTER — Other Ambulatory Visit (HOSPITAL_COMMUNITY): Payer: Self-pay

## 2024-02-21 ENCOUNTER — Encounter: Payer: Self-pay | Admitting: Physician Assistant

## 2024-02-21 VITALS — BP 130/80 | HR 85 | Ht 70.0 in | Wt 270.0 lb

## 2024-02-21 DIAGNOSIS — R933 Abnormal findings on diagnostic imaging of other parts of digestive tract: Secondary | ICD-10-CM

## 2024-02-21 DIAGNOSIS — R1310 Dysphagia, unspecified: Secondary | ICD-10-CM

## 2024-02-21 DIAGNOSIS — K2100 Gastro-esophageal reflux disease with esophagitis, without bleeding: Secondary | ICD-10-CM

## 2024-02-21 DIAGNOSIS — F1420 Cocaine dependence, uncomplicated: Secondary | ICD-10-CM | POA: Diagnosis not present

## 2024-02-21 DIAGNOSIS — F1020 Alcohol dependence, uncomplicated: Secondary | ICD-10-CM

## 2024-02-21 DIAGNOSIS — F251 Schizoaffective disorder, depressive type: Secondary | ICD-10-CM

## 2024-02-21 DIAGNOSIS — F17200 Nicotine dependence, unspecified, uncomplicated: Secondary | ICD-10-CM

## 2024-02-21 DIAGNOSIS — K625 Hemorrhage of anus and rectum: Secondary | ICD-10-CM

## 2024-02-21 DIAGNOSIS — F1590 Other stimulant use, unspecified, uncomplicated: Secondary | ICD-10-CM

## 2024-02-21 MED ORDER — NA SULFATE-K SULFATE-MG SULF 17.5-3.13-1.6 GM/177ML PO SOLN
1.0000 | Freq: Once | ORAL | 0 refills | Status: AC
  Filled 2024-02-21 (×2): qty 354, 1d supply, fill #0

## 2024-02-21 MED ORDER — OMEPRAZOLE 40 MG PO CPDR
40.0000 mg | DELAYED_RELEASE_CAPSULE | Freq: Two times a day (BID) | ORAL | 3 refills | Status: DC
  Filled 2024-02-21: qty 180, 90d supply, fill #0
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
## 2024-02-21 NOTE — Progress Notes (Signed)
 Note in error.  Zell Maier, MA, LCSW, [facility], LCAS 02/21/2024

## 2024-02-21 NOTE — Patient Instructions (Signed)
 Increase Omeprazole  to 40 mg twice a day.  GERD diet handout given, please read over the information.  You have been scheduled for an endoscopy and colonoscopy. Please follow the written instructions given to you at your visit today.  If you use inhalers (even only as needed), please bring them with you on the day of your procedure.  DO NOT TAKE 7 DAYS PRIOR TO TEST- Trulicity (dulaglutide) Ozempic, Wegovy (semaglutide) Mounjaro, Zepbound (tirzepatide) Bydureon Bcise (exanatide extended release)  DO NOT TAKE 1 DAY PRIOR TO YOUR TEST Rybelsus (semaglutide) Adlyxin (lixisenatide) Victoza (liraglutide) Byetta (exanatide) ___________________________________________________________________________  Thank you for trusting me with your gastrointestinal care!   Ellouise Console, PA-C  _______________________________________________________  If your blood pressure at your visit was 140/90 or greater, please contact your primary care physician to follow up on this.  _______________________________________________________  If you are age 35 or older, your body mass index should be between 23-30. Your Body mass index is 38.74 kg/m. If this is out of the aforementioned range listed, please consider follow up with your Primary Care Provider.  If you are age 54 or younger, your body mass index should be between 19-25. Your Body mass index is 38.74 kg/m. If this is out of the aformentioned range listed, please consider follow up with your Primary Care Provider.   ________________________________________________________  The [facility] providers would like to encourage you to use MYCHART to communicate with providers for non-urgent requests or questions.  Due to long hold times on the telephone, sending your provider a message by [facility] may be a faster and more efficient way to get a response.  Please allow 48 business hours for a response.  Please remember that this is for non-urgent requests.   _______________________________________________________  [facility] is using a team-based approach to care.  Your team is made up of your doctor and two to three APPS. Our APPS (Nurse Practitioners and Physician Assistants) work with your physician to ensure care continuity for you. They are fully qualified to address your health concerns and develop a treatment plan. They communicate directly with your gastroenterologist to care for you. Seeing the Advanced Practice Practitioners on your physician's team can help you by facilitating care more promptly, often allowing for earlier appointments, access to diagnostic testing, procedures, and other specialty referrals.

## 2024-02-22 ENCOUNTER — Other Ambulatory Visit: Payer: Self-pay

## 2024-02-22 ENCOUNTER — Other Ambulatory Visit (HOSPITAL_COMMUNITY): Payer: Self-pay | Admitting: Medical

## 2024-02-22 ENCOUNTER — Other Ambulatory Visit

## 2024-02-22 ENCOUNTER — Other Ambulatory Visit (HOSPITAL_COMMUNITY): Payer: Self-pay

## 2024-02-22 ENCOUNTER — Ambulatory Visit (INDEPENDENT_AMBULATORY_CARE_PROVIDER_SITE_OTHER): Payer: Medicare (Managed Care)

## 2024-02-22 DIAGNOSIS — F14229 Cocaine dependence with intoxication, unspecified: Secondary | ICD-10-CM

## 2024-02-22 DIAGNOSIS — F1020 Alcohol dependence, uncomplicated: Secondary | ICD-10-CM

## 2024-02-22 DIAGNOSIS — F251 Schizoaffective disorder, depressive type: Secondary | ICD-10-CM

## 2024-02-22 DIAGNOSIS — Z006 Encounter for examination for normal comparison and control in clinical research program: Secondary | ICD-10-CM

## 2024-02-22 DIAGNOSIS — F14288 Cocaine dependence with other cocaine-induced disorder: Secondary | ICD-10-CM

## 2024-02-22 DIAGNOSIS — K2100 Gastro-esophageal reflux disease with esophagitis, without bleeding: Secondary | ICD-10-CM

## 2024-02-22 DIAGNOSIS — Z9889 Other specified postprocedural states: Secondary | ICD-10-CM

## 2024-02-22 DIAGNOSIS — I10 Essential (primary) hypertension: Secondary | ICD-10-CM

## 2024-02-22 DIAGNOSIS — F17200 Nicotine dependence, unspecified, uncomplicated: Secondary | ICD-10-CM

## 2024-02-22 DIAGNOSIS — G4733 Obstructive sleep apnea (adult) (pediatric): Secondary | ICD-10-CM

## 2024-02-22 DIAGNOSIS — Z638 Other specified problems related to primary support group: Secondary | ICD-10-CM

## 2024-02-22 DIAGNOSIS — F1420 Cocaine dependence, uncomplicated: Secondary | ICD-10-CM | POA: Diagnosis not present

## 2024-02-22 DIAGNOSIS — E669 Obesity, unspecified: Secondary | ICD-10-CM

## 2024-02-22 DIAGNOSIS — F14922 Cocaine use, unspecified with intoxication with perceptual disturbance: Secondary | ICD-10-CM

## 2024-02-22 DIAGNOSIS — F4320 Adjustment disorder, unspecified: Secondary | ICD-10-CM

## 2024-02-22 DIAGNOSIS — G4709 Other insomnia: Secondary | ICD-10-CM

## 2024-02-22 MED ORDER — BACLOFEN 10 MG PO TABS
10.0000 mg | ORAL_TABLET | Freq: Three times a day (TID) | ORAL | 0 refills | Status: AC
  Filled 2024-02-22: qty 180, 60d supply, fill #0

## 2024-02-22 NOTE — Progress Notes (Signed)
 Daily Group Progress Note   Program: [facility] IOP     Group Time: 9 a.m. to 12 p.m.      Type of Therapy: Process and Psychoeducational    Topic: The therapists checks in with group members, assesses for SI/HI/psychosis and overall level of functioning. The therapist inquires about sobriety date and number of community support meetings attended since last session.   The therapists introduces the new group member. In response to the group asking learning new information affects their sobriety, this therapist discusses the process of building new neuronal pathways in the brain. Therapist discusses how anxiety is related to substance use, integrating learning about PAWS, grounding techniques to lessen anxiety that brings the focus from the future to the present where irrational thoughts can be challenged and new behavior adapted in their routine.  Summary: This is Raymond Mcclure's first group. He reports his sobriety date as August 2025. Raymond Mcclure shares his story with the group. He notes he was born in [place] and then moved to [place]. He has also lived in [place] in his younger years. Raymond Mcclure discusses  how his father left when Raymond Mcclure was 7. He discusses his development and how he started using when he was young. He told about drinking moonshine as a youngster as he went to the refrigerator thirsty and did not realize what it was but did like it.   He shares his difficulties with drinking alcohol, smoking weed and using cocaine.  He elaborates on how close he came to death a couple of time and his suicide attempts. He shares his multiple medical issues. Raymond Mcclure discussses with group that he was pronounced dead at one point but was alive and survived. Raymond Mcclure shares he wants a new life and wants to start attending AA.  He shares with the group that he has twins in [place] and he intends to find them at some juncture.      Progress Towards Goals: reported sobriety since August 2025  UDS collected: No Results: None   AA/NA  attended?:  Yes   Sponsor?:  Yes   Darice Simpler, MS, LMFT, LCAS [address], [place], [place], [facility], LCAS 02/22/2024

## 2024-02-24 ENCOUNTER — Other Ambulatory Visit (HOSPITAL_COMMUNITY): Payer: Self-pay | Admitting: Medical

## 2024-02-24 ENCOUNTER — Other Ambulatory Visit: Payer: Self-pay

## 2024-02-24 ENCOUNTER — Other Ambulatory Visit (HOSPITAL_COMMUNITY): Payer: Self-pay

## 2024-02-24 ENCOUNTER — Ambulatory Visit (INDEPENDENT_AMBULATORY_CARE_PROVIDER_SITE_OTHER): Payer: Medicare (Managed Care) | Admitting: Licensed Clinical Social Worker

## 2024-02-24 DIAGNOSIS — F1020 Alcohol dependence, uncomplicated: Secondary | ICD-10-CM

## 2024-02-24 DIAGNOSIS — F17200 Nicotine dependence, unspecified, uncomplicated: Secondary | ICD-10-CM

## 2024-02-24 DIAGNOSIS — F1420 Cocaine dependence, uncomplicated: Secondary | ICD-10-CM | POA: Diagnosis not present

## 2024-02-24 DIAGNOSIS — F251 Schizoaffective disorder, depressive type: Secondary | ICD-10-CM

## 2024-02-24 DIAGNOSIS — F1590 Other stimulant use, unspecified, uncomplicated: Secondary | ICD-10-CM

## 2024-02-24 MED ORDER — VARENICLINE TARTRATE(CONTINUE) 1 MG PO TABS
1.0000 | ORAL_TABLET | Freq: Two times a day (BID) | ORAL | 6 refills | Status: AC
  Filled 2024-02-24: qty 14, 7d supply, fill #0
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
## 2024-02-25 ENCOUNTER — Ambulatory Visit: Payer: Self-pay | Admitting: Cardiology

## 2024-02-27 ENCOUNTER — Ambulatory Visit (HOSPITAL_COMMUNITY): Payer: Medicare (Managed Care) | Admitting: Licensed Clinical Social Worker

## 2024-02-27 DIAGNOSIS — F1020 Alcohol dependence, uncomplicated: Secondary | ICD-10-CM

## 2024-02-27 DIAGNOSIS — F17200 Nicotine dependence, unspecified, uncomplicated: Secondary | ICD-10-CM

## 2024-02-27 DIAGNOSIS — F251 Schizoaffective disorder, depressive type: Secondary | ICD-10-CM

## 2024-02-27 DIAGNOSIS — F1420 Cocaine dependence, uncomplicated: Secondary | ICD-10-CM

## 2024-02-27 NOTE — Progress Notes (Signed)
" ° °  Daily Group Progress Note   Program: [facility] IOP     Group Time: 9 a.m. to 11:25 a.m..    Virtual Visit via Video Note   ?   I connected with Raymond Mcclure at 9 a.m. EST?by a video enabled telemedicine application and verified that I am speaking with the correct person using two identifiers.   Location:   Patient:?home   Provider:?[place], [place]  ?   I discussed the limitations of evaluation and management by telemedicine and the availability of in person appointments. The patient expressed understanding and agreed to proceed.   Type of Therapy: Process and Psychoeducational    Topic: The therapists check in with group members, assess for SI/HI/psychosis and overall level of functioning. The therapists inquire about sobriety date and number of community support meetings attended since last session.   The therapists talk about PAWS and way to reduce the severity and intensity of it and the fact that PAWS can lead to feelings of boredom which in turn can be a trigger for relapse. The therapists discuss the reason that the steps are worked in the order that they are and the need for persons in recovery to be patient and to remember easy does it. The therapists begin to introduce group members to the stages or recovery pointing out that many people in early recovery assume they are running a sprint when in reality, they are running a marathon. The therapists remind group members that if they focus on not using one day at a time that things will start to improve from that one act alone.    Summary: Raymond Mcclure presents today saying that he has now attended 6 meetings as a result of being introduced to how to access virtual meetings when he was last in group.  He describes his mood as happy and excited.  He rates his depression today as a 0 and his anxiety as a 2 but says that his depression was around a 10 yesterday as a result of being stuck inside with his grandchildren.  Raymond Mcclure admits that in the past  he was trying to end his life by using cocaine as he felt like less of a man as he was unable to support his family due to his heart issues and other health concerns.  Now, he says that he wants to live to be over 59 years old and recognizes the positive impact that his presence can have in his grandchildren's lives.  The therapist and other group members reinforce that what most people want from their family is positive attention as opposed to material things.  After the group comes back from break, Raymond Mcclure says that he needs to leave group as he is feeling dizzy.  He says that he took his blood pressure earlier this morning and that it was 175/112.  He took some blood pressure medication but has not rechecked it.  The therapist recommends that Raymond Mcclure might want to put a call into his primary care doctor about his blood pressure if it does not come down to normal limits.   Progress Towards Goals: Raymond Mcclure reports no change in his sobriety date.    UDS collected: No   Results: No   AA/NA attended?:  Yes   Sponsor?:  No  Elsie Maier, MA, [place], [facility], LCAS Darice Simpler, MS, LMFT, LCAS 02/27/2024      "

## 2024-02-28 LAB — GENECONNECT MOLECULAR SCREEN: Genetic Analysis Overall Interpretation: NEGATIVE

## 2024-02-29 ENCOUNTER — Telehealth (HOSPITAL_COMMUNITY): Payer: Self-pay | Admitting: Licensed Clinical Social Worker

## 2024-02-29 ENCOUNTER — Ambulatory Visit (HOSPITAL_COMMUNITY): Payer: Medicare (Managed Care)
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
## 2024-03-02 ENCOUNTER — Emergency Department (HOSPITAL_COMMUNITY)
Admission: EM | Admit: 2024-03-02 | Discharge: 2024-03-02 | Disposition: A | Discharge status: Home / Self Care | Payer: Medicare (Managed Care) | Attending: Emergency Medicine | Admitting: Emergency Medicine

## 2024-03-02 ENCOUNTER — Other Ambulatory Visit: Payer: Self-pay

## 2024-03-02 ENCOUNTER — Encounter (HOSPITAL_COMMUNITY): Payer: Self-pay | Admitting: Licensed Clinical Social Worker

## 2024-03-02 ENCOUNTER — Telehealth (HOSPITAL_COMMUNITY): Payer: Self-pay | Admitting: Licensed Clinical Social Worker

## 2024-03-02 ENCOUNTER — Ambulatory Visit (INDEPENDENT_AMBULATORY_CARE_PROVIDER_SITE_OTHER): Payer: Medicare (Managed Care)

## 2024-03-02 ENCOUNTER — Emergency Department (HOSPITAL_COMMUNITY): Payer: Medicare (Managed Care)

## 2024-03-02 DIAGNOSIS — F251 Schizoaffective disorder, depressive type: Secondary | ICD-10-CM

## 2024-03-02 DIAGNOSIS — F1590 Other stimulant use, unspecified, uncomplicated: Secondary | ICD-10-CM

## 2024-03-02 DIAGNOSIS — H5213 Myopia, bilateral: Secondary | ICD-10-CM | POA: Insufficient documentation

## 2024-03-02 DIAGNOSIS — Z79899 Other long term (current) drug therapy: Secondary | ICD-10-CM | POA: Insufficient documentation

## 2024-03-02 DIAGNOSIS — F1420 Cocaine dependence, uncomplicated: Secondary | ICD-10-CM

## 2024-03-02 DIAGNOSIS — R0789 Other chest pain: Secondary | ICD-10-CM | POA: Diagnosis not present

## 2024-03-02 DIAGNOSIS — F1020 Alcohol dependence, uncomplicated: Secondary | ICD-10-CM

## 2024-03-02 DIAGNOSIS — Z91040 Latex allergy status: Secondary | ICD-10-CM | POA: Insufficient documentation

## 2024-03-02 DIAGNOSIS — I10 Essential (primary) hypertension: Secondary | ICD-10-CM | POA: Diagnosis not present

## 2024-03-02 DIAGNOSIS — Z91010 Allergy to peanuts: Secondary | ICD-10-CM | POA: Diagnosis not present

## 2024-03-02 DIAGNOSIS — R079 Chest pain, unspecified: Secondary | ICD-10-CM | POA: Diagnosis present

## 2024-03-02 DIAGNOSIS — F17200 Nicotine dependence, unspecified, uncomplicated: Secondary | ICD-10-CM

## 2024-03-02 DIAGNOSIS — Z7982 Long term (current) use of aspirin: Secondary | ICD-10-CM | POA: Insufficient documentation

## 2024-03-02 LAB — COMPREHENSIVE METABOLIC PANEL WITH GFR
ALT: 18 U/L (ref 0–44)
AST: 25 U/L (ref 15–41)
Albumin: 4.1 g/dL (ref 3.5–5.0)
Alkaline Phosphatase: 111 U/L (ref 38–126)
Anion gap: 9 (ref 5–15)
BUN: 19 mg/dL (ref 6–20)
CO2: 23 mmol/L (ref 22–32)
Calcium: 9.6 mg/dL (ref 8.9–10.3)
Chloride: 107 mmol/L (ref 98–111)
Creatinine, Ser: 1.04 mg/dL (ref 0.61–1.24)
GFR, Estimated: >60 mL/min
Glucose, Bld: 104 mg/dL — ABNORMAL HIGH (ref 70–99)
Potassium: 4.6 mmol/L (ref 3.5–5.1)
Sodium: 139 mmol/L (ref 135–145)
Total Bilirubin: 0.2 mg/dL (ref 0.0–1.2)
Total Protein: 7 g/dL (ref 6.5–8.1)

## 2024-03-02 LAB — URINALYSIS, ROUTINE W REFLEX MICROSCOPIC
Bacteria, UA: NONE SEEN
Bilirubin Urine: NEGATIVE
Glucose, UA: NEGATIVE mg/dL
Hgb urine dipstick: NEGATIVE
Ketones, ur: NEGATIVE mg/dL
Leukocytes,Ua: NEGATIVE
Nitrite: NEGATIVE
Protein, ur: NEGATIVE mg/dL
Specific Gravity, Urine: 1.018 (ref 1.005–1.030)
pH: 5 (ref 5.0–8.0)

## 2024-03-02 LAB — CBC
HCT: 42.7 % (ref 39.0–52.0)
Hemoglobin: 15 g/dL (ref 13.0–17.0)
MCH: 32.8 pg (ref 26.0–34.0)
MCHC: 35.1 g/dL (ref 30.0–36.0)
MCV: 93.2 fL (ref 80.0–100.0)
Platelets: 233 10*3/uL (ref 150–400)
RBC: 4.58 MIL/uL (ref 4.22–5.81)
RDW: 12.5 % (ref 11.5–15.5)
WBC: 10.1 10*3/uL (ref 4.0–10.5)
nRBC: 0 % (ref 0.0–0.2)

## 2024-03-02 LAB — TROPONIN T, HIGH SENSITIVITY
Troponin T High Sensitivity: <6 ng/L (ref 0–19)
Troponin T High Sensitivity: 8 ng/L (ref 0–19)

## 2024-03-02 LAB — URINE DRUG SCREEN
Amphetamines: NEGATIVE
Barbiturates: NEGATIVE
Benzodiazepines: NEGATIVE
Cocaine: NEGATIVE
Fentanyl: NEGATIVE
Methadone Scn, Ur: NEGATIVE
Opiates: NEGATIVE
Tetrahydrocannabinol: NEGATIVE

## 2024-03-02 LAB — LIPASE, BLOOD: Lipase: 34 U/L (ref 11–51)

## 2024-03-02 MED ORDER — LABETALOL HCL 5 MG/ML IV SOLN
10.0000 mg | Freq: Once | INTRAVENOUS | Status: AC
  Administered 2024-03-02: 10 mg via INTRAVENOUS
  Filled 2024-03-02: qty 4

## 2024-03-02 MED ORDER — ALUM & MAG HYDROXIDE-SIMETH 200-200-20 MG/5ML PO SUSP
30.0000 mL | Freq: Once | ORAL | Status: AC
  Administered 2024-03-02: 30 mL via ORAL
  Filled 2024-03-02: qty 30

## 2024-03-02 NOTE — Progress Notes (Signed)
 Daily Group Progress Note   Program: [facility] IOP     Group Time: 9 a.m. to 12 p.m.      Type of Therapy: Process and Psychoeducational    Topic: The therapists check in with group members, assess for SI/HI/psychosis and overall level of functioning. The therapists inquire about sobriety date and number of community support meetings attended since last session.   The therapists explain the biology of addiction and normalize grieving the loss of certain parts of ones disease. The therapists discuss issues involved in dealing with difficult family situations while in early recovery and the role of assertiveness modeling how this is done. The therapists explain the differences between enmeshed versus disengaged families and illustration how transactional analysis can be used to evaluate interpersonal interactions pointing out that adult to adult interactions are preferred. The therapists discuss the games people play in dysfunctional family systems. The therapists point out that one cannot have the temporary relief addiction affords without the chaos that comes with it recommending people play the story forward.   Summary: Raymond Mcclure presents today and rates his anxiety as a 0 and his depression as a 5.  Raymond Mcclure is quiet in group today but appears to be listening. He asks the nurse to take his blood pressure which was 168/91.  Raymond Mcclure then said he was going to leave to go the doctor.     Progress Towards Goals: Raymond Mcclure reports no change in his sobriety date.    UDS collected: No   Results: None  AA/NA attended?:  No   Sponsor?:   Darice Simpler, MS, LMFT, LCAS [address], [place], [place], [facility], LCAS 03/02/2024

## 2024-03-02 NOTE — Discharge Instructions (Signed)
 Pick up your amlodipine  that was sent to your pharmacy.

## 2024-03-02 NOTE — ED Triage Notes (Signed)
 Blurry vision x last night (10pm), pt reports vision got worse while in class  Chest pain left sided, at 10 pm a well with dizziness.

## 2024-03-02 NOTE — ED Provider Notes (Signed)
 " [place] EMERGENCY DEPARTMENT AT [facility] Provider Note   CSN: [id number] Arrival date & time: 03/02/24  1103     Patient presents with: No chief complaint on file.   Raymond Mcclure is a 59 y.o. male.   Pt is a 59 yo male with pmhx significant for htn, hld, depression, gerd, IBS, and polysubstance abuse.  Pt was in class at [facility] for substance abuse this am.  He felt some left sided cp and some blurry vision.  He said it is better now, but is still there.  He had some last night as well, but he went to sleep and it was gone when he first got up.  He reports medication compliance and avoidance of cocaine.       Prior to Admission medications  Medication Sig Start Date End Date Taking? Authorizing Provider  amLODipine  (NORVASC ) 5 MG tablet Take 1 tablet (5 mg total) by mouth every morning. 02/13/24  Yes   aspirin  EC 81 MG tablet Take 1 tablet (81 mg total) by mouth daily. Swallow whole. 11/22/23 11/21/24 Yes Zheng, Michael, DO  baclofen  (LIORESAL ) 10 MG tablet Take 1 tablet (10 mg total) by mouth 3 (three) times daily. 02/22/24 04/22/24  Leila Carlin BRAVO, PA-C  atorvastatin  (LIPITOR) 40 MG tablet Take 1 tablet (40 mg total) by mouth every morning. 02/13/24     escitalopram  (LEXAPRO ) 10 MG tablet Take 1 tablet (10 mg total) by mouth every morning. 02/13/24     fluticasone  furoate-vilanterol (BREO ELLIPTA ) 100-25 MCG/ACT AEPB Inhale 1 puff into the lungs daily. 11/17/23   Zheng, Michael, DO  furosemide  (LASIX ) 20 MG tablet Take 1 tablet (20 mg total) by mouth every morning. 02/13/24     losartan  (COZAAR ) 50 MG tablet Take 1 tablet (50 mg total) by mouth every morning. 02/13/24     naltrexone  (DEPADE) 50 MG tablet Take 1 tablet (50 mg total) by mouth daily. 02/13/24   Izella Ismael NOVAK, MD  naltrexone  (DEPADE) 50 MG tablet Take 1 tablet (50 mg total) by mouth daily. 02/13/24     nitroGLYCERIN  (NITROSTAT ) 0.4 MG SL tablet Place 1 tablet (0.4 mg total) under the tongue every 5 (five)  minutes as needed for chest pain (Up to three times). 01/03/24 04/02/24  Tobb, Kardie, DO  ondansetron  (ZOFRAN -ODT) 4 MG disintegrating tablet Take 1 tablet (4 mg total) by mouth every 8 (eight) hours as needed for nausea or vomiting. 11/22/23   Zheng, Michael, DO  risperiDONE  (RISPERDAL ) 1 MG tablet Take 1 tablet (1 mg total) by mouth 2 (two) times daily. 02/13/24   Izella Ismael NOVAK, MD  Varenicline  Tartrate,Continue, 1 MG TABS Take 1 tablet by mouth in the morning and at bedtime. Patient not taking: Reported on 03/02/2024 02/24/24 05/18/24  Kober, Charles E, PA-C    Allergies: Bee venom, Peanut-containing drug products, Covid-19 (mrna) vaccine, and Latex    Review of Systems  Eyes:  Positive for visual disturbance.  Cardiovascular:  Positive for chest pain.  All other systems reviewed and are negative.   Updated Vital Signs BP (!) 169/98   Pulse 75   Temp 98.4 F (36.9 C) (Oral)   Resp 20   SpO2 99%   Physical Exam Vitals and nursing note reviewed.  Constitutional:      Appearance: Normal appearance.  HENT:     Head: Normocephalic and atraumatic.     Right Ear: External ear normal.     Left Ear: External ear normal.  Nose: Nose normal.     Mouth/Throat:     Mouth: Mucous membranes are moist.     Pharynx: Oropharynx is clear.  Eyes:     Extraocular Movements: Extraocular movements intact.     Conjunctiva/sclera: Conjunctivae normal.     Pupils: Pupils are equal, round, and reactive to light.  Cardiovascular:     Rate and Rhythm: Normal rate.     Pulses: Normal pulses.  Pulmonary:     Effort: Pulmonary effort is normal.     Breath sounds: Normal breath sounds.  Abdominal:     General: Abdomen is flat. Bowel sounds are normal.     Palpations: Abdomen is soft.  Musculoskeletal:        General: Normal range of motion.     Cervical back: Normal range of motion and neck supple.  Skin:    General: Skin is warm.     Capillary Refill: Capillary refill takes less than 2  seconds.  Neurological:     General: No focal deficit present.     Mental Status: He is alert and oriented to person, place, and time.  Psychiatric:        Mood and Affect: Mood normal.        Behavior: Behavior normal.     (all labs ordered are listed, but only abnormal results are displayed) Labs Reviewed  COMPREHENSIVE METABOLIC PANEL WITH GFR - Abnormal; Notable for the following components:      Result Value   Glucose, Bld 104 (*)    All other components within normal limits  CBC  LIPASE, BLOOD  URINE DRUG SCREEN  URINALYSIS, ROUTINE W REFLEX MICROSCOPIC  TROPONIN T, HIGH SENSITIVITY  TROPONIN T, HIGH SENSITIVITY    EKG: EKG Interpretation Date/Time:  Friday March 02 2024 11:14:51 EST Ventricular Rate:  87 PR Interval:  165 QRS Duration:  78 QT Interval:  337 QTC Calculation: 406 R Axis:   49  Text Interpretation: Sinus rhythm Consider left atrial enlargement Borderline abnrm T, anterolateral leads No significant change since last tracing Confirmed by Dean Clarity [phone]) on 03/02/2024 12:41:03 PM  Radiology: [facility] Chest Port 1 View Result Date: 03/02/2024 CLINICAL DATA:  Chest pain. EXAM: PORTABLE CHEST 1 VIEW COMPARISON:  02/16/2024 FINDINGS: Again noted is mild elevation of the right hemidiaphragm. There may be mild atelectasis at the right lung base. Otherwise, the lungs are clear. Heart size is normal. Trachea is midline. Negative for a pneumothorax. No acute bone abnormality. IMPRESSION: No acute chest abnormality. Electronically Signed   By: Juliene Balder M.D.   On: 03/02/2024 14:13     Procedures   Medications Ordered in the ED  alum & mag hydroxide-simeth (MAALOX/MYLANTA) 200-200-20 MG/5ML suspension 30 mL (30 mLs Oral Given 03/02/24 1303)  labetalol  (NORMODYNE ) injection 10 mg (10 mg Intravenous Given 03/02/24 1302)                                    Medical Decision Making Amount and/or Complexity of Data Reviewed Labs: ordered. Radiology:  ordered.  Risk OTC drugs. Prescription drug management.   This patient presents to the ED for concern of cp, this involves an extensive number of treatment options, and is a complaint that carries with it a high risk of complications and morbidity.  The differential diagnosis includes cardiac, pulm, gi, msk, drug use   Co morbidities that complicate the patient evaluation  htn, hld, depression, gerd,  IBS, and polysubstance abuse   Additional history obtained:  Additional history obtained from epic chart review   Lab Tests:  I Ordered, and personally interpreted labs.  The pertinent results include:  cbc nl, cmp nl, lip nl, trop neg times 2, ua neg, uds neg   Imaging Studies ordered:  I ordered imaging studies including cxr  I independently visualized and interpreted imaging which showed No acute chest abnormality.  I agree with the radiologist interpretation   Cardiac Monitoring:  The patient was maintained on a cardiac monitor.  I personally viewed and interpreted the cardiac monitored which showed an underlying rhythm of: nsr   Medicines ordered and prescription drug management:  I ordered medication including maalox/labetalol   for sx  Reevaluation of the patient after these medicines showed that the patient improved I have reviewed the patients home medicines and have made adjustments as needed   Problem List / ED Course:  Visual disturbance:  initial visual acuity done w/o his glasses.  With glasses, r is 20/70, left 20/50; bilateral 20/50.  Pt given the number to the ophthalmologist. HTN:  improved after meds.  Pt has a rx for amlodipine  that was called in on 1/12, but he has not picked it up.  He knows to pick them up.   Hx cocaine use:  no cocaine use today.  He said he's been clean for his grand kids.  He is motivated to not use.   Reevaluation:  After the interventions noted above, I reevaluated the patient and found that they have :improved   Social  Determinants of Health:  Lives at home   Dispostion:  After consideration of the diagnostic results and the patients response to treatment, I feel that the patent would benefit from discharge with outpatient f/u.       Final diagnoses:  Atypical chest pain  Hypertension, unspecified type  Myopia of both eyes    ED Discharge Orders     None          Dean Clarity, MD 03/02/24 1511  "

## 2024-03-02 NOTE — Telephone Encounter (Signed)
 The therapist calls Raymond Mcclure confirming his identity via two identifiers. He says that he is at [facility] at the moment and that have told him that his stomach issues might be impacting his heart.  The therapist wishes him well and explains that the plan at present is for group to meet on Monday, 03/05/24, from 10 a.m. to 1 p.m. but this is subject to change due to the weather.  Zell Maier, MA, LCSW, [facility], LCAS 03/02/2024

## 2024-03-04 ENCOUNTER — Encounter (HOSPITAL_COMMUNITY): Payer: Self-pay | Admitting: Licensed Clinical Social Worker

## 2024-03-05 ENCOUNTER — Encounter (HOSPITAL_COMMUNITY): Payer: Self-pay | Admitting: Medical

## 2024-03-05 ENCOUNTER — Ambulatory Visit (HOSPITAL_COMMUNITY): Payer: Medicare (Managed Care) | Admitting: Licensed Clinical Social Worker

## 2024-03-05 DIAGNOSIS — F17200 Nicotine dependence, unspecified, uncomplicated: Secondary | ICD-10-CM

## 2024-03-05 DIAGNOSIS — F1590 Other stimulant use, unspecified, uncomplicated: Secondary | ICD-10-CM

## 2024-03-05 DIAGNOSIS — F251 Schizoaffective disorder, depressive type: Secondary | ICD-10-CM

## 2024-03-05 DIAGNOSIS — F1420 Cocaine dependence, uncomplicated: Secondary | ICD-10-CM

## 2024-03-05 DIAGNOSIS — F1020 Alcohol dependence, uncomplicated: Secondary | ICD-10-CM

## 2024-03-05 NOTE — Progress Notes (Signed)
 Daily Group Progress Note   Program: [facility] IOP     Group Time: 9 a.m. to 12 p.m.    Virtual Visit via Video Note    ?    I connected with Raymond Mcclure at 9 a.m. EST?by a video enabled telemedicine application and verified that I am speaking with the correct person using two identifiers.    Location:    Patient:?home    Provider:?[place], [place]   ?    I discussed the limitations of evaluation and management by telemedicine and the availability of in person appointments. The patient expressed understanding and agreed to proceed.    Type of Therapy: Process and Psychoeducational    Topic: The therapists check in with group members, assess for SI/HI/psychosis and overall level of functioning. The therapists inquire about sobriety date and number of community support meetings attended since last session.   The therapists had group members complete and discuss the following items from the Matrix Model; 1C 2A Recovery Check List, 1C 2B Relapse Analysis Chart, and 1C 3A Treatment Evaluation. The therapists show them the short video, Alternate [place] Rear in an effort to help them see the futility of should of, would of, could of as it relates to their past substance use in relation to where they are now in their lives.    Summary: [name] presents today saying that his mood is miserable as he has been sick all weekend with some sort of respiratory ailment. He says that he found out that there was another blood pressure medication that he was supposed to be taking so needs to get this medicine picked up.  He continues to attend virtual meeting saying that he enjoys them. A guy at one of these meetings told [name] that he could serve as a Armed Forces Operational Officer and is supposed to call him.  Raymond Mcclure states that he does not have much contact with family or friends saying that he prefers to do stuff alone as this prevents people from being able to say that he did things that he did not do. He concludes that family is  bad for recovery.  He does say that one thing that he has done is that he no longer holds onto money and that his daughter is his payee. 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 Also, he says that he would not mind being able to go back to school to finish is Bachelor's Degree in Business lacking three credits to graduate.   Raymond Mcclure says that what might prevent him doing school currently is that he gets stressed too quickly. Lastly, he talks about wanting to get back to listening to [place] music which relaxes him.      Progress Towards Goals: Raymond Mcclure reports no change in his sobriety date.    UDS collected: No   Results: No   AA/NA attended?:  Yes   Sponsor?:  No   Elsie Maier, MA, [place], [facility], LCAS Darice Simpler, MS, LMFT, LCAS 03/05/2024

## 2024-03-06 ENCOUNTER — Telehealth (HOSPITAL_COMMUNITY): Payer: Self-pay

## 2024-03-06 NOTE — Telephone Encounter (Signed)
 Raymond Mcclure calls and says that his MD from his hospital discharge appointment has been moved from next week to this Friday 03-09-23 and they will be picking him up at 8:30 so he won't be coming to [facility] IOP this Friday.  Darice Simpler, MS, LMFT, LCAS  03-06-24

## 2024-03-07 ENCOUNTER — Ambulatory Visit (HOSPITAL_COMMUNITY): Payer: Medicare (Managed Care)

## 2024-03-07 ENCOUNTER — Other Ambulatory Visit (HOSPITAL_COMMUNITY): Payer: Self-pay

## 2024-03-07 ENCOUNTER — Other Ambulatory Visit: Payer: Self-pay

## 2024-03-07 DIAGNOSIS — F14288 Cocaine dependence with other cocaine-induced disorder: Secondary | ICD-10-CM | POA: Insufficient documentation

## 2024-03-07 DIAGNOSIS — F4320 Adjustment disorder, unspecified: Secondary | ICD-10-CM | POA: Insufficient documentation

## 2024-03-07 MED ORDER — MYLANTA MAXIMUM STRENGTH 400-400-40 MG/5ML PO SUSP
10.0000 mL | Freq: Three times a day (TID) | ORAL | 0 refills | Status: AC
  Filled 2024-03-07 – 2024-03-08 (×4): qty 355, 5d supply, fill #0

## 2024-03-07 MED ORDER — AMLODIPINE BESYLATE 5 MG PO TABS
5.0000 mg | ORAL_TABLET | Freq: Every morning | ORAL | 3 refills | Status: AC
  Filled 2024-03-07 – 2024-03-08 (×4): qty 90, 90d supply, fill #0

## 2024-03-07 MED ORDER — GABAPENTIN 300 MG PO CAPS
300.0000 mg | ORAL_CAPSULE | Freq: Three times a day (TID) | ORAL | 0 refills | Status: AC | PRN
  Filled 2024-03-07 – 2024-03-08 (×4): qty 90, 30d supply, fill #0

## 2024-03-08 ENCOUNTER — Other Ambulatory Visit: Payer: Self-pay | Admitting: Student

## 2024-03-08 ENCOUNTER — Other Ambulatory Visit: Payer: Self-pay

## 2024-03-09 ENCOUNTER — Telehealth (HOSPITAL_COMMUNITY): Payer: Self-pay | Admitting: Licensed Clinical Social Worker

## 2024-03-09 ENCOUNTER — Ambulatory Visit (HOSPITAL_COMMUNITY): Payer: Medicare (Managed Care)

## 2024-03-09 ENCOUNTER — Other Ambulatory Visit: Payer: Self-pay

## 2024-03-09 NOTE — Telephone Encounter (Signed)
 The therapist calls Raymond Mcclure confirming his identity via two identifiers. He says that he has not in group as he is sick with a cold as are his grandchildren. If he cannot make group on Monday, then he is encouraged to leave a voicemail on the therapists' confidential voicemail box.    Zell Maier, MA, LCSW, [facility], LCAS 03/09/2024

## 2024-03-12 ENCOUNTER — Ambulatory Visit (HOSPITAL_COMMUNITY): Payer: Medicare (Managed Care)

## 2024-03-14 ENCOUNTER — Ambulatory Visit (HOSPITAL_COMMUNITY): Payer: Medicare (Managed Care)

## 2024-03-16 ENCOUNTER — Ambulatory Visit (HOSPITAL_COMMUNITY): Payer: Medicare (Managed Care)

## 2024-03-19 ENCOUNTER — Ambulatory Visit (HOSPITAL_COMMUNITY): Payer: Medicare (Managed Care)

## 2024-03-21 ENCOUNTER — Ambulatory Visit (HOSPITAL_COMMUNITY): Payer: Medicare (Managed Care)

## 2024-03-23 ENCOUNTER — Ambulatory Visit (HOSPITAL_COMMUNITY): Payer: Medicare (Managed Care)

## 2024-03-28 ENCOUNTER — Encounter: Admitting: Pulmonary Disease

## 2024-04-02 ENCOUNTER — Ambulatory Visit (HOSPITAL_COMMUNITY): Payer: Medicare (Managed Care) | Admitting: Psychiatry

## 2024-04-04 ENCOUNTER — Ambulatory Visit: Payer: Self-pay

## 2024-04-26 ENCOUNTER — Encounter (HOSPITAL_COMMUNITY): Payer: Self-pay

## 2024-04-26 ENCOUNTER — Ambulatory Visit: Admitting: Cardiology

## 2024-04-26 ENCOUNTER — Ambulatory Visit (HOSPITAL_COMMUNITY): Admit: 2024-04-26 | Payer: Medicare (Managed Care) | Admitting: Internal Medicine

## 2024-04-26 SURGERY — COLONOSCOPY
Anesthesia: Monitor Anesthesia Care

## 2024-05-16 ENCOUNTER — Ambulatory Visit: Payer: Medicare (Managed Care) | Admitting: Cardiology
